# Patient Record
Sex: Male | Born: 1940
Health system: Southern US, Community
[De-identification: ages and names within clinical notes are randomized; demographics above are authoritative.]

## PROBLEM LIST (undated history)

## (undated) ENCOUNTER — Emergency Department (HOSPITAL_COMMUNITY): Payer: Medicare Other

## (undated) DIAGNOSIS — E119 Type 2 diabetes mellitus without complications: Secondary | ICD-10-CM

## (undated) DIAGNOSIS — I255 Ischemic cardiomyopathy: Secondary | ICD-10-CM

## (undated) DIAGNOSIS — Z951 Presence of aortocoronary bypass graft: Secondary | ICD-10-CM

## (undated) DIAGNOSIS — E785 Hyperlipidemia, unspecified: Secondary | ICD-10-CM

## (undated) DIAGNOSIS — I513 Intracardiac thrombosis, not elsewhere classified: Secondary | ICD-10-CM

## (undated) DIAGNOSIS — J449 Chronic obstructive pulmonary disease, unspecified: Secondary | ICD-10-CM

## (undated) DIAGNOSIS — I70229 Atherosclerosis of native arteries of extremities with rest pain, unspecified extremity: Secondary | ICD-10-CM

## (undated) DIAGNOSIS — I639 Cerebral infarction, unspecified: Secondary | ICD-10-CM

## (undated) DIAGNOSIS — I10 Essential (primary) hypertension: Secondary | ICD-10-CM

## (undated) DIAGNOSIS — D696 Thrombocytopenia, unspecified: Secondary | ICD-10-CM

## (undated) DIAGNOSIS — N200 Calculus of kidney: Secondary | ICD-10-CM

## (undated) DIAGNOSIS — I6529 Occlusion and stenosis of unspecified carotid artery: Secondary | ICD-10-CM

## (undated) DIAGNOSIS — I998 Other disorder of circulatory system: Secondary | ICD-10-CM

## (undated) DIAGNOSIS — R319 Hematuria, unspecified: Secondary | ICD-10-CM

## (undated) DIAGNOSIS — I236 Thrombosis of atrium, auricular appendage, and ventricle as current complications following acute myocardial infarction: Secondary | ICD-10-CM

## (undated) DIAGNOSIS — I2510 Atherosclerotic heart disease of native coronary artery without angina pectoris: Secondary | ICD-10-CM

## (undated) DIAGNOSIS — Z9889 Other specified postprocedural states: Secondary | ICD-10-CM

## (undated) DIAGNOSIS — I4891 Unspecified atrial fibrillation: Secondary | ICD-10-CM

## (undated) HISTORY — DX: Unspecified atrial fibrillation: I48.91

## (undated) HISTORY — DX: Presence of aortocoronary bypass graft: Z95.1

## (undated) HISTORY — DX: Occlusion and stenosis of unspecified carotid artery: I65.29

## (undated) HISTORY — DX: Atherosclerotic heart disease of native coronary artery without angina pectoris: I25.10

## (undated) HISTORY — DX: Atherosclerosis of native arteries of extremities with rest pain, unspecified extremity: I70.229

## (undated) HISTORY — PX: REPAIR THORACIC AORTA: SUR1211

## (undated) HISTORY — DX: Thrombocytopenia, unspecified: D69.6

## (undated) HISTORY — DX: Other disorder of circulatory system: I99.8

## (undated) HISTORY — DX: Chronic obstructive pulmonary disease, unspecified: J44.9

## (undated) HISTORY — DX: Hyperlipidemia, unspecified: E78.5

## (undated) HISTORY — DX: Ischemic cardiomyopathy: I25.5

## (undated) HISTORY — DX: Intracardiac thrombosis, not elsewhere classified: I51.3

---

## 1997-11-20 ENCOUNTER — Inpatient Hospital Stay (HOSPITAL_COMMUNITY)
Admission: AD | Admit: 1997-11-20 | Discharge: 1997-12-04 | Payer: Self-pay | Admitting: Physical Medicine & Rehabilitation

## 2000-01-01 ENCOUNTER — Encounter: Payer: Self-pay | Admitting: Emergency Medicine

## 2000-01-01 ENCOUNTER — Emergency Department (HOSPITAL_COMMUNITY): Admission: EM | Admit: 2000-01-01 | Discharge: 2000-01-01 | Payer: Self-pay | Admitting: Emergency Medicine

## 2004-05-01 ENCOUNTER — Emergency Department (HOSPITAL_COMMUNITY): Admission: EM | Admit: 2004-05-01 | Discharge: 2004-05-02 | Payer: Self-pay | Admitting: Emergency Medicine

## 2005-03-05 ENCOUNTER — Emergency Department (HOSPITAL_COMMUNITY): Admission: EM | Admit: 2005-03-05 | Discharge: 2005-03-05 | Payer: Self-pay | Admitting: Emergency Medicine

## 2007-04-10 ENCOUNTER — Emergency Department: Payer: Self-pay | Admitting: Emergency Medicine

## 2011-02-24 ENCOUNTER — Emergency Department (HOSPITAL_COMMUNITY): Payer: Medicare Other

## 2011-02-24 ENCOUNTER — Emergency Department (HOSPITAL_COMMUNITY)
Admission: EM | Admit: 2011-02-24 | Discharge: 2011-02-24 | Disposition: A | Discharge status: Home / Self Care | Payer: Medicare Other | Attending: Emergency Medicine | Admitting: Emergency Medicine

## 2011-02-24 DIAGNOSIS — R10819 Abdominal tenderness, unspecified site: Secondary | ICD-10-CM | POA: Insufficient documentation

## 2011-02-24 DIAGNOSIS — Z8673 Personal history of transient ischemic attack (TIA), and cerebral infarction without residual deficits: Secondary | ICD-10-CM | POA: Insufficient documentation

## 2011-02-24 DIAGNOSIS — R112 Nausea with vomiting, unspecified: Secondary | ICD-10-CM | POA: Insufficient documentation

## 2011-02-24 DIAGNOSIS — R109 Unspecified abdominal pain: Secondary | ICD-10-CM | POA: Insufficient documentation

## 2011-02-24 DIAGNOSIS — K209 Esophagitis, unspecified without bleeding: Secondary | ICD-10-CM | POA: Insufficient documentation

## 2011-02-24 DIAGNOSIS — N200 Calculus of kidney: Secondary | ICD-10-CM | POA: Insufficient documentation

## 2011-02-24 LAB — DIFFERENTIAL
Basophils Absolute: 0 K/uL (ref 0.0–0.1)
Basophils Relative: 1 % (ref 0–1)
Eosinophils Absolute: 0.3 10*3/uL (ref 0.0–0.7)
Eosinophils Relative: 3 % (ref 0–5)
Lymphocytes Relative: 22 % (ref 12–46)
Lymphs Abs: 2 10*3/uL (ref 0.7–4.0)
Monocytes Absolute: 0.5 K/uL (ref 0.1–1.0)
Monocytes Relative: 6 % (ref 3–12)
Neutro Abs: 5.9 10*3/uL (ref 1.7–7.7)
Neutrophils Relative %: 68 % (ref 43–77)

## 2011-02-24 LAB — LIPASE, BLOOD: Lipase: 34 U/L (ref 11–59)

## 2011-02-24 LAB — COMPREHENSIVE METABOLIC PANEL
ALT: 24 U/L (ref 0–53)
AST: 23 U/L (ref 0–37)
Alkaline Phosphatase: 81 U/L (ref 39–117)
CO2: 27 mEq/L (ref 19–32)
Calcium: 9.8 mg/dL (ref 8.4–10.5)
GFR calc Af Amer: >60 mL/min (ref >60)
GFR calc non Af Amer: >60 mL/min (ref >60)
Glucose, Bld: 105 mg/dL — ABNORMAL HIGH (ref 70–99)
Potassium: 4.5 mEq/L (ref 3.5–5.1)
Sodium: 138 mEq/L (ref 135–145)

## 2011-02-24 LAB — CBC
HCT: 46.1 % (ref 39.0–52.0)
Hemoglobin: 16.6 g/dL (ref 13.0–17.0)
MCH: 33.9 pg (ref 26.0–34.0)
MCHC: 36 g/dL (ref 30.0–36.0)
MCV: 94.3 fL (ref 78.0–100.0)
Platelets: 158 10*3/uL (ref 150–400)
RBC: 4.89 MIL/uL (ref 4.22–5.81)
RDW: 13.5 % (ref 11.5–15.5)
WBC: 8.7 10*3/uL (ref 4.0–10.5)

## 2011-02-24 LAB — URINALYSIS, ROUTINE W REFLEX MICROSCOPIC
Glucose, UA: NEGATIVE mg/dL
Ketones, ur: NEGATIVE mg/dL
Protein, ur: >300 mg/dL — AB
Urobilinogen, UA: 0.2 mg/dL (ref 0.0–1.0)

## 2011-02-24 LAB — COMPREHENSIVE METABOLIC PANEL WITH GFR
Albumin: 4.2 g/dL (ref 3.5–5.2)
BUN: 19 mg/dL (ref 6–23)
Chloride: 99 meq/L (ref 96–112)
Creatinine, Ser: 1.17 mg/dL (ref 0.50–1.35)
Total Bilirubin: 0.6 mg/dL (ref 0.3–1.2)
Total Protein: 7.3 g/dL (ref 6.0–8.3)

## 2011-02-24 LAB — URINE MICROSCOPIC-ADD ON

## 2011-02-24 LAB — PROTIME-INR
INR: 1.01 (ref 0.00–1.49)
Prothrombin Time: 13.5 s (ref 11.6–15.2)

## 2011-02-24 LAB — APTT: aPTT: 22 seconds — ABNORMAL LOW (ref 24–37)

## 2011-02-24 MED ORDER — IOHEXOL 300 MG/ML  SOLN
80.0000 mL | Freq: Once | INTRAMUSCULAR | Status: AC | PRN
  Administered 2011-02-24: 80 mL via INTRAVENOUS

## 2012-01-13 ENCOUNTER — Emergency Department (HOSPITAL_COMMUNITY): Payer: Medicare Other

## 2012-01-13 ENCOUNTER — Encounter (HOSPITAL_COMMUNITY): Payer: Self-pay | Admitting: Emergency Medicine

## 2012-01-13 ENCOUNTER — Inpatient Hospital Stay (HOSPITAL_COMMUNITY)
Admission: EM | Admit: 2012-01-13 | Discharge: 2012-01-29 | DRG: 234 | Disposition: A | Discharge status: Skilled Nursing Facility | Payer: Medicare Other | Attending: Cardiothoracic Surgery | Admitting: Cardiothoracic Surgery

## 2012-01-13 DIAGNOSIS — I214 Non-ST elevation (NSTEMI) myocardial infarction: Principal | ICD-10-CM

## 2012-01-13 DIAGNOSIS — N200 Calculus of kidney: Secondary | ICD-10-CM | POA: Diagnosis present

## 2012-01-13 DIAGNOSIS — R1013 Epigastric pain: Secondary | ICD-10-CM | POA: Diagnosis not present

## 2012-01-13 DIAGNOSIS — Z720 Tobacco use: Secondary | ICD-10-CM

## 2012-01-13 DIAGNOSIS — Z951 Presence of aortocoronary bypass graft: Secondary | ICD-10-CM

## 2012-01-13 DIAGNOSIS — E875 Hyperkalemia: Secondary | ICD-10-CM | POA: Diagnosis present

## 2012-01-13 DIAGNOSIS — R079 Chest pain, unspecified: Secondary | ICD-10-CM | POA: Diagnosis not present

## 2012-01-13 DIAGNOSIS — Z9889 Other specified postprocedural states: Secondary | ICD-10-CM

## 2012-01-13 DIAGNOSIS — Z7982 Long term (current) use of aspirin: Secondary | ICD-10-CM

## 2012-01-13 DIAGNOSIS — I236 Thrombosis of atrium, auricular appendage, and ventricle as current complications following acute myocardial infarction: Secondary | ICD-10-CM | POA: Diagnosis present

## 2012-01-13 DIAGNOSIS — I255 Ischemic cardiomyopathy: Secondary | ICD-10-CM | POA: Diagnosis present

## 2012-01-13 DIAGNOSIS — I658 Occlusion and stenosis of other precerebral arteries: Secondary | ICD-10-CM | POA: Diagnosis present

## 2012-01-13 DIAGNOSIS — Z8673 Personal history of transient ischemic attack (TIA), and cerebral infarction without residual deficits: Secondary | ICD-10-CM

## 2012-01-13 DIAGNOSIS — J4489 Other specified chronic obstructive pulmonary disease: Secondary | ICD-10-CM | POA: Diagnosis present

## 2012-01-13 DIAGNOSIS — D696 Thrombocytopenia, unspecified: Secondary | ICD-10-CM | POA: Diagnosis present

## 2012-01-13 DIAGNOSIS — Z23 Encounter for immunization: Secondary | ICD-10-CM

## 2012-01-13 DIAGNOSIS — I10 Essential (primary) hypertension: Secondary | ICD-10-CM | POA: Diagnosis not present

## 2012-01-13 DIAGNOSIS — F17200 Nicotine dependence, unspecified, uncomplicated: Secondary | ICD-10-CM | POA: Diagnosis present

## 2012-01-13 DIAGNOSIS — N289 Disorder of kidney and ureter, unspecified: Secondary | ICD-10-CM | POA: Diagnosis present

## 2012-01-13 DIAGNOSIS — R7989 Other specified abnormal findings of blood chemistry: Secondary | ICD-10-CM | POA: Diagnosis not present

## 2012-01-13 DIAGNOSIS — R739 Hyperglycemia, unspecified: Secondary | ICD-10-CM

## 2012-01-13 DIAGNOSIS — I6350 Cerebral infarction due to unspecified occlusion or stenosis of unspecified cerebral artery: Secondary | ICD-10-CM | POA: Diagnosis not present

## 2012-01-13 DIAGNOSIS — I2510 Atherosclerotic heart disease of native coronary artery without angina pectoris: Secondary | ICD-10-CM

## 2012-01-13 DIAGNOSIS — J449 Chronic obstructive pulmonary disease, unspecified: Secondary | ICD-10-CM | POA: Diagnosis present

## 2012-01-13 DIAGNOSIS — E119 Type 2 diabetes mellitus without complications: Secondary | ICD-10-CM | POA: Diagnosis present

## 2012-01-13 DIAGNOSIS — I2589 Other forms of chronic ischemic heart disease: Secondary | ICD-10-CM | POA: Diagnosis present

## 2012-01-13 DIAGNOSIS — R5381 Other malaise: Secondary | ICD-10-CM | POA: Diagnosis present

## 2012-01-13 DIAGNOSIS — I6529 Occlusion and stenosis of unspecified carotid artery: Secondary | ICD-10-CM | POA: Diagnosis present

## 2012-01-13 DIAGNOSIS — J811 Chronic pulmonary edema: Secondary | ICD-10-CM | POA: Diagnosis not present

## 2012-01-13 HISTORY — DX: Type 2 diabetes mellitus without complications: E11.9

## 2012-01-13 HISTORY — DX: Hematuria, unspecified: R31.9

## 2012-01-13 HISTORY — DX: Cerebral infarction, unspecified: I63.9

## 2012-01-13 HISTORY — DX: Essential (primary) hypertension: I10

## 2012-01-13 HISTORY — DX: Calculus of kidney: N20.0

## 2012-01-13 HISTORY — DX: Other specified postprocedural states: Z98.890

## 2012-01-13 HISTORY — PX: CAROTID ENDARTERECTOMY: SUR193

## 2012-01-13 HISTORY — DX: Thrombosis of atrium, auricular appendage, and ventricle as current complications following acute myocardial infarction: I23.6

## 2012-01-13 LAB — COMPREHENSIVE METABOLIC PANEL
Alkaline Phosphatase: 74 U/L (ref 39–117)
BUN: 18 mg/dL (ref 6–23)
Chloride: 103 mEq/L (ref 96–112)
Creatinine, Ser: 1.09 mg/dL (ref 0.50–1.35)
GFR calc Af Amer: 77 mL/min — ABNORMAL LOW (ref >90)
Glucose, Bld: 157 mg/dL — ABNORMAL HIGH (ref 70–99)
Potassium: 4.1 mEq/L (ref 3.5–5.1)
Total Bilirubin: 0.4 mg/dL (ref 0.3–1.2)
Total Protein: 7.4 g/dL (ref 6.0–8.3)

## 2012-01-13 LAB — CBC
Platelets: 143 10*3/uL — ABNORMAL LOW (ref 150–400)
RBC: 4.68 MIL/uL (ref 4.22–5.81)
RDW: 14 % (ref 11.5–15.5)
WBC: 7.4 10*3/uL (ref 4.0–10.5)

## 2012-01-13 LAB — POCT I-STAT TROPONIN I

## 2012-01-13 MED ORDER — HEPARIN (PORCINE) IN NACL 100-0.45 UNIT/ML-% IJ SOLN
1200.0000 [IU]/h | INTRAMUSCULAR | Status: DC
  Administered 2012-01-13: 700 [IU]/h via INTRAVENOUS
  Administered 2012-01-14: 1000 [IU]/h via INTRAVENOUS
  Filled 2012-01-13 (×3): qty 250

## 2012-01-13 MED ORDER — NITROGLYCERIN 0.4 MG SL SUBL
0.4000 mg | SUBLINGUAL_TABLET | SUBLINGUAL | Status: DC | PRN
  Administered 2012-01-13 – 2012-01-14 (×2): 0.4 mg via SUBLINGUAL
  Filled 2012-01-13: qty 25

## 2012-01-13 MED ORDER — HEPARIN BOLUS VIA INFUSION
3000.0000 [IU] | Freq: Once | INTRAVENOUS | Status: AC
  Administered 2012-01-13: 3000 [IU] via INTRAVENOUS

## 2012-01-13 NOTE — ED Notes (Addendum)
Patient complaining of intermittent chest pain that started three days ago; reports shortness of breath.  Denies nausea, vomiting, diaphoresis, and other associated symptoms.  Patient reports a "heartburn" type feeling in the left side of his chest and up his esophogeal area. Patient reports taking four aspirin tablets (patient unsure of dose of each).  Patient has history of recurrent angina; states that taking aspirin usually takes his pain away.

## 2012-01-13 NOTE — Progress Notes (Signed)
ANTICOAGULATION CONSULT NOTE - Initial Consult  Pharmacy Consult for heparin Indication: chest pain/ACS  No Known Allergies  Patient Measurements: Height: 5\' 6"  (167.6 cm) Weight: 130 lb (58.968 kg) (Pt estimate) IBW/kg (Calculated) : 63.8   Vital Signs: Temp: 98.5 F (36.9 C) (07/20 1954) BP: 152/90 mmHg (07/20 1954) Pulse Rate: 102  (07/20 1954)  Labs:  Basename 01/13/12 2030  HGB 15.7  HCT 44.0  PLT 143*  APTT --  LABPROT --  INR --  HEPARINUNFRC --  CREATININE 1.09  CKTOTAL --  CKMB --  TROPONINI --    Estimated Creatinine Clearance: 52.6 ml/min (by C-G formula based on Cr of 1.09).   Medical History: Past Medical History  Diagnosis Date  . Hypertension   . Diabetes mellitus   . H/O ascending aorta repair   . Stroke     Assessment: 71yo male c/o on-and-off CP x3d, worse today, with i-stat troponin mildly elevated, to begin heparin.  Goal of Therapy:  Heparin level 0.3-0.7 units/ml Monitor platelets by anticoagulation protocol: Yes   Plan:  Will give heparin bolus of 3000 units x1 followed by gtt at 700 units/hr and monitor heparin levels and CBC.  Colleen Can PharmD BCPS 01/13/2012,10:44 PM

## 2012-01-13 NOTE — ED Provider Notes (Signed)
History     CSN: [id number]  Arrival date & time 01/13/12  1951   First MD Initiated Contact with Patient 01/13/12 2051      Chief Complaint  Patient presents with  . Chest Pain    (Consider location/radiation/quality/duration/timing/severity/associated sxs/prior treatment) Patient is a 71 y.o. male presenting with chest pain. The history is provided by the patient and a relative.  Chest Pain The chest pain began 3 - 5 days ago. Chest pain occurs intermittently. The chest pain is worsening. At its most intense, the pain is at 9/10. The pain is currently at 5/10. The quality of the pain is described as burning. The pain does not radiate. Primary symptoms include abdominal pain. Pertinent negatives for primary symptoms include no fever, no fatigue, no shortness of breath, no cough, no palpitations, no nausea and no vomiting.  Pertinent negatives for associated symptoms include no claudication, no lower extremity edema, no near-syncope and no weakness. He tried aspirin and antacids for the symptoms. Risk factors include male gender, being elderly and smoking/tobacco exposure.  His past medical history is significant for aneurysm, diabetes and hypertension.  Pertinent negatives for past medical history include no CAD and no MI.     Past Medical History  Diagnosis Date  . Hypertension   . Diabetes mellitus   . H/O ascending aorta repair   . Stroke     Past Surgical History  Procedure Date  . Repair thoracic aorta     History reviewed. No pertinent family history.  History  Substance Use Topics  . Smoking status: Current Everyday Smoker -- 1.0 packs/day  . Smokeless tobacco: Not on file  . Alcohol Use: No      Review of Systems  Unable to perform ROS Constitutional: Negative for fever, chills and fatigue.  Respiratory: Negative for cough and shortness of breath.   Cardiovascular: Positive for chest pain. Negative for palpitations, claudication and near-syncope.    Gastrointestinal: Positive for abdominal pain. Negative for nausea and vomiting.  Musculoskeletal: Negative for myalgias and arthralgias.  Skin: Negative for color change and rash.  Neurological: Negative for weakness, light-headedness and headaches.  All other systems reviewed and are negative.    Allergies  Review of patient's allergies indicates no known allergies.  Home Medications   Current Outpatient Rx  Name Route Sig Dispense Refill  . ASPIRIN 325 MG PO TABS Oral Take 325 mg by mouth daily.      BP 124/74  Pulse 80  Temp 98.5 F (36.9 C)  Resp 22  Ht 5\' 6"  (1.676 m)  Wt 130 lb (58.968 kg)  BMI 20.98 kg/m2  SpO2 97%  Physical Exam  Nursing note and vitals reviewed. Constitutional: He is oriented to person, place, and time. He appears well-developed and well-nourished.  HENT:  Head: Normocephalic and atraumatic.  Eyes: EOM are normal. Pupils are equal, round, and reactive to light.  Cardiovascular: Normal rate and regular rhythm.   Pulmonary/Chest: Effort normal and breath sounds normal. No respiratory distress. He exhibits no tenderness.  Abdominal: Soft. There is tenderness (moderate, epigastric).  Musculoskeletal: He exhibits no edema.  Neurological: He is alert and oriented to person, place, and time.  Skin: Skin is warm and dry.  Psychiatric: He has a normal mood and affect.    ED Course  Procedures (including critical care time)  Labs Reviewed  COMPREHENSIVE METABOLIC PANEL - Abnormal; Notable for the following:    Glucose, Bld 157 (*)     GFR calc non Af  Amer 67 (*)     GFR calc Af Amer 77 (*)     All other components within normal limits  CBC - Abnormal; Notable for the following:    Platelets 143 (*)     All other components within normal limits  POCT I-STAT TROPONIN I - Abnormal; Notable for the following:    Troponin i, poc 0.12 (*)     All other components within normal limits  HEPARIN LEVEL (UNFRACTIONATED)  CBC  CARDIAC PANEL(CRET  KIN+CKTOT+MB+TROPI)   Dg Chest 2 View  01/13/2012  *RADIOLOGY REPORT*  Clinical Data: Chest pain today.  Hypertension, diabetes.  History of smoking, stroke.  CHEST - 2 VIEW  Comparison: 02/24/2011  Findings: Heart is mildly enlarged.  There is increased interstitial prominence, most consistent with interstitial edema. No focal consolidations or pleural effusions are identified. Degenerative changes are seen in the spine.  IMPRESSION: Cardiomegaly and interstitial pulmonary edema.  Original Report Authenticated By: Patterson Hammersmith, M.D.    Date: 01/13/2012  Rate: 101  Rhythm: sinus tachycardia  QRS Axis: normal  Intervals: normal  ST/T Wave abnormalities: nonspecific ST changes and nonspecific T wave changes mild ST depression in II, V6, TWI II, V5-V6  Conduction Disutrbances:none  Narrative Interpretation:   Old EKG Reviewed: changes noted new nonspecific ST and T wave changes (02/23/11)    1. NSTEMI (non-ST elevated myocardial infarction)   2. Elevated troponin   3. Epigastric pain       MDM  This is a 71 year old male who presents today with to 3 days of intermittent chest pain. When asked the location of the pain, the patient points to his epigastric and periumbilical area. He describes it as a burning pain, but occasionally radiates up into his throat, and has been relieved by both aspirin and over-the-counter antacids. He has not previously had similar pain before. He denies any shortness of breath, nausea or vomiting or any other associated complaints. These episodes last anywhere from several minutes to several hours, and do not always require medications to have them subside. He says that over the course of a couple of days the pain has worsened in intensity, and that day the pain was not relieved despite taking multiple aspirin; he took anywhere between 4 and 8 aspirin today of unknown strength. On exam the patient does have moderate epigastric tenderness, exam is otherwise  unremarkable. EKG shows nonspecific changes that are new from his previous EKG, and his troponin is mildly elevated. Discussed the patient with [facility] cardiology, and per their recommendations will admit the patient to the cardiology service, and will start heparin for anticoagulation. The patient and his son were updated with the findings and the plan of care. They expressed understanding.        Theotis Burrow, MD 01/14/12 [phone]

## 2012-01-13 NOTE — ED Notes (Signed)
Resident at bedside.  

## 2012-01-13 NOTE — ED Notes (Signed)
Pt states that he has been having CP since Wed. Or Thursday (unable to state exact day just a few days ago.) Pain is in lower chest/upper abdomen and feels like a burning pain. Pt states that he has had acid reflux in the past but no medications rxed. Pt states that he had a CVA in 1999, states that he had vision changes with CVA. Pt states a week ago also had similar vision changes while driving to PCP for check up. Pt denies vision changes at this time. Pt just has burning in upper central abdominal region.

## 2012-01-13 NOTE — ED Provider Notes (Signed)
I saw and evaluated the patient, reviewed the resident's note and I agree with the findings and plan.  Agree with EKG interpretation  Pt with chest pain off and on for several days, more severe today. Has positive Trop. Pain free now.   Charles B. Bernette Mayers, MD 01/13/12 2232

## 2012-01-14 ENCOUNTER — Encounter (HOSPITAL_COMMUNITY): Payer: Self-pay | Admitting: Internal Medicine

## 2012-01-14 DIAGNOSIS — J9819 Other pulmonary collapse: Secondary | ICD-10-CM | POA: Diagnosis not present

## 2012-01-14 DIAGNOSIS — R7309 Other abnormal glucose: Secondary | ICD-10-CM

## 2012-01-14 DIAGNOSIS — Z951 Presence of aortocoronary bypass graft: Secondary | ICD-10-CM | POA: Diagnosis not present

## 2012-01-14 DIAGNOSIS — I2589 Other forms of chronic ischemic heart disease: Secondary | ICD-10-CM | POA: Diagnosis present

## 2012-01-14 DIAGNOSIS — I6789 Other cerebrovascular disease: Secondary | ICD-10-CM | POA: Diagnosis not present

## 2012-01-14 DIAGNOSIS — I509 Heart failure, unspecified: Secondary | ICD-10-CM | POA: Diagnosis not present

## 2012-01-14 DIAGNOSIS — R0989 Other specified symptoms and signs involving the circulatory and respiratory systems: Secondary | ICD-10-CM | POA: Diagnosis not present

## 2012-01-14 DIAGNOSIS — Z01810 Encounter for preprocedural cardiovascular examination: Secondary | ICD-10-CM | POA: Diagnosis not present

## 2012-01-14 DIAGNOSIS — D696 Thrombocytopenia, unspecified: Secondary | ICD-10-CM | POA: Diagnosis present

## 2012-01-14 DIAGNOSIS — I442 Atrioventricular block, complete: Secondary | ICD-10-CM | POA: Diagnosis not present

## 2012-01-14 DIAGNOSIS — I214 Non-ST elevation (NSTEMI) myocardial infarction: Secondary | ICD-10-CM | POA: Diagnosis present

## 2012-01-14 DIAGNOSIS — J4489 Other specified chronic obstructive pulmonary disease: Secondary | ICD-10-CM | POA: Diagnosis not present

## 2012-01-14 DIAGNOSIS — I6529 Occlusion and stenosis of unspecified carotid artery: Secondary | ICD-10-CM | POA: Diagnosis not present

## 2012-01-14 DIAGNOSIS — R079 Chest pain, unspecified: Secondary | ICD-10-CM | POA: Diagnosis not present

## 2012-01-14 DIAGNOSIS — R739 Hyperglycemia, unspecified: Secondary | ICD-10-CM | POA: Insufficient documentation

## 2012-01-14 DIAGNOSIS — Z23 Encounter for immunization: Secondary | ICD-10-CM | POA: Diagnosis not present

## 2012-01-14 DIAGNOSIS — F17200 Nicotine dependence, unspecified, uncomplicated: Secondary | ICD-10-CM | POA: Diagnosis not present

## 2012-01-14 DIAGNOSIS — I428 Other cardiomyopathies: Secondary | ICD-10-CM | POA: Diagnosis not present

## 2012-01-14 DIAGNOSIS — R5381 Other malaise: Secondary | ICD-10-CM | POA: Diagnosis present

## 2012-01-14 DIAGNOSIS — I6509 Occlusion and stenosis of unspecified vertebral artery: Secondary | ICD-10-CM | POA: Diagnosis not present

## 2012-01-14 DIAGNOSIS — R1013 Epigastric pain: Secondary | ICD-10-CM | POA: Diagnosis not present

## 2012-01-14 DIAGNOSIS — R319 Hematuria, unspecified: Secondary | ICD-10-CM | POA: Diagnosis not present

## 2012-01-14 DIAGNOSIS — Z7982 Long term (current) use of aspirin: Secondary | ICD-10-CM | POA: Diagnosis not present

## 2012-01-14 DIAGNOSIS — I6350 Cerebral infarction due to unspecified occlusion or stenosis of unspecified cerebral artery: Secondary | ICD-10-CM | POA: Diagnosis not present

## 2012-01-14 DIAGNOSIS — J90 Pleural effusion, not elsewhere classified: Secondary | ICD-10-CM | POA: Diagnosis not present

## 2012-01-14 DIAGNOSIS — N289 Disorder of kidney and ureter, unspecified: Secondary | ICD-10-CM | POA: Diagnosis present

## 2012-01-14 DIAGNOSIS — N200 Calculus of kidney: Secondary | ICD-10-CM | POA: Diagnosis present

## 2012-01-14 DIAGNOSIS — I517 Cardiomegaly: Secondary | ICD-10-CM | POA: Diagnosis not present

## 2012-01-14 DIAGNOSIS — J8410 Pulmonary fibrosis, unspecified: Secondary | ICD-10-CM | POA: Diagnosis not present

## 2012-01-14 DIAGNOSIS — I2510 Atherosclerotic heart disease of native coronary artery without angina pectoris: Secondary | ICD-10-CM | POA: Diagnosis not present

## 2012-01-14 DIAGNOSIS — I10 Essential (primary) hypertension: Secondary | ICD-10-CM

## 2012-01-14 DIAGNOSIS — E119 Type 2 diabetes mellitus without complications: Secondary | ICD-10-CM | POA: Diagnosis not present

## 2012-01-14 DIAGNOSIS — E875 Hyperkalemia: Secondary | ICD-10-CM | POA: Diagnosis present

## 2012-01-14 DIAGNOSIS — I658 Occlusion and stenosis of other precerebral arteries: Secondary | ICD-10-CM | POA: Diagnosis not present

## 2012-01-14 DIAGNOSIS — I059 Rheumatic mitral valve disease, unspecified: Secondary | ICD-10-CM | POA: Diagnosis not present

## 2012-01-14 DIAGNOSIS — R109 Unspecified abdominal pain: Secondary | ICD-10-CM | POA: Diagnosis not present

## 2012-01-14 DIAGNOSIS — R918 Other nonspecific abnormal finding of lung field: Secondary | ICD-10-CM | POA: Diagnosis not present

## 2012-01-14 DIAGNOSIS — J984 Other disorders of lung: Secondary | ICD-10-CM | POA: Diagnosis not present

## 2012-01-14 DIAGNOSIS — R7989 Other specified abnormal findings of blood chemistry: Secondary | ICD-10-CM | POA: Diagnosis not present

## 2012-01-14 DIAGNOSIS — J811 Chronic pulmonary edema: Secondary | ICD-10-CM | POA: Diagnosis not present

## 2012-01-14 DIAGNOSIS — R310 Gross hematuria: Secondary | ICD-10-CM | POA: Diagnosis not present

## 2012-01-14 DIAGNOSIS — I359 Nonrheumatic aortic valve disorder, unspecified: Secondary | ICD-10-CM | POA: Diagnosis not present

## 2012-01-14 DIAGNOSIS — Z720 Tobacco use: Secondary | ICD-10-CM | POA: Diagnosis present

## 2012-01-14 DIAGNOSIS — J449 Chronic obstructive pulmonary disease, unspecified: Secondary | ICD-10-CM | POA: Diagnosis not present

## 2012-01-14 DIAGNOSIS — Z8673 Personal history of transient ischemic attack (TIA), and cerebral infarction without residual deficits: Secondary | ICD-10-CM | POA: Diagnosis not present

## 2012-01-14 LAB — BASIC METABOLIC PANEL
BUN: 15 mg/dL (ref 6–23)
CO2: 21 mEq/L (ref 19–32)
Chloride: 107 mEq/L (ref 96–112)
Creatinine, Ser: 1.12 mg/dL (ref 0.50–1.35)
Glucose, Bld: 128 mg/dL — ABNORMAL HIGH (ref 70–99)

## 2012-01-14 LAB — CBC
HCT: 40.3 % (ref 39.0–52.0)
MCH: 33.6 pg (ref 26.0–34.0)
MCHC: 35.5 g/dL (ref 30.0–36.0)
MCHC: 35.9 g/dL (ref 30.0–36.0)
MCV: 93.7 fL (ref 78.0–100.0)
Platelets: 128 10*3/uL — ABNORMAL LOW (ref 150–400)
RDW: 13.9 % (ref 11.5–15.5)
RDW: 13.9 % (ref 11.5–15.5)

## 2012-01-14 LAB — GLUCOSE, CAPILLARY: Glucose-Capillary: 112 mg/dL — ABNORMAL HIGH (ref 70–99)

## 2012-01-14 LAB — HEPARIN LEVEL (UNFRACTIONATED)
Heparin Unfractionated: 0.14 IU/mL — ABNORMAL LOW (ref 0.30–0.70)
Heparin Unfractionated: 0.21 IU/mL — ABNORMAL LOW (ref 0.30–0.70)

## 2012-01-14 LAB — CARDIAC PANEL(CRET KIN+CKTOT+MB+TROPI)
CK, MB: 9.3 ng/mL (ref 0.3–4.0)
Relative Index: 6.9 — ABNORMAL HIGH (ref 0.0–2.5)
Relative Index: 8.9 — ABNORMAL HIGH (ref 0.0–2.5)
Total CK: 112 U/L (ref 7–232)
Total CK: 105 U/L (ref 7–232)
Total CK: 80 U/L (ref 7–232)
Troponin I: 0.82 ng/mL (ref <0.30)

## 2012-01-14 LAB — LIPID PANEL
LDL Cholesterol: 62 mg/dL (ref 0–99)
Total CHOL/HDL Ratio: 4.7 RATIO
VLDL: 49 mg/dL — ABNORMAL HIGH (ref 0–40)

## 2012-01-14 MED ORDER — IPRATROPIUM BROMIDE 0.02 % IN SOLN
0.5000 mg | Freq: Four times a day (QID) | RESPIRATORY_TRACT | Status: DC
  Administered 2012-01-14 – 2012-01-15 (×5): 0.5 mg via RESPIRATORY_TRACT
  Filled 2012-01-14 (×5): qty 2.5

## 2012-01-14 MED ORDER — SODIUM CHLORIDE 0.9 % IV SOLN: 250.0000 mL | INTRAVENOUS | Status: DC | PRN

## 2012-01-14 MED ORDER — PNEUMOCOCCAL VAC POLYVALENT 25 MCG/0.5ML IJ INJ
0.5000 mL | INJECTION | INTRAMUSCULAR | Status: AC
  Administered 2012-01-15: 0.5 mL via INTRAMUSCULAR
  Filled 2012-01-14: qty 0.5

## 2012-01-14 MED ORDER — ONDANSETRON HCL 4 MG PO TABS: 4.0000 mg | ORAL_TABLET | Freq: Four times a day (QID) | ORAL | Status: DC | PRN

## 2012-01-14 MED ORDER — ASPIRIN 325 MG PO TABS
325.0000 mg | ORAL_TABLET | Freq: Every day | ORAL | Status: DC
  Administered 2012-01-14 – 2012-01-21 (×7): 325 mg via ORAL
  Filled 2012-01-14 (×9): qty 1

## 2012-01-14 MED ORDER — ONDANSETRON HCL 4 MG/2ML IJ SOLN: 4.0000 mg | Freq: Four times a day (QID) | INTRAMUSCULAR | Status: DC | PRN

## 2012-01-14 MED ORDER — ALBUTEROL SULFATE (5 MG/ML) 0.5% IN NEBU: 2.5000 mg | INHALATION_SOLUTION | RESPIRATORY_TRACT | Status: DC | PRN

## 2012-01-14 MED ORDER — CARVEDILOL 6.25 MG PO TABS
6.2500 mg | ORAL_TABLET | ORAL | Status: AC
  Administered 2012-01-14: 6.25 mg via ORAL
  Filled 2012-01-14: qty 1

## 2012-01-14 MED ORDER — SODIUM CHLORIDE 0.9 % IJ SOLN
3.0000 mL | Freq: Two times a day (BID) | INTRAMUSCULAR | Status: DC
  Administered 2012-01-14 – 2012-01-15 (×2): 3 mL via INTRAVENOUS

## 2012-01-14 MED ORDER — NITROGLYCERIN IN D5W 200-5 MCG/ML-% IV SOLN
2.0000 ug/min | INTRAVENOUS | Status: DC
  Administered 2012-01-14: 5 ug/min via INTRAVENOUS
  Filled 2012-01-14: qty 250

## 2012-01-14 MED ORDER — CARVEDILOL 6.25 MG PO TABS
6.2500 mg | ORAL_TABLET | Freq: Two times a day (BID) | ORAL | Status: DC
  Administered 2012-01-15 – 2012-01-21 (×12): 6.25 mg via ORAL
  Filled 2012-01-14 (×19): qty 1

## 2012-01-14 MED ORDER — NICOTINE 21 MG/24HR TD PT24
21.0000 mg | MEDICATED_PATCH | Freq: Every day | TRANSDERMAL | Status: DC
  Administered 2012-01-14 – 2012-01-21 (×8): 21 mg via TRANSDERMAL
  Filled 2012-01-14 (×9): qty 1

## 2012-01-14 MED ORDER — MORPHINE SULFATE 2 MG/ML IJ SOLN
2.0000 mg | INTRAMUSCULAR | Status: DC | PRN
  Administered 2012-01-17 – 2012-01-20 (×4): 2 mg via INTRAVENOUS
  Filled 2012-01-14 (×4): qty 1

## 2012-01-14 MED ORDER — ACETAMINOPHEN 650 MG RE SUPP: 650.0000 mg | Freq: Four times a day (QID) | RECTAL | Status: DC | PRN

## 2012-01-14 MED ORDER — ALBUTEROL SULFATE (5 MG/ML) 0.5% IN NEBU
2.5000 mg | INHALATION_SOLUTION | Freq: Four times a day (QID) | RESPIRATORY_TRACT | Status: DC
  Administered 2012-01-14 – 2012-01-15 (×5): 2.5 mg via RESPIRATORY_TRACT
  Filled 2012-01-14 (×5): qty 0.5

## 2012-01-14 MED ORDER — LISINOPRIL 2.5 MG PO TABS
2.5000 mg | ORAL_TABLET | Freq: Every day | ORAL | Status: DC
  Administered 2012-01-14 – 2012-01-15 (×2): 2.5 mg via ORAL
  Filled 2012-01-14 (×5): qty 1

## 2012-01-14 MED ORDER — HEPARIN BOLUS VIA INFUSION
2000.0000 [IU] | Freq: Once | INTRAVENOUS | Status: AC
  Administered 2012-01-14: 2000 [IU] via INTRAVENOUS
  Filled 2012-01-14: qty 2000

## 2012-01-14 MED ORDER — ACETAMINOPHEN 325 MG PO TABS
650.0000 mg | ORAL_TABLET | Freq: Four times a day (QID) | ORAL | Status: DC | PRN
  Administered 2012-01-15: 650 mg via ORAL
  Filled 2012-01-14: qty 2

## 2012-01-14 MED ORDER — SODIUM CHLORIDE 0.9 % IJ SOLN: 3.0000 mL | INTRAMUSCULAR | Status: DC | PRN

## 2012-01-14 NOTE — ED Notes (Signed)
Admitting MD at bedside. Stepdown orders being placed.

## 2012-01-14 NOTE — Consult Note (Signed)
[facility]       CONSULTATION NOTE   Reason for Consult: Unstable angina, probably postinfarction  Requesting Physician: Dr. Mikeal Hawthorne  Cardiologist: He has not seen a cardiologist in over 10 years  HPI: This is a 71 y.o. male with a past medical history significant for coronary artery disease, stroke and peripheral arterial disease. Reportedly he had a stent placed in the coronary arteries around the year 1999 at which time the ulcer problems with severe intermittent claudication and a recent stroke. At some point she underwent aortobiiliac bypass for peripheral field disease.  He has been sick now for at least 4 weeks, during which time he has experienced recurrent chest pain radiating to the upper abdomen, severe shortness of breath on exertion, near syncope on exertion. She also had some severe nausea and vomiting roughly 2 weeks ago, possibly with some hematemesis. He has refused to seek medical care despite his son is recommendation until his condition worsened over the last 24 hours. He has had severe chest discomfort today that is immediately relieved by sublingual nitroglycerin but  quickly recurs. After initiation of intravenous nitroglycerin drip he has been chest pain-free.  He has a very slight increase in his cardiac troponin I and CK-MB. His EKG is quite abnormal and suggestive of a completed interval myocardial infarction, new when compared with a previous EKG from August of 2012. There is also minor ST segment depression and T-wave inversion in the lateral leads.  As far as I can tell he was not taking any cardiac medications prior to this admission. He had tried taking some over-the-counter acid relief medicines without much benefit. She has not had any medical followup to speak of in many years. An emergency room visit last August was prompted by abdominal pain nausea and vomiting and then to the diagnosis of a left kidney staghorn calculus.  Incidental note was made at the time of occlusion of the left iliac limb of his aortofemoral bypass. PMHx:  See above  FAMHx: History reviewed. No pertinent family history.  SOCHx:  reports that he has been smoking.  He does not have any smokeless tobacco history on file. He reports that he does not drink alcohol or use illicit drugs.  ALLERGIES: No Known Allergies  ROS: Constitutional: positive for fatigue, negative for anorexia, chills, fevers and night sweats Ears, nose, mouth, throat, and face: negative Respiratory: positive for chronic bronchitis, cough, dyspnea on exertion, sputum and wheezing, negative for pleurisy/chest pain Cardiovascular: positive for chest pressure/discomfort and near-syncope, negative for orthopnea, palpitations, paroxysmal nocturnal dyspnea and syncope Gastrointestinal: positive for constipation, nausea and vomiting, negative for jaundice, melena and odynophagia Genitourinary:negative Integument/breast: negative Hematologic/lymphatic: negative for bleeding and easy bruising Musculoskeletal:positive for arthralgias and back pain Neurological: positive for weakness and residual right-sided weakness Behavioral/Psych: negative Endocrine: negative Allergic/Immunologic: negative  HOME MEDICATIONS: Prescriptions prior to admission  Medication Sig Dispense Refill  . aspirin 325 MG tablet Take 325 mg by mouth daily.        HOSPITAL MEDICATIONS: Prior to Admission:  Prescriptions prior to admission  Medication Sig Dispense Refill  . aspirin 325 MG tablet Take 325 mg by mouth daily.        VITALS: Blood pressure 124/70, pulse 83, temperature 98 F (36.7 C), temperature source Oral, resp. rate 20, height 5\' 6"  (1.676 m), weight 64.2 kg (141 lb 8.6 oz), SpO2 100.00%.  PHYSICAL EXAM: General appearance: drowsy but arousable Neck: no adenopathy, no JVD, supple,  symmetrical, trachea midline, thyroid not enlarged, symmetric, no tenderness/mass/nodules and  unable to hold his breath for exam but no obvious carotid bruits are heard Lungs: diminished breath sounds bilaterally and wheezes few scattered bilaterally Heart: normal apical impulse, regular rate and rhythm, S1, S2 normal, no S3 or S4 and very distant heart sounds Abdomen: soft, non-tender; bowel sounds normal; no masses,  no organomegaly Extremities: extremities normal, atraumatic, no cyanosis or edema Pulses: unable to palpate any pulses in the right or left foot but the feet are warm with good capillary refill; upper shoulder pulses are normal bilateral femoral bruits are present Skin: Skin color, texture, turgor normal. No rashes or lesions Neurologic: Motor: right-sided weakness 4/5  LABS: Results for orders placed during the hospital encounter of 01/13/12 (from the past 48 hour(s))  COMPREHENSIVE METABOLIC PANEL     Status: Abnormal   Collection Time   01/13/12  8:30 PM      Component Value Range Comment   Sodium 139  135 - 145 mEq/L    Potassium 4.1  3.5 - 5.1 mEq/L    Chloride 103  96 - 112 mEq/L    CO2 20  19 - 32 mEq/L    Glucose, Bld 157 (*) 70 - 99 mg/dL    BUN 18  6 - 23 mg/dL    Creatinine, Ser 0.98  0.50 - 1.35 mg/dL    Calcium 9.7  8.4 - 11.9 mg/dL    Total Protein 7.4  6.0 - 8.3 g/dL    Albumin 4.2  3.5 - 5.2 g/dL    AST 27  0 - 37 U/L HEMOLYSIS AT THIS LEVEL MAY AFFECT RESULT   ALT 14  0 - 53 U/L    Alkaline Phosphatase 74  39 - 117 U/L    Total Bilirubin 0.4  0.3 - 1.2 mg/dL    GFR calc non Af Amer 67 (*) >90 mL/min    GFR calc Af Amer 77 (*) >90 mL/min   CBC     Status: Abnormal   Collection Time   01/13/12  8:30 PM      Component Value Range Comment   WBC 7.4  4.0 - 10.5 K/uL    RBC 4.68  4.22 - 5.81 MIL/uL    Hemoglobin 15.7  13.0 - 17.0 g/dL    HCT 14.7  82.9 - 56.2 %    MCV 94.0  78.0 - 100.0 fL    MCH 33.5  26.0 - 34.0 pg    MCHC 35.7  30.0 - 36.0 g/dL    RDW 13.0  86.5 - 78.4 %    Platelets 143 (*) 150 - 400 K/uL   POCT I-STAT TROPONIN I      Status: Abnormal   Collection Time   01/13/12  8:43 PM      Component Value Range Comment   Troponin i, poc 0.12 (*) 0.00 - 0.08 ng/mL    Comment NOTIFIED PHYSICIAN      Comment 3            CARDIAC PANEL(CRET KIN+CKTOT+MB+TROPI)     Status: Abnormal   Collection Time   01/13/12 11:22 PM      Component Value Range Comment   Total CK 106  7 - 232 U/L    CK, MB 7.3 (*) 0.3 - 4.0 ng/mL    Troponin I 0.46 (*) <0.30 ng/mL    Relative Index 6.9 (*) 0.0 - 2.5   MRSA PCR SCREENING     Status:  Normal   Collection Time   01/14/12  2:29 AM      Component Value Range Comment   MRSA by PCR NEGATIVE  NEGATIVE   CARDIAC PANEL(CRET KIN+CKTOT+MB+TROPI)     Status: Abnormal   Collection Time   01/14/12  2:33 AM      Component Value Range Comment   Total CK 112  7 - 232 U/L    CK, MB 9.2 (*) 0.3 - 4.0 ng/mL CRITICAL VALUE NOTED.  VALUE IS CONSISTENT WITH PREVIOUSLY REPORTED AND CALLED VALUE.   Troponin I 0.79 (*) <0.30 ng/mL    Relative Index 8.2 (*) 0.0 - 2.5   PRO B NATRIURETIC PEPTIDE     Status: Abnormal   Collection Time   01/14/12  2:33 AM      Component Value Range Comment   Pro B Natriuretic peptide (BNP) 5147.0 (*) 0 - 125 pg/mL   BASIC METABOLIC PANEL     Status: Abnormal   Collection Time   01/14/12  2:34 AM      Component Value Range Comment   Sodium 141  135 - 145 mEq/L    Potassium 3.9  3.5 - 5.1 mEq/L    Chloride 107  96 - 112 mEq/L    CO2 21  19 - 32 mEq/L    Glucose, Bld 128 (*) 70 - 99 mg/dL    BUN 15  6 - 23 mg/dL    Creatinine, Ser 4.09  0.50 - 1.35 mg/dL    Calcium 8.8  8.4 - 81.1 mg/dL    GFR calc non Af Amer 65 (*) >90 mL/min    GFR calc Af Amer 75 (*) >90 mL/min   CBC     Status: Abnormal   Collection Time   01/14/12  2:34 AM      Component Value Range Comment   WBC 7.6  4.0 - 10.5 K/uL    RBC 4.30  4.22 - 5.81 MIL/uL    Hemoglobin 14.3  13.0 - 17.0 g/dL    HCT 91.4  78.2 - 95.6 %    MCV 93.7  78.0 - 100.0 fL    MCH 33.3  26.0 - 34.0 pg    MCHC 35.5  30.0 - 36.0  g/dL    RDW 21.3  08.6 - 57.8 %    Platelets 131 (*) 150 - 400 K/uL     IMAGING: Dg Chest 2 View  01/13/2012  *RADIOLOGY REPORT*  Clinical Data: Chest pain today.  Hypertension, diabetes.  History of smoking, stroke.  CHEST - 2 VIEW  Comparison: 02/24/2011  Findings: Heart is mildly enlarged.  There is increased interstitial prominence, most consistent with interstitial edema. No focal consolidations or pleural effusions are identified. Degenerative changes are seen in the spine.  IMPRESSION: Cardiomegaly and interstitial pulmonary edema.  Original Report Authenticated By: Patterson Hammersmith, M.D.    DIAGNOSES: Principal Problem:  *NSTEMI (non-ST elevated myocardial infarction) Active Problems:  HTN (hypertension)  Thrombocytopenia  Hyperglycemia  Tobacco abuse   IMPRESSION: It is most likely that Mr. Ohanesian had an acute inferior myocardial infarction roughly 2 weeks ago. He is having post infarction angina, including persistent angina and rest relieved only by intravenous nitroglycerin. He has EKG changes at this point are intermediate risk. It is not possible to say whether his current slight elevation cardiac enzymes is the harbinger of a new acute coronary event or the "tail end" of the previous infarction. It is very likely that he has multivessel coronary artery  disease based on his age and risk factors.   RECOMMENDATION: 1. He will require coronary angiography. Emergent coronary angiography will be performed if his symptoms become persistent and not relieved with medications. 2. Meanwhile he'll be treated with intravenous nitroglycerin and heparin. Carvedilol has been initiated and is an excellent choice in a patient suspected to be postinfarction angina symptoms of congestive heart failure. However it is very likely that he has severe COPD and if wheezing develops we may have to switch to a more selective beta blocker. I am also going to start a low-dose of an ACE inhibitor. Statins  are likely indicated but will wait for tomorrow's lipid profile.  3. echocardiogram in the morning.  4. Plan for cardiac catheterization on Monday morning barring an emergency. 5. Greatly appreciate Dr. Maxwell Caul care. Will take on our service in AM.   Time Spent Directly with Patient: 60 minutes  Attending Cardiologist [facility]  Charles Hatfield 01/14/2012, 5:21 AM

## 2012-01-14 NOTE — Progress Notes (Signed)
ANTICOAGULATION CONSULT NOTE - Follow Up Consult  Pharmacy Consult for heparin Indication: chest pain/ACS  Labs:  Basename 01/14/12 0510 01/14/12 0234 01/14/12 0233 01/13/12 2322 01/13/12 2030  HGB 14.1 14.3 -- -- --  HCT 39.3 40.3 -- -- 44.0  PLT 128* 131* -- -- 143*  APTT -- -- -- -- --  LABPROT -- -- -- -- --  INR -- -- -- -- --  HEPARINUNFRC 0.14* -- -- -- --  CREATININE -- 1.12 -- -- 1.09  CKTOTAL -- -- 112 106 --  CKMB -- -- 9.2* 7.3* --  TROPONINI -- -- 0.79* 0.46* --    Assessment: 70yo male subtherapeutic on heparin with initial dosing for CP.  Goal of Therapy:  Heparin level 0.3-0.7 units/ml   Plan:  Will give additional heparin bolus of 2000 units and increase gtt by 3-4 units/kg/hr to 900 units/hr and check level in 6-8hr.  Colleen Can PharmD BCPS 01/14/2012,6:19 AM

## 2012-01-14 NOTE — Progress Notes (Signed)
71yo male c/o on-and-off CP x3d, worse today, with i-stat troponin mildly elevated, to begin heparin.   Anticoag: CP/r/o ACS; H/H 14.1/39.3; Plt 128; troponin 0.79. Heparin level 0.26. Plan cath on Monday unless symptoms worsen or persist. Goal heparin level 0.3-0.7  Plan: 1) Increase heparin to 1000 units/hr  2) Heparin level in 8 hours 3) Daily heparin level and CBC

## 2012-01-14 NOTE — H&P (Signed)
Charles Hatfield is an 71 y.o. male.   Chief Complaint: Chest Pain HPI: A 71 yo man with history of Diabetes, HTN and Tobacco abuse who has been off medications for years here with progressive chest pain weakness and shortness of breath with exertion which has been going on for 4 weeks. Patient initially thought it would pass. However his shortness of breath has persisted but the chest pain is even worse. He is having ongoing chest pain rated as 4-8/10. It is relieved by nitroglycerin but comes right back. He has received multiple treatment in the ED but still having recurrent chest pain. He's also had increasing his cardiac enzymes and EKG is relatively unchanged. Patient has quit smoking many years ago but then went back to smoking and currently smokes a pack per day. His TIMI score is 5. He's had previous aortic valve repair. Also previous stroke. He only takes aspirin 325 mg daily. His chest pain radiates down his left arm. Note no diaphoresis. No cough, no fever.  Past Medical History  Diagnosis Date  . Hypertension   . Diabetes mellitus   . H/O ascending aorta repair   . Stroke     Past Surgical History  Procedure Date  . Repair thoracic aorta     History reviewed. No pertinent family history. Social History:  reports that he has been smoking.  He does not have any smokeless tobacco history on file. He reports that he does not drink alcohol or use illicit drugs.  Allergies: No Known Allergies  Medications Prior to Admission  Medication Sig Dispense Refill  . aspirin 325 MG tablet Take 325 mg by mouth daily.        Results for orders placed during the hospital encounter of 01/13/12 (from the past 48 hour(s))  COMPREHENSIVE METABOLIC PANEL     Status: Abnormal   Collection Time   01/13/12  8:30 PM      Component Value Range Comment   Sodium 139  135 - 145 mEq/L    Potassium 4.1  3.5 - 5.1 mEq/L    Chloride 103  96 - 112 mEq/L    CO2 20  19 - 32 mEq/L    Glucose, Bld 157 (*) 70 -  99 mg/dL    BUN 18  6 - 23 mg/dL    Creatinine, Ser 1.61  0.50 - 1.35 mg/dL    Calcium 9.7  8.4 - 09.6 mg/dL    Total Protein 7.4  6.0 - 8.3 g/dL    Albumin 4.2  3.5 - 5.2 g/dL    AST 27  0 - 37 U/L HEMOLYSIS AT THIS LEVEL MAY AFFECT RESULT   ALT 14  0 - 53 U/L    Alkaline Phosphatase 74  39 - 117 U/L    Total Bilirubin 0.4  0.3 - 1.2 mg/dL    GFR calc non Af Amer 67 (*) >90 mL/min    GFR calc Af Amer 77 (*) >90 mL/min   CBC     Status: Abnormal   Collection Time   01/13/12  8:30 PM      Component Value Range Comment   WBC 7.4  4.0 - 10.5 K/uL    RBC 4.68  4.22 - 5.81 MIL/uL    Hemoglobin 15.7  13.0 - 17.0 g/dL    HCT 04.5  40.9 - 81.1 %    MCV 94.0  78.0 - 100.0 fL    MCH 33.5  26.0 - 34.0 pg    MCHC  35.7  30.0 - 36.0 g/dL    RDW 08.6  57.8 - 46.9 %    Platelets 143 (*) 150 - 400 K/uL   POCT I-STAT TROPONIN I     Status: Abnormal   Collection Time   01/13/12  8:43 PM      Component Value Range Comment   Troponin i, poc 0.12 (*) 0.00 - 0.08 ng/mL    Comment NOTIFIED PHYSICIAN      Comment 3            CARDIAC PANEL(CRET KIN+CKTOT+MB+TROPI)     Status: Abnormal   Collection Time   01/13/12 11:22 PM      Component Value Range Comment   Total CK 106  7 - 232 U/L    CK, MB 7.3 (*) 0.3 - 4.0 ng/mL    Troponin I 0.46 (*) <0.30 ng/mL    Relative Index 6.9 (*) 0.0 - 2.5    Dg Chest 2 View  01/13/2012  *RADIOLOGY REPORT*  Clinical Data: Chest pain today.  Hypertension, diabetes.  History of smoking, stroke.  CHEST - 2 VIEW  Comparison: 02/24/2011  Findings: Heart is mildly enlarged.  There is increased interstitial prominence, most consistent with interstitial edema. No focal consolidations or pleural effusions are identified. Degenerative changes are seen in the spine.  IMPRESSION: Cardiomegaly and interstitial pulmonary edema.  Original Report Authenticated By: Patterson Hammersmith, M.D.    Review of Systems  Constitutional: Negative.   HENT: Negative.   Eyes: Negative.     Respiratory: Positive for shortness of breath. Negative for cough, hemoptysis, sputum production and wheezing.   Cardiovascular: Positive for chest pain. Negative for palpitations, orthopnea, claudication, leg swelling and PND.  Gastrointestinal: Positive for nausea.  Genitourinary: Negative.   Musculoskeletal: Negative.   Skin: Negative.   Neurological: Negative.   Endo/Heme/Allergies: Negative.   Psychiatric/Behavioral: Negative.     Blood pressure 112/62, pulse 74, temperature 97.9 F (36.6 C), temperature source Oral, resp. rate 16, height 5\' 6"  (1.676 m), weight 58.968 kg (130 lb), SpO2 96.00%. Physical Exam  Constitutional: He is oriented to person, place, and time. He appears well-developed and well-nourished.  HENT:  Head: Normocephalic and atraumatic.  Right Ear: External ear normal.  Left Ear: External ear normal.  Nose: Nose normal.  Mouth/Throat: Oropharynx is clear and moist.  Eyes: Conjunctivae and EOM are normal. Pupils are equal, round, and reactive to light.  Neck: Normal range of motion. Neck supple.  Cardiovascular: Normal rate, regular rhythm, normal heart sounds and intact distal pulses.   Respiratory: Effort normal and breath sounds normal.  GI: Soft. Bowel sounds are normal.  Musculoskeletal: Normal range of motion.  Neurological: He is alert and oriented to person, place, and time. He has normal reflexes.  Skin: Skin is warm and dry.  Psychiatric: He has a normal mood and affect. His behavior is normal. Judgment and thought content normal.     Assessment/Plan A 71 year old gentleman presenting with chest pain consistent with non-ST elevation MI. He has elevated cardiac enzymes was normal EKG but Classically, symptoms consistent with NSTEMI.  Plan #1 NSTEMI: Patient will be admitted to step down unit. Started on aspirin, nitroglycerin drip, heparin drip, beta blocker, oxygen and morphine. Cardiology has been consulted. Patient may end up getting a cardiac  cath.  #2 diabetes: Patient has been off medications. We will check hemoglobin A1c and continue with sliding scale insulin as needed.  #3 tobacco abuse: Patient has been counseled on we'll continue his tobacco  cessation counseling.  #4 hypertension: Blood pressure seems to be reasonably controlled.  #5 thrombocytopenia: Mild thrombocytopenia probably reactive. GARBA,LAWAL 01/14/2012, 2:34 AM

## 2012-01-15 ENCOUNTER — Encounter (HOSPITAL_COMMUNITY): Payer: Self-pay | Admitting: Cardiology

## 2012-01-15 ENCOUNTER — Encounter (HOSPITAL_COMMUNITY)
Admission: EM | Disposition: A | Discharge status: Skilled Nursing Facility | Payer: Self-pay | Source: Home / Self Care | Attending: Cardiovascular Disease

## 2012-01-15 ENCOUNTER — Inpatient Hospital Stay (HOSPITAL_COMMUNITY): Payer: Medicare Other

## 2012-01-15 DIAGNOSIS — I236 Thrombosis of atrium, auricular appendage, and ventricle as current complications following acute myocardial infarction: Secondary | ICD-10-CM

## 2012-01-15 DIAGNOSIS — R319 Hematuria, unspecified: Secondary | ICD-10-CM

## 2012-01-15 DIAGNOSIS — N200 Calculus of kidney: Secondary | ICD-10-CM

## 2012-01-15 DIAGNOSIS — E119 Type 2 diabetes mellitus without complications: Secondary | ICD-10-CM

## 2012-01-15 DIAGNOSIS — I959 Hypotension, unspecified: Secondary | ICD-10-CM | POA: Insufficient documentation

## 2012-01-15 HISTORY — DX: Thrombosis of atrium, auricular appendage, and ventricle as current complications following acute myocardial infarction: I23.6

## 2012-01-15 HISTORY — DX: Type 2 diabetes mellitus without complications: E11.9

## 2012-01-15 HISTORY — DX: Hematuria, unspecified: R31.9

## 2012-01-15 HISTORY — DX: Calculus of kidney: N20.0

## 2012-01-15 LAB — CBC
HCT: 36.9 % — ABNORMAL LOW (ref 39.0–52.0)
HCT: 35.8 % — ABNORMAL LOW (ref 39.0–52.0)
Hemoglobin: 13.1 g/dL (ref 13.0–17.0)
Hemoglobin: 12.9 g/dL — ABNORMAL LOW (ref 13.0–17.0)
MCH: 33.8 pg (ref 26.0–34.0)
MCH: 33.3 pg (ref 26.0–34.0)
MCHC: 35.6 g/dL (ref 30.0–36.0)
MCHC: 35 g/dL (ref 30.0–36.0)
MCHC: 34.6 g/dL (ref 30.0–36.0)
MCV: 94.8 fL (ref 78.0–100.0)
MCV: 95.7 fL (ref 78.0–100.0)
Platelets: 111 10*3/uL — ABNORMAL LOW (ref 150–400)
RDW: 13.9 % (ref 11.5–15.5)

## 2012-01-15 LAB — URINALYSIS, ROUTINE W REFLEX MICROSCOPIC
Bilirubin Urine: NEGATIVE
Glucose, UA: NEGATIVE mg/dL
Ketones, ur: NEGATIVE mg/dL
Protein, ur: 30 mg/dL — AB

## 2012-01-15 LAB — BASIC METABOLIC PANEL
BUN: 15 mg/dL (ref 6–23)
CO2: 26 mEq/L (ref 19–32)
Chloride: 106 mEq/L (ref 96–112)
Glucose, Bld: 119 mg/dL — ABNORMAL HIGH (ref 70–99)
Potassium: 3.8 mEq/L (ref 3.5–5.1)

## 2012-01-15 LAB — URINE MICROSCOPIC-ADD ON

## 2012-01-15 LAB — PROTIME-INR
INR: 1.11 (ref 0.00–1.49)
Prothrombin Time: 14.5 seconds (ref 11.6–15.2)

## 2012-01-15 LAB — GLUCOSE, CAPILLARY

## 2012-01-15 SURGERY — LEFT HEART CATHETERIZATION WITH CORONARY ANGIOGRAM
Anesthesia: LOCAL

## 2012-01-15 MED ORDER — ASPIRIN 81 MG PO CHEW
324.0000 mg | CHEWABLE_TABLET | ORAL | Status: AC
  Administered 2012-01-16: 324 mg via ORAL
  Filled 2012-01-15: qty 4

## 2012-01-15 MED ORDER — HEPARIN (PORCINE) IN NACL 100-0.45 UNIT/ML-% IJ SOLN
1200.0000 [IU]/h | INTRAMUSCULAR | Status: DC
  Administered 2012-01-15: 1200 [IU]/h via INTRAVENOUS
  Filled 2012-01-15 (×2): qty 250

## 2012-01-15 MED ORDER — SODIUM CHLORIDE 0.9 % IJ SOLN: 3.0000 mL | INTRAMUSCULAR | Status: DC | PRN

## 2012-01-15 MED ORDER — SODIUM CHLORIDE 0.9 % IV SOLN: 250.0000 mL | INTRAVENOUS | Status: DC | PRN

## 2012-01-15 MED ORDER — SODIUM CHLORIDE 0.9 % IJ SOLN
3.0000 mL | Freq: Two times a day (BID) | INTRAMUSCULAR | Status: DC
  Administered 2012-01-15: 3 mL via INTRAVENOUS

## 2012-01-15 MED ORDER — SODIUM CHLORIDE 0.9 % IV SOLN
1.0000 mL/kg/h | INTRAVENOUS | Status: DC
  Administered 2012-01-16: 1 mL/kg/h via INTRAVENOUS

## 2012-01-15 NOTE — Progress Notes (Signed)
Utilization Review Completed.  Charles Hatfield T  01/15/2012  

## 2012-01-15 NOTE — Progress Notes (Signed)
Brief Nutrition Note  Patient identified on the Nutrition Risk Report for Problems Chewing or Swallowing. Pt does not have upper teeth and does not have dentures. Pt has not had any associated weight loss. Prefers his food to be chopped, RD will change diet per pt's preference.    Body mass index is 22.84 kg/(m^2). Pt meets criteria for WNL based on current BMI.   Current diet order is Heart Healthy, no meals have been documented at this time. Labs and medications reviewed.   No further nutrition interventions warranted at this time. If additional nutrition issues arise, please re-consult RD.   Clarene Duke RD, LDN Pager [phone] After Hours pager [phone]

## 2012-01-15 NOTE — Consult Note (Signed)
Consult: left staghorn Requested by Dr. Rennis Golden  History of Present Illness: 71 yo man with history of Diabetes, HTN and Tobacco abuse who has been off medications for years here with progressive chest pain weakness and shortness of breath with exertion which has been going on for 4 weeks.   He is going for coronary angiography in the morning. His echocardiogram demonstrates a somewhat mobile, large, inferoapical thrombus with EF 40-45% and inferior wall infarct. He has been started on Heparin.   He developed hematuria on intravenous nitroglycerin and heparin. A CT A/P was performed which revealed a 2.8 cm left renal stone in the renal pelvis and mid-pole without significant hydronephrosis. The stone was similar on CT scan Aug 2012.     Past Medical History  Diagnosis Date  . Hypertension   . Diabetes mellitus   . H/O ascending aorta repair   . Stroke   . Hematuria 01/15/2012  . Staghorn renal calculus 01/15/2012  . Left ventricular apical thrombus following MI, NSTEMI, 01/13/12 01/15/2012  . DM (diabetes mellitus) 01/15/2012   Past Surgical History  Procedure Date  . Repair thoracic aorta     Home Medications:  Prescriptions prior to admission  Medication Sig Dispense Refill  . aspirin 325 MG tablet Take 325 mg by mouth daily.       Allergies: No Known Allergies  History reviewed. No pertinent family history. Social History:  reports that he has been smoking.  He does not have any smokeless tobacco history on file. He reports that he does not drink alcohol or use illicit drugs.  ROS: A complete review of systems was performed.  All systems are negative except for pertinent findings as noted.   Physical Exam:  Vital signs in last 24 hours: Temp:  [97.3 F (36.3 C)-98.4 F (36.9 C)] 98.1 F (36.7 C) (07/22 1928) Pulse Rate:  [52-68] 58  (07/22 2000) Resp:  [13-28] 13  (07/22 2000) BP: (85-116)/(48-63) 92/48 mmHg (07/22 2000) SpO2:  [96 %-98 %] 97 % (07/22 2006) General:   Alert and oriented, No acute distress HEENT: Normocephalic, atraumatic Neck: No JVD or lymphadenopathy Cardiovascular: Regular rate and rhythm Lungs: Regular rate and effort Abdomen: Soft, nontender, nondistended, no abdominal masses GU - urine clear Back: No CVA tenderness Extremities: No edema Neurologic: Grossly intact  Laboratory Data:  Results for orders placed during the hospital encounter of 01/13/12 (from the past 24 hour(s))  HEPARIN LEVEL (UNFRACTIONATED)     Status: Abnormal   Collection Time   01/14/12 10:15 PM      Component Value Range   Heparin Unfractionated 0.21 (*) 0.30 - 0.70 IU/mL  HEPARIN LEVEL (UNFRACTIONATED)     Status: Abnormal   Collection Time   01/15/12  5:11 AM      Component Value Range   Heparin Unfractionated 0.18 (*) 0.30 - 0.70 IU/mL  CBC     Status: Abnormal   Collection Time   01/15/12  5:11 AM      Component Value Range   WBC 5.6  4.0 - 10.5 K/uL   RBC 3.88 (*) 4.22 - 5.81 MIL/uL   Hemoglobin 13.1  13.0 - 17.0 g/dL   HCT 24.4 (*) 01.0 - 27.2 %   MCV 94.8  78.0 - 100.0 fL   MCH 33.8  26.0 - 34.0 pg   MCHC 35.6  30.0 - 36.0 g/dL   RDW 53.6  64.4 - 03.4 %   Platelets 111 (*) 150 - 400 K/uL  PROTIME-INR  Status: Normal   Collection Time   01/15/12  5:11 AM      Component Value Range   Prothrombin Time 14.5  11.6 - 15.2 seconds   INR 1.11  0.00 - 1.49  URINALYSIS, ROUTINE W REFLEX MICROSCOPIC     Status: Abnormal   Collection Time   01/15/12  6:00 AM      Component Value Range   Color, Urine YELLOW  YELLOW   APPearance CLOUDY (*) CLEAR   Specific Gravity, Urine 1.009  1.005 - 1.030   pH 5.5  5.0 - 8.0   Glucose, UA NEGATIVE  NEGATIVE mg/dL   Hgb urine dipstick LARGE (*) NEGATIVE   Bilirubin Urine NEGATIVE  NEGATIVE   Ketones, ur NEGATIVE  NEGATIVE mg/dL   Protein, ur 30 (*) NEGATIVE mg/dL   Urobilinogen, UA 0.2  0.0 - 1.0 mg/dL   Nitrite NEGATIVE  NEGATIVE   Leukocytes, UA SMALL (*) NEGATIVE  URINE MICROSCOPIC-ADD ON     Status:  Abnormal   Collection Time   01/15/12  6:00 AM      Component Value Range   WBC, UA 7-10  <3 WBC/hpf   RBC / HPF 21-50  <3 RBC/hpf   Bacteria, UA FEW (*) RARE  GLUCOSE, CAPILLARY     Status: Abnormal   Collection Time   01/15/12  7:55 AM      Component Value Range   Glucose-Capillary 105 (*) 70 - 99 mg/dL  BASIC METABOLIC PANEL     Status: Abnormal   Collection Time   01/15/12  3:12 PM      Component Value Range   Sodium 142  135 - 145 mEq/L   Potassium 3.8  3.5 - 5.1 mEq/L   Chloride 106  96 - 112 mEq/L   CO2 26  19 - 32 mEq/L   Glucose, Bld 119 (*) 70 - 99 mg/dL   BUN 15  6 - 23 mg/dL   Creatinine, Ser 3.24 (*) 0.50 - 1.35 mg/dL   Calcium 8.9  8.4 - 40.1 mg/dL   GFR calc non Af Amer 50 (*) >90 mL/min   GFR calc Af Amer 58 (*) >90 mL/min  CBC     Status: Abnormal   Collection Time   01/15/12  3:12 PM      Component Value Range   WBC 4.9  4.0 - 10.5 K/uL   RBC 3.87 (*) 4.22 - 5.81 MIL/uL   Hemoglobin 12.9 (*) 13.0 - 17.0 g/dL   HCT 02.7 (*) 25.3 - 66.4 %   MCV 95.3  78.0 - 100.0 fL   MCH 33.3  26.0 - 34.0 pg   MCHC 35.0  30.0 - 36.0 g/dL   RDW 40.3  47.4 - 25.9 %   Platelets 104 (*) 150 - 400 K/uL  GLUCOSE, CAPILLARY     Status: Abnormal   Collection Time   01/15/12  4:06 PM      Component Value Range   Glucose-Capillary 190 (*) 70 - 99 mg/dL   Comment 1 Notify RN     Comment 2 Documented in Chart     Recent Results (from the past 240 hour(s))  MRSA PCR SCREENING     Status: Normal   Collection Time   01/14/12  2:29 AM      Component Value Range Status Comment   MRSA by PCR NEGATIVE  NEGATIVE Final    Creatinine:  Basename 01/15/12 1512 01/14/12 0234 01/13/12 2030  CREATININE 1.39* 1.12 1.09  Impression/Assessment:  2.8 cm left renal stone. Visible on scout - contains calcium.  I discussed with the patient and his son the nature, risks and benefits of continued stone surveillance (may grow, cause infection or obstruction), ureteroscopy (URS)/laser litho,  ESWL or PCNL. The stone is too big for URS/laser litho. ESWL would require a stent and likely multiple procedures off all anticoagulation three days before and about five days after each procedure. PCNL is best chance of clearing stone in one setting, but again has a high bleeding risk and can take a few hours under a general anesthetic.    Plan:  We all agreed on continuing surveillance of stone. Ultimately, he may need a procedure to remove the stone or in the short term a procedure such as a ureteral stent if he ever developed significant obstruction.    I would proceed with cardiac evaluation and treatment as planned. I told the patient to expect some hematuria, but in most cases it is mild and should not effect kidney or bladder functions.   Given his smoking history I also told them we may want to look in his bladder via outpatient cystoscopy to rule out a neoplastic process. I gave them my card and contact information and asked that they follow-up after he is released from the hospital.    Antony Haste 01/15/2012, 8:59 PM

## 2012-01-15 NOTE — Progress Notes (Addendum)
ANTICOAGULATION CONSULT NOTE - Follow Up Consult  Pharmacy Consult for heparin Indication: chest pain/ACS  Labs:  Basename 01/15/12 0511 01/14/12 2215 01/14/12 1803 01/14/12 1300 01/14/12 1048 01/14/12 0510 01/14/12 0234 01/14/12 0233 01/13/12 2030  HGB 13.1 -- -- -- -- 14.1 -- -- --  HCT 36.8* -- -- -- -- 39.3 40.3 -- --  PLT 111* -- -- -- -- 128* 131* -- --  APTT -- -- -- -- -- -- -- -- --  LABPROT 14.5 -- -- -- -- -- -- -- --  INR 1.11 -- -- -- -- -- -- -- --  HEPARINUNFRC 0.18* 0.21* -- 0.26* -- -- -- -- --  CREATININE -- -- -- -- -- -- 1.12 -- 1.09  CKTOTAL -- -- 80 -- 105 -- -- 112 --  CKMB -- -- 7.2* -- 9.3* -- -- 9.2* --  TROPONINI -- -- 0.67* -- 0.82* -- -- 0.79* --    Assessment: 70yo male subtherapeutic on heparin with initial dosing for CP. CE's still elevated, now trending down. Per nursing no bleeding issues noted. Hgb down slightly will watch. No official plans for cath at this time.  Goal of Therapy:  Heparin level 0.3-0.7 units/ml   Plan:  Increase IV heparin to 1200 units/hr 8hr HL F/up plans for cath  Severiano Gilbert PharmD BCPS 01/15/2012,8:21 AM  Addendum: large amounts of hematuria noted, IV heparin has been discontinued.

## 2012-01-15 NOTE — Progress Notes (Addendum)
[facility]  DAILY PROGRESS NOTE   Subjective:  No chest pain. Currently finishing his breakfast when I walked in to see him.  Objective:  Temp:  [98 F (36.7 C)-98.4 F (36.9 C)] 98.3 F (36.8 C) (07/22 0700) Pulse Rate:  [55-62] 58  (07/22 0800) Resp:  [19-23] 22  (07/22 0800) BP: (85-101)/(48-60) 101/60 mmHg (07/22 0800) SpO2:  [97 %-99 %] 98 % (07/22 0800) Weight change:   Intake/Output from previous day: 07/21 0701 - 07/22 0700 In: 146.7 [I.V.:146.7] Out: 651 [Urine:650; Stool:1]  Intake/Output from this shift: Total I/O In: 20 [I.V.:20] Out: -   Medications: Current Facility-Administered Medications  Medication Dose Route Frequency Provider Last Rate Last Dose  . 0.9 %  sodium chloride infusion  250 mL Intravenous PRN Rometta Emery, MD 10 mL/hr at 01/14/12 0200 10 mL at 01/14/12 0200  . acetaminophen (TYLENOL) tablet 650 mg  650 mg Oral Q6H PRN Rometta Emery, MD       Or  . acetaminophen (TYLENOL) suppository 650 mg  650 mg Rectal Q6H PRN Rometta Emery, MD      . albuterol (PROVENTIL) (5 MG/ML) 0.5% nebulizer solution 2.5 mg  2.5 mg Nebulization Q6H Rometta Emery, MD   2.5 mg at 01/15/12 0748  . albuterol (PROVENTIL) (5 MG/ML) 0.5% nebulizer solution 2.5 mg  2.5 mg Nebulization Q2H PRN Rometta Emery, MD      . aspirin tablet 325 mg  325 mg Oral Daily Rometta Emery, MD   325 mg at 01/14/12 0921  . carvedilol (COREG) tablet 6.25 mg  6.25 mg Oral BID WC Rometta Emery, MD      . heparin ADULT infusion 100 units/mL (25000 units/250 mL)  1,200 Units/hr Intravenous Continuous Severiano Gilbert, PHARMD 10 mL/hr at 01/15/12 0700 1,000 Units/hr at 01/15/12 0700  . ipratropium (ATROVENT) nebulizer solution 0.5 mg  0.5 mg Nebulization Q6H Rometta Emery, MD   0.5 mg at 01/15/12 0748  . lisinopril (PRINIVIL,ZESTRIL) tablet 2.5 mg  2.5 mg Oral Daily Mihai Croitoru, MD   2.5 mg at 01/14/12 0921  . morphine 2 MG/ML injection 2 mg  2  mg Intravenous Q4H PRN Rometta Emery, MD      . nicotine (NICODERM CQ - dosed in mg/24 hours) patch 21 mg  21 mg Transdermal Daily Rolan Lipa, NP   21 mg at 01/14/12 1610  . nitroGLYCERIN (NITROSTAT) SL tablet 0.4 mg  0.4 mg Sublingual Q5 Min x 3 PRN Theotis Burrow, MD   0.4 mg at 01/14/12 0038  . nitroGLYCERIN 0.2 mg/mL in dextrose 5 % infusion  2-200 mcg/min Intravenous Titrated Rometta Emery, MD 3 mL/hr at 01/14/12 2000 10 mcg/min at 01/14/12 2000  . ondansetron (ZOFRAN) tablet 4 mg  4 mg Oral Q6H PRN Rometta Emery, MD       Or  . ondansetron (ZOFRAN) injection 4 mg  4 mg Intravenous Q6H PRN Rometta Emery, MD      . pneumococcal 23 valent vaccine (PNU-IMMUNE) injection 0.5 mL  0.5 mL Intramuscular Tomorrow-1000 Rometta Emery, MD      . sodium chloride 0.9 % injection 3 mL  3 mL Intravenous Q12H Rometta Emery, MD   3 mL at 01/14/12 2200  . sodium chloride 0.9 % injection 3 mL  3 mL Intravenous Q12H Rometta Emery, MD   3 mL at 01/14/12 2200  . sodium chloride 0.9 % injection 3  mL  3 mL Intravenous PRN Rometta Emery, MD        Physical Exam: General appearance: alert and no distress Neck: no adenopathy, no carotid bruit, no JVD, supple, symmetrical, trachea midline and thyroid not enlarged, symmetric, no tenderness/mass/nodules Lungs: clear to auscultation bilaterally Heart: regular rate and rhythm, S1, S2 normal, no murmur, click, rub or gallop Abdomen: soft, non-tender; bowel sounds normal; no masses,  no organomegaly Extremities: extremities normal, atraumatic, no cyanosis or edema Pulses: faint bilaterally  Lab Results: Results for orders placed during the hospital encounter of 01/13/12 (from the past 48 hour(s))  COMPREHENSIVE METABOLIC PANEL     Status: Abnormal   Collection Time   01/13/12  8:30 PM      Component Value Range Comment   Sodium 139  135 - 145 mEq/L    Potassium 4.1  3.5 - 5.1 mEq/L    Chloride 103  96 - 112 mEq/L    CO2  20  19 - 32 mEq/L    Glucose, Bld 157 (*) 70 - 99 mg/dL    BUN 18  6 - 23 mg/dL    Creatinine, Ser 1.61  0.50 - 1.35 mg/dL    Calcium 9.7  8.4 - 09.6 mg/dL    Total Protein 7.4  6.0 - 8.3 g/dL    Albumin 4.2  3.5 - 5.2 g/dL    AST 27  0 - 37 U/L HEMOLYSIS AT THIS LEVEL MAY AFFECT RESULT   ALT 14  0 - 53 U/L    Alkaline Phosphatase 74  39 - 117 U/L    Total Bilirubin 0.4  0.3 - 1.2 mg/dL    GFR calc non Af Amer 67 (*) >90 mL/min    GFR calc Af Amer 77 (*) >90 mL/min   CBC     Status: Abnormal   Collection Time   01/13/12  8:30 PM      Component Value Range Comment   WBC 7.4  4.0 - 10.5 K/uL    RBC 4.68  4.22 - 5.81 MIL/uL    Hemoglobin 15.7  13.0 - 17.0 g/dL    HCT 04.5  40.9 - 81.1 %    MCV 94.0  78.0 - 100.0 fL    MCH 33.5  26.0 - 34.0 pg    MCHC 35.7  30.0 - 36.0 g/dL    RDW 91.4  78.2 - 95.6 %    Platelets 143 (*) 150 - 400 K/uL   POCT I-STAT TROPONIN I     Status: Abnormal   Collection Time   01/13/12  8:43 PM      Component Value Range Comment   Troponin i, poc 0.12 (*) 0.00 - 0.08 ng/mL    Comment NOTIFIED PHYSICIAN      Comment 3            CARDIAC PANEL(CRET KIN+CKTOT+MB+TROPI)     Status: Abnormal   Collection Time   01/13/12 11:22 PM      Component Value Range Comment   Total CK 106  7 - 232 U/L    CK, MB 7.3 (*) 0.3 - 4.0 ng/mL    Troponin I 0.46 (*) <0.30 ng/mL    Relative Index 6.9 (*) 0.0 - 2.5   MRSA PCR SCREENING     Status: Normal   Collection Time   01/14/12  2:29 AM      Component Value Range Comment   MRSA by PCR NEGATIVE  NEGATIVE   CARDIAC PANEL(CRET KIN+CKTOT+MB+TROPI)  Status: Abnormal   Collection Time   01/14/12  2:33 AM      Component Value Range Comment   Total CK 112  7 - 232 U/L    CK, MB 9.2 (*) 0.3 - 4.0 ng/mL CRITICAL VALUE NOTED.  VALUE IS CONSISTENT WITH PREVIOUSLY REPORTED AND CALLED VALUE.   Troponin I 0.79 (*) <0.30 ng/mL    Relative Index 8.2 (*) 0.0 - 2.5   PRO B NATRIURETIC PEPTIDE     Status: Abnormal   Collection Time    01/14/12  2:33 AM      Component Value Range Comment   Pro B Natriuretic peptide (BNP) 5147.0 (*) 0 - 125 pg/mL   TSH     Status: Normal   Collection Time   01/14/12  2:34 AM      Component Value Range Comment   TSH 0.589  0.350 - 4.500 uIU/mL   BASIC METABOLIC PANEL     Status: Abnormal   Collection Time   01/14/12  2:34 AM      Component Value Range Comment   Sodium 141  135 - 145 mEq/L    Potassium 3.9  3.5 - 5.1 mEq/L    Chloride 107  96 - 112 mEq/L    CO2 21  19 - 32 mEq/L    Glucose, Bld 128 (*) 70 - 99 mg/dL    BUN 15  6 - 23 mg/dL    Creatinine, Ser 1.61  0.50 - 1.35 mg/dL    Calcium 8.8  8.4 - 09.6 mg/dL    GFR calc non Af Amer 65 (*) >90 mL/min    GFR calc Af Amer 75 (*) >90 mL/min   CBC     Status: Abnormal   Collection Time   01/14/12  2:34 AM      Component Value Range Comment   WBC 7.6  4.0 - 10.5 K/uL    RBC 4.30  4.22 - 5.81 MIL/uL    Hemoglobin 14.3  13.0 - 17.0 g/dL    HCT 04.5  40.9 - 81.1 %    MCV 93.7  78.0 - 100.0 fL    MCH 33.3  26.0 - 34.0 pg    MCHC 35.5  30.0 - 36.0 g/dL    RDW 91.4  78.2 - 95.6 %    Platelets 131 (*) 150 - 400 K/uL   HEPARIN LEVEL (UNFRACTIONATED)     Status: Abnormal   Collection Time   01/14/12  5:10 AM      Component Value Range Comment   Heparin Unfractionated 0.14 (*) 0.30 - 0.70 IU/mL   CBC     Status: Abnormal   Collection Time   01/14/12  5:10 AM      Component Value Range Comment   WBC 7.0  4.0 - 10.5 K/uL    RBC 4.20 (*) 4.22 - 5.81 MIL/uL    Hemoglobin 14.1  13.0 - 17.0 g/dL    HCT 21.3  08.6 - 57.8 %    MCV 93.6  78.0 - 100.0 fL    MCH 33.6  26.0 - 34.0 pg    MCHC 35.9  30.0 - 36.0 g/dL    RDW 46.9  62.9 - 52.8 %    Platelets 128 (*) 150 - 400 K/uL   LIPID PANEL     Status: Abnormal   Collection Time   01/14/12  6:00 AM      Component Value Range Comment   Cholesterol 141  0 - 200  mg/dL    Triglycerides 161 (*) <150 mg/dL    HDL 30 (*) >09 mg/dL    Total CHOL/HDL Ratio 4.7      VLDL 49 (*) 0 - 40 mg/dL     LDL Cholesterol 62  0 - 99 mg/dL   GLUCOSE, CAPILLARY     Status: Abnormal   Collection Time   01/14/12  9:21 AM      Component Value Range Comment   Glucose-Capillary 112 (*) 70 - 99 mg/dL   CARDIAC PANEL(CRET KIN+CKTOT+MB+TROPI)     Status: Abnormal   Collection Time   01/14/12 10:48 AM      Component Value Range Comment   Total CK 105  7 - 232 U/L    CK, MB 9.3 (*) 0.3 - 4.0 ng/mL CRITICAL VALUE NOTED.  VALUE IS CONSISTENT WITH PREVIOUSLY REPORTED AND CALLED VALUE.   Troponin I 0.82 (*) <0.30 ng/mL    Relative Index 8.9 (*) 0.0 - 2.5   HEPARIN LEVEL (UNFRACTIONATED)     Status: Abnormal   Collection Time   01/14/12  1:00 PM      Component Value Range Comment   Heparin Unfractionated 0.26 (*) 0.30 - 0.70 IU/mL   CARDIAC PANEL(CRET KIN+CKTOT+MB+TROPI)     Status: Abnormal   Collection Time   01/14/12  6:03 PM      Component Value Range Comment   Total CK 80  7 - 232 U/L    CK, MB 7.2 (*) 0.3 - 4.0 ng/mL CRITICAL VALUE NOTED.  VALUE IS CONSISTENT WITH PREVIOUSLY REPORTED AND CALLED VALUE.   Troponin I 0.67 (*) <0.30 ng/mL    Relative Index RELATIVE INDEX IS INVALID  0.0 - 2.5   HEPARIN LEVEL (UNFRACTIONATED)     Status: Abnormal   Collection Time   01/14/12 10:15 PM      Component Value Range Comment   Heparin Unfractionated 0.21 (*) 0.30 - 0.70 IU/mL   HEPARIN LEVEL (UNFRACTIONATED)     Status: Abnormal   Collection Time   01/15/12  5:11 AM      Component Value Range Comment   Heparin Unfractionated 0.18 (*) 0.30 - 0.70 IU/mL   CBC     Status: Abnormal   Collection Time   01/15/12  5:11 AM      Component Value Range Comment   WBC 5.6  4.0 - 10.5 K/uL    RBC 3.88 (*) 4.22 - 5.81 MIL/uL    Hemoglobin 13.1  13.0 - 17.0 g/dL    HCT 60.4 (*) 54.0 - 52.0 %    MCV 94.8  78.0 - 100.0 fL    MCH 33.8  26.0 - 34.0 pg    MCHC 35.6  30.0 - 36.0 g/dL    RDW 98.1  19.1 - 47.8 %    Platelets 111 (*) 150 - 400 K/uL PLATELET COUNT CONFIRMED BY SMEAR  PROTIME-INR     Status: Normal    Collection Time   01/15/12  5:11 AM      Component Value Range Comment   Prothrombin Time 14.5  11.6 - 15.2 seconds    INR 1.11  0.00 - 1.49   URINALYSIS, ROUTINE W REFLEX MICROSCOPIC     Status: Abnormal   Collection Time   01/15/12  6:00 AM      Component Value Range Comment   Color, Urine YELLOW  YELLOW    APPearance CLOUDY (*) CLEAR    Specific Gravity, Urine 1.009  1.005 - 1.030  pH 5.5  5.0 - 8.0    Glucose, UA NEGATIVE  NEGATIVE mg/dL    Hgb urine dipstick LARGE (*) NEGATIVE    Bilirubin Urine NEGATIVE  NEGATIVE    Ketones, ur NEGATIVE  NEGATIVE mg/dL    Protein, ur 30 (*) NEGATIVE mg/dL    Urobilinogen, UA 0.2  0.0 - 1.0 mg/dL    Nitrite NEGATIVE  NEGATIVE    Leukocytes, UA SMALL (*) NEGATIVE   URINE MICROSCOPIC-ADD ON     Status: Abnormal   Collection Time   01/15/12  6:00 AM      Component Value Range Comment   WBC, UA 7-10  <3 WBC/hpf    RBC / HPF 21-50  <3 RBC/hpf    Bacteria, UA FEW (*) RARE   GLUCOSE, CAPILLARY     Status: Abnormal   Collection Time   01/15/12  7:55 AM      Component Value Range Comment   Glucose-Capillary 105 (*) 70 - 99 mg/dL     Imaging: Dg Chest 2 View  01/13/2012  *RADIOLOGY REPORT*  Clinical Data: Chest pain today.  Hypertension, diabetes.  History of smoking, stroke.  CHEST - 2 VIEW  Comparison: 02/24/2011  Findings: Heart is mildly enlarged.  There is increased interstitial prominence, most consistent with interstitial edema. No focal consolidations or pleural effusions are identified. Degenerative changes are seen in the spine.  IMPRESSION: Cardiomegaly and interstitial pulmonary edema.  Original Report Authenticated By: Patterson Hammersmith, M.D.    Assessment:  1. Principal Problem: 2.  *NSTEMI (non-ST elevated myocardial infarction) 3. Active Problems: 4.  HTN (hypertension) 5.  Thrombocytopenia 6.  Hyperglycemia 7.  Tobacco abuse 8.   Plan: 1. Currently chest pain free, unfortunately, LHC was planned this am but he is eating  breakfast. Will make NPO and see about postponing it until later this afternoon or possibly tomorrow.  Time Spent Directly with Patient:  15 minutes  Length of Stay:  LOS: 2 days   Chrystie Nose, MD, [facility] Attending Cardiologist [facility]  Esteban Kobashigawa C 01/15/2012, 8:30 AM  Addendum:  Urinalysis shows large amount of blood, no clear evidence for infection. He has a history of kidney stones per his son's report. He is not reporting flank pain. Will obtain non-contrast CT scan today to evaluate further. May need a urologic consult depending on results.  Postpone LHC until hematuria is addressed due to possible anticoagulation issues. I have discontinued heparin due to gross hematuria.

## 2012-01-15 NOTE — Progress Notes (Signed)
  Echocardiogram 2D Echocardiogram has been performed.  Charles Hatfield FRANCES 01/15/2012, 5:51 PM

## 2012-01-15 NOTE — Progress Notes (Signed)
I have reviewed Mr. Wyse's CT scan and echocardiogram. The CT was ordered due to gross hematuria on heparin and history of nephrolithiasis. This demonstrates a large left "staghorn" calculus which is unchanged from a prior study. Due to bleeding, I had subsequently stopped his heparin. His echocardiogram, however, demonstrates a somewhat mobile, large, inferoapical thrombus with EF 40-45% and inferior wall infarct.  Based on this, I would suggest restarting heparin due to the thrombus. It would be helpful at this point to involve urology with regard to suggestions on managing ongoing hematuria. ?lithotripsy or ureteroscopic removal?  Chrystie Nose, MD, [facility] Attending Cardiologist [facility]

## 2012-01-16 ENCOUNTER — Encounter (HOSPITAL_COMMUNITY)
Admission: EM | Disposition: A | Discharge status: Skilled Nursing Facility | Payer: Self-pay | Source: Home / Self Care | Attending: Cardiovascular Disease

## 2012-01-16 ENCOUNTER — Encounter (HOSPITAL_COMMUNITY): Payer: Medicare Other

## 2012-01-16 HISTORY — PX: CORONARY ANGIOGRAM: SHX5466

## 2012-01-16 LAB — URINE CULTURE
Colony Count: NO GROWTH
Culture: NO GROWTH

## 2012-01-16 LAB — CBC
Hemoglobin: 13.4 g/dL (ref 13.0–17.0)
MCH: 33 pg (ref 26.0–34.0)
MCV: 96.3 fL (ref 78.0–100.0)
RBC: 4.06 MIL/uL — ABNORMAL LOW (ref 4.22–5.81)
WBC: 5.6 10*3/uL (ref 4.0–10.5)

## 2012-01-16 LAB — BLOOD GAS, ARTERIAL
Drawn by: 32526
FIO2: 0.21 %
O2 Saturation: 96.7 %
Patient temperature: 98.6
pH, Arterial: 7.376 (ref 7.350–7.450)

## 2012-01-16 LAB — BASIC METABOLIC PANEL
CO2: 25 mEq/L (ref 19–32)
Calcium: 9.2 mg/dL (ref 8.4–10.5)
Creatinine, Ser: 1.43 mg/dL — ABNORMAL HIGH (ref 0.50–1.35)
Glucose, Bld: 117 mg/dL — ABNORMAL HIGH (ref 70–99)

## 2012-01-16 LAB — PULMONARY FUNCTION TEST

## 2012-01-16 LAB — HEPARIN LEVEL (UNFRACTIONATED): Heparin Unfractionated: 0.36 IU/mL (ref 0.30–0.70)

## 2012-01-16 LAB — GLUCOSE, CAPILLARY: Glucose-Capillary: 100 mg/dL — ABNORMAL HIGH (ref 70–99)

## 2012-01-16 LAB — MAGNESIUM: Magnesium: 1.8 mg/dL (ref 1.5–2.5)

## 2012-01-16 LAB — PROTIME-INR: Prothrombin Time: 14.9 seconds (ref 11.6–15.2)

## 2012-01-16 SURGERY — CORONARY ANGIOGRAM

## 2012-01-16 MED ORDER — HEPARIN (PORCINE) IN NACL 2-0.9 UNIT/ML-% IJ SOLN
INTRAMUSCULAR | Status: AC
  Filled 2012-01-16: qty 2000

## 2012-01-16 MED ORDER — HEPARIN SODIUM (PORCINE) 1000 UNIT/ML IJ SOLN
INTRAMUSCULAR | Status: AC
  Filled 2012-01-16: qty 1

## 2012-01-16 MED ORDER — FLUTICASONE-SALMETEROL 250-50 MCG/DOSE IN AEPB
1.0000 | INHALATION_SPRAY | Freq: Two times a day (BID) | RESPIRATORY_TRACT | Status: DC
  Administered 2012-01-17 – 2012-01-29 (×20): 1 via RESPIRATORY_TRACT
  Filled 2012-01-16 (×3): qty 14

## 2012-01-16 MED ORDER — MIDAZOLAM HCL 2 MG/2ML IJ SOLN
INTRAMUSCULAR | Status: AC
  Filled 2012-01-16: qty 2

## 2012-01-16 MED ORDER — LIDOCAINE HCL (PF) 1 % IJ SOLN
INTRAMUSCULAR | Status: AC
  Filled 2012-01-16: qty 30

## 2012-01-16 MED ORDER — SODIUM CHLORIDE 0.9 % IJ SOLN: 3.0000 mL | INTRAMUSCULAR | Status: DC | PRN

## 2012-01-16 MED ORDER — SODIUM CHLORIDE 0.9 % IV SOLN
250.0000 mL | INTRAVENOUS | Status: DC
  Administered 2012-01-16: 250 mL via INTRAVENOUS

## 2012-01-16 MED ORDER — SODIUM CHLORIDE 0.9 % IV SOLN
INTRAVENOUS | Status: DC
  Administered 2012-01-16: 09:00:00 via INTRAVENOUS

## 2012-01-16 MED ORDER — OXYCODONE-ACETAMINOPHEN 5-325 MG PO TABS: 1.0000 | ORAL_TABLET | ORAL | Status: DC | PRN

## 2012-01-16 MED ORDER — VERAPAMIL HCL 2.5 MG/ML IV SOLN
INTRAVENOUS | Status: AC
  Filled 2012-01-16: qty 2

## 2012-01-16 MED ORDER — SODIUM CHLORIDE 0.9 % IJ SOLN
3.0000 mL | Freq: Two times a day (BID) | INTRAMUSCULAR | Status: DC
  Administered 2012-01-16 – 2012-01-18 (×4): 3 mL via INTRAVENOUS
  Administered 2012-01-19: 09:00:00 via INTRAVENOUS
  Administered 2012-01-20 – 2012-01-21 (×2): 3 mL via INTRAVENOUS

## 2012-01-16 MED ORDER — ACETAMINOPHEN 325 MG PO TABS
650.0000 mg | ORAL_TABLET | ORAL | Status: DC | PRN
  Administered 2012-01-17 – 2012-01-19 (×4): 650 mg via ORAL
  Filled 2012-01-16 (×4): qty 2

## 2012-01-16 MED ORDER — SODIUM CHLORIDE 0.9 % IV SOLN: 1.0000 mL/kg/h | INTRAVENOUS | Status: AC

## 2012-01-16 MED ORDER — HEPARIN (PORCINE) IN NACL 100-0.45 UNIT/ML-% IJ SOLN
1500.0000 [IU]/h | INTRAMUSCULAR | Status: DC
  Administered 2012-01-16 – 2012-01-18 (×2): 1200 [IU]/h via INTRAVENOUS
  Administered 2012-01-19 – 2012-01-20 (×3): 1350 [IU]/h via INTRAVENOUS
  Administered 2012-01-21 (×2): 1500 [IU]/h via INTRAVENOUS
  Filled 2012-01-16 (×12): qty 250

## 2012-01-16 MED ORDER — NITROGLYCERIN 0.2 MG/ML ON CALL CATH LAB
INTRAVENOUS | Status: AC
  Filled 2012-01-16: qty 1

## 2012-01-16 MED ORDER — ALBUTEROL SULFATE (5 MG/ML) 0.5% IN NEBU
2.5000 mg | INHALATION_SOLUTION | Freq: Once | RESPIRATORY_TRACT | Status: AC
  Administered 2012-01-16: 2.5 mg via RESPIRATORY_TRACT

## 2012-01-16 MED ORDER — ONDANSETRON HCL 4 MG/2ML IJ SOLN: 4.0000 mg | Freq: Four times a day (QID) | INTRAMUSCULAR | Status: DC | PRN

## 2012-01-16 MED FILL — Sodium Chloride Flush IV Soln 0.9%: INTRAVENOUS | Qty: 3 | Status: AC

## 2012-01-16 MED FILL — Carvedilol Tab 6.25 MG: ORAL | Qty: 1 | Status: AC

## 2012-01-16 NOTE — CV Procedure (Signed)
Hensen,Yahia F Male, 71 y.o., 01-21-1941  Location: [facility]  Bed: 2926-01  MRN: [id number]  CSN: [id number]   CARDIAC CATHETERIZATION REPORT   Procedures performed:  1. Left heart catheterization  2. Selective coronary angiography   Reason for procedure:  Recent non ST segment elevation myocardial infarction  Unstable angina pectoris/postinfarction angina Congestive heart failure  Procedure performed by: Thurmon Fair, MD, [facility]  Complications: none   Estimated blood loss: less than 5 mL   History:  This is a 70 year old gentleman who presented late following an acute myocardial infarction. It is unclear whether this was initially in the ST segment elevation inferolateral myocardial infarction. The EKG shows new inferior Q waves and diffuse ST segment changes compared with a year ago. The cardiac enzymes were minimally elevated and showed a downward trend. He has had continuous postinfarction angina with minimal activity.  Consent: The risks, benefits, and details of the procedure were explained to the patient. Risks including death, MI, stroke, bleeding, limb ischemia, renal failure and allergy were described and accepted by the patient. Informed written consent was obtained prior to proceeding.  Technique: The patient was brought to the cardiac catheterization laboratory in the fasting state. He was prepped and draped in the usual sterile fashion. Local anesthesia with 1% lidocaine was administered to the right wrist area. Using the modified Seldinger technique a 5 French right radial artery sheath was introduced without difficulty. Under fluoroscopic guidance, using 5 [place] TIG catheters, selective cannulation of the left coronary artery, right coronary artery and left ventricle were respectively performed. Several coronary angiograms in a variety of projections were recorded, as well as a left ventriculogram in the RAO projection. Left ventricular pressure and a pull back  to the aorta were recorded. No immediate complications occurred. At the end of the procedure, all catheters were removed. After the procedure, hemostasis will be achieved with manual pressure.  Contrast used: 50 mL Omnipaque  Angiographic Findings:  1. The left main coronary artery shows significant atherosclerosis and bifurcates in the usual fashion into the left anterior descending artery and left circumflex coronary artery. There is a moderate eccentric plaque with less than 30% stenosis. 2. The left anterior descending artery is a large vessel that reaches the apex and generates 2 major diagonal branches. There is evidence of extensive luminal irregularities and moderate calcification.   There is a severe (at least a 95%) eccentric stenosis in the proximal LAD, upstream of all the major branches. It appears moderately calcified. Several intermediate lesions are seen in the mid LAD artery. The distal LAD artery is totally occluded just above the apical "mustache". The apical segment of the LAD fills late via bridging collaterals.   Both major diagonal branches are large and have diffuse disease. There is about 50-60% ostial stenosis and both these vessels. 3. The left circumflex coronary artery is a small to medium-size vessel non- dominant vessel that generates 2 major oblique marginal arteries. The left circumflex this artery is totally occluded before the takeoff of these branches which are seen feeling poorly via left to left collaterals. 4. The right coronary artery is a very large-size dominant vessel that generates an extensive posterior lateral ventricular system as well as the posterior descending artery. The right coronary artery is flush occluded at the ostium and extensive calcification is seen from the ostium all the way to the acute margin. Excellent collaterals from the LAD artery filled the distal branches of the right coronary artery which appears to be a  fairly large caliber vessel  and may be an adequate target for bypass. 5. The left ventricle is known to have an apical inferior thrombus by echocardiography. The aortic valve was therefore not crossed. By echo the ejection fraction is approximately 35% with wall motion abnormalities primarily in the distribution of the right coronary artery.   IMPRESSIONS:  Mr. Offord has extensive multivessel disease with total occlusion of the right coronary artery and left circumflex coronary artery and a high-grade unstable appearing stenosis in the proximal LAD artery as well as distal occlusion of the LAD artery. He has depressive ventricular systolic function. He is a lifelong smoker and may have extensive COPD. RECOMMENDATION:  If his pulmonary function permits Mr. Studstill would be best served by multivessel coronary bypass surgery. Percutaneous intervention will be severely limited. The chronic total occlusions of the right coronary artery and left circumflex coronary artery do not have favorable features for percutaneous revascularization.   While the proximal LAD artery stenosis can be approached by percutaneous means there Jeopardy risk is extremely high since this vessel provides coronary flow to virtually the entire left ventricular myocardium he did directly or via collaterals. However, this may be the only option if he is deemed to be too high risk for surgery.  Medical therapy is likely to be associated with poor prognosis, especially since the patient has ongoing angina pectoris, consistent with extensive myocardial ischemia.    Thurmon Fair, MD, [facility] [phone] office [phone] pager 01/16/2012 5:03 PM

## 2012-01-16 NOTE — Progress Notes (Signed)
Patient ID: Charles Hatfield, male   DOB: [birth year], 71 y.o.   MRN: [id number]  Chart and labs reviewed.   Pt rise in serum Cr is likely related to initiation of ACE inhibitor therapy on 01/14/2012 and less likely kidney stone.

## 2012-01-16 NOTE — Progress Notes (Signed)
ANTICOAGULATION CONSULT NOTE - Follow Up Consult  Pharmacy Consult for heparin Indication: LV thrombus  Labs:  Basename 01/16/12 0510 01/15/12 2250 01/15/12 1512 01/15/12 0511 01/14/12 2215 01/14/12 1803 01/14/12 1048 01/14/12 0234 01/14/12 0233  HGB 13.4 12.4* -- -- -- -- -- -- --  HCT 39.1 35.8* 36.9* -- -- -- -- -- --  PLT 104* 102* 104* -- -- -- -- -- --  APTT -- -- -- -- -- -- -- -- --  LABPROT 14.9 -- -- 14.5 -- -- -- -- --  INR 1.15 -- -- 1.11 -- -- -- -- --  HEPARINUNFRC 0.36 -- -- 0.18* 0.21* -- -- -- --  CREATININE 1.43* -- 1.39* -- -- -- -- 1.12 --  CKTOTAL -- -- -- -- -- 80 105 -- 112  CKMB -- -- -- -- -- 7.2* 9.3* -- 9.2*  TROPONINI -- -- -- -- -- 0.67* 0.82* -- 0.79*    Assessment:  Charles Hatfield is back from his cardiac cath.  Spoke with Dr. Royann Shivers on the phone.  He got a heparin bolus during the cath procedure.  He had some oozing at the TR band site per RN but controlled.   He has an LV thrombus on echo and significant CAD per cath lab report.  To resume heparin drip now per Dr. Erin Hearing verbal orders. He has a kidney stone causing hematuria evaluated by GU.    Goal: heparin level 0.3-0.7  Plan: 1. Resume heparin at 1200 units/hr. 2. 8 hr HL 3. Daily HL and CBC while on heparin  Herby Abraham, Pharm.D. [phone] 01/16/2012 12:16 PM

## 2012-01-16 NOTE — Progress Notes (Signed)
ANTICOAGULATION CONSULT NOTE - Follow Up Consult  Pharmacy Consult for heparin Indication: LV thrombus  Labs:  Basename 01/16/12 0510 01/15/12 2250 01/15/12 1512 01/15/12 0511 01/14/12 2215 01/14/12 1803 01/14/12 1048 01/14/12 0234 01/14/12 0233  HGB 13.4 12.4* -- -- -- -- -- -- --  HCT 39.1 35.8* 36.9* -- -- -- -- -- --  PLT 104* 102* 104* -- -- -- -- -- --  APTT -- -- -- -- -- -- -- -- --  LABPROT 14.9 -- -- 14.5 -- -- -- -- --  INR 1.15 -- -- 1.11 -- -- -- -- --  HEPARINUNFRC 0.36 -- -- 0.18* 0.21* -- -- -- --  CREATININE 1.43* -- 1.39* -- -- -- -- 1.12 --  CKTOTAL -- -- -- -- -- 80 105 -- 112  CKMB -- -- -- -- -- 7.2* 9.3* -- 9.2*  TROPONINI -- -- -- -- -- 0.67* 0.82* -- 0.79*    Assessment/Plan:  71yo male now therapeutic on heparin after resumed post-echo for LV thrombus.  H/H now trending up.  Will continue heparin gtt at current rate and confirm stable with additional level.  Colleen Can PharmD BCPS 01/16/2012,6:34 AM

## 2012-01-16 NOTE — Progress Notes (Signed)
ANTICOAGULATION CONSULT NOTE - Follow Up Consult  Pharmacy Consult for heparin Indication: LV thrombus  Labs:  Basename 01/16/12 2120 01/16/12 0510 01/15/12 2250 01/15/12 1512 01/15/12 0511 01/14/12 1803 01/14/12 1048 01/14/12 0234 01/14/12 0233  HGB -- 13.4 12.4* -- -- -- -- -- --  HCT -- 39.1 35.8* 36.9* -- -- -- -- --  PLT -- 104* 102* 104* -- -- -- -- --  APTT -- -- -- -- -- -- -- -- --  LABPROT -- 14.9 -- -- 14.5 -- -- -- --  INR -- 1.15 -- -- 1.11 -- -- -- --  HEPARINUNFRC 0.36 0.36 -- -- 0.18* -- -- -- --  CREATININE -- 1.43* -- 1.39* -- -- -- 1.12 --  CKTOTAL -- -- -- -- -- 80 105 -- 112  CKMB -- -- -- -- -- 7.2* 9.3* -- 9.2*  TROPONINI -- -- -- -- -- 0.67* 0.82* -- 0.79*    Assessment:  [name] has an LV thrombus on echo and significant CAD per cath lab report.  Heparin was resumed post cath and is now at goal   Goal Heparin level 0.3-0.7 Platelet monitoring per protocol: yes  Plan: -Continue heparin at current rate -Daily heparin level and CBC  Harland German, Pharm D 01/16/2012 10:10 PM

## 2012-01-16 NOTE — Progress Notes (Signed)
[facility]  Subjective: He had CP this morning.    Objective: Vital signs in last 24 hours: Temp:  [97.3 F (36.3 C)-98.4 F (36.9 C)] 98.4 F (36.9 C) (07/23 0356) Pulse Rate:  [52-70] 70  (07/23 0356) Resp:  [13-28] 14  (07/23 0356) BP: (89-116)/(48-64) 110/64 mmHg (07/23 0356) SpO2:  [96 %-98 %] 97 % (07/23 0356) Weight:  [65.3 kg (143 lb 15.4 oz)] 65.3 kg (143 lb 15.4 oz) (07/23 0500) Last BM Date: 01/15/12  Intake/Output from previous day: 07/22 0701 - 07/23 0700 In: 500.8 [I.V.:500.8] Out: 1850 [Urine:1850] Intake/Output this shift:    Medications Current Facility-Administered Medications  Medication Dose Route Frequency Provider Last Rate Last Dose  . 0.9 %  sodium chloride infusion  250 mL Intravenous PRN Chrystie Nose, MD      . 0.9 %  sodium chloride infusion  1 mL/kg/hr Intravenous Continuous Chrystie Nose, MD 64.2 mL/hr at 01/16/12 0400 1 mL/kg/hr at 01/16/12 0400  . acetaminophen (TYLENOL) tablet 650 mg  650 mg Oral Q6H PRN Rometta Emery, MD   650 mg at 01/15/12 1538   Or  . acetaminophen (TYLENOL) suppository 650 mg  650 mg Rectal Q6H PRN Rometta Emery, MD      . albuterol (PROVENTIL) (5 MG/ML) 0.5% nebulizer solution 2.5 mg  2.5 mg Nebulization Q2H PRN Rometta Emery, MD      . aspirin chewable tablet 324 mg  324 mg Oral Pre-Cath Chrystie Nose, MD   324 mg at 01/16/12 0805  . aspirin tablet 325 mg  325 mg Oral Daily Rometta Emery, MD   325 mg at 01/15/12 0911  . carvedilol (COREG) tablet 6.25 mg  6.25 mg Oral BID WC Rometta Emery, MD   6.25 mg at 01/15/12 1828  . heparin ADULT infusion 100 units/mL (25000 units/250 mL)  1,200 Units/hr Intravenous Continuous Grisela Mesch, MD 12 mL/hr at 01/15/12 2000 1,200 Units/hr at 01/15/12 2000  . lisinopril (PRINIVIL,ZESTRIL) tablet 2.5 mg  2.5 mg Oral Daily Massey Ruhland, MD   2.5 mg at 01/15/12 1230  . morphine 2 MG/ML injection 2 mg  2 mg Intravenous Q4H PRN  Rometta Emery, MD      . nicotine (NICODERM CQ - dosed in mg/24 hours) patch 21 mg  21 mg Transdermal Daily Rolan Lipa, NP   21 mg at 01/15/12 0911  . nitroGLYCERIN (NITROSTAT) SL tablet 0.4 mg  0.4 mg Sublingual Q5 Min x 3 PRN Theotis Burrow, MD   0.4 mg at 01/14/12 0038  . nitroGLYCERIN 0.2 mg/mL in dextrose 5 % infusion  2-200 mcg/min Intravenous Titrated Rometta Emery, MD 3 mL/hr at 01/14/12 2000 10 mcg/min at 01/14/12 2000  . ondansetron (ZOFRAN) tablet 4 mg  4 mg Oral Q6H PRN Rometta Emery, MD       Or  . ondansetron (ZOFRAN) injection 4 mg  4 mg Intravenous Q6H PRN Rometta Emery, MD      . pneumococcal 23 valent vaccine (PNU-IMMUNE) injection 0.5 mL  0.5 mL Intramuscular Tomorrow-1000 Rometta Emery, MD   0.5 mL at 01/15/12 1829  . sodium chloride 0.9 % injection 3 mL  3 mL Intravenous Q12H Chrystie Nose, MD   3 mL at 01/15/12 2016  . sodium chloride 0.9 % injection 3 mL  3 mL Intravenous PRN Chrystie Nose, MD      . DISCONTD: 0.9 %  sodium chloride infusion  250 mL Intravenous PRN Rometta Emery, MD 10 mL/hr at 01/14/12 0200 10 mL at 01/14/12 0200  . DISCONTD: albuterol (PROVENTIL) (5 MG/ML) 0.5% nebulizer solution 2.5 mg  2.5 mg Nebulization Q6H Rometta Emery, MD   2.5 mg at 01/15/12 2006  . DISCONTD: heparin ADULT infusion 100 units/mL (25000 units/250 mL)  1,200 Units/hr Intravenous Continuous Severiano Gilbert, PHARMD   1,000 Units/hr at 01/15/12 0700  . DISCONTD: ipratropium (ATROVENT) nebulizer solution 0.5 mg  0.5 mg Nebulization Q6H Rometta Emery, MD   0.5 mg at 01/15/12 2006  . DISCONTD: sodium chloride 0.9 % injection 3 mL  3 mL Intravenous Q12H Rometta Emery, MD   3 mL at 01/15/12 0912  . DISCONTD: sodium chloride 0.9 % injection 3 mL  3 mL Intravenous Q12H Rometta Emery, MD   3 mL at 01/15/12 0912  . DISCONTD: sodium chloride 0.9 % injection 3 mL  3 mL Intravenous PRN Rometta Emery, MD        PE: General appearance:  alert, cooperative and no distress Lungs: clear to auscultation bilaterally Heart: regular rate and rhythm, S1, S2 normal, no murmur, click, rub or gallop Extremities: No LEE Pulses: radials 2+ and symmetric, 0+ left pt/dp, 1+ right dp/pt. Skin: warm and dry Neurologic: Grossly normal  Lab Results:   Basename 01/16/12 0510 01/15/12 2250 01/15/12 1512  WBC 5.6 4.6 4.9  HGB 13.4 12.4* 12.9*  HCT 39.1 35.8* 36.9*  PLT 104* 102* 104*   BMET  Basename 01/16/12 0510 01/15/12 1512 01/14/12 0234  NA 145 142 141  K 5.0 3.8 3.9  CL 110 106 107  CO2 25 26 21   GLUCOSE 117* 119* 128*  BUN 17 15 15   CREATININE 1.43* 1.39* 1.12  CALCIUM 9.2 8.9 8.8   PT/INR  Basename 01/16/12 0510 01/15/12 0511  LABPROT 14.9 14.5  INR 1.15 1.11   Lipid Panel     Component Value Date/Time   CHOL 141 01/14/2012 0600   TRIG 246* 01/14/2012 0600   HDL 30* 01/14/2012 0600   CHOLHDL 4.7 01/14/2012 0600   VLDL 49* 01/14/2012 0600   LDLCALC 62 01/14/2012 0600   Cardiac Panel (last 3 results)  Basename 01/14/12 1803 01/14/12 1048 01/14/12 0233  CKTOTAL 80 105 112  CKMB 7.2* 9.3* 9.2*  TROPONINI 0.67* 0.82* 0.79*  RELINDX RELATIVE INDEX IS INVALID 8.9* 8.2*     Study Conclusions  - Left ventricle: The cavity size was normal. There was mild concentric hypertrophy. Systolic function was mildly to moderately reduced. The estimated ejection fraction was in the range of 40% to 45%. There is akinesis and enhanced echogenicity of the inferior and inferoapical walls consistent with RCA territory infarct. There is a well-visuzlied1.5 cm round inferoapical mass which is not particularly echogenic and adherant to the inferoapical wall of the left ventricle. This is most consistent with thrombus. Doppler parameters are consistent with abnormal left ventricular relaxation (grade 1 diastolic dysfunction). The E/e' ratio is >15, suggesting markedly elevated LV filling pressure. - Aortic valve: Sclerosis  without stenosis. - Mitral valve: Calcified annulus. Mildly thickened leaflets . Mild regurgitation. - Left atrium: Moderately dilated. - Right atrium: The atrium was mildly dilated. - Inferior vena cava: The vessel was normal in size; the respirophasic diameter changes were in the normal range (= 50%); findings are consistent with normal central venous pressure. - Impressions: There is a well-visuzlied 1.5 cm round inferoapical mass which is not particularly echogenic and adherant to the inferoapical  wall of the left ventricle. This is most consistent with thrombus.  Assessment/Plan   Principal Problem:  *NSTEMI (non-ST elevated myocardial infarction) Active Problems:  H/O ascending aorta repair  HTN (hypertension)  Thrombocytopenia  Hyperglycemia  Tobacco abuse  Hematuria  Staghorn renal calculus  Left ventricular apical thrombus following MI, NSTEMI, 01/13/12  Hypotension  DM (diabetes mellitus)  Plan:  Per urology they will continue to monitor the kidney stone.  They recommend continuing with cardiac evaluation.  Heparin has been restarted.   2D echo shows EF of 40-45%.  1.5cm thrombus at the inferoapical wall of LV.   There is akinesis of the inferior and inferoapical walls consistent with RCA territory infarct.  SCr is elevated from 1.12 to 1.43 today.  May be related to stone.  Starting NS at 226ml/hr. On for left heart cath today.    LOS: 3 days    HAGER, BRYAN 01/16/2012 8:08 AM  I have seen and examined the patient along with Wilburt Finlay, PA NP.  I have reviewed the chart, notes and new data.  I agree with PA/NP's note.  Key new complaints: Continues to have intermittent angina even with the slightest activity; hematuria has resolved Key examination changes: absent left femoral pulse, 3+ right femoral pulse s/p Aobifem bypass. Key new findings / data: creat increased somewhat, note negative fluid balance  PLAN: Despite possibility of nephrotoxicity and recent  hematuria, the potential benefit of revascularization and the need for anticoagulation greatly outweigh the risks. Pan coronary angio and PCI as indicated. Prefer bare metal stent if possible to reduce duration of mandatory dual antiplatelet rx. Prefer radial approach if favorable anatomy (bedside Allen's test equivocal) due to prior aortobifemoral bypass.  Start IVF - NS @250  mL/h prior to cath. Hold lisinopril this AM.  Will need warfarin for at least 12 months.  Thurmon Fair, MD, [facility] [phone] 01/16/2012, 8:41 AM

## 2012-01-17 ENCOUNTER — Inpatient Hospital Stay (HOSPITAL_COMMUNITY): Payer: Medicare Other

## 2012-01-17 ENCOUNTER — Encounter (HOSPITAL_COMMUNITY): Payer: Medicare Other

## 2012-01-17 ENCOUNTER — Other Ambulatory Visit: Payer: Self-pay

## 2012-01-17 DIAGNOSIS — I2510 Atherosclerotic heart disease of native coronary artery without angina pectoris: Secondary | ICD-10-CM

## 2012-01-17 DIAGNOSIS — I255 Ischemic cardiomyopathy: Secondary | ICD-10-CM | POA: Diagnosis present

## 2012-01-17 DIAGNOSIS — N289 Disorder of kidney and ureter, unspecified: Secondary | ICD-10-CM | POA: Diagnosis not present

## 2012-01-17 DIAGNOSIS — J449 Chronic obstructive pulmonary disease, unspecified: Secondary | ICD-10-CM | POA: Diagnosis present

## 2012-01-17 LAB — GLUCOSE, CAPILLARY
Glucose-Capillary: 93 mg/dL (ref 70–99)
Glucose-Capillary: 125 mg/dL — ABNORMAL HIGH (ref 70–99)

## 2012-01-17 LAB — HEPARIN LEVEL (UNFRACTIONATED): Heparin Unfractionated: 0.5 IU/mL (ref 0.30–0.70)

## 2012-01-17 LAB — CBC
MCH: 33.5 pg (ref 26.0–34.0)
MCHC: 35 g/dL (ref 30.0–36.0)
Platelets: 99 10*3/uL — ABNORMAL LOW (ref 150–400)
RDW: 14 % (ref 11.5–15.5)

## 2012-01-17 MED ORDER — IOHEXOL 350 MG/ML SOLN
100.0000 mL | Freq: Once | INTRAVENOUS | Status: AC | PRN
  Administered 2012-01-17: 100 mL via INTRAVENOUS

## 2012-01-17 NOTE — Progress Notes (Signed)
Subjective:  No chest pain, c/o headache.  Objective:  Vital Signs in the last 24 hours: Temp:  [97.5 F (36.4 C)-98 F (36.7 C)] 97.8 F (36.6 C) (07/24 0416) Pulse Rate:  [58-79] 68  (07/24 0416) Resp:  [18-28] 23  (07/24 0416) BP: (105-125)/(52-79) 109/66 mmHg (07/24 0416) SpO2:  [94 %-100 %] 97 % (07/24 0416)  Intake/Output from previous day:  Intake/Output Summary (Last 24 hours) at 01/17/12 0803 Last data filed at 01/17/12 0700  Gross per 24 hour  Intake 1481.03 ml  Output   2100 ml  Net -618.97 ml      . albuterol  2.5 mg Nebulization Once  . aspirin  325 mg Oral Daily  . carvedilol  6.25 mg Oral BID WC  . Fluticasone-Salmeterol  1 puff Inhalation BID  . heparin      . heparin      . lidocaine      . midazolam      . nicotine  21 mg Transdermal Daily  . nitroGLYCERIN      . sodium chloride  3 mL Intravenous Q12H  . verapamil      . DISCONTD: lisinopril  2.5 mg Oral Daily  . DISCONTD: sodium chloride  3 mL Intravenous Q12H   Physical Exam: General appearance: alert, cooperative and no distress No JVD Lungs: clear to auscultation bilaterally; Decreased BS at  bases Heart: regular rate and rhythm Rt wrist without hematoma No edemal  Rate: 62  Rhythm: normal sinus rhythm and PVCs  Lab Results:  Basename 01/17/12 0535 01/16/12 0510  WBC 6.0 5.6  HGB 13.0 13.4  PLT 99* 104*    Basename 01/16/12 0510 01/15/12 1512  NA 145 142  K 5.0 3.8  CL 110 106  CO2 25 26  GLUCOSE 117* 119*  BUN 17 15  CREATININE 1.43* 1.39*    Basename 01/14/12 1803 01/14/12 1048  TROPONINI 0.67* 0.82*   Hepatic Function Panel No results found for this basename: PROT,ALBUMIN,AST,ALT,ALKPHOS,BILITOT,BILIDIR,IBILI in the last 72 hours No results found for this basename: CHOL in the last 72 hours  Basename 01/16/12 0510  INR 1.15    Imaging: Imaging results have been reviewed  Cardiac Studies:  Assessment/Plan:   Principal Problem:  *NSTEMI (non-ST elevated  myocardial infarction) Active Problems:  CAD (coronary artery disease), 3-V at cath 01/16/12  H/O ascending aorta repair  Thrombocytopenia  Left ventricular apical thrombus following MI, NSTEMI, 01/13/12  DM (diabetes mellitus)  COPD (chronic obstructive pulmonary disease)  Acute renal insufficiency, SCr up to 1.43  Ischemic cardiomyopathy, EF 40-45% 2D 01/15/12  HTN (hypertension)  Tobacco abuse  Staghorn renal calculus  Plan- Pt has been seen by Dr PVT who is going to review PFTs and make final recommendations. ACE has been stopped secondary to bump in SCr post cath to 1.43. Plts down to 99k, he is on heparin for LVT.    Corine Shelter PA-C 01/17/2012, 8:03 AM   Patient seen and examined. Agree with assessment and plan. Pt on heparin for multivessel CAD and thrombus awaiting CABG. For dopplers today. F/U platelet count and renal function.  Lennette Bihari, MD, [facility] 01/17/2012 8:16 AM

## 2012-01-17 NOTE — Progress Notes (Signed)
ANTICOAGULATION CONSULT NOTE - Follow Up Consult  Pharmacy Consult for heparin Indication: LV thrombus and 3V CAD by cath 9/23  Labs:  Basename 01/17/12 0535 01/16/12 2120 01/16/12 0510 01/15/12 2250 01/15/12 1512 01/15/12 0511 01/14/12 1803 01/14/12 1048  HGB 13.0 -- 13.4 -- -- -- -- --  HCT 37.1* -- 39.1 35.8* -- -- -- --  PLT 99* -- 104* 102* -- -- -- --  APTT -- -- -- -- -- -- -- --  LABPROT -- -- 14.9 -- -- 14.5 -- --  INR -- -- 1.15 -- -- 1.11 -- --  HEPARINUNFRC 0.50 0.36 0.36 -- -- -- -- --  CREATININE -- -- 1.43* -- 1.39* -- -- --  CKTOTAL -- -- -- -- -- -- 80 105  CKMB -- -- -- -- -- -- 7.2* 9.3*  TROPONINI -- -- -- -- -- -- 0.67* 0.82*    Assessment:  Mr. Meader has a large inferioapical LV thrombus on echo and significant 3V CAD per cath lab report on 7/23.  Heparin was resumed post cath and is now at goal this morning with HL 0.5.  His platelets remain low. His H/H is stable.  He has a kidney stone which has caused hematuria.  He is being evaluated for possible CABG.  Goal Heparin level 0.3-0.7 Platelet monitoring per protocol: yes  Plan: -Continue heparin at current rate of 1200 units/hr -Daily heparin level and CBC Herby Abraham, Pharm.D. [phone] 01/17/2012 8:58 AM

## 2012-01-17 NOTE — Consult Note (Signed)
[address]            [place] [address]          [phone]       Charles Hatfield [facility] Medical Record #[id number] Date of Birth: 09/21/1940  Referring: No ref. provider found Primary Care: DEFAULT,PROVIDER, MD  Chief Complaint:    Chief Complaint  Patient presents with  . Chest Pain    History of Present Illness:     71 year old Caucasian male smoker presented with unstable angina and positive cardiac enzymes. 2-D echo showed moderate LV dysfunction with a possible left ventricular mural thrombus. No significant valvular disease. Cardiac catheterization demonstrates severe three-vessel coronary disease with graftable vessels in the LAD, diagonal, and posterior descending circulation.  The patient has a history of peripheral vascular disease. He is status post aorto iliac bypass grafting at [facility] hospital by Dr. Mayford Knife over 10 years ago. 3 or 4 months after that operation he had a stroke affecting the right side of his body with some residual right-sided weakness. Carotid scans are pending.  The patient has a significant COPD history with active smoking. Point function testing shows FEV1 greater than 50% and DLCO greater than 50%. Chest x-ray shows some scarring and a CT scan of the chest is pending.  In August of last year the patient had some abdominal pain was found to have a staghorn renal calculus in the left renal pelvis. His current creatinine following cardiac catheterization is 1.4.   Current Activity/ Functional Status: The patient was fairly active until his MI 1-2 weeks ago and now he is doubly with dyspnea on exertion and decreased exercise tolerance. His old stroke did not require that he uses a walker or cane to ambulate.   Past Medical History  Diagnosis Date  . Hypertension   . Diabetes mellitus   . H/O ascending aorta repair   . Stroke   . Hematuria 01/15/2012  . Staghorn renal calculus 01/15/2012    . Left ventricular apical thrombus following MI, NSTEMI, 01/13/12 01/15/2012  . DM (diabetes mellitus) 01/15/2012      History  Smoking status  . Current Everyday Smoker -- 1.0 packs/day  Smokeless tobacco  . Not on file    History  Alcohol Use No    History   Social History  . Marital Status: Legally Separated    Spouse Name: N/A    Number of Children: N/A  . Years of Education: N/A   Occupational History  . Not on file.   Social History Main Topics  . Smoking status: Current Everyday Smoker -- 1.0 packs/day  . Smokeless tobacco: Not on file  . Alcohol Use: No  . Drug Use: No  . Sexually Active:    Other Topics Concern  . Not on file   Social History Narrative  . No narrative on file    No Known Allergies  Current Facility-Administered Medications  Medication Dose Route Frequency Provider Last Rate Last Dose  . 0.9 %  sodium chloride infusion   Intravenous Continuous Mihai Croitoru, MD 250 mL/hr at 01/16/12 0900    . 0.9 %  sodium chloride infusion  250 mL Intravenous Continuous Mihai Croitoru, MD 1 mL/hr at 01/16/12 2111 250 mL at 01/16/12 2111  . 0.9 %  sodium chloride infusion  1 mL/kg/hr Intravenous Continuous Mihai Croitoru, MD 65.3 mL/hr at 01/16/12  1245 1 mL/kg/hr at 01/16/12 1245  . acetaminophen (TYLENOL) suppository 650 mg  650 mg Rectal Q6H PRN Rometta Emery, MD      . acetaminophen (TYLENOL) tablet 650 mg  650 mg Oral Q4H PRN Mihai Croitoru, MD   650 mg at 01/17/12 1248  . albuterol (PROVENTIL) (5 MG/ML) 0.5% nebulizer solution 2.5 mg  2.5 mg Nebulization Q2H PRN Rometta Emery, MD      . albuterol (PROVENTIL) (5 MG/ML) 0.5% nebulizer solution 2.5 mg  2.5 mg Nebulization Once Alleen Borne, MD   2.5 mg at 01/16/12 1544  . aspirin tablet 325 mg  325 mg Oral Daily Rometta Emery, MD   325 mg at 01/17/12 0936  . carvedilol (COREG) tablet 6.25 mg  6.25 mg Oral BID WC Rometta Emery, MD   6.25 mg at 01/17/12 0749  . Fluticasone-Salmeterol  (ADVAIR) 250-50 MCG/DOSE inhaler 1 puff  1 puff Inhalation BID Kerin Perna, MD   1 puff at 01/17/12 0910  . heparin ADULT infusion 100 units/mL (25000 units/250 mL)  1,200 Units/hr Intravenous Continuous Herby Abraham, PHARMD 12 mL/hr at 01/16/12 1244 1,200 Units/hr at 01/16/12 1244  . morphine 2 MG/ML injection 2 mg  2 mg Intravenous Q4H PRN Rometta Emery, MD      . nicotine (NICODERM CQ - dosed in mg/24 hours) patch 21 mg  21 mg Transdermal Daily Rolan Lipa, NP   21 mg at 01/17/12 1000  . nitroGLYCERIN (NITROSTAT) SL tablet 0.4 mg  0.4 mg Sublingual Q5 Min x 3 PRN Theotis Burrow, MD   0.4 mg at 01/14/12 0038  . nitroGLYCERIN 0.2 mg/mL in dextrose 5 % infusion  2-200 mcg/min Intravenous Titrated Rometta Emery, MD   10 mcg/min at 01/14/12 2000  . ondansetron (ZOFRAN) injection 4 mg  4 mg Intravenous Q6H PRN Mihai Croitoru, MD      . ondansetron (ZOFRAN) tablet 4 mg  4 mg Oral Q6H PRN Rometta Emery, MD      . oxyCODONE-acetaminophen (PERCOCET/ROXICET) 5-325 MG per tablet 1-2 tablet  1-2 tablet Oral Q4H PRN Mihai Croitoru, MD      . sodium chloride 0.9 % injection 3 mL  3 mL Intravenous Q12H Mihai Croitoru, MD   3 mL at 01/17/12 1000  . sodium chloride 0.9 % injection 3 mL  3 mL Intravenous PRN Thurmon Fair, MD        Prescriptions prior to admission  Medication Sig Dispense Refill  . aspirin 325 MG tablet Take 325 mg by mouth daily.        History reviewed. No pertinent family history.   Review of Systems: The left limb of his aortoiliac bypass graft is occluded and he has some claudication in his left leg.    Cardiac Review of Systems: Y or N  Chest Pain [   y ]  Resting SOB [   ] Exertional SOB  Cove.Etienne  ]  Orthopnea [  ]   Pedal Edema [ n  ]    Palpitations [n  ] Syncope  [n ]   Presyncope [  n ]  General Review of Systems: [Y] = yes [  ]=no Constitional: recent weight change [  ]; anorexia [  ]; fatigue [  ]; nausea [  ]; night sweats [  ]; fever [  ];  or chills [  ];  Dental: poor dentition[  ]; Last Dentist visit:>70yr  Eye : blurred vision [  ]; diplopia [   ]; vision changes [  ];  Amaurosis fugax[nn  ]; Resp: cough [  ];  wheezing[  ];  hemoptysis[  ]; shortness of breath[  ]; paroxysmal nocturnal dyspnea[  ]; dyspnea on exertion[  ]; or orthopnea[  ];  GI:  gallstones[  ], vomiting[  ];  dysphagia[  ]; melena[ n ];  hematochezia [  ]; heartburn[  ];   Hx of  Colonoscopy[  ]; GU: kidney stones [  ]; hematuria[  ];   dysuria [  ];  nocturia[  ];  history of     obstruction [  ];             Skin: rash, swelling[  ];, hair loss[  ];  peripheral edema[n  ];  or itching[  ]; Musculosketetal: myalgias[  ];  joint swelling[  ];  joint erythema[  ];  joint pain[  ];  back pain[  ];  Heme/Lymph: bruising[  ];  bleeding[  ];  anemia[  ];  Neuro: TIA[  ];  headaches[  ];  stroke[  ];  vertigo[  ];  seizures[  ];   paresthesias[  ];  difficulty walking[  ];  Psych:depression[  ]; anxiety[  ];  Endocrine: diabetes[  ];  thyroid dysfunction[  ];  Immunizations: Flu [  ]; Pneumococcal[  ];  Other:  Physical Exam: BP 104/59  Pulse 57  Temp 97.7 F (36.5 C) (Oral)  Resp 27  Ht 5\' 6"  (1.676 m)  Wt 143 lb 15.4 oz (65.3 kg)  BMI 23.24 kg/m2  SpO2 97%  Gen.-Alert and responsive chronically ill-appearing HEENT-right facial droop upper dental plate poor mandibular dentition Neck-no JVD or bruit noted no adenopathy Thorax-distant breath sounds no deformity or tenderness scattered rhonchi Cardiac-regular rhythm no gallop no murmur Abdomen-soft without pulsatile mass Extremities-mild clubbing, no cyanosis, no edema, atrophic skin changes of lower extremities Vascular-nonpalpable pedal pulses no venous insufficiency of the legs Neurologic-right-sided weakness from old stroke right facial droop   Diagnostic Studies  & Laboratory data:   Chest x-ray abdominal CT scan and 2-D echo are reviewed. Coronary care grams are reviewed. Recent Radiology Findings:   No results found.    Recent Lab Findings: Lab Results  Component Value Date   WBC 6.0 01/17/2012   HGB 13.0 01/17/2012   HCT 37.1* 01/17/2012   PLT 99* 01/17/2012   GLUCOSE 117* 01/16/2012   CHOL 141 01/14/2012   TRIG 246* 01/14/2012   HDL 30* 01/14/2012   LDLCALC 62 01/14/2012   ALT 14 01/13/2012   AST 27 01/13/2012   NA 145 01/16/2012   K 5.0 01/16/2012   CL 110 01/16/2012   CREATININE 1.43* 01/16/2012   BUN 17 01/16/2012   CO2 25 01/16/2012   TSH 0.589 01/14/2012   INR 1.15 01/16/2012      Assessment / Plan:      Severe multivessel coronary disease with moderate LV dysfunction and a 71 year old with COPD from smoking, peripheral vascular disease and history of remote right body stroke. Pulmonary function testing initially show shows adequate pulmonary reserve for sternotomy. Carotid Doppler studies pending to assess carotid disease. Recommend pulmonary toilet and nebulized therapy to clean up his lungs before probable multivessel bypass grafting scheduled for Monday, July 29. Plan for surgery discussed with patient and son.      @me1 @ 01/17/2012 1:21 PM

## 2012-01-18 ENCOUNTER — Encounter: Payer: Self-pay | Admitting: Surgery

## 2012-01-18 DIAGNOSIS — I779 Disorder of arteries and arterioles, unspecified: Secondary | ICD-10-CM | POA: Diagnosis present

## 2012-01-18 DIAGNOSIS — I2510 Atherosclerotic heart disease of native coronary artery without angina pectoris: Secondary | ICD-10-CM

## 2012-01-18 DIAGNOSIS — R0989 Other specified symptoms and signs involving the circulatory and respiratory systems: Secondary | ICD-10-CM

## 2012-01-18 DIAGNOSIS — Z8673 Personal history of transient ischemic attack (TIA), and cerebral infarction without residual deficits: Secondary | ICD-10-CM

## 2012-01-18 DIAGNOSIS — I6529 Occlusion and stenosis of unspecified carotid artery: Secondary | ICD-10-CM

## 2012-01-18 LAB — BASIC METABOLIC PANEL
BUN: 16 mg/dL (ref 6–23)
Calcium: 8.8 mg/dL (ref 8.4–10.5)
GFR calc Af Amer: 64 mL/min — ABNORMAL LOW (ref >90)
GFR calc non Af Amer: 55 mL/min — ABNORMAL LOW (ref >90)
Glucose, Bld: 113 mg/dL — ABNORMAL HIGH (ref 70–99)
Potassium: 4 mEq/L (ref 3.5–5.1)
Sodium: 143 mEq/L (ref 135–145)

## 2012-01-18 LAB — GLUCOSE, CAPILLARY
Glucose-Capillary: 100 mg/dL — ABNORMAL HIGH (ref 70–99)
Glucose-Capillary: 141 mg/dL — ABNORMAL HIGH (ref 70–99)

## 2012-01-18 LAB — CBC
HCT: 38.6 % — ABNORMAL LOW (ref 39.0–52.0)
MCHC: 35 g/dL (ref 30.0–36.0)
Platelets: 113 10*3/uL — ABNORMAL LOW (ref 150–400)
RDW: 14 % (ref 11.5–15.5)
WBC: 7.5 10*3/uL (ref 4.0–10.5)

## 2012-01-18 MED ORDER — ALPRAZOLAM 0.25 MG PO TABS
0.2500 mg | ORAL_TABLET | ORAL | Status: DC | PRN
  Administered 2012-01-20 – 2012-01-21 (×4): 0.25 mg via ORAL
  Filled 2012-01-18 (×4): qty 1

## 2012-01-18 MED ORDER — POLYETHYLENE GLYCOL 3350 17 G PO PACK
17.0000 g | PACK | Freq: Every day | ORAL | Status: DC
  Administered 2012-01-18 – 2012-01-21 (×4): 17 g via ORAL
  Filled 2012-01-18 (×5): qty 1

## 2012-01-18 NOTE — Consult Note (Signed)
[facility] [place]  Patient name: Charles Hatfield MRN: [id number] DOB: 1940/08/06 Sex: male  REASON FOR CONSULT: bilateral carotid disease  HPI: Charles Hatfield is a 71 y.o. male who was admitted on 01/14/2012 with chest pain. He was ruled in for a non-ST elevation myocardial infarction. He has had some postinfarction angina and is found to have multivessel coronary artery disease. He has moderate LV dysfunction and an LV apical thrombus. He is being considered for CABG and preoperative workup included a carotid duplex scan which showed bilateral carotid disease. Vascular surgery was consult for further recommendations.  The patient states that he had a stroke in 1999 associated with significant right-sided weakness. This involved both his upper extremity and lower extremity and he has also had significant expressive aphasia since his stroke. He had been making progress although the son notes that he has been weaker on the right side recently although he's had no acute change in symptoms. He denies any history of amaurosis fugax. He's had persistent expressive aphasia since his stroke.   Past Medical History  Diagnosis Date  . Hypertension   . Diabetes mellitus   . H/O ascending aorta repair   . Stroke   . Hematuria 01/15/2012  . Staghorn renal calculus 01/15/2012  . Left ventricular apical thrombus following MI, NSTEMI, 01/13/12 01/15/2012  . DM (diabetes mellitus) 01/15/2012    History reviewed. No pertinent family history.  SOCIAL HISTORY: History  Substance Use Topics  . Smoking status: Current Everyday Smoker -- 1.0 packs/day  . Smokeless tobacco: Not on file  . Alcohol Use: No    No Known Allergies  Current Facility-Administered Medications  Medication Dose Route Frequency Provider Last Rate Last Dose  . 0.9 %  sodium chloride infusion   Intravenous Continuous Mihai Croitoru, MD 250 mL/hr at 01/16/12 0900    . 0.9 %  sodium chloride infusion  250 mL  Intravenous Continuous Mihai Croitoru, MD 1 mL/hr at 01/16/12 2111 250 mL at 01/16/12 2111  . acetaminophen (TYLENOL) suppository 650 mg  650 mg Rectal Q6H PRN Rometta Emery, MD      . acetaminophen (TYLENOL) tablet 650 mg  650 mg Oral Q4H PRN Thurmon Fair, MD   650 mg at 01/18/12 4098  . albuterol (PROVENTIL) (5 MG/ML) 0.5% nebulizer solution 2.5 mg  2.5 mg Nebulization Q2H PRN Rometta Emery, MD      . aspirin tablet 325 mg  325 mg Oral Daily Rometta Emery, MD   325 mg at 01/18/12 0941  . carvedilol (COREG) tablet 6.25 mg  6.25 mg Oral BID WC Rometta Emery, MD   6.25 mg at 01/18/12 1645  . Fluticasone-Salmeterol (ADVAIR) 250-50 MCG/DOSE inhaler 1 puff  1 puff Inhalation BID Kerin Perna, MD   1 puff at 01/18/12 0853  . heparin ADULT infusion 100 units/mL (25000 units/250 mL)  1,350 Units/hr Intravenous Continuous Mihai Croitoru, MD 13.5 mL/hr at 01/18/12 0900 1,350 Units/hr at 01/18/12 0900  . morphine 2 MG/ML injection 2 mg  2 mg Intravenous Q4H PRN Rometta Emery, MD   2 mg at 01/17/12 1609  . nicotine (NICODERM CQ - dosed in mg/24 hours) patch 21 mg  21 mg Transdermal Daily Rolan Lipa, NP   21 mg at 01/18/12 0941  . nitroGLYCERIN (NITROSTAT) SL tablet 0.4 mg  0.4 mg Sublingual Q5 Min x 3 PRN Theotis Burrow, MD   0.4 mg at 01/14/12 0038  . nitroGLYCERIN 0.2 mg/mL in  dextrose 5 % infusion  2-200 mcg/min Intravenous Titrated Rometta Emery, MD   10 mcg/min at 01/14/12 2000  . ondansetron (ZOFRAN) injection 4 mg  4 mg Intravenous Q6H PRN Mihai Croitoru, MD      . ondansetron (ZOFRAN) tablet 4 mg  4 mg Oral Q6H PRN Rometta Emery, MD      . oxyCODONE-acetaminophen (PERCOCET/ROXICET) 5-325 MG per tablet 1-2 tablet  1-2 tablet Oral Q4H PRN Mihai Croitoru, MD      . sodium chloride 0.9 % injection 3 mL  3 mL Intravenous Q12H Mihai Croitoru, MD   3 mL at 01/18/12 0941  . sodium chloride 0.9 % injection 3 mL  3 mL Intravenous PRN Thurmon Fair, MD         REVIEW OF SYSTEMS: Arly.Keller ] denotes positive finding; [  ] denotes negative finding CARDIOVASCULAR:  Arly.Keller ] chest pain   Arly.Keller ] chest pressure   [ ]  palpitations   [ ]  orthopnea   Arly.Keller ] dyspnea on exertion   [ ]  claudication   [ ]  rest pain   [ ]  DVT   [ ]  phlebitis PULMONARY:   [ ]  productive cough   [ ]  asthma   [ ]  wheezing NEUROLOGIC:   Arly.Keller ] weakness  Arly.Keller ] paresthesias  [ ]  aphasia  [ ]  amaurosis  [ ]  dizziness HEMATOLOGIC:   [ ]  bleeding problems   [ ]  clotting disorders MUSCULOSKELETAL:  Arly.Keller ] joint pain   [ ]  joint swelling [ ]  leg swelling GASTROINTESTINAL: [ ]   blood in stool  [ ]   hematemesis GENITOURINARY:  [ ]   dysuria  [ ]   hematuria PSYCHIATRIC:  [ ]  history of major depression INTEGUMENTARY:  [ ]  rashes  [ ]  ulcers CONSTITUTIONAL:  [ ]  fever   [ ]  chills  PHYSICAL EXAM: Filed Vitals:   01/18/12 0730 01/18/12 1225 01/18/12 1600 01/18/12 1645  BP: 118/69 116/83 92/52 102/70  Pulse: 66 67 63 70  Temp: 98 F (36.7 C) 97.8 F (36.6 C) 97.9 F (36.6 C)   TempSrc: Oral Oral Oral   Resp: 28 23 25    Height:      Weight:      SpO2: 96% 97% 96%    Body mass index is 23.24 kg/(m^2). GENERAL: The patient is a well-nourished male, in no acute distress. The vital signs are documented above. CARDIOVASCULAR: There is a regular rate and rhythm without significant murmur appreciated. I do not detect carotid bruits. PULMONARY: There is good air exchange bilaterally without wheezing or rales. ABDOMEN: Soft and non-tender with normal pitched bowel sounds. I do not appreciate an abdominal aortic aneurysm. MUSCULOSKELETAL: There are no major deformities or cyanosis. NEUROLOGIC: he has an expressive aphasia. He has fairly good strength in the upper extremity and lower extremity on the right. The right side is clumsy compared to the left. SKIN: There are no ulcers or rashes noted. PSYCHIATRIC: The patient has a normal affect.  DATA:  Lab Results  Component Value Date   WBC 7.5 01/18/2012    HGB 13.5 01/18/2012   HCT 38.6* 01/18/2012   MCV 95.3 01/18/2012   PLT 113* 01/18/2012   Lab Results  Component Value Date   NA 143 01/18/2012   K 4.0 01/18/2012   CL 107 01/18/2012   CO2 25 01/18/2012   Lab Results  Component Value Date   CREATININE 1.28 01/18/2012   Lab Results  Component Value Date   INR 1.15 01/16/2012  INR 1.11 01/15/2012   INR 1.01 02/24/2011   No results found for this basename: HGBA1C   CBG (last 3)   Basename 01/18/12 1224 01/18/12 0734 01/17/12 2120  GLUCAP 100* 97 108*   CAROTID DUPLEX: Shows a critical left carotid stenosis with a peak systolic velocity of 515 cm/s and an end-diastolic velocity of 151 cm/s. The plaque extends very high based on the duplex. He also has a 60-79% right carotid stenosis.  CT CHEST: He has extensive atherosclerotic disease of aorta and arch vessels including a stenosis of the proximal left vertebral artery.   MEDICAL ISSUES: BILATERAL CAROTID DISEASE WITH SEVERE LEFT CAROTID STENOSIS: This patient has a critical left carotid stenosis of greater than 90%. The plaque appears to extend fairly high and for this reason I would recommend a CT angiogram to further assess the plaque to be sure that it is surgically accessible. If this is surgically accessible then given his previous history of left brain stroke and severe left carotid stenosis, I would recommend combined left carotid endarterectomy at the same time of his CABG in order to lower his risk of perioperative stroke.  DICKSON,CHRISTOPHER S [facility]: [phone]

## 2012-01-18 NOTE — Progress Notes (Addendum)
Pre-op Cardiac Surgery  Carotid Findings:  Right: 60-79% ICA stenosis, highest end of scale.  Left: >80% ICA stenosis (500/150). Bilateral vertebral artery flow is  Antegrade.  Called critical results to Dr. Donata Clay @ 15:20.  Upper Extremity Right Left  Brachial Pressures 95 T 108 T  Radial Waveforms T T  Ulnar Waveforms T T  Palmar Arch (Allen's Test) * *   Findings:  *Doppler waveforms obliterate with ulnar and remain normal with radial compressions bilaterally.    Lower  Extremity Right Left  Dorsalis Pedis    Anterior Tibial 43 DM 59 M  Posterior Tibial 64 M 67 DM  Ankle/Brachial Indices 0.59 0.62    Findings:  ABI indicates a moderate reduction in arterial flow bilaterally.

## 2012-01-18 NOTE — Progress Notes (Signed)
[facility]  DAILY PROGRESS NOTE   Subjective:  Continues to have angina at rest requiring morphine (last night at 9 PM). Considerable atherosclerotic disease of the aorta and 70% stenosis of the dominant left vertebral artery.  Objective:  Temp:  [97.2 F (36.2 C)-98 F (36.7 C)] 98 F (36.7 C) (07/25 0730) Pulse Rate:  [57-87] 66  (07/25 0730) Resp:  [16-28] 28  (07/25 0730) BP: (95-136)/(59-81) 118/69 mmHg (07/25 0730) SpO2:  [95 %-100 %] 96 % (07/25 0730) Weight change:   Intake/Output from previous day: 07/24 0701 - 07/25 0700 In: 1420 [P.O.:1200; I.V.:220] Out: 1776 [Urine:1775; Stool:1]  Intake/Output from this shift: Total I/O In: -  Out: 250 [Urine:250]  Medications: Current Facility-Administered Medications  Medication Dose Route Frequency Provider Last Rate Last Dose  . 0.9 %  sodium chloride infusion   Intravenous Continuous Sinthia Karabin, MD 250 mL/hr at 01/16/12 0900    . 0.9 %  sodium chloride infusion  250 mL Intravenous Continuous Adlee Paar, MD 1 mL/hr at 01/16/12 2111 250 mL at 01/16/12 2111  . acetaminophen (TYLENOL) suppository 650 mg  650 mg Rectal Q6H PRN Rometta Emery, MD      . acetaminophen (TYLENOL) tablet 650 mg  650 mg Oral Q4H PRN Thurmon Fair, MD   650 mg at 01/18/12 0981  . albuterol (PROVENTIL) (5 MG/ML) 0.5% nebulizer solution 2.5 mg  2.5 mg Nebulization Q2H PRN Rometta Emery, MD      . aspirin tablet 325 mg  325 mg Oral Daily Rometta Emery, MD   325 mg at 01/17/12 0936  . carvedilol (COREG) tablet 6.25 mg  6.25 mg Oral BID WC Rometta Emery, MD   6.25 mg at 01/18/12 0801  . Fluticasone-Salmeterol (ADVAIR) 250-50 MCG/DOSE inhaler 1 puff  1 puff Inhalation BID Kerin Perna, MD   1 puff at 01/17/12 2006  . heparin ADULT infusion 100 units/mL (25000 units/250 mL)  1,350 Units/hr Intravenous Continuous Dallyn Bergland, MD 13.5 mL/hr at 01/18/12 0654 1,350 Units/hr at 01/18/12 0654  . iohexol  (OMNIPAQUE) 350 MG/ML injection 100 mL  100 mL Intravenous Once PRN Medication Radiologist, MD   100 mL at 01/17/12 1401  . morphine 2 MG/ML injection 2 mg  2 mg Intravenous Q4H PRN Rometta Emery, MD   2 mg at 01/17/12 1609  . nicotine (NICODERM CQ - dosed in mg/24 hours) patch 21 mg  21 mg Transdermal Daily Rolan Lipa, NP   21 mg at 01/17/12 1000  . nitroGLYCERIN (NITROSTAT) SL tablet 0.4 mg  0.4 mg Sublingual Q5 Min x 3 PRN Theotis Burrow, MD   0.4 mg at 01/14/12 0038  . nitroGLYCERIN 0.2 mg/mL in dextrose 5 % infusion  2-200 mcg/min Intravenous Titrated Rometta Emery, MD   10 mcg/min at 01/14/12 2000  . ondansetron (ZOFRAN) injection 4 mg  4 mg Intravenous Q6H PRN Mercy Leppla, MD      . ondansetron (ZOFRAN) tablet 4 mg  4 mg Oral Q6H PRN Rometta Emery, MD      . oxyCODONE-acetaminophen (PERCOCET/ROXICET) 5-325 MG per tablet 1-2 tablet  1-2 tablet Oral Q4H PRN Mandel Seiden, MD      . sodium chloride 0.9 % injection 3 mL  3 mL Intravenous Q12H Davonne Jarnigan, MD   3 mL at 01/17/12 2355  . sodium chloride 0.9 % injection 3 mL  3 mL Intravenous PRN Thurmon Fair, MD  Physical Exam: General appearance: alert, cooperative and no distress  Lungs: clear to auscultation bilaterally  Heart: regular rate and rhythm, S1, S2 normal, no murmur, click, rub or gallop  Extremities: No LEE  Pulses: radials 2+ and symmetric, 0+ left pt/dp, 1+ right dp/pt. Carotid bruits, faint Skin: warm and dry  Neurologic: Grossly normal   Lab Results: Results for orders placed during the hospital encounter of 01/13/12 (from the past 48 hour(s))  GLUCOSE, CAPILLARY     Status: Normal   Collection Time   01/16/12 11:22 AM      Component Value Range Comment   Glucose-Capillary 85  70 - 99 mg/dL   BLOOD GAS, ARTERIAL     Status: Abnormal   Collection Time   01/16/12  3:29 PM      Component Value Range Comment   FIO2 0.21      pH, Arterial 7.376  7.350 - 7.450    pCO2  arterial 37.8  35.0 - 45.0 mmHg    pO2, Arterial 85.9  80.0 - 100.0 mmHg    Bicarbonate 21.6  20.0 - 24.0 mEq/L    TCO2 22.8  0 - 100 mmol/L    Acid-base deficit 2.7 (*) 0.0 - 2.0 mmol/L    O2 Saturation 96.7      Patient temperature 98.6      Collection site LEFT RADIAL      Drawn by [id number]      Sample type ARTERIAL DRAW      Allens test (pass/fail) PASS  PASS   GLUCOSE, CAPILLARY     Status: Abnormal   Collection Time   01/16/12  5:16 PM      Component Value Range Comment   Glucose-Capillary 110 (*) 70 - 99 mg/dL   GLUCOSE, CAPILLARY     Status: Abnormal   Collection Time   01/16/12  9:08 PM      Component Value Range Comment   Glucose-Capillary 100 (*) 70 - 99 mg/dL   HEPARIN LEVEL (UNFRACTIONATED)     Status: Normal   Collection Time   01/16/12  9:20 PM      Component Value Range Comment   Heparin Unfractionated 0.36  0.30 - 0.70 IU/mL   CBC     Status: Abnormal   Collection Time   01/17/12  5:35 AM      Component Value Range Comment   WBC 6.0  4.0 - 10.5 K/uL    RBC 3.88 (*) 4.22 - 5.81 MIL/uL    Hemoglobin 13.0  13.0 - 17.0 g/dL    HCT 44.0 (*) 10.2 - 52.0 %    MCV 95.6  78.0 - 100.0 fL    MCH 33.5  26.0 - 34.0 pg    MCHC 35.0  30.0 - 36.0 g/dL    RDW 72.5  36.6 - 44.0 %    Platelets 99 (*) 150 - 400 K/uL CONSISTENT WITH PREVIOUS RESULT  HEPARIN LEVEL (UNFRACTIONATED)     Status: Normal   Collection Time   01/17/12  5:35 AM      Component Value Range Comment   Heparin Unfractionated 0.50  0.30 - 0.70 IU/mL   GLUCOSE, CAPILLARY     Status: Normal   Collection Time   01/17/12  7:55 AM      Component Value Range Comment   Glucose-Capillary 93  70 - 99 mg/dL   GLUCOSE, CAPILLARY     Status: Abnormal   Collection Time   01/17/12 11:50 AM  Component Value Range Comment   Glucose-Capillary 101 (*) 70 - 99 mg/dL   GLUCOSE, CAPILLARY     Status: Abnormal   Collection Time   01/17/12  3:50 PM      Component Value Range Comment   Glucose-Capillary 125 (*) 70 - 99 mg/dL    GLUCOSE, CAPILLARY     Status: Abnormal   Collection Time   01/17/12  9:20 PM      Component Value Range Comment   Glucose-Capillary 108 (*) 70 - 99 mg/dL    Comment 1 Notify RN      Comment 2 Documented in Chart     CBC     Status: Abnormal   Collection Time   01/18/12  4:57 AM      Component Value Range Comment   WBC 7.5  4.0 - 10.5 K/uL    RBC 4.05 (*) 4.22 - 5.81 MIL/uL    Hemoglobin 13.5  13.0 - 17.0 g/dL    HCT 08.6 (*) 57.8 - 52.0 %    MCV 95.3  78.0 - 100.0 fL    MCH 33.3  26.0 - 34.0 pg    MCHC 35.0  30.0 - 36.0 g/dL    RDW 46.9  62.9 - 52.8 %    Platelets 113 (*) 150 - 400 K/uL CONSISTENT WITH PREVIOUS RESULT  HEPARIN LEVEL (UNFRACTIONATED)     Status: Abnormal   Collection Time   01/18/12  4:57 AM      Component Value Range Comment   Heparin Unfractionated 0.22 (*) 0.30 - 0.70 IU/mL   BASIC METABOLIC PANEL     Status: Abnormal   Collection Time   01/18/12  4:57 AM      Component Value Range Comment   Sodium 143  135 - 145 mEq/L    Potassium 4.0  3.5 - 5.1 mEq/L    Chloride 107  96 - 112 mEq/L    CO2 25  19 - 32 mEq/L    Glucose, Bld 113 (*) 70 - 99 mg/dL    BUN 16  6 - 23 mg/dL    Creatinine, Ser 4.13  0.50 - 1.35 mg/dL    Calcium 8.8  8.4 - 24.4 mg/dL    GFR calc non Af Amer 55 (*) >90 mL/min    GFR calc Af Amer 64 (*) >90 mL/min   GLUCOSE, CAPILLARY     Status: Normal   Collection Time   01/18/12  7:34 AM      Component Value Range Comment   Glucose-Capillary 97  70 - 99 mg/dL     Imaging: Ct Angio Chest W/cm &/or Wo Cm  01/17/2012  *RADIOLOGY REPORT*  Clinical Data: Severe COPD.  Aortic disease.  Possible heart surgery.  CT ANGIOGRAPHY CHEST  Technique:  Multidetector CT imaging of the chest using the standard protocol during bolus administration of intravenous contrast. Multiplanar reconstructed images including MIPs were obtained and reviewed to evaluate the vascular anatomy.  Contrast: OMNIPAQUE IOHEXOL 350 MG/ML SOLN  Comparison: No priors.   Findings:  Mediastinum: Heart size is mildly enlarged with probable mild left ventricular dilatation.  Notably, the apex of the left ventricle appears mildly thinned and slightly rounded in appearance, and there is a 2 cm filling defect in the apex, favored to represent left ventricular apical thrombus. There is atherosclerosis of the thoracic aorta, the great vessels of the mediastinum and the coronary arteries, including calcified atherosclerotic plaque in the left main, left anterior descending, left circumflex and  right coronary arteries. Thickening and calcification of the aortic valve. There is no significant pericardial fluid, thickening or pericardial calcification. No pathologically enlarged mediastinal or hilar lymph nodes.   .  No acute abnormality of the thoracic aorta; specifically, no aneurysm or dissection.  Extensive atherosclerotic plaque is also noted within the great vessels of the mediastinum, and notably, there appears to be a high-grade stenosis at the ostium of the left vertebral artery (likely greater than 70% stenosis).  Importantly, the right vertebral artery is very diminutive and the left vertebral artery appears to be dominant.  Lungs/Pleura: There is a background of very mild patchy ground- glass attenuation and interlobular septal thickening in the lungs bilaterally, favored to represent mild interstitial pulmonary edema.  Associated with this there is some mild diffuse thickening of the peribronchovascular interstitium.  Trace bilateral pleural effusions (right greater than left).  There are a few scattered calcified pulmonary nodules compatible with calcified granulomas. No other larger suspicious appearing pulmonary nodules or masses are otherwise identified.  Upper Abdomen: Subcentimeter low-attenuation lesion in the upper pole of the right kidney. The large 1.3 x 2.8 cm stone in the left renal collecting system.  The left renal collecting system demonstrates some mild urothelial  enhancement, likely chronic related to the indwelling stone.  Numerous tiny calcifications throughout the liver likely represent small calcified granulomas. Extensive atherosclerosis of the visualized abdominal vasculature.  Musculoskeletal: There are no aggressive appearing lytic or blastic lesions noted in the visualized portions of the skeleton.  IMPRESSION: 1.  Left ventricular dilatation with a thinned out and slightly rounded appearance of the apical wall segments (suggesting ischemia and/or scarring from prior myocardial infarction).  Importantly, there is a 2 cm filling defect in the apex of the left ventricle, most likely to represent thrombus. 2.  In addition, there is extensive atherosclerosis of the aorta and arch vessels, including a high-grade (likely greater than 70% stenosis) lesion at the ostium of the left vertebral artery. Notably, the patient is left vertebral artery dominant with a very diminutive right vertebral artery. 3. Atherosclerosis, including left main and three-vessel coronary artery disease. 4.  The appearance of the lungs suggests mild interstitial pulmonary edema.  In addition, there are trace bilateral pleural effusions layering dependently. 5.  Large 1.3 x 2.8 cm calculus in the left renal collecting system. 6.  Additional incidental findings, as above.  These results were called by telephone on 01/17/2012 at 02:22 p.m. to Dr. Donata Clay, who verbally acknowledged these results.  Original Report Authenticated By: Florencia Reasons, M.D.    Assessment:  1. Principal Problem: 2.  *NSTEMI (non-ST elevated myocardial infarction) 3. Active Problems: 4.  H/O ascending aorta repair 5.  HTN (hypertension) 6.  Thrombocytopenia 7.  Tobacco abuse 8.  Staghorn renal calculus 9.  Left ventricular apical thrombus following MI, NSTEMI, 01/13/12 10.  DM (diabetes mellitus) 11.  CAD (coronary artery disease), 3-V at cath 01/16/12 12.  COPD (chronic obstructive pulmonary disease) 13.   Acute renal insufficiency, SCr up to 1.43 14.  Ischemic cardiomyopathy, EF 40-45% 2D 01/15/12 15. LV apical thrombus 16. Carotid and vertebral artery disease and history of stroke  Plan:  1. High risk clinical situation with ongoing postinfarction angina, multivessel CAD with occluded RCA and LCX and >90% proximal LAD stenosis, depressed LVEF.  CABG is fraught with increased risk of stroke, although COPD not as bad as thought and renal function iimproved.  Extremely high jeopardy risk with LAD PCI - if vessel occludes, he has  no chance of survival.  Due to PAD, there is no option for IABP or other unloading device.  Without revascularization, prognosis is grim, but all revascularization procedures bear increased risk. Will ask Dr. Allyson Sabal and Dr. Donata Clay to compare options.  Time Spent Directly with Patient:  30 minutes  Length of Stay:  LOS: 5 days    Harrie Cazarez 01/18/2012, 8:14 AM

## 2012-01-18 NOTE — Progress Notes (Signed)
ANTICOAGULATION CONSULT NOTE - Follow Up Consult  Pharmacy Consult for Heparin Indication: CAD, LV thrombus  No Known Allergies  Patient Measurements: Height: 5\' 6"  (167.6 cm) Weight: 143 lb 15.4 oz (65.3 kg) IBW/kg (Calculated) : 63.8  Heparin Dosing Weight: 65.3 kg  Vital Signs: Temp: 97.8 F (36.6 C) (07/25 1225) Temp src: Oral (07/25 1225) BP: 116/83 mmHg (07/25 1225) Pulse Rate: 67  (07/25 1225)  Labs:  Basename 01/18/12 1440 01/18/12 0457 01/17/12 0535 01/16/12 0510  HGB -- 13.5 13.0 --  HCT -- 38.6* 37.1* 39.1  PLT -- 113* 99* 104*  APTT -- -- -- --  LABPROT -- -- -- 14.9  INR -- -- -- 1.15  HEPARINUNFRC 0.46 0.22* 0.50 --  CREATININE -- 1.28 -- 1.43*  CKTOTAL -- -- -- --  CKMB -- -- -- --  TROPONINI -- -- -- --    Estimated Creatinine Clearance: 48.5 ml/min (by C-G formula based on Cr of 1.28).   Medications:  Infusions:    . sodium chloride 250 mL/hr at 01/16/12 0900  . sodium chloride 250 mL (01/16/12 2111)  . heparin 1,350 Units/hr (01/18/12 0900)  . nitroGLYCERIN Stopped (01/16/12 0935)    Assessment: Mr. Decesare has a large inferioapical LV thrombus on echo and significant 3V CAD per cath lab report on 7/23. Heparin was resumed post cath. He is being evaluated for possible CABG 7/29.  Heparin level currently therapeutic on 1350 units/hr.  Goal of Therapy:  Heparin level 0.3-0.7 units/ml Monitor platelets by anticoagulation protocol: Yes   Plan:  1. Continue IV heparin at current rate. 2. Continue daily heparin level and CBC. 3. F/U plans for CABG.  Tomoko Sandra, Gwenlyn Found 01/18/2012,3:32 PM

## 2012-01-18 NOTE — Progress Notes (Signed)
ANTICOAGULATION CONSULT NOTE - Follow Up Consult  Pharmacy Consult for heparin Indication: LV thrombus and 3V CAD by cath 9/23  Wt: 65.3 kg  Labs:  Basename 01/18/12 0457 01/17/12 0535 01/16/12 2120 01/16/12 0510 01/15/12 1512  HGB 13.5 13.0 -- -- --  HCT 38.6* 37.1* -- 39.1 --  PLT 113* 99* -- 104* --  APTT -- -- -- -- --  LABPROT -- -- -- 14.9 --  INR -- -- -- 1.15 --  HEPARINUNFRC 0.22* 0.50 0.36 -- --  CREATININE 1.28 -- -- 1.43* 1.39*  CKTOTAL -- -- -- -- --  CKMB -- -- -- -- --  TROPONINI -- -- -- -- --    Assessment:  Mr. Gin has a large inferioapical LV thrombus on echo and significant 3V CAD per cath lab report on 7/23.  Heparin was resumed post cath. He is being evaluated for possible CABG. Heparin level (0.22) is below-goal on 1200 units/hr. No problem with line/infusion per RN.   Goal Heparin level 0.3-0.7 Platelet monitoring per protocol: yes  Plan: 1. Increase IV heparin to 1350 units/hr. 2. Heparin level in 8 hours.   Lorre Munroe, PharmD 01/18/2012 6:45 AM

## 2012-01-19 ENCOUNTER — Inpatient Hospital Stay (HOSPITAL_COMMUNITY): Payer: Medicare Other

## 2012-01-19 LAB — BASIC METABOLIC PANEL
Calcium: 9.1 mg/dL (ref 8.4–10.5)
Creatinine, Ser: 1.14 mg/dL (ref 0.50–1.35)
GFR calc Af Amer: 73 mL/min — ABNORMAL LOW (ref >90)
GFR calc non Af Amer: 63 mL/min — ABNORMAL LOW (ref >90)
Sodium: 142 mEq/L (ref 135–145)

## 2012-01-19 LAB — CBC
MCH: 33.4 pg (ref 26.0–34.0)
MCHC: 35.3 g/dL (ref 30.0–36.0)
Platelets: 105 10*3/uL — ABNORMAL LOW (ref 150–400)

## 2012-01-19 LAB — HEPARIN LEVEL (UNFRACTIONATED): Heparin Unfractionated: 0.54 IU/mL (ref 0.30–0.70)

## 2012-01-19 LAB — GLUCOSE, CAPILLARY: Glucose-Capillary: 89 mg/dL (ref 70–99)

## 2012-01-19 LAB — ABO/RH: ABO/RH(D): O POS

## 2012-01-19 MED ORDER — ZOLPIDEM TARTRATE 5 MG PO TABS: 5.0000 mg | ORAL_TABLET | Freq: Every evening | ORAL | Status: DC | PRN

## 2012-01-19 MED ORDER — IOHEXOL 350 MG/ML SOLN
50.0000 mL | Freq: Once | INTRAVENOUS | Status: AC | PRN
  Administered 2012-01-19: 50 mL via INTRAVENOUS

## 2012-01-19 NOTE — Progress Notes (Signed)
This was an attempted follow-up visit.  Pt was sleeping.  Pt's son spoke to me in the hallway and confirmed that pt did want to speak to a chaplain but was resting so peacefully pt's son did not want to wake him.  I explained that when pt was ready all he had to do was ask the nurse to page the on-call chaplain.  Pt's son was thankful.  Stated that his father was a very private person and was concerned about the upcoming surgery.  Please page the on-call chaplain when pt requests one.  I will continue to follow. Dellie Catholic  [phone] personal pager   [phone] oncall pager

## 2012-01-19 NOTE — Progress Notes (Signed)
This visit was in response to a page from the nurse.  Pt was sitting up in chair while staff was changing bed linens.  Pt looked tearful as he told me about his open heart surgery next Monday.  His son entered the room at this time.  Pt does want to talk to a chaplain but when things are quieter.  Pt's son followed me into the hall to request I come back later. Pt's son explained that pt has a difficult diagnosis and a frightening prognosis.  I will follow-up when there is less activity in the room. Please page if chaplain assistance is needed. Dellie Catholic  [phone] oncall pager  01/19/12 [phone]  Clinical Encounter Type  Visited With Patient;Family  Visit Type Initial;Critical Care  Referral From Nurse  Consult/Referral To Chaplain  Spiritual Encounters  Spiritual Needs Emotional;Grief support  Stress Factors  Patient Stress Factors Major life changes;Loss of control;Health changes  Family Stress Factors Major life changes;Loss of control;Lack of knowledge

## 2012-01-19 NOTE — Progress Notes (Signed)
Patient's advance directive was notarized, and a copy is in chart.   Rozetta Nunnery MSW, [employer] [phone]

## 2012-01-19 NOTE — Progress Notes (Signed)
Brought down for ct scan of the neck and back after 15 min without any incidence.

## 2012-01-19 NOTE — Progress Notes (Signed)
[facility]  DAILY PROGRESS NOTE   Subjective:   Short episode of chest pain last night, relieved with morphine. He continues to have hematuria on heparin from his large left renal calculus.  Objective:  Temp:  [97.8 F (36.6 C)-98.2 F (36.8 C)] 98.2 F (36.8 C) (07/26 0730) Pulse Rate:  [63-70] 65  (07/26 0730) Resp:  [18-25] 23  (07/26 0730) BP: (92-127)/(52-93) 127/93 mmHg (07/26 0730) SpO2:  [95 %-97 %] 95 % (07/26 0730) Weight change:   Intake/Output from previous day: 07/25 0701 - 07/26 0700 In: 413.5 [I.V.:413.5] Out: 2315 [Urine:2315]  Intake/Output from this shift:    Medications: Current Facility-Administered Medications  Medication Dose Route Frequency Provider Last Rate Last Dose  . 0.9 %  sodium chloride infusion   Intravenous Continuous Mihai Croitoru, MD 250 mL/hr at 01/16/12 0900    . 0.9 %  sodium chloride infusion  250 mL Intravenous Continuous Mihai Croitoru, MD 1 mL/hr at 01/16/12 2111 250 mL at 01/16/12 2111  . acetaminophen (TYLENOL) suppository 650 mg  650 mg Rectal Q6H PRN Rometta Emery, MD      . acetaminophen (TYLENOL) tablet 650 mg  650 mg Oral Q4H PRN Thurmon Fair, MD   650 mg at 01/18/12 5409  . albuterol (PROVENTIL) (5 MG/ML) 0.5% nebulizer solution 2.5 mg  2.5 mg Nebulization Q2H PRN Rometta Emery, MD      . ALPRAZolam Prudy Feeler) tablet 0.25-0.5 mg  0.25-0.5 mg Oral Q4H PRN Kerin Perna, MD      . aspirin tablet 325 mg  325 mg Oral Daily Rometta Emery, MD   325 mg at 01/18/12 0941  . carvedilol (COREG) tablet 6.25 mg  6.25 mg Oral BID WC Rometta Emery, MD   6.25 mg at 01/18/12 1645  . Fluticasone-Salmeterol (ADVAIR) 250-50 MCG/DOSE inhaler 1 puff  1 puff Inhalation BID Kerin Perna, MD   1 puff at 01/18/12 2007  . heparin ADULT infusion 100 units/mL (25000 units/250 mL)  1,350 Units/hr Intravenous Continuous Mihai Croitoru, MD 13.5 mL/hr at 01/19/12 0203 1,350 Units/hr at 01/19/12 0203  . morphine 2  MG/ML injection 2 mg  2 mg Intravenous Q4H PRN Rometta Emery, MD   2 mg at 01/19/12 0203  . nicotine (NICODERM CQ - dosed in mg/24 hours) patch 21 mg  21 mg Transdermal Daily Rolan Lipa, NP   21 mg at 01/18/12 0941  . nitroGLYCERIN (NITROSTAT) SL tablet 0.4 mg  0.4 mg Sublingual Q5 Min x 3 PRN Theotis Burrow, MD   0.4 mg at 01/14/12 0038  . nitroGLYCERIN 0.2 mg/mL in dextrose 5 % infusion  2-200 mcg/min Intravenous Titrated Rometta Emery, MD   10 mcg/min at 01/14/12 2000  . ondansetron (ZOFRAN) injection 4 mg  4 mg Intravenous Q6H PRN Mihai Croitoru, MD      . ondansetron (ZOFRAN) tablet 4 mg  4 mg Oral Q6H PRN Rometta Emery, MD      . oxyCODONE-acetaminophen (PERCOCET/ROXICET) 5-325 MG per tablet 1-2 tablet  1-2 tablet Oral Q4H PRN Mihai Croitoru, MD      . polyethylene glycol (MIRALAX / GLYCOLAX) packet 17 g  17 g Oral Daily Kerin Perna, MD   17 g at 01/18/12 2229  . sodium chloride 0.9 % injection 3 mL  3 mL Intravenous Q12H Mihai Croitoru, MD   3 mL at 01/18/12 0941  . sodium chloride 0.9 % injection 3 mL  3 mL Intravenous PRN  Thurmon Fair, MD        Physical Exam: General appearance: alert and no distress Neck: no adenopathy, no carotid bruit, no JVD, supple, symmetrical, trachea midline and thyroid not enlarged, symmetric, no tenderness/mass/nodules Lungs: diminished breath sounds bilaterally Heart: regular rate and rhythm, S1, S2 normal, no murmur, click, rub or gallop Abdomen: soft, non-tender; bowel sounds normal; no masses,  no organomegaly Extremities: extremities normal, atraumatic, no cyanosis or edema Pulses: 1+ PT, DP bilaterally  Lab Results: Results for orders placed during the hospital encounter of 01/13/12 (from the past 48 hour(s))  GLUCOSE, CAPILLARY     Status: Normal   Collection Time   01/17/12  7:55 AM      Component Value Range Comment   Glucose-Capillary 93  70 - 99 mg/dL   GLUCOSE, CAPILLARY     Status: Abnormal   Collection  Time   01/17/12 11:50 AM      Component Value Range Comment   Glucose-Capillary 101 (*) 70 - 99 mg/dL   GLUCOSE, CAPILLARY     Status: Abnormal   Collection Time   01/17/12  3:50 PM      Component Value Range Comment   Glucose-Capillary 125 (*) 70 - 99 mg/dL   GLUCOSE, CAPILLARY     Status: Abnormal   Collection Time   01/17/12  9:20 PM      Component Value Range Comment   Glucose-Capillary 108 (*) 70 - 99 mg/dL    Comment 1 Notify RN      Comment 2 Documented in Chart     CBC     Status: Abnormal   Collection Time   01/18/12  4:57 AM      Component Value Range Comment   WBC 7.5  4.0 - 10.5 K/uL    RBC 4.05 (*) 4.22 - 5.81 MIL/uL    Hemoglobin 13.5  13.0 - 17.0 g/dL    HCT 16.1 (*) 09.6 - 52.0 %    MCV 95.3  78.0 - 100.0 fL    MCH 33.3  26.0 - 34.0 pg    MCHC 35.0  30.0 - 36.0 g/dL    RDW 04.5  40.9 - 81.1 %    Platelets 113 (*) 150 - 400 K/uL CONSISTENT WITH PREVIOUS RESULT  HEPARIN LEVEL (UNFRACTIONATED)     Status: Abnormal   Collection Time   01/18/12  4:57 AM      Component Value Range Comment   Heparin Unfractionated 0.22 (*) 0.30 - 0.70 IU/mL   BASIC METABOLIC PANEL     Status: Abnormal   Collection Time   01/18/12  4:57 AM      Component Value Range Comment   Sodium 143  135 - 145 mEq/L    Potassium 4.0  3.5 - 5.1 mEq/L    Chloride 107  96 - 112 mEq/L    CO2 25  19 - 32 mEq/L    Glucose, Bld 113 (*) 70 - 99 mg/dL    BUN 16  6 - 23 mg/dL    Creatinine, Ser 9.14  0.50 - 1.35 mg/dL    Calcium 8.8  8.4 - 78.2 mg/dL    GFR calc non Af Amer 55 (*) >90 mL/min    GFR calc Af Amer 64 (*) >90 mL/min   GLUCOSE, CAPILLARY     Status: Normal   Collection Time   01/18/12  7:34 AM      Component Value Range Comment   Glucose-Capillary 97  70 - 99 mg/dL  GLUCOSE, CAPILLARY     Status: Abnormal   Collection Time   01/18/12 12:24 PM      Component Value Range Comment   Glucose-Capillary 100 (*) 70 - 99 mg/dL   HEPARIN LEVEL (UNFRACTIONATED)     Status: Normal   Collection  Time   01/18/12  2:40 PM      Component Value Range Comment   Heparin Unfractionated 0.46  0.30 - 0.70 IU/mL   GLUCOSE, CAPILLARY     Status: Abnormal   Collection Time   01/18/12  5:11 PM      Component Value Range Comment   Glucose-Capillary 141 (*) 70 - 99 mg/dL   TYPE AND SCREEN     Status: Normal   Collection Time   01/18/12 10:43 PM      Component Value Range Comment   ABO/RH(D) O POS      Antibody Screen NEG      Sample Expiration 01/21/2012     ABO/RH     Status: Normal   Collection Time   01/18/12 10:43 PM      Component Value Range Comment   ABO/RH(D) O POS     CBC     Status: Abnormal   Collection Time   01/19/12  5:08 AM      Component Value Range Comment   WBC 5.7  4.0 - 10.5 K/uL    RBC 3.98 (*) 4.22 - 5.81 MIL/uL    Hemoglobin 13.3  13.0 - 17.0 g/dL    HCT 16.1 (*) 09.6 - 52.0 %    MCV 94.7  78.0 - 100.0 fL    MCH 33.4  26.0 - 34.0 pg    MCHC 35.3  30.0 - 36.0 g/dL    RDW 04.5  40.9 - 81.1 %    Platelets 105 (*) 150 - 400 K/uL CONSISTENT WITH PREVIOUS RESULT  HEPARIN LEVEL (UNFRACTIONATED)     Status: Normal   Collection Time   01/19/12  5:08 AM      Component Value Range Comment   Heparin Unfractionated 0.54  0.30 - 0.70 IU/mL   BASIC METABOLIC PANEL     Status: Abnormal   Collection Time   01/19/12  5:08 AM      Component Value Range Comment   Sodium 142  135 - 145 mEq/L    Potassium 4.2  3.5 - 5.1 mEq/L    Chloride 107  96 - 112 mEq/L    CO2 25  19 - 32 mEq/L    Glucose, Bld 113 (*) 70 - 99 mg/dL    BUN 12  6 - 23 mg/dL    Creatinine, Ser 9.14  0.50 - 1.35 mg/dL    Calcium 9.1  8.4 - 78.2 mg/dL    GFR calc non Af Amer 63 (*) >90 mL/min    GFR calc Af Amer 73 (*) >90 mL/min     Imaging: Ct Angio Chest W/cm &/or Wo Cm  01/17/2012  *RADIOLOGY REPORT*  Clinical Data: Severe COPD.  Aortic disease.  Possible heart surgery.  CT ANGIOGRAPHY CHEST  Technique:  Multidetector CT imaging of the chest using the standard protocol during bolus administration of  intravenous contrast. Multiplanar reconstructed images including MIPs were obtained and reviewed to evaluate the vascular anatomy.  Contrast: OMNIPAQUE IOHEXOL 350 MG/ML SOLN  Comparison: No priors.  Findings:  Mediastinum: Heart size is mildly enlarged with probable mild left ventricular dilatation.  Notably, the apex of the left ventricle appears mildly thinned and  slightly rounded in appearance, and there is a 2 cm filling defect in the apex, favored to represent left ventricular apical thrombus. There is atherosclerosis of the thoracic aorta, the great vessels of the mediastinum and the coronary arteries, including calcified atherosclerotic plaque in the left main, left anterior descending, left circumflex and right coronary arteries. Thickening and calcification of the aortic valve. There is no significant pericardial fluid, thickening or pericardial calcification. No pathologically enlarged mediastinal or hilar lymph nodes.   .  No acute abnormality of the thoracic aorta; specifically, no aneurysm or dissection.  Extensive atherosclerotic plaque is also noted within the great vessels of the mediastinum, and notably, there appears to be a high-grade stenosis at the ostium of the left vertebral artery (likely greater than 70% stenosis).  Importantly, the right vertebral artery is very diminutive and the left vertebral artery appears to be dominant.  Lungs/Pleura: There is a background of very mild patchy ground- glass attenuation and interlobular septal thickening in the lungs bilaterally, favored to represent mild interstitial pulmonary edema.  Associated with this there is some mild diffuse thickening of the peribronchovascular interstitium.  Trace bilateral pleural effusions (right greater than left).  There are a few scattered calcified pulmonary nodules compatible with calcified granulomas. No other larger suspicious appearing pulmonary nodules or masses are otherwise identified.  Upper Abdomen:  Subcentimeter low-attenuation lesion in the upper pole of the right kidney. The large 1.3 x 2.8 cm stone in the left renal collecting system.  The left renal collecting system demonstrates some mild urothelial enhancement, likely chronic related to the indwelling stone.  Numerous tiny calcifications throughout the liver likely represent small calcified granulomas. Extensive atherosclerosis of the visualized abdominal vasculature.  Musculoskeletal: There are no aggressive appearing lytic or blastic lesions noted in the visualized portions of the skeleton.  IMPRESSION: 1.  Left ventricular dilatation with a thinned out and slightly rounded appearance of the apical wall segments (suggesting ischemia and/or scarring from prior myocardial infarction).  Importantly, there is a 2 cm filling defect in the apex of the left ventricle, most likely to represent thrombus. 2.  In addition, there is extensive atherosclerosis of the aorta and arch vessels, including a high-grade (likely greater than 70% stenosis) lesion at the ostium of the left vertebral artery. Notably, the patient is left vertebral artery dominant with a very diminutive right vertebral artery. 3. Atherosclerosis, including left main and three-vessel coronary artery disease. 4.  The appearance of the lungs suggests mild interstitial pulmonary edema.  In addition, there are trace bilateral pleural effusions layering dependently. 5.  Large 1.3 x 2.8 cm calculus in the left renal collecting system. 6.  Additional incidental findings, as above.  These results were called by telephone on 01/17/2012 at 02:22 p.m. to Dr. Donata Clay, who verbally acknowledged these results.  Original Report Authenticated By: Florencia Reasons, M.D.    Assessment:  1. Principal Problem: 2.  *NSTEMI (non-ST elevated myocardial infarction) 3. Active Problems: 4.  H/O ascending aorta repair 5.  HTN (hypertension) 6.  Thrombocytopenia 7.  Tobacco abuse 8.  Staghorn renal  calculus 9.  Left ventricular apical thrombus following MI, NSTEMI, 01/13/12 10.  DM (diabetes mellitus) 11.  CAD (coronary artery disease), 3-V at cath 01/16/12 12.  COPD (chronic obstructive pulmonary disease) 13.  Acute renal insufficiency, SCr up to 1.43 14.  Ischemic cardiomyopathy, EF 40-45% 2D 01/15/12 15.  LV (left ventricular) mural thrombus following MI 16.  Carotid arterial disease 17.  History of stroke 8.  Plan:  1. Continues to have angina which is amenable to morphine. Neither CABG or PCI are ideal options, especially given low EF, bilateral carotid disease, ongoing hematuria on heparin with a staghorn calculus, and COPD. Dr. Allyson Sabal is to discuss with Dr. Donata Clay risk/benefit of PCI vs. CABG. Plan for CTA of the neck today. Continue heparin for LV apical thrombus.  Time Spent Directly with Patient:  15 minutes  Length of Stay:  LOS: 6 days   Chrystie Nose, MD, [facility] Attending Cardiologist [facility]  HILTY,Kenneth C 01/19/2012, 7:46 AM

## 2012-01-19 NOTE — Progress Notes (Signed)
1200  Have read progress notes. Please address activity if pt to walk prior to intervention. Noted pt having chest pain relieved with morphine. We will continue to follow. Stashia Sia DunlapRN

## 2012-01-19 NOTE — Progress Notes (Signed)
VASCULAR PROGRESS NOTE  SUBJECTIVE: no new complaints. Currently no chest pain.  PHYSICAL EXAM: Filed Vitals:   01/19/12 0345 01/19/12 0400 01/19/12 0730 01/19/12 1200  BP: 96/63  127/93 95/66  Pulse:   65 60  Temp:  98 F (36.7 C) 98.2 F (36.8 C) 97.8 F (36.6 C)  TempSrc:  Oral Oral Oral  Resp: 22  23 21   Height:      Weight:      SpO2:  97% 95% 96%   Good strength in upper and lower extremities bilaterally. Patient with expressive aphasia which is chronic  LABS: Lab Results  Component Value Date   WBC 5.7 01/19/2012   HGB 13.3 01/19/2012   HCT 37.7* 01/19/2012   MCV 94.7 01/19/2012   PLT 105* 01/19/2012   Lab Results  Component Value Date   CREATININE 1.14 01/19/2012   Lab Results  Component Value Date   INR 1.15 01/16/2012   CBG (last 3)   Basename 01/19/12 1223 01/19/12 0736 01/18/12 1711  GLUCAP 89 86 141*   I have reviewed his CT angiogram. The stenosis on the left is fairly focal and does appear to be surgically accessible.   ASSESSMENT/PLAN: Patient is scheduled for left carotid endarterectomy and CABG on Monday. Dr. Arbie Cookey my partner will perform the carotid endarterectomy. I have discussed this with the patient. I have reviewed the indications for carotid endarterectomy, that is to lower the risk of future stroke. I have also reviewed the potential complications of surgery, including but not limited to: bleeding, stroke (perioperative risk 1-2%), MI, nerve injury of other unpredictable medical problems. All of the patients questions were answered and they are agreeable to proceed with surgery.   Waverly Ferrari, MD, FACS Beeper: [phone] 01/19/2012

## 2012-01-19 NOTE — Progress Notes (Addendum)
ANTICOAGULATION CONSULT NOTE - Follow Up Consult  Pharmacy Consult for Heparin Indication: CAD, LV thrombus, awaiting CABG 7/29  No Known Allergies  Patient Measurements: Height: 5\' 6"  (167.6 cm) Weight: 143 lb 15.4 oz (65.3 kg) IBW/kg (Calculated) : 63.8  Heparin Dosing Weight: 65.3 kg  Vital Signs: Temp: 97.8 F (36.6 C) (07/26 1200) Temp src: Oral (07/26 1200) BP: 95/66 mmHg (07/26 1200) Pulse Rate: 60  (07/26 1200)  Labs:  Basename 01/19/12 0508 01/18/12 1440 01/18/12 0457 01/17/12 0535  HGB 13.3 -- 13.5 --  HCT 37.7* -- 38.6* 37.1*  PLT 105* -- 113* 99*  APTT -- -- -- --  LABPROT -- -- -- --  INR -- -- -- --  HEPARINUNFRC 0.54 0.46 0.22* --  CREATININE 1.14 -- 1.28 --  CKTOTAL -- -- -- --  CKMB -- -- -- --  TROPONINI -- -- -- --    Estimated Creatinine Clearance: 54.4 ml/min (by C-G formula based on Cr of 1.14).   Medications:  Infusions:     . sodium chloride 250 mL/hr at 01/16/12 0900  . sodium chloride 250 mL (01/16/12 2111)  . heparin 1,350 Units/hr (01/19/12 0203)  . nitroGLYCERIN Stopped (01/16/12 0935)    Assessment: Mr. Penza has a large inferioapical LV thrombus on echo and significant 3V CAD per cath lab report on 7/23. Heparin was resumed post cath. He is being evaluated for possible CABG 7/29.  Heparin level currently therapeutic on 1350 units/hr. Per MD notes, hematuria reported while on heparin due to large renal calculus.  CBC fairly stable.  Goal of Therapy:  Heparin level 0.3-0.7 units/ml Monitor platelets by anticoagulation protocol: Yes   Plan:  1. Continue IV heparin at current rate. 2. Continue daily heparin level and CBC. 3. Continue to monitor hematuria.  Dewayne Severe C 01/19/2012,2:30 PM

## 2012-01-20 DIAGNOSIS — Z01810 Encounter for preprocedural cardiovascular examination: Secondary | ICD-10-CM

## 2012-01-20 LAB — CBC
HCT: 40.7 % (ref 39.0–52.0)
MCV: 95.8 fL (ref 78.0–100.0)
Platelets: 111 10*3/uL — ABNORMAL LOW (ref 150–400)
RBC: 4.25 MIL/uL (ref 4.22–5.81)
RDW: 14 % (ref 11.5–15.5)
WBC: 5.6 10*3/uL (ref 4.0–10.5)

## 2012-01-20 LAB — BASIC METABOLIC PANEL
CO2: 26 mEq/L (ref 19–32)
Chloride: 105 mEq/L (ref 96–112)
Creatinine, Ser: 1.17 mg/dL (ref 0.50–1.35)
Sodium: 141 mEq/L (ref 135–145)

## 2012-01-20 LAB — HEMOGLOBIN A1C
Hgb A1c MFr Bld: 5.3 % (ref <5.7)
Mean Plasma Glucose: 105 mg/dL (ref <117)

## 2012-01-20 LAB — GLUCOSE, CAPILLARY: Glucose-Capillary: 104 mg/dL — ABNORMAL HIGH (ref 70–99)

## 2012-01-20 MED ORDER — ATORVASTATIN CALCIUM 20 MG PO TABS
20.0000 mg | ORAL_TABLET | Freq: Every day | ORAL | Status: DC
  Administered 2012-01-20 – 2012-01-29 (×9): 20 mg via ORAL
  Filled 2012-01-20 (×10): qty 1

## 2012-01-20 NOTE — Progress Notes (Addendum)
Subjective:  No chest pain, c/o mild headache   Objective:  Vital Signs in the last 24 hours: Temp:  [97.8 F (36.6 C)-98.3 F (36.8 C)] 98 F (36.7 C) (07/27 0737) Pulse Rate:  [60-66] 66  (07/26 1600) Resp:  [18-28] 25  (07/27 0737) BP: (94-117)/(55-74) 117/74 mmHg (07/27 0737) SpO2:  [90 %-98 %] 98 % (07/27 0737)  Intake/Output from previous day:  Intake/Output Summary (Last 24 hours) at 01/20/12 0824 Last data filed at 01/20/12 0700  Gross per 24 hour  Intake   1059 ml  Output   2525 ml  Net  -1466 ml    Physical Exam: General appearance: alert, cooperative and no distress Neck: Lt CA bruit Lungs: few crackles Lt base Heart: regular rate and rhythm Abd; soft BS+ No edema   Rate: 68  Rhythm: normal sinus rhythm  Lab Results:  Basename 01/20/12 0511 01/19/12 0508  WBC 5.6 5.7  HGB 14.1 13.3  PLT 111* 105*    Basename 01/20/12 0511 01/19/12 0508  NA 141 142  K 4.6 4.2  CL 105 107  CO2 26 25  GLUCOSE 111* 113*  BUN 18 12  CREATININE 1.17 1.14   No results found for this basename: TROPONINI:2,CK,MB:2 in the last 72 hours Hepatic Function Panel No results found for this basename: PROT,ALBUMIN,AST,ALT,ALKPHOS,BILITOT,BILIDIR,IBILI in the last 72 hours No results found for this basename: CHOL in the last 72 hours No results found for this basename: INR in the last 72 hours  Imaging: Ct Angio Neck W/cm &/or Wo/cm  01/19/2012  *RADIOLOGY REPORT*  Clinical Data:  Left carotid stenosis  CT ANGIOGRAPHY NECK  Technique:  Multidetector CT imaging of the neck was performed using the standard protocol during bolus administration of intravenous contrast.  Multiplanar CT image reconstructions including MIPs were obtained to evaluate the vascular anatomy. Carotid stenosis measurements (when applicable) are obtained utilizing NASCET criteria, using the distal internal carotid diameter as the denominator.  Contrast: 50mL OMNIPAQUE IOHEXOL 350 MG/ML SOLN  Comparison:  CT  chest 01/17/2012  Findings:  Mild patchy densities in the upper lobes bilaterally have progressed and may be due to infection or edema in the lungs. Small pleural effusions bilaterally.  Moderate to advanced cervical spondylosis.  Large posterior osteophyte at C3-4 causing spinal stenosis.  Large anterior osteophytes are present throughout the cervical spine.  No fracture or acute bony abnormality.  No mass or adenopathy is present in the neck.  Severe diffuse atherosclerotic disease with atherosclerotic plaque and calcification of the aortic arch and cervical vessels.  Left carotid artery   origin from the innominate artery.  There is extensive atherosclerotic disease in the left subclavian artery with approximately 60% diameter stenosis.  Right carotid:  Calcified atherosclerotic plaque in the proximal common carotid artery and noncalcified plaque in the distal common carotid artery.  Heavily calcified plaque at the carotid bifurcation.  The lumen is narrowed to 1 mm diameter stenosis, best seen on the sagittal images.  This corresponds to 83% diameter stenosis.  60% diameter stenosis of the right external carotid artery at the origin.  Negative for carotid dissection.  The remainder the internal carotid artery is patent to the skull base.  Left carotid:  Left common carotid artery has mild plaque in the midportion without significant stenosis.  Heavily calcified plaque of the right carotid bifurcation extending into the internal carotid artery.  This narrows the lumen to 1.0 mm in diameter. However, this appears  to be a higher grade stenosis than  the right carotid.  There is approximately 83% diameter stenosis of the left internal carotid artery.  There is severe stenosis at the origin of the left external carotid artery.  Vertebral arteries:  Right vertebral artery is a small vessel and is diffusely diseased.  There is a moderate stenosis proximally, and diffuse disease throughout the vertebral artery which  occludes at approximately the C1 level.  There is a 60% stenosis at the origin of the left vertebral artery.  Left vertebral artery has diffuse atherosclerotic disease.   There is a calcified plaque at the foramen magnum causing moderate stenosis.   Review of the MIP images confirms the above findings.  IMPRESSION: Severe diffuse atherosclerotic disease, including the aortic arch and the left subclavian artery.  Approximately 83% diameter stenosis of the proximal right internal carotid artery.  Approximately 83% diameter stenosis of the proximal left internal carotid artery.  Severe stenosis of the left external carotid artery.  Small right vertebral artery which is diffusely diseased and occludes at the C1 level.  There is also atherosclerotic disease in the left vertebral artery with multiple areas of stenosis.  Mild patchy airspace disease bilaterally may represent pneumonia or edema, this shows mild progression from the CT of 01/17/2012  Original Report Authenticated By: Camelia Phenes, M.D.    Cardiac Studies:  Assessment/Plan:   Principal Problem:  *NSTEMI (non-ST elevated myocardial infarction) Active Problems:  CAD (coronary artery disease), 3-V at cath 01/16/12  H/O ascending aorta repair  Thrombocytopenia  Left ventricular apical thrombus following MI, NSTEMI, 01/13/12  DM (diabetes mellitus)  COPD (chronic obstructive pulmonary disease)  Acute renal insufficiency, SCr up to 1.43  Ischemic cardiomyopathy, EF 40-45% 2D 01/15/12  LV (left ventricular) mural thrombus following MI  Carotid arterial disease, severe LICA stenosis  HTN (hypertension)  Tobacco abuse  Staghorn renal calculus  History of stroke   Plan- combined CABG and CEA Monday. Add statin   Corine Shelter PA-C 01/20/2012, 8:24 AM   Patient seen and examined. Agree with assessment and plan. No further anginal symptoms on heparin. For CABG/L CEA on Monday.  Will add statin.   Lennette Bihari, MD, [facility] 01/20/2012 8:31  AM

## 2012-01-20 NOTE — Progress Notes (Signed)
ANTICOAGULATION CONSULT NOTE - Follow Up Consult  Pharmacy Consult for Heparin Indication: CAD, LV thrombus, awaiting CABG 7/29  No Known Allergies  Patient Measurements: Height: 5\' 6"  (167.6 cm) Weight: 143 lb 15.4 oz (65.3 kg) IBW/kg (Calculated) : 63.8  Heparin Dosing Weight: 65.3 kg  Vital Signs: Temp: 98 F (36.7 C) (07/27 0737) Temp src: Oral (07/27 0737) BP: 117/74 mmHg (07/27 0737)  Labs:  Basename 01/20/12 0511 01/19/12 0508 01/18/12 1440 01/18/12 0457  HGB 14.1 13.3 -- --  HCT 40.7 37.7* -- 38.6*  PLT 111* 105* -- 113*  APTT -- -- -- --  LABPROT -- -- -- --  INR -- -- -- --  HEPARINUNFRC 0.35 0.54 0.46 --  CREATININE 1.17 1.14 -- 1.28  CKTOTAL -- -- -- --  CKMB -- -- -- --  TROPONINI -- -- -- --    Estimated Creatinine Clearance: 53 ml/min (by C-G formula based on Cr of 1.17).   Medications:  Infusions:     . sodium chloride 250 mL/hr at 01/16/12 0900  . sodium chloride 250 mL (01/19/12 1900)  . heparin 1,350 Units/hr (01/19/12 2156)  . nitroGLYCERIN Stopped (01/16/12 0935)    Assessment: Charles Hatfield has a large inferioapical LV thrombus on echo and significant 3V CAD per cath lab report on 7/23. Heparin was resumed post cath. He is being evaluated for possible CABG 7/29.  Heparin level currently therapeutic on 1350 units/hr. Per MD notes, hematuria reported while on heparin due to large renal calculus.  Per RN this is not any better today.  CBC fairly stable.  Goal of Therapy:  Heparin level 0.3-0.7 units/ml Monitor platelets by anticoagulation protocol: Yes   Plan:  1. Continue IV heparin at current rate. 2. Continue daily heparin level and CBC. 3. Continue to monitor hematuria.  Gardner Candle 01/20/2012,11:08 AM

## 2012-01-21 LAB — CBC
Platelets: 103 10*3/uL — ABNORMAL LOW (ref 150–400)
RBC: 3.92 MIL/uL — ABNORMAL LOW (ref 4.22–5.81)
WBC: 6.1 10*3/uL (ref 4.0–10.5)

## 2012-01-21 LAB — GLUCOSE, CAPILLARY: Glucose-Capillary: 93 mg/dL (ref 70–99)

## 2012-01-21 LAB — HEPARIN LEVEL (UNFRACTIONATED): Heparin Unfractionated: 0.18 IU/mL — ABNORMAL LOW (ref 0.30–0.70)

## 2012-01-21 LAB — PREPARE RBC (CROSSMATCH)

## 2012-01-21 LAB — BASIC METABOLIC PANEL
Chloride: 107 mEq/L (ref 96–112)
GFR calc Af Amer: 59 mL/min — ABNORMAL LOW (ref >90)
Potassium: 4.7 mEq/L (ref 3.5–5.1)
Sodium: 143 mEq/L (ref 135–145)

## 2012-01-21 MED ORDER — CHLORHEXIDINE GLUCONATE 4 % EX LIQD
CUTANEOUS | Status: AC
  Filled 2012-01-21: qty 60

## 2012-01-21 MED ORDER — DEXMEDETOMIDINE HCL IN NACL 400 MCG/100ML IV SOLN
0.1000 ug/kg/h | INTRAVENOUS | Status: AC
  Administered 2012-01-22: .2 ug/kg/h via INTRAVENOUS
  Filled 2012-01-21 (×2): qty 100

## 2012-01-21 MED ORDER — BISACODYL 5 MG PO TBEC
5.0000 mg | DELAYED_RELEASE_TABLET | Freq: Once | ORAL | Status: AC
  Administered 2012-01-21: 5 mg via ORAL
  Filled 2012-01-21: qty 1

## 2012-01-21 MED ORDER — SODIUM CHLORIDE 0.9 % IV SOLN
1250.0000 mg | INTRAVENOUS | Status: AC
  Administered 2012-01-22: 1250 mg via INTRAVENOUS
  Filled 2012-01-21: qty 1250

## 2012-01-21 MED ORDER — DOPAMINE-DEXTROSE 3.2-5 MG/ML-% IV SOLN
2.0000 ug/kg/min | INTRAVENOUS | Status: AC
  Administered 2012-01-22: 2.5 ug/kg/min via INTRAVENOUS
  Filled 2012-01-21: qty 250

## 2012-01-21 MED ORDER — NITROGLYCERIN IN D5W 200-5 MCG/ML-% IV SOLN
2.0000 ug/min | INTRAVENOUS | Status: DC
  Filled 2012-01-21: qty 250

## 2012-01-21 MED ORDER — DEXTROSE 5 % IV SOLN
750.0000 mg | INTRAVENOUS | Status: DC
  Filled 2012-01-21 (×2): qty 750

## 2012-01-21 MED ORDER — CHLORHEXIDINE GLUCONATE 4 % EX LIQD
60.0000 mL | Freq: Once | CUTANEOUS | Status: AC
  Administered 2012-01-21: 22:00:00 via TOPICAL

## 2012-01-21 MED ORDER — MAGNESIUM HYDROXIDE 400 MG/5ML PO SUSP: 15.0000 mL | Freq: Every day | ORAL | Status: DC | PRN

## 2012-01-21 MED ORDER — TRANEXAMIC ACID 100 MG/ML IV SOLN
1.5000 mg/kg/h | INTRAVENOUS | Status: DC
  Filled 2012-01-21: qty 25

## 2012-01-21 MED ORDER — EPINEPHRINE HCL 1 MG/ML IJ SOLN
0.5000 ug/min | INTRAVENOUS | Status: DC
  Filled 2012-01-21: qty 4

## 2012-01-21 MED ORDER — SODIUM CHLORIDE 0.9 % IV SOLN
INTRAVENOUS | Status: AC
  Administered 2012-01-22: 1 [IU]/h via INTRAVENOUS
  Filled 2012-01-21: qty 1

## 2012-01-21 MED ORDER — POTASSIUM CHLORIDE 2 MEQ/ML IV SOLN
80.0000 meq | INTRAVENOUS | Status: DC
  Filled 2012-01-21: qty 40

## 2012-01-21 MED ORDER — TEMAZEPAM 15 MG PO CAPS: 15.0000 mg | ORAL_CAPSULE | Freq: Once | ORAL | Status: AC | PRN

## 2012-01-21 MED ORDER — DEXTROSE 5 % IV SOLN
1.5000 g | INTRAVENOUS | Status: AC
  Administered 2012-01-22: .75 g via INTRAVENOUS
  Administered 2012-01-22: 1.5 g via INTRAVENOUS
  Filled 2012-01-21: qty 1.5

## 2012-01-21 MED ORDER — MAGNESIUM SULFATE 50 % IJ SOLN
40.0000 meq | INTRAMUSCULAR | Status: DC
  Filled 2012-01-21: qty 10

## 2012-01-21 MED ORDER — TRANEXAMIC ACID (OHS) BOLUS VIA INFUSION
15.0000 mg/kg | INTRAVENOUS | Status: AC
  Administered 2012-01-22: 979.5 mg via INTRAVENOUS
  Filled 2012-01-21: qty 980

## 2012-01-21 MED ORDER — DEXTROSE 5 % IV SOLN
30.0000 ug/min | INTRAVENOUS | Status: AC
  Administered 2012-01-22: 25 ug/min via INTRAVENOUS
  Filled 2012-01-21: qty 2

## 2012-01-21 MED ORDER — TRANEXAMIC ACID (OHS) PUMP PRIME SOLUTION
2.0000 mg/kg | INTRAVENOUS | Status: DC
  Filled 2012-01-21: qty 1.31

## 2012-01-21 MED ORDER — METOPROLOL TARTRATE 12.5 MG HALF TABLET
12.5000 mg | ORAL_TABLET | Freq: Once | ORAL | Status: AC
  Administered 2012-01-22: 12.5 mg via ORAL
  Filled 2012-01-21: qty 1

## 2012-01-21 MED ORDER — SODIUM BICARBONATE 8.4 % IV SOLN
INTRAVENOUS | Status: AC
  Administered 2012-01-22: 10:00:00
  Filled 2012-01-21 (×2): qty 2.5

## 2012-01-21 NOTE — Progress Notes (Signed)
ANTICOAGULATION CONSULT NOTE - Follow Up Consult  Pharmacy Consult for Heparin Indication: CAD, LV thrombus, awaiting CABG 7/29  No Known Allergies  Patient Measurements: Height: 5\' 6"  (167.6 cm) Weight: 143 lb 15.4 oz (65.3 kg) IBW/kg (Calculated) : 63.8  Heparin Dosing Weight: 65.3 kg  Vital Signs: Temp: 97.6 F (36.4 C) (07/28 0315) Temp src: Oral (07/28 0315) BP: 92/48 mmHg (07/28 0316) Pulse Rate: 58  (07/27 1936)  Labs:  Basename 01/21/12 0540 01/20/12 0511 01/19/12 0508  HGB 13.0 14.1 --  HCT 37.8* 40.7 37.7*  PLT 103* 111* 105*  APTT -- -- --  LABPROT -- -- --  INR -- -- --  HEPARINUNFRC 0.18* 0.35 0.54  CREATININE -- 1.17 1.14  CKTOTAL -- -- --  CKMB -- -- --  TROPONINI -- -- --    Estimated Creatinine Clearance: 53 ml/min (by C-G formula based on Cr of 1.17).  Assessment: 71 yo male with LV thrombus, CAD, awaiting CABG, for Heparin  Goal of Therapy:  Heparin level 0.3-0.7 units/ml Monitor platelets by anticoagulation protocol: Yes   Plan:  Increase Heparin 1500 units/hr Check heparin level in 8 hours.  [name] 01/21/2012,6:15 AM

## 2012-01-21 NOTE — Progress Notes (Signed)
Patient ID: Charles Hatfield, male   DOB: 01-08-1941, 71 y.o.   MRN: [id number] Patient remained stable from a cardiac and neurologic standpoint. I had a long discussion with the patient and his son who is present today as well. I did explain that CT scan showed greater than 80% internal carotid artery stenosis bilaterally. He is right-handed. He is scheduled for left carotid endarterectomy and coronary bypass grafting is combined procedure tomorrow. I discussed potential risk of stroke and cranial nerve injury with the patient who understands. Regarding his asymptomatic severe right carotid stenosis I explained that we would address this after complete recovery from his surgery for tomorrow.

## 2012-01-21 NOTE — Progress Notes (Addendum)
ANTICOAGULATION CONSULT NOTE - Follow Up Consult  Pharmacy Consult for Heparin Indication: CAD, LV thrombus, awaiting CABG 7/29  No Known Allergies  Patient Measurements: Height: 5\' 6"  (167.6 cm) Weight: 143 lb 15.4 oz (65.3 kg) IBW/kg (Calculated) : 63.8  Heparin Dosing Weight: 65.3 kg  Vital Signs: Temp: 97.7 F (36.5 C) (07/28 1134) Temp src: Oral (07/28 1134) BP: 106/71 mmHg (07/28 0747) Pulse Rate: 60  (07/28 0736)  Labs:  Basename 01/21/12 1342 01/21/12 0540 01/20/12 0511 01/19/12 0508  HGB -- 13.0 14.1 --  HCT -- 37.8* 40.7 37.7*  PLT -- 103* 111* 105*  APTT -- -- -- --  LABPROT -- -- -- --  INR -- -- -- --  HEPARINUNFRC 0.30 0.18* 0.35 --  CREATININE -- 1.36* 1.17 1.14  CKTOTAL -- -- -- --  CKMB -- -- -- --  TROPONINI -- -- -- --    Estimated Creatinine Clearance: 45.6 ml/min (by C-G formula based on Cr of 1.36).   Medications:  Infusions:     . sodium chloride 0 mL/hr at 01/20/12 1900  . sodium chloride 250 mL (01/21/12 1300)  . heparin 1,500 Units/hr (01/21/12 1300)  . nitroGLYCERIN Stopped (01/16/12 0935)    Assessment: Charles Hatfield has a large inferioapical LV thrombus on echo and significant 3V CAD per cath lab report on 7/23. Heparin was resumed post cath. He is being evaluated for possible CABG 7/29.  Heparin level currently therapeutic on 1500 units/hr.   Per MD notes, hematuria reported while on heparin due to large renal calculus.  Per RN this is not any better today, maybe a little worse.  CBC fairly stable.  Heparin level at lower end of therapeutic range.  I'm not inclined to adjust given hematuria.  Goal of Therapy:  Heparin level 0.3-0.7 units/ml Monitor platelets by anticoagulation protocol: Yes   Plan:  1. Continue IV heparin at current rate. 2. Continue daily heparin level and CBC. 3. Continue to monitor hematuria. 4. F/U post-OR tomorrow.  Astou Lada C 01/21/2012,2:39 PM

## 2012-01-21 NOTE — Progress Notes (Signed)
Subjective:  No chest pain  Objective:  Vital Signs in the last 24 hours: Temp:  [97.4 F (36.3 C)-97.9 F (36.6 C)] 97.6 F (36.4 C) (07/28 0315) Pulse Rate:  [58-60] 60  (07/28 0736) Resp:  [18-26] 18  (07/28 0736) BP: (85-106)/(48-63) 92/48 mmHg (07/28 0316) SpO2:  [95 %-99 %] 96 % (07/28 0736)  Intake/Output from previous day:  Intake/Output Summary (Last 24 hours) at 01/21/12 0747 Last data filed at 01/21/12 0600  Gross per 24 hour  Intake 1260.5 ml  Output   2500 ml  Net -1239.5 ml    Physical Exam: General appearance: alert, cooperative and no distress Lungs: clear to auscultation bilaterally Heart: regular rate and rhythm, S1, S2 normal, no murmur, click, rub or gallop   Rate: 60  Rhythm: normal sinus rhythm  Lab Results:  Basename 01/21/12 0540 01/20/12 0511  WBC 6.1 5.6  HGB 13.0 14.1  PLT 103* 111*    Basename 01/21/12 0540 01/20/12 0511  NA 143 141  K 4.7 4.6  CL 107 105  CO2 26 26  GLUCOSE 116* 111*  BUN 17 18  CREATININE 1.36* 1.17   No results found for this basename: TROPONINI:2,CK,MB:2 in the last 72 hours Hepatic Function Panel No results found for this basename: PROT,ALBUMIN,AST,ALT,ALKPHOS,BILITOT,BILIDIR,IBILI in the last 72 hours No results found for this basename: CHOL in the last 72 hours No results found for this basename: INR in the last 72 hours  Imaging: Ct Angio Neck W/cm &/or Wo/cm  01/19/2012  *RADIOLOGY REPORT*  Clinical Data:  Left carotid stenosis  CT ANGIOGRAPHY NECK  Technique:  Multidetector CT imaging of the neck was performed using the standard protocol during bolus administration of intravenous contrast.  Multiplanar CT image reconstructions including MIPs were obtained to evaluate the vascular anatomy. Carotid stenosis measurements (when applicable) are obtained utilizing NASCET criteria, using the distal internal carotid diameter as the denominator.  Contrast: 50mL OMNIPAQUE IOHEXOL 350 MG/ML SOLN  Comparison:  CT  chest 01/17/2012  Findings:  Mild patchy densities in the upper lobes bilaterally have progressed and may be due to infection or edema in the lungs. Small pleural effusions bilaterally.  Moderate to advanced cervical spondylosis.  Large posterior osteophyte at C3-4 causing spinal stenosis.  Large anterior osteophytes are present throughout the cervical spine.  No fracture or acute bony abnormality.  No mass or adenopathy is present in the neck.  Severe diffuse atherosclerotic disease with atherosclerotic plaque and calcification of the aortic arch and cervical vessels.  Left carotid artery   origin from the innominate artery.  There is extensive atherosclerotic disease in the left subclavian artery with approximately 60% diameter stenosis.  Right carotid:  Calcified atherosclerotic plaque in the proximal common carotid artery and noncalcified plaque in the distal common carotid artery.  Heavily calcified plaque at the carotid bifurcation.  The lumen is narrowed to 1 mm diameter stenosis, best seen on the sagittal images.  This corresponds to 83% diameter stenosis.  60% diameter stenosis of the right external carotid artery at the origin.  Negative for carotid dissection.  The remainder the internal carotid artery is patent to the skull base.  Left carotid:  Left common carotid artery has mild plaque in the midportion without significant stenosis.  Heavily calcified plaque of the right carotid bifurcation extending into the internal carotid artery.  This narrows the lumen to 1.0 mm in diameter. However, this appears  to be a higher grade stenosis than the right carotid.  There is approximately  83% diameter stenosis of the left internal carotid artery.  There is severe stenosis at the origin of the left external carotid artery.  Vertebral arteries:  Right vertebral artery is a small vessel and is diffusely diseased.  There is a moderate stenosis proximally, and diffuse disease throughout the vertebral artery which  occludes at approximately the C1 level.  There is a 60% stenosis at the origin of the left vertebral artery.  Left vertebral artery has diffuse atherosclerotic disease.   There is a calcified plaque at the foramen magnum causing moderate stenosis.   Review of the MIP images confirms the above findings.  IMPRESSION: Severe diffuse atherosclerotic disease, including the aortic arch and the left subclavian artery.  Approximately 83% diameter stenosis of the proximal right internal carotid artery.  Approximately 83% diameter stenosis of the proximal left internal carotid artery.  Severe stenosis of the left external carotid artery.  Small right vertebral artery which is diffusely diseased and occludes at the C1 level.  There is also atherosclerotic disease in the left vertebral artery with multiple areas of stenosis.  Mild patchy airspace disease bilaterally may represent pneumonia or edema, this shows mild progression from the CT of 01/17/2012  Original Report Authenticated By: Camelia Phenes, M.D.    Cardiac Studies:  Assessment/Plan:   Principal Problem:  *NSTEMI (non-ST elevated myocardial infarction) Active Problems:  CAD (coronary artery disease), 3-V at cath 01/16/12  H/O ascending aorta repair  Thrombocytopenia  Left ventricular apical thrombus following MI, NSTEMI, 01/13/12  DM (diabetes mellitus)  COPD (chronic obstructive pulmonary disease)  Acute renal insufficiency, SCr up to 1.43  Ischemic cardiomyopathy, EF 40-45% 2D 01/15/12  LV (left ventricular) mural thrombus following MI  Carotid arterial disease, severe LICA stenosis  HTN (hypertension)  Tobacco abuse  Staghorn renal calculus  History of stroke   Plan- PM Coreg held secondary to low B/P. Pt is for combined CEA and CABG in am.   Corine Shelter PA-C 01/21/2012, 7:47 AM    .Agree with note written by Corine Shelter PAC  No CP. On IV hep. For CABG/CEA. Pt also has an LV apical mural thrombus. Exam benign. Labs  OK.  Runell Gess 01/21/2012 8:07 AM

## 2012-01-21 NOTE — Progress Notes (Signed)
Chaplain Note:  Chaplain visited with pt.  Pt was awake and sitting up in bed.  In conversation and prayer, chaplain assisted pt in processing spiritual anxiety over upcoming surgery and other spiritual care issues.  Pt expressed appreciation for chaplain support.  Chaplain will refer this pt to the chaplain normally assigned to CICU.  01/21/12 1800  Clinical Encounter Type  Visited With Patient  Visit Type Spiritual support;Pre-op  Referral From Patient  Consult/Referral To Chaplain  Spiritual Encounters  Spiritual Needs Emotional;Prayer  Stress Factors  Patient Stress Factors Health changes;Major life changes  Family Stress Factors Major life changes   Verdie Shire, chaplain resident [phone]

## 2012-01-22 ENCOUNTER — Encounter (HOSPITAL_COMMUNITY)
Admission: EM | Disposition: A | Discharge status: Skilled Nursing Facility | Payer: Self-pay | Source: Home / Self Care | Attending: Cardiovascular Disease

## 2012-01-22 ENCOUNTER — Encounter (HOSPITAL_COMMUNITY): Payer: Self-pay | Admitting: Certified Registered Nurse Anesthetist

## 2012-01-22 ENCOUNTER — Inpatient Hospital Stay (HOSPITAL_COMMUNITY): Payer: Medicare Other | Admitting: Certified Registered Nurse Anesthetist

## 2012-01-22 ENCOUNTER — Inpatient Hospital Stay (HOSPITAL_COMMUNITY): Payer: Medicare Other

## 2012-01-22 DIAGNOSIS — Z951 Presence of aortocoronary bypass graft: Secondary | ICD-10-CM

## 2012-01-22 DIAGNOSIS — I2510 Atherosclerotic heart disease of native coronary artery without angina pectoris: Secondary | ICD-10-CM

## 2012-01-22 HISTORY — PX: CORONARY ARTERY BYPASS GRAFT: SHX141

## 2012-01-22 HISTORY — DX: Presence of aortocoronary bypass graft: Z95.1

## 2012-01-22 HISTORY — PX: ENDARTERECTOMY: SHX5162

## 2012-01-22 LAB — POCT I-STAT 3, ART BLOOD GAS (G3+)
Acid-base deficit: 3 mmol/L — ABNORMAL HIGH (ref 0.0–2.0)
Acid-base deficit: 1 mmol/L (ref 0.0–2.0)
Acid-base deficit: 5 mmol/L — ABNORMAL HIGH (ref 0.0–2.0)
Acid-base deficit: 2 mmol/L (ref 0.0–2.0)
Bicarbonate: 22.8 mEq/L (ref 20.0–24.0)
Bicarbonate: 22.8 mEq/L (ref 20.0–24.0)
Bicarbonate: 21.6 mEq/L (ref 20.0–24.0)
Bicarbonate: 26.2 mEq/L — ABNORMAL HIGH (ref 20.0–24.0)
Bicarbonate: 23.6 mEq/L (ref 20.0–24.0)
O2 Saturation: 100 %
O2 Saturation: 70 %
O2 Saturation: 100 %
Patient temperature: 34.7
Patient temperature: 36.6
TCO2: 23 mmol/L (ref 0–100)
TCO2: 28 mmol/L (ref 0–100)
pCO2 arterial: 31.9 mmHg — ABNORMAL LOW (ref 35.0–45.0)
pCO2 arterial: 39.4 mmHg (ref 35.0–45.0)
pCO2 arterial: 52.5 mmHg — ABNORMAL HIGH (ref 35.0–45.0)
pCO2 arterial: 34.7 mmHg — ABNORMAL LOW (ref 35.0–45.0)
pH, Arterial: 7.348 — ABNORMAL LOW (ref 7.350–7.450)
pH, Arterial: 7.268 — ABNORMAL LOW (ref 7.350–7.450)
pH, Arterial: 7.338 — ABNORMAL LOW (ref 7.350–7.450)
pO2, Arterial: 246 mmHg — ABNORMAL HIGH (ref 80.0–100.0)
pO2, Arterial: 41 mmHg — ABNORMAL LOW (ref 80.0–100.0)
pO2, Arterial: 57 mmHg — ABNORMAL LOW (ref 80.0–100.0)
pO2, Arterial: 237 mmHg — ABNORMAL HIGH (ref 80.0–100.0)
pO2, Arterial: 86 mmHg (ref 80.0–100.0)

## 2012-01-22 LAB — CBC
HCT: 31.3 % — ABNORMAL LOW (ref 39.0–52.0)
Hemoglobin: 11.5 g/dL — ABNORMAL LOW (ref 13.0–17.0)
MCH: 32.9 pg (ref 26.0–34.0)
MCH: 33.6 pg (ref 26.0–34.0)
MCHC: 34.6 g/dL (ref 30.0–36.0)
MCHC: 36.7 g/dL — ABNORMAL HIGH (ref 30.0–36.0)
MCV: 93.7 fL (ref 78.0–100.0)
MCV: 91.5 fL (ref 78.0–100.0)
Platelets: 116 10*3/uL — ABNORMAL LOW (ref 150–400)
Platelets: 133 10*3/uL — ABNORMAL LOW (ref 150–400)
Platelets: 126 10*3/uL — ABNORMAL LOW (ref 150–400)
RBC: 3.5 MIL/uL — ABNORMAL LOW (ref 4.22–5.81)
RBC: 3.42 MIL/uL — ABNORMAL LOW (ref 4.22–5.81)
RDW: 14.2 % (ref 11.5–15.5)
RDW: 14.5 % (ref 11.5–15.5)
RDW: 14.5 % (ref 11.5–15.5)
WBC: 6 10*3/uL (ref 4.0–10.5)
WBC: 12.9 10*3/uL — ABNORMAL HIGH (ref 4.0–10.5)

## 2012-01-22 LAB — CARBOXYHEMOGLOBIN
Carboxyhemoglobin: 1.1 % (ref 0.5–1.5)
Methemoglobin: 1.1 % (ref 0.0–1.5)
O2 Saturation: 84.9 %
Total hemoglobin: 11.2 g/dL — ABNORMAL LOW (ref 13.5–18.0)

## 2012-01-22 LAB — POCT I-STAT 4, (NA,K, GLUC, HGB,HCT)
Glucose, Bld: 123 mg/dL — ABNORMAL HIGH (ref 70–99)
Glucose, Bld: 154 mg/dL — ABNORMAL HIGH (ref 70–99)
Glucose, Bld: 168 mg/dL — ABNORMAL HIGH (ref 70–99)
Glucose, Bld: 213 mg/dL — ABNORMAL HIGH (ref 70–99)
HCT: 34 % — ABNORMAL LOW (ref 39.0–52.0)
HCT: 23 % — ABNORMAL LOW (ref 39.0–52.0)
HCT: 21 % — ABNORMAL LOW (ref 39.0–52.0)
HCT: 29 % — ABNORMAL LOW (ref 39.0–52.0)
HCT: 32 % — ABNORMAL LOW (ref 39.0–52.0)
Hemoglobin: 11.6 g/dL — ABNORMAL LOW (ref 13.0–17.0)
Hemoglobin: 7.8 g/dL — ABNORMAL LOW (ref 13.0–17.0)
Hemoglobin: 6.8 g/dL — CL (ref 13.0–17.0)
Hemoglobin: 7.1 g/dL — ABNORMAL LOW (ref 13.0–17.0)
Hemoglobin: 9.9 g/dL — ABNORMAL LOW (ref 13.0–17.0)
Potassium: 4 mEq/L (ref 3.5–5.1)
Potassium: 5.4 mEq/L — ABNORMAL HIGH (ref 3.5–5.1)
Potassium: 4.9 mEq/L (ref 3.5–5.1)
Potassium: 4.3 mEq/L (ref 3.5–5.1)
Sodium: 138 mEq/L (ref 135–145)
Sodium: 139 mEq/L (ref 135–145)
Sodium: 140 mEq/L (ref 135–145)
Sodium: 141 mEq/L (ref 135–145)

## 2012-01-22 LAB — BASIC METABOLIC PANEL
BUN: 17 mg/dL (ref 6–23)
Calcium: 9.8 mg/dL (ref 8.4–10.5)
GFR calc Af Amer: 72 mL/min — ABNORMAL LOW (ref >90)
GFR calc non Af Amer: 62 mL/min — ABNORMAL LOW (ref >90)
Potassium: 4.8 mEq/L (ref 3.5–5.1)
Sodium: 141 mEq/L (ref 135–145)

## 2012-01-22 LAB — HEMOGLOBIN AND HEMATOCRIT, BLOOD
HCT: 24.2 % — ABNORMAL LOW (ref 39.0–52.0)
Hemoglobin: 8.6 g/dL — ABNORMAL LOW (ref 13.0–17.0)

## 2012-01-22 LAB — TYPE AND SCREEN
ABO/RH(D): O POS
Antibody Screen: NEGATIVE
Unit division: 0
Unit division: 0

## 2012-01-22 LAB — POCT I-STAT, CHEM 8
BUN: 12 mg/dL (ref 6–23)
Chloride: 104 mEq/L (ref 96–112)
Creatinine, Ser: 1.1 mg/dL (ref 0.50–1.35)
Potassium: 3.2 mEq/L — ABNORMAL LOW (ref 3.5–5.1)
Sodium: 141 mEq/L (ref 135–145)
TCO2: 21 mmol/L (ref 0–100)

## 2012-01-22 LAB — PLATELET COUNT: Platelets: 71 10*3/uL — ABNORMAL LOW (ref 150–400)

## 2012-01-22 LAB — MAGNESIUM: Magnesium: 2.7 mg/dL — ABNORMAL HIGH (ref 1.5–2.5)

## 2012-01-22 LAB — POCT I-STAT GLUCOSE
Glucose, Bld: 118 mg/dL — ABNORMAL HIGH (ref 70–99)
Operator id: 190201
Operator id: 190201

## 2012-01-22 LAB — CREATININE, SERUM
Creatinine, Ser: 0.96 mg/dL (ref 0.50–1.35)
GFR calc Af Amer: >90 mL/min (ref >90)
GFR calc non Af Amer: 82 mL/min — ABNORMAL LOW (ref >90)

## 2012-01-22 SURGERY — CORONARY ARTERY BYPASS GRAFTING (CABG)
Anesthesia: General | Site: Chest | Wound class: Clean

## 2012-01-22 MED ORDER — FAMOTIDINE IN NACL 20-0.9 MG/50ML-% IV SOLN
20.0000 mg | Freq: Two times a day (BID) | INTRAVENOUS | Status: AC
  Administered 2012-01-23 (×2): 20 mg via INTRAVENOUS
  Filled 2012-01-22 (×2): qty 50

## 2012-01-22 MED ORDER — PANTOPRAZOLE SODIUM 40 MG PO TBEC
40.0000 mg | DELAYED_RELEASE_TABLET | Freq: Every day | ORAL | Status: DC
  Administered 2012-01-24 – 2012-01-25 (×2): 40 mg via ORAL
  Filled 2012-01-22 (×2): qty 1

## 2012-01-22 MED ORDER — HEPARIN SODIUM (PORCINE) 1000 UNIT/ML IJ SOLN
INTRAMUSCULAR | Status: AC
  Filled 2012-01-22: qty 1

## 2012-01-22 MED ORDER — ALBUTEROL SULFATE HFA 108 (90 BASE) MCG/ACT IN AERS
INHALATION_SPRAY | RESPIRATORY_TRACT | Status: DC | PRN
  Administered 2012-01-22: 2 via RESPIRATORY_TRACT
  Administered 2012-01-22: 4 via RESPIRATORY_TRACT

## 2012-01-22 MED ORDER — MIDAZOLAM HCL 2 MG/2ML IJ SOLN: 2.0000 mg | INTRAMUSCULAR | Status: DC | PRN

## 2012-01-22 MED ORDER — 0.9 % SODIUM CHLORIDE (POUR BTL) OPTIME
TOPICAL | Status: DC | PRN
  Administered 2012-01-22: 6000 mL

## 2012-01-22 MED ORDER — MAGNESIUM SULFATE 40 MG/ML IJ SOLN
4.0000 g | Freq: Once | INTRAMUSCULAR | Status: AC
  Administered 2012-01-22: 4 g via INTRAVENOUS
  Filled 2012-01-22: qty 100

## 2012-01-22 MED ORDER — ROCURONIUM BROMIDE 100 MG/10ML IV SOLN
INTRAVENOUS | Status: DC | PRN
  Administered 2012-01-22: 50 mg via INTRAVENOUS

## 2012-01-22 MED ORDER — LACTATED RINGERS IV SOLN
INTRAVENOUS | Status: DC | PRN
  Administered 2012-01-22: 07:00:00 via INTRAVENOUS

## 2012-01-22 MED ORDER — CEFUROXIME SODIUM 1.5 G IJ SOLR
1.5000 g | Freq: Two times a day (BID) | INTRAMUSCULAR | Status: AC
  Administered 2012-01-22 – 2012-01-24 (×4): 1.5 g via INTRAVENOUS
  Filled 2012-01-22 (×4): qty 1.5

## 2012-01-22 MED ORDER — ALBUMIN HUMAN 5 % IV SOLN
INTRAVENOUS | Status: AC
  Filled 2012-01-22: qty 250

## 2012-01-22 MED ORDER — TRANEXAMIC ACID 100 MG/ML IV SOLN
2500.0000 mg | INTRAVENOUS | Status: DC | PRN
  Administered 2012-01-22: 1.5 mg/kg/h via INTRAVENOUS

## 2012-01-22 MED ORDER — FENTANYL CITRATE 0.05 MG/ML IJ SOLN
INTRAMUSCULAR | Status: DC | PRN
  Administered 2012-01-22 (×4): 50 ug via INTRAVENOUS
  Administered 2012-01-22: 100 ug via INTRAVENOUS
  Administered 2012-01-22: 50 ug via INTRAVENOUS
  Administered 2012-01-22: 250 ug via INTRAVENOUS
  Administered 2012-01-22 (×6): 50 ug via INTRAVENOUS
  Administered 2012-01-22: 100 ug via INTRAVENOUS

## 2012-01-22 MED ORDER — FUROSEMIDE 10 MG/ML IJ SOLN
10.0000 mg | Freq: Once | INTRAMUSCULAR | Status: AC
  Administered 2012-01-22: 10 mg via INTRAVENOUS

## 2012-01-22 MED ORDER — SODIUM CHLORIDE 0.9 % IV SOLN: INTRAVENOUS | Status: DC

## 2012-01-22 MED ORDER — METOPROLOL TARTRATE 1 MG/ML IV SOLN: 2.5000 mg | INTRAVENOUS | Status: DC | PRN

## 2012-01-22 MED ORDER — ASPIRIN EC 325 MG PO TBEC
325.0000 mg | DELAYED_RELEASE_TABLET | Freq: Every day | ORAL | Status: DC
  Administered 2012-01-23 – 2012-01-29 (×7): 325 mg via ORAL
  Filled 2012-01-22 (×7): qty 1

## 2012-01-22 MED ORDER — INSULIN REGULAR BOLUS VIA INFUSION
0.0000 [IU] | Freq: Three times a day (TID) | INTRAVENOUS | Status: DC
  Filled 2012-01-22: qty 10

## 2012-01-22 MED ORDER — ACETAMINOPHEN 650 MG RE SUPP
650.0000 mg | RECTAL | Status: AC
  Administered 2012-01-22: 650 mg via RECTAL

## 2012-01-22 MED ORDER — VECURONIUM BROMIDE 10 MG IV SOLR
INTRAVENOUS | Status: DC | PRN
  Administered 2012-01-22: 4 mg via INTRAVENOUS
  Administered 2012-01-22 (×3): 2 mg via INTRAVENOUS
  Administered 2012-01-22: 4 mg via INTRAVENOUS
  Administered 2012-01-22: 2 mg via INTRAVENOUS
  Administered 2012-01-22: 6 mg via INTRAVENOUS
  Administered 2012-01-22: 4 mg via INTRAVENOUS

## 2012-01-22 MED ORDER — SODIUM BICARBONATE 8.4 % IV SOLN
INTRAVENOUS | Status: AC
  Filled 2012-01-22: qty 50

## 2012-01-22 MED ORDER — SODIUM CHLORIDE 0.9 % IJ SOLN
OROMUCOSAL | Status: DC | PRN
  Administered 2012-01-22: 11:00:00 via TOPICAL

## 2012-01-22 MED ORDER — LEVALBUTEROL HCL 1.25 MG/0.5ML IN NEBU
1.2500 mg | INHALATION_SOLUTION | Freq: Three times a day (TID) | RESPIRATORY_TRACT | Status: DC
  Administered 2012-01-22 – 2012-01-27 (×13): 1.25 mg via RESPIRATORY_TRACT
  Filled 2012-01-22 (×19): qty 0.5

## 2012-01-22 MED ORDER — HEMOSTATIC AGENTS (NO CHARGE) OPTIME
TOPICAL | Status: DC | PRN
  Administered 2012-01-22 (×2): 1 via TOPICAL

## 2012-01-22 MED ORDER — ACETAMINOPHEN 160 MG/5ML PO SOLN: 650.0000 mg | ORAL | Status: AC

## 2012-01-22 MED ORDER — LACTATED RINGERS IV SOLN
INTRAVENOUS | Status: DC
  Administered 2012-01-22: 16:00:00 via INTRAVENOUS

## 2012-01-22 MED ORDER — BISACODYL 10 MG RE SUPP: 10.0000 mg | Freq: Every day | RECTAL | Status: DC

## 2012-01-22 MED ORDER — SODIUM BICARBONATE 8.4 % IV SOLN
INTRAVENOUS | Status: DC | PRN
  Administered 2012-01-22: 42 mg via INTRAVENOUS
  Administered 2012-01-22: 84 mg via INTRAVENOUS

## 2012-01-22 MED ORDER — SODIUM CHLORIDE 0.9 % IJ SOLN
OROMUCOSAL | Status: DC | PRN
  Administered 2012-01-22: 10:00:00 via TOPICAL

## 2012-01-22 MED ORDER — ACETAMINOPHEN 500 MG PO TABS
1000.0000 mg | ORAL_TABLET | Freq: Four times a day (QID) | ORAL | Status: AC
  Administered 2012-01-23 – 2012-01-27 (×14): 1000 mg via ORAL
  Filled 2012-01-22 (×19): qty 2

## 2012-01-22 MED ORDER — METOPROLOL TARTRATE 12.5 MG HALF TABLET
12.5000 mg | ORAL_TABLET | Freq: Two times a day (BID) | ORAL | Status: DC
  Administered 2012-01-24 – 2012-01-26 (×3): 12.5 mg via ORAL
  Filled 2012-01-22 (×9): qty 1

## 2012-01-22 MED ORDER — NOREPINEPHRINE BITARTRATE 1 MG/ML IJ SOLN
2.0000 ug/min | INTRAVENOUS | Status: DC
  Administered 2012-01-22: 10 ug/min via INTRAVENOUS
  Filled 2012-01-22 (×2): qty 4

## 2012-01-22 MED ORDER — EPINEPHRINE HCL 0.1 MG/ML IJ SOLN
INTRAMUSCULAR | Status: DC | PRN
  Administered 2012-01-22 (×2): .2 ug via INTRAVENOUS
  Administered 2012-01-22: .1 ug via INTRAVENOUS
  Administered 2012-01-22: .2 ug via INTRAVENOUS
  Administered 2012-01-22 (×3): .1 ug via INTRAVENOUS

## 2012-01-22 MED ORDER — NITROGLYCERIN IN D5W 200-5 MCG/ML-% IV SOLN: 0.0000 ug/min | INTRAVENOUS | Status: DC

## 2012-01-22 MED ORDER — BISACODYL 5 MG PO TBEC
10.0000 mg | DELAYED_RELEASE_TABLET | Freq: Every day | ORAL | Status: DC
  Administered 2012-01-23 – 2012-01-25 (×3): 10 mg via ORAL
  Filled 2012-01-22 (×5): qty 2

## 2012-01-22 MED ORDER — MORPHINE SULFATE 2 MG/ML IJ SOLN
2.0000 mg | INTRAMUSCULAR | Status: DC | PRN
  Administered 2012-01-23 – 2012-01-25 (×11): 2 mg via INTRAVENOUS
  Filled 2012-01-22 (×12): qty 1

## 2012-01-22 MED ORDER — MILRINONE IN DEXTROSE 200-5 MCG/ML-% IV SOLN
0.3000 ug/kg/min | INTRAVENOUS | Status: DC
  Administered 2012-01-22: 0.2 ug/kg/min via INTRAVENOUS
  Filled 2012-01-22: qty 100

## 2012-01-22 MED ORDER — ONDANSETRON HCL 4 MG/2ML IJ SOLN: 4.0000 mg | Freq: Four times a day (QID) | INTRAMUSCULAR | Status: DC | PRN

## 2012-01-22 MED ORDER — SODIUM CHLORIDE 0.9 % IJ SOLN: 3.0000 mL | INTRAMUSCULAR | Status: DC | PRN

## 2012-01-22 MED ORDER — SODIUM CHLORIDE 0.9 % IV SOLN
INTRAVENOUS | Status: DC
  Administered 2012-01-23 (×2): via INTRAVENOUS
  Filled 2012-01-22 (×2): qty 1

## 2012-01-22 MED ORDER — POTASSIUM CHLORIDE 10 MEQ/50ML IV SOLN
10.0000 meq | INTRAVENOUS | Status: AC
  Administered 2012-01-22 (×3): 10 meq via INTRAVENOUS

## 2012-01-22 MED ORDER — ALBUMIN HUMAN 5 % IV SOLN
250.0000 mL | INTRAVENOUS | Status: AC | PRN
  Administered 2012-01-22 – 2012-01-23 (×3): 250 mL via INTRAVENOUS
  Filled 2012-01-22: qty 250

## 2012-01-22 MED ORDER — ALBUMIN HUMAN 5 % IV SOLN
INTRAVENOUS | Status: DC | PRN
  Administered 2012-01-22 (×2): via INTRAVENOUS

## 2012-01-22 MED ORDER — LIDOCAINE HCL (CARDIAC) 20 MG/ML IV SOLN
INTRAVENOUS | Status: DC | PRN
  Administered 2012-01-22: 80 mg via INTRAVENOUS

## 2012-01-22 MED ORDER — NOREPINEPHRINE BITARTRATE 1 MG/ML IJ SOLN
4000.0000 ug | INTRAVENOUS | Status: DC | PRN
  Administered 2012-01-22: 10 ug/min via INTRAVENOUS

## 2012-01-22 MED ORDER — SODIUM CHLORIDE 0.45 % IV SOLN
INTRAVENOUS | Status: DC
  Administered 2012-01-22: 16:00:00 via INTRAVENOUS

## 2012-01-22 MED ORDER — CHLORHEXIDINE GLUCONATE 4 % EX LIQD
CUTANEOUS | Status: AC
  Filled 2012-01-22: qty 60

## 2012-01-22 MED ORDER — VANCOMYCIN HCL IN DEXTROSE 1-5 GM/200ML-% IV SOLN
1000.0000 mg | Freq: Once | INTRAVENOUS | Status: AC
  Administered 2012-01-22: 1000 mg via INTRAVENOUS
  Filled 2012-01-22: qty 200

## 2012-01-22 MED ORDER — NOREPINEPHRINE BITARTRATE 1 MG/ML IJ SOLN
8.0000 ug/min | INTRAVENOUS | Status: DC
  Administered 2012-01-22: 6 ug/min via INTRAVENOUS
  Filled 2012-01-22: qty 4

## 2012-01-22 MED ORDER — METOPROLOL TARTRATE 25 MG/10 ML ORAL SUSPENSION
12.5000 mg | Freq: Two times a day (BID) | ORAL | Status: DC
  Filled 2012-01-22 (×9): qty 5

## 2012-01-22 MED ORDER — ACETAMINOPHEN 160 MG/5ML PO SOLN
975.0000 mg | Freq: Four times a day (QID) | ORAL | Status: AC
  Administered 2012-01-23: 975 mg
  Filled 2012-01-22: qty 40.6

## 2012-01-22 MED ORDER — ASPIRIN 81 MG PO CHEW: 324.0000 mg | CHEWABLE_TABLET | Freq: Every day | ORAL | Status: DC

## 2012-01-22 MED ORDER — MILRINONE LOAD VIA INFUSION
INTRAVENOUS | Status: DC | PRN
  Administered 2012-01-22: 3250 ug via INTRAVENOUS

## 2012-01-22 MED ORDER — MILRINONE IN DEXTROSE 200-5 MCG/ML-% IV SOLN: INTRAVENOUS | Status: DC | PRN

## 2012-01-22 MED ORDER — SODIUM CHLORIDE 0.9 % IV SOLN: 250.0000 mL | INTRAVENOUS | Status: DC

## 2012-01-22 MED ORDER — PROTAMINE SULFATE 10 MG/ML IV SOLN
INTRAVENOUS | Status: DC | PRN
  Administered 2012-01-22: 240 mg via INTRAVENOUS
  Administered 2012-01-22: 10 mg via INTRAVENOUS

## 2012-01-22 MED ORDER — METHYLPREDNISOLONE SODIUM SUCC 125 MG IJ SOLR
INTRAMUSCULAR | Status: DC | PRN
  Administered 2012-01-22: 125 mg via INTRAVENOUS

## 2012-01-22 MED ORDER — PROPOFOL 10 MG/ML IV EMUL
INTRAVENOUS | Status: DC | PRN
  Administered 2012-01-22: 50 mg via INTRAVENOUS

## 2012-01-22 MED ORDER — LACTATED RINGERS IV SOLN: 500.0000 mL | Freq: Once | INTRAVENOUS | Status: AC | PRN

## 2012-01-22 MED ORDER — HEMOSTATIC AGENTS (NO CHARGE) OPTIME
TOPICAL | Status: DC | PRN
  Administered 2012-01-22: 1 via TOPICAL

## 2012-01-22 MED ORDER — MILRINONE IN DEXTROSE 200-5 MCG/ML-% IV SOLN
INTRAVENOUS | Status: DC | PRN
  Administered 2012-01-22: .3 ug/kg/min via INTRAVENOUS

## 2012-01-22 MED ORDER — HEPARIN SODIUM (PORCINE) 1000 UNIT/ML IJ SOLN
INTRAMUSCULAR | Status: DC | PRN
  Administered 2012-01-22: 23000 [IU] via INTRAVENOUS
  Administered 2012-01-22: 7000 [IU] via INTRAVENOUS
  Administered 2012-01-22: 5000 [IU] via INTRAVENOUS

## 2012-01-22 MED ORDER — SODIUM CHLORIDE 0.9 % IV SOLN
INTRAVENOUS | Status: DC | PRN
  Administered 2012-01-22: 07:00:00 via INTRAVENOUS

## 2012-01-22 MED ORDER — MORPHINE SULFATE 2 MG/ML IJ SOLN: 1.0000 mg | INTRAMUSCULAR | Status: AC | PRN

## 2012-01-22 MED ORDER — DEXMEDETOMIDINE HCL IN NACL 200 MCG/50ML IV SOLN
0.1000 ug/kg/h | INTRAVENOUS | Status: DC
  Administered 2012-01-22 – 2012-01-23 (×3): 0.7 ug/kg/h via INTRAVENOUS
  Administered 2012-01-23: 0.4 ug/kg/h via INTRAVENOUS
  Filled 2012-01-22 (×4): qty 50

## 2012-01-22 MED ORDER — MILRINONE LOAD VIA INFUSION: INTRAVENOUS | Status: DC | PRN

## 2012-01-22 MED ORDER — SODIUM CHLORIDE 0.9 % IV SOLN
INTRAVENOUS | Status: DC | PRN
  Administered 2012-01-22: 13:00:00 via INTRAVENOUS

## 2012-01-22 MED ORDER — GLYCOPYRROLATE 0.2 MG/ML IJ SOLN
INTRAMUSCULAR | Status: DC | PRN
  Administered 2012-01-22: .4 mg via INTRAVENOUS

## 2012-01-22 MED ORDER — POTASSIUM CHLORIDE 10 MEQ/50ML IV SOLN: 10.0000 meq | INTRAVENOUS | Status: AC

## 2012-01-22 MED ORDER — DOCUSATE SODIUM 100 MG PO CAPS
200.0000 mg | ORAL_CAPSULE | Freq: Every day | ORAL | Status: DC
  Administered 2012-01-23 – 2012-01-29 (×7): 200 mg via ORAL
  Filled 2012-01-22 (×7): qty 2

## 2012-01-22 MED ORDER — DOPAMINE-DEXTROSE 3.2-5 MG/ML-% IV SOLN
0.0000 ug/kg/min | INTRAVENOUS | Status: DC
  Filled 2012-01-22: qty 250

## 2012-01-22 MED ORDER — PHENYLEPHRINE HCL 10 MG/ML IJ SOLN
0.0000 ug/min | INTRAVENOUS | Status: DC
  Filled 2012-01-22: qty 2

## 2012-01-22 MED ORDER — EPHEDRINE SULFATE 50 MG/ML IJ SOLN
INTRAMUSCULAR | Status: DC | PRN
  Administered 2012-01-22 (×4): 5 mg via INTRAVENOUS

## 2012-01-22 MED ORDER — OXYCODONE HCL 5 MG PO TABS
5.0000 mg | ORAL_TABLET | ORAL | Status: DC | PRN
  Administered 2012-01-24: 10 mg via ORAL
  Filled 2012-01-22: qty 2

## 2012-01-22 MED ORDER — MIDAZOLAM HCL 5 MG/5ML IJ SOLN
INTRAMUSCULAR | Status: DC | PRN
  Administered 2012-01-22 (×3): 2 mg via INTRAVENOUS
  Administered 2012-01-22: 4 mg via INTRAVENOUS

## 2012-01-22 MED ORDER — SODIUM CHLORIDE 0.9 % IJ SOLN
3.0000 mL | Freq: Two times a day (BID) | INTRAMUSCULAR | Status: DC
  Administered 2012-01-23 – 2012-01-28 (×10): 3 mL via INTRAVENOUS

## 2012-01-22 MED ORDER — LIDOCAINE HCL (CARDIAC) 20 MG/ML IV SOLN
INTRAVENOUS | Status: AC
  Filled 2012-01-22: qty 5

## 2012-01-22 MED ORDER — SODIUM CHLORIDE 0.9 % IR SOLN
Status: DC | PRN
  Administered 2012-01-22: 10:00:00

## 2012-01-22 SURGICAL SUPPLY — 147 items
ADAPTER CARDIO PERF ANTE/RETRO (ADAPTER) ×4 IMPLANT
ADPR PRFSN 84XANTGRD RTRGD (ADAPTER) ×2
APL SKNCLS STERI-STRIP NONHPOA (GAUZE/BANDAGES/DRESSINGS) ×2
ATTRACTOMAT 16X20 MAGNETIC DRP (DRAPES) ×4 IMPLANT
BAG DECANTER FOR FLEXI CONT (MISCELLANEOUS) ×4 IMPLANT
BANDAGE ELASTIC 4 VELCRO ST LF (GAUZE/BANDAGES/DRESSINGS) ×4 IMPLANT
BANDAGE ELASTIC 6 VELCRO ST LF (GAUZE/BANDAGES/DRESSINGS) ×4 IMPLANT
BANDAGE GAUZE ELAST BULKY 4 IN (GAUZE/BANDAGES/DRESSINGS) ×4 IMPLANT
BASKET HEART  (ORDER IN 25'S) (MISCELLANEOUS) ×1
BASKET HEART (ORDER IN 25'S) (MISCELLANEOUS) ×1
BASKET HEART (ORDER IN 25S) (MISCELLANEOUS) ×2 IMPLANT
BENZOIN TINCTURE PRP APPL 2/3 (GAUZE/BANDAGES/DRESSINGS) ×4 IMPLANT
BLADE STERNUM SYSTEM 6 (BLADE) ×4 IMPLANT
BLADE SURG 11 STRL SS (BLADE) ×2 IMPLANT
BLADE SURG 12 STRL SS (BLADE) ×4 IMPLANT
BLADE SURG ROTATE 9660 (MISCELLANEOUS) IMPLANT
BOOT SUTURE AID YELLOW STND (SUTURE) ×2 IMPLANT
CANISTER SUCTION 2500CC (MISCELLANEOUS) ×8 IMPLANT
CANNULA AORTIC HI-FLOW 6.5M20F (CANNULA) ×4 IMPLANT
CANNULA GUNDRY RCSP 15FR (MISCELLANEOUS) ×4 IMPLANT
CANNULA VENOUS MAL SGL STG 40 (MISCELLANEOUS) IMPLANT
CANNULAE VENOUS MAL SGL STG 40 (MISCELLANEOUS)
CATH CPB KIT VANTRIGT (MISCELLANEOUS) ×4 IMPLANT
CATH ROBINSON RED A/P 18FR (CATHETERS) ×16 IMPLANT
CATH THORACIC 28FR (CATHETERS) IMPLANT
CATH THORACIC 28FR RT ANG (CATHETERS) IMPLANT
CATH THORACIC 36FR (CATHETERS) IMPLANT
CATH THORACIC 36FR RT ANG (CATHETERS) ×8 IMPLANT
CLIP LIGATING EXTRA MED SLVR (CLIP) ×6 IMPLANT
CLIP LIGATING EXTRA SM BLUE (MISCELLANEOUS) ×6 IMPLANT
CLIP TI WIDE RED SMALL 24 (CLIP) ×4 IMPLANT
CLOSURE WOUND 1/2 X4 (GAUZE/BANDAGES/DRESSINGS) ×1
CLOTH BEACON ORANGE TIMEOUT ST (SAFETY) ×8 IMPLANT
CONN 3/8X3/8 GISH STERILE (MISCELLANEOUS) ×2 IMPLANT
COVER SURGICAL LIGHT HANDLE (MISCELLANEOUS) ×8 IMPLANT
CRADLE DONUT ADULT HEAD (MISCELLANEOUS) ×8 IMPLANT
DECANTER SPIKE VIAL GLASS SM (MISCELLANEOUS) IMPLANT
DRAIN CHANNEL 32F RND 10.7 FF (WOUND CARE) ×4 IMPLANT
DRAIN HEMOVAC 1/8 X 5 (WOUND CARE) IMPLANT
DRAPE CARDIOVASCULAR INCISE (DRAPES) ×4
DRAPE SLUSH MACHINE 52X66 (DRAPES) IMPLANT
DRAPE SLUSH/WARMER DISC (DRAPES) IMPLANT
DRAPE SRG 135X102X78XABS (DRAPES) ×2 IMPLANT
DRAPE WARM FLUID 44X44 (DRAPE) ×4 IMPLANT
DRSG COVADERM 4X14 (GAUZE/BANDAGES/DRESSINGS) ×4 IMPLANT
ELECT BLADE 4.0 EZ CLEAN MEGAD (MISCELLANEOUS) ×4
ELECT BLADE 6.5 EXT (BLADE) ×4 IMPLANT
ELECT CAUTERY BLADE 6.4 (BLADE) ×4 IMPLANT
ELECT REM PT RETURN 9FT ADLT (ELECTROSURGICAL) ×12
ELECTRODE BLDE 4.0 EZ CLN MEGD (MISCELLANEOUS) ×2 IMPLANT
ELECTRODE REM PT RTRN 9FT ADLT (ELECTROSURGICAL) ×6 IMPLANT
EVACUATOR SILICONE 100CC (DRAIN) IMPLANT
GAUZE XEROFORM 1X8 LF (GAUZE/BANDAGES/DRESSINGS) ×2 IMPLANT
GAUZE XEROFORM 5X9 LF (GAUZE/BANDAGES/DRESSINGS) ×2 IMPLANT
GEL ULTRASOUND 20GR AQUASONIC (MISCELLANEOUS) IMPLANT
GLOVE BIO SURGEON STRL SZ 6 (GLOVE) ×6 IMPLANT
GLOVE BIO SURGEON STRL SZ 6.5 (GLOVE) ×3 IMPLANT
GLOVE BIO SURGEON STRL SZ7 (GLOVE) ×6 IMPLANT
GLOVE BIO SURGEON STRL SZ7.5 (GLOVE) ×14 IMPLANT
GLOVE BIO SURGEONS STRL SZ 6.5 (GLOVE) ×3
GLOVE BIOGEL PI IND STRL 6 (GLOVE) IMPLANT
GLOVE BIOGEL PI IND STRL 7.0 (GLOVE) IMPLANT
GLOVE BIOGEL PI INDICATOR 6 (GLOVE) ×2
GLOVE BIOGEL PI INDICATOR 7.0 (GLOVE) ×4
GLOVE SS BIOGEL STRL SZ 7.5 (GLOVE) ×2 IMPLANT
GLOVE SUPERSENSE BIOGEL SZ 7.5 (GLOVE) ×6
GOWN STRL NON-REIN LRG LVL3 (GOWN DISPOSABLE) ×34 IMPLANT
HEMOSTAT POWDER SURGIFOAM 1G (HEMOSTASIS) ×12 IMPLANT
HEMOSTAT SURGICEL 2X14 (HEMOSTASIS) ×6 IMPLANT
INSERT FOGARTY XLG (MISCELLANEOUS) IMPLANT
KIT BASIN OR (CUSTOM PROCEDURE TRAY) ×8 IMPLANT
KIT ROOM TURNOVER OR (KITS) ×8 IMPLANT
KIT SUCTION CATH 14FR (SUCTIONS) ×4 IMPLANT
KIT VASOVIEW W/TROCAR VH 2000 (KITS) ×4 IMPLANT
LEAD PACING MYOCARDI (MISCELLANEOUS) ×4 IMPLANT
LOOP VESSEL MAXI BLUE (MISCELLANEOUS) ×2 IMPLANT
LOOP VESSEL MINI RED (MISCELLANEOUS) ×2 IMPLANT
MARKER GRAFT CORONARY BYPASS (MISCELLANEOUS) ×12 IMPLANT
NEEDLE 22X1 1/2 (OR ONLY) (NEEDLE) IMPLANT
NS IRRIG 1000ML POUR BTL (IV SOLUTION) ×28 IMPLANT
PACK CAROTID (CUSTOM PROCEDURE TRAY) ×4 IMPLANT
PACK OPEN HEART (CUSTOM PROCEDURE TRAY) ×4 IMPLANT
PAD ARMBOARD 7.5X6 YLW CONV (MISCELLANEOUS) ×16 IMPLANT
PATCH HEMASHIELD 8X75 (Vascular Products) ×2 IMPLANT
PENCIL BUTTON HOLSTER BLD 10FT (ELECTRODE) ×4 IMPLANT
PUNCH AORTIC ROTATE 4.0MM (MISCELLANEOUS) ×2 IMPLANT
PUNCH AORTIC ROTATE 4.5MM 8IN (MISCELLANEOUS) IMPLANT
PUNCH AORTIC ROTATE 5MM 8IN (MISCELLANEOUS) IMPLANT
SET CARDIOPLEGIA MPS 5001102 (MISCELLANEOUS) ×2 IMPLANT
SHUNT CAROTID BYPASS 10 (VASCULAR PRODUCTS) ×2 IMPLANT
SHUNT CAROTID BYPASS 12FRX15.5 (VASCULAR PRODUCTS) IMPLANT
SPECIMEN JAR SMALL (MISCELLANEOUS) ×4 IMPLANT
SPONGE GAUZE 4X4 12PLY (GAUZE/BANDAGES/DRESSINGS) ×10 IMPLANT
SPONGE LAP 18X18 X RAY DECT (DISPOSABLE) ×4 IMPLANT
SPONGE LAP 4X18 X RAY DECT (DISPOSABLE) ×2 IMPLANT
STAPLER VISISTAT 35W (STAPLE) ×2 IMPLANT
STOPCOCK MORSE 400PSI 3WAY (MISCELLANEOUS) ×4 IMPLANT
STRIP CLOSURE SKIN 1/2X4 (GAUZE/BANDAGES/DRESSINGS) ×3 IMPLANT
SURGIFLO TRUKIT (HEMOSTASIS) ×2 IMPLANT
SUT BONE WAX W31G (SUTURE) ×4 IMPLANT
SUT ETHILON 2 0 FS 18 (SUTURE) ×4 IMPLANT
SUT ETHILON 3 0 PS 1 (SUTURE) IMPLANT
SUT MNCRL AB 4-0 PS2 18 (SUTURE) ×2 IMPLANT
SUT PROLENE 3 0 SH DA (SUTURE) IMPLANT
SUT PROLENE 3 0 SH1 36 (SUTURE) IMPLANT
SUT PROLENE 4 0 RB 1 (SUTURE) ×4
SUT PROLENE 4 0 SH DA (SUTURE) ×6 IMPLANT
SUT PROLENE 4-0 RB1 .5 CRCL 36 (SUTURE) ×2 IMPLANT
SUT PROLENE 5 0 C 1 36 (SUTURE) IMPLANT
SUT PROLENE 6 0 C 1 30 (SUTURE) ×2 IMPLANT
SUT PROLENE 6 0 CC (SUTURE) ×10 IMPLANT
SUT PROLENE 7 0 DA (SUTURE) IMPLANT
SUT PROLENE 7.0 RB 3 (SUTURE) ×12 IMPLANT
SUT PROLENE 8 0 BV175 6 (SUTURE) ×4 IMPLANT
SUT PROLENE BLUE 7 0 (SUTURE) ×6 IMPLANT
SUT SILK  1 MH (SUTURE) ×2
SUT SILK 1 MH (SUTURE) IMPLANT
SUT SILK 2 0 SH CR/8 (SUTURE) ×4 IMPLANT
SUT SILK 3 0 SH CR/8 (SUTURE) ×2 IMPLANT
SUT STEEL 6MS V (SUTURE) ×10 IMPLANT
SUT STEEL STERNAL CCS#1 18IN (SUTURE) IMPLANT
SUT STEEL SZ 6 DBL 3X14 BALL (SUTURE) ×6 IMPLANT
SUT VIC AB 1 CTX 36 (SUTURE) ×8
SUT VIC AB 1 CTX36XBRD ANBCTR (SUTURE) ×4 IMPLANT
SUT VIC AB 2-0 CT1 27 (SUTURE) ×4
SUT VIC AB 2-0 CT1 36 (SUTURE) ×2 IMPLANT
SUT VIC AB 2-0 CT1 TAPERPNT 27 (SUTURE) IMPLANT
SUT VIC AB 2-0 CTX 27 (SUTURE) IMPLANT
SUT VIC AB 3-0 SH 27 (SUTURE) ×8
SUT VIC AB 3-0 SH 27X BRD (SUTURE) ×4 IMPLANT
SUT VIC AB 3-0 SH 8-18 (SUTURE) ×2 IMPLANT
SUT VIC AB 3-0 X1 27 (SUTURE) ×2 IMPLANT
SUT VICRYL 4-0 PS2 18IN ABS (SUTURE) ×4 IMPLANT
SUTURE E-PAK OPEN HEART (SUTURE) ×4 IMPLANT
SYR 20CC LL (SYRINGE) ×2 IMPLANT
SYR CONTROL 10ML LL (SYRINGE) IMPLANT
SYSTEM SAHARA CHEST DRAIN ATS (WOUND CARE) ×4 IMPLANT
TAPE CLOTH SURG 4X10 WHT LF (GAUZE/BANDAGES/DRESSINGS) ×4 IMPLANT
TAPE PAPER 2X10 WHT MICROPORE (GAUZE/BANDAGES/DRESSINGS) ×2 IMPLANT
TAPE UMBILICAL COTTON 1/8X30 (MISCELLANEOUS) ×2 IMPLANT
TOWEL OR 17X24 6PK STRL BLUE (TOWEL DISPOSABLE) ×12 IMPLANT
TOWEL OR 17X26 10 PK STRL BLUE (TOWEL DISPOSABLE) ×12 IMPLANT
TRAY CATH LUMEN 1 20CM STRL (SET/KITS/TRAYS/PACK) ×4 IMPLANT
TRAY FOLEY IC TEMP SENS 14FR (CATHETERS) ×4 IMPLANT
TUBING INSUFFLATION 10FT LAP (TUBING) ×4 IMPLANT
UNDERPAD 30X30 INCONTINENT (UNDERPADS AND DIAPERS) ×4 IMPLANT
WATER STERILE IRR 1000ML POUR (IV SOLUTION) ×12 IMPLANT

## 2012-01-22 NOTE — OR Nursing (Signed)
2nd call to SICU @ 14:40pm

## 2012-01-22 NOTE — Op Note (Signed)
[facility]  Patient name: Charles Hatfield MRN: [id number] DOB: 06/21/1941 Sex: male  01/13/2012 - 01/22/2012 Pre-operative Diagnosis: Asymptomatic left carotid stenosis Post-operative diagnosis:  Same Surgeon:  Larina Earthly, M.D. Assistants:  Roczniak Procedure:    left carotid Endarterectomy with Dacron patch angioplasty Anesthesia:  General Blood Loss:  See anesthesia record Specimens:  Carotid Plaque to pathology  Indications for surgery:  Severe asymptomatic bilateral carotid stenosis and critical coronary disease  Procedure in detail:  The patient was taken to the operating and placed in the supine position. The neck was prepped and draped in the usual sterile fashion. An incision was made anterior to the sternocleidomastoid muscle and continued with electrocautery through the platysma muscle. The muscle was retracted posteriorly and the carotid sheath was opened. The facial vein was ligated with 2-0 silk ties and divided. The common carotid artery was encircled with an umbilical tape and Rummel tourniquet. Dissection was continued onto the carotid bifurcation. The superior thyroid artery was controlled with a 2-0 silk Potts tie. The external carotid organ was encircled with a vessel loop and the internal carotid was encircled with umbilical tape and Rummel tourniquet. The hypoglossal and vagus nerves were identified and preserved.  The patient was given systemic heparinization. After adequate circulation time, the internal,external and common carotid arteries were occluded. The common carotid was opened with an 11 blade and the arteriotomy was continued with Potts scissors onto the internal carotid artery. A 10 shunt was passed up the internal carotid artery, allowed to back bleed, and then passed down the common carotid artery. The shunt was secured with Rummel tourniquet. The endarterectomy was begun on the common carotid artery  plaque was divided proximally  with Potts scissors. The endarterectomy was continued onto the carotid bifurcation. The external carotid was endarterectomized by eversion technique and the internal carotid artery was endarterectomized in an open fashion. Remaining debris was removed from the endarterectomy plane. A Dacron patch was brought to the field and sewn as a patch angioplasty. Prior to completing the anastomosis, the shunt was removed and the usual flushing maneuvers were undertaken. The anastomosis was then completed and flow was restored first to the external and then the internal carotid artery. Excellent flow characteristics were noted with hand-held Doppler in the internal and external carotid arteries.  Surgicel and a Ray-Tec gauze were placed in the base of the wound. The wound was closed temporarily with 2-0 nylon mattress sutures. The patient was then for underwent coronary artery bypass grafting as planned which will be dictated as a separate note      Larina Earthly, M.D. [facility] Office: [phone] Pager:  [phone]

## 2012-01-22 NOTE — Transfer of Care (Signed)
Immediate Anesthesia Transfer of Care Note  Patient: Charles Hatfield  Procedure(s) Performed: Procedure(s) (LRB): CORONARY ARTERY BYPASS GRAFTING (CABG) (N/A) ENDARTERECTOMY CAROTID (Left)  Patient Location: PACU and SICU  Anesthesia Type: General  Level of Consciousness: Patient remains intubated per anesthesia plan  Airway & Oxygen Therapy: Patient remains intubated per anesthesia plan  Post-op Assessment: Post -op Vital signs reviewed and stable  Post vital signs: Reviewed and stable patient transported to SICU on ventilator secondary to oxygen  Desaturation  During case  Complications: No apparent anesthesia complications

## 2012-01-22 NOTE — Interval H&P Note (Signed)
History and Physical Interval Note:  01/22/2012 7:14 AM  Charles Hatfield  has presented today for surgery, with the diagnosis of CAD  The various methods of treatment have been discussed with the patient and family. After consideration of risks, benefits and other options for treatment, the patient has consented to  Procedure(s) (LRB): CORONARY ARTERY BYPASS GRAFTING (CABG) (N/A) ENDARTERECTOMY CAROTID (Left) as a surgical intervention .  The patient's history has been reviewed, patient examined, no change in status, stable for surgery.  I have reviewed the patient's chart and labs.  Questions were answered to the patient's satisfaction.     Antwyne Pingree

## 2012-01-22 NOTE — Anesthesia Preprocedure Evaluation (Addendum)
Anesthesia Evaluation  Patient identified by MRN, date of birth, ID band Patient awake    Reviewed: Allergy & Precautions, H&P , NPO status , Patient's Chart, lab work & pertinent test results, reviewed documented beta blocker date and time   Airway Mallampati: II TM Distance: <3 FB     Dental  (+) Edentulous Upper and Edentulous Lower   Pulmonary COPD breath sounds clear to auscultation        Cardiovascular hypertension, Pt. on home beta blockers + CAD and + Past MI Rhythm:Regular Rate:Normal     Neuro/Psych CVA    GI/Hepatic   Endo/Other    Renal/GU      Musculoskeletal   Abdominal (+) + obese,   Peds  Hematology   Anesthesia Other Findings   Reproductive/Obstetrics                         Anesthesia Physical Anesthesia Plan  ASA: IV  Anesthesia Plan: General   Post-op Pain Management:    Induction: Intravenous  Airway Management Planned: Oral ETT  Additional Equipment: Arterial line, PA Cath, Ultrasound Guidance Line Placement and CVP  Intra-op Plan:   Post-operative Plan: Post-operative intubation/ventilation and Extubation in OR  Informed Consent: I have reviewed the patients History and Physical, chart, labs and discussed the procedure including the risks, benefits and alternatives for the proposed anesthesia with the patient or authorized representative who has indicated his/her understanding and acceptance.     Plan Discussed with: Anesthesiologist and Surgeon  Anesthesia Plan Comments:         Anesthesia Quick Evaluation

## 2012-01-22 NOTE — Progress Notes (Signed)
  Echocardiogram Echocardiogram Transesophageal (OR) has been performed.  Charles Hatfield 01/22/2012, 8:16 AM

## 2012-01-22 NOTE — Progress Notes (Signed)
S/p CEA/ CABG  Intubated, sedated  BP 95/42  Pulse 90  Temp 97.5 F (36.4 C) (Oral)  Resp 14  Ht 5\' 6"  (1.676 m)  Wt 143 lb 15.4 oz (65.3 kg)  BMI 23.24 kg/m2  SpO2 96%   Intake/Output Summary (Last 24 hours) at 01/22/12 1819 Last data filed at 01/22/12 1800  Gross per 24 hour  Intake 8745.09 ml  Output   5305 ml  Net 3440.09 ml    On NO, levophed, dopamine, milrinone  Good cardiac index.  Continue current care

## 2012-01-22 NOTE — Brief Op Note (Signed)
01/13/2012 - 01/22/2012  12:03 PM  PATIENT:  Carlota Raspberry  71 y.o. male  PRE-OPERATIVE DIAGNOSIS:  CAD, Carotid stenosis  POST-OPERATIVE DIAGNOSIS:  CAD, Carotid stenosis  PROCEDURE:  Procedure(s) (LRB):  CORONARY ARTERY BYPASS GRAFTING x3  LIMA to LAD  SVG to Diagonal  SVG to PLVB  ENDOSCOPIC SAPHENOUS VEIN HARVEST RIGHT THIGH TO MID CALF   ENDARTERECTOMY CAROTID (Left)  SURGEON:  Surgeon(s) and Role: Panel 1:    * Kerin Perna, MD - Primary  Panel 2:    * Larina Earthly, MD - Primary  PHYSICIAN ASSISTANT: 1. Makih Stefanko PA-C 2. Regina Rozniack  ANESTHESIA:   general  EBL:  Total I/O In: 1250 [I.V.:1250] Out: 325 [Urine:325]  BLOOD ADMINISTERED: CC CELLSAVER  DRAINS: JP drain left neck, left pleural chest tube, mediastinal drains   LOCAL MEDICATIONS USED:  NONE  SPECIMEN:  Source of Specimen:  Left Carotid Artery  DISPOSITION OF SPECIMEN:  PATHOLOGY  COUNTS:  YES  TOURNIQUET:  * No tourniquets in log *  DICTATION: .Dragon Dictation  PLAN OF CARE: Admit to inpatient   PATIENT DISPOSITION:  ICU - intubated and hemodynamically stable.   Delay start of Pharmacological VTE agent (>24hrs) due to surgical blood loss or risk of bleeding: yes

## 2012-01-22 NOTE — Preoperative (Signed)
Beta Blockers   Reason not to administer Beta Blockers:Metoprolol given 12.5 mg this am 01/22/12

## 2012-01-22 NOTE — H&P (View-Only) (Signed)
VASCULAR PROGRESS NOTE  SUBJECTIVE: no new complaints. Currently no chest pain.  PHYSICAL EXAM: Filed Vitals:   01/19/12 0345 01/19/12 0400 01/19/12 0730 01/19/12 1200  BP: 96/63  127/93 95/66  Pulse:   65 60  Temp:  98 F (36.7 C) 98.2 F (36.8 C) 97.8 F (36.6 C)  TempSrc:  Oral Oral Oral  Resp: 22  23 21  Height:      Weight:      SpO2:  97% 95% 96%   Good strength in upper and lower extremities bilaterally. Patient with expressive aphasia which is chronic  LABS: Lab Results  Component Value Date   WBC 5.7 01/19/2012   HGB 13.3 01/19/2012   HCT 37.7* 01/19/2012   MCV 94.7 01/19/2012   PLT 105* 01/19/2012   Lab Results  Component Value Date   CREATININE 1.14 01/19/2012   Lab Results  Component Value Date   INR 1.15 01/16/2012   CBG (last 3)   Basename 01/19/12 1223 01/19/12 0736 01/18/12 1711  GLUCAP 89 86 141*   I have reviewed his CT angiogram. The stenosis on the left is fairly focal and does appear to be surgically accessible.   ASSESSMENT/PLAN: Patient is scheduled for left carotid endarterectomy and CABG on Monday. Dr. Early my partner will perform the carotid endarterectomy. I have discussed this with the patient. I have reviewed the indications for carotid endarterectomy, that is to lower the risk of future stroke. I have also reviewed the potential complications of surgery, including but not limited to: bleeding, stroke (perioperative risk 1-2%), MI, nerve injury of other unpredictable medical problems. All of the patients questions were answered and they are agreeable to proceed with surgery.   Evanthia Maund, MD, FACS Beeper: [phone] 01/19/2012    

## 2012-01-22 NOTE — Progress Notes (Signed)
The patient was examined and preop studies reviewed. There has been no change from the prior exam and the patient is ready for surgery.  Plan CABG for severe CAD, hig risk for MI,CVA.

## 2012-01-22 NOTE — OR Nursing (Signed)
1st procedure ended @ 09:32am.  2nd procedure started @ 09:35am. 13:15pm - Call to vol. Desk to inform family off pump, 1st call to SICU.

## 2012-01-22 NOTE — Progress Notes (Signed)
Pt. Transferred to OR for CEA & CABG surgery. V/S stable. Metoprolol 12.5 mg po given as pre-op meds. E-link notify. Transferred.

## 2012-01-22 NOTE — Anesthesia Procedure Notes (Signed)
Procedure Name: Intubation Date/Time: 01/22/2012 7:47 AM Performed by: Sherie Don Pre-anesthesia Checklist: Patient identified, Emergency Drugs available, Suction available, Patient being monitored and Timeout performed Patient Re-evaluated:Patient Re-evaluated prior to inductionOxygen Delivery Method: Circle system utilized Preoxygenation: Pre-oxygenation with 100% oxygen Intubation Type: IV induction Ventilation: Mask ventilation without difficulty and Oral airway inserted - appropriate to patient size Laryngoscope Size: Mac and 3 Grade View: Grade I Tube size: 8.0 mm Number of attempts: 1 Airway Equipment and Method: Stylet Placement Confirmation: ETT inserted through vocal cords under direct vision,  positive ETCO2 and breath sounds checked- equal and bilateral Secured at: 22 cm Tube secured with: Tape Dental Injury: Teeth and Oropharynx as per pre-operative assessment

## 2012-01-23 ENCOUNTER — Inpatient Hospital Stay (HOSPITAL_COMMUNITY): Payer: Medicare Other

## 2012-01-23 ENCOUNTER — Encounter (HOSPITAL_COMMUNITY): Payer: Self-pay | Admitting: Cardiothoracic Surgery

## 2012-01-23 LAB — PREPARE PLATELET PHERESIS: Unit division: 0

## 2012-01-23 LAB — GLUCOSE, CAPILLARY
Glucose-Capillary: 100 mg/dL — ABNORMAL HIGH (ref 70–99)
Glucose-Capillary: 130 mg/dL — ABNORMAL HIGH (ref 70–99)
Glucose-Capillary: 122 mg/dL — ABNORMAL HIGH (ref 70–99)
Glucose-Capillary: 104 mg/dL — ABNORMAL HIGH (ref 70–99)
Glucose-Capillary: 100 mg/dL — ABNORMAL HIGH (ref 70–99)
Glucose-Capillary: 121 mg/dL — ABNORMAL HIGH (ref 70–99)
Glucose-Capillary: 117 mg/dL — ABNORMAL HIGH (ref 70–99)
Glucose-Capillary: 122 mg/dL — ABNORMAL HIGH (ref 70–99)

## 2012-01-23 LAB — POCT I-STAT 3, ART BLOOD GAS (G3+)
Acid-Base Excess: 1 mmol/L (ref 0.0–2.0)
Acid-base deficit: 1 mmol/L (ref 0.0–2.0)
Bicarbonate: 23 mEq/L (ref 20.0–24.0)
Bicarbonate: 24.6 mEq/L — ABNORMAL HIGH (ref 20.0–24.0)
O2 Saturation: 97 %
O2 Saturation: 97 %
Patient temperature: 37.4
TCO2: 25 mmol/L (ref 0–100)
pCO2 arterial: 35.9 mmHg (ref 35.0–45.0)
pCO2 arterial: 35.3 mmHg (ref 35.0–45.0)
pCO2 arterial: 35.6 mmHg (ref 35.0–45.0)
pH, Arterial: 7.435 (ref 7.350–7.450)
pH, Arterial: 7.415 (ref 7.350–7.450)
pO2, Arterial: 92 mmHg (ref 80.0–100.0)
pO2, Arterial: 112 mmHg — ABNORMAL HIGH (ref 80.0–100.0)
pO2, Arterial: 96 mmHg (ref 80.0–100.0)

## 2012-01-23 LAB — BASIC METABOLIC PANEL
BUN: 14 mg/dL (ref 6–23)
Calcium: 8.1 mg/dL — ABNORMAL LOW (ref 8.4–10.5)
Creatinine, Ser: 0.97 mg/dL (ref 0.50–1.35)
GFR calc Af Amer: >90 mL/min (ref >90)
GFR calc non Af Amer: 82 mL/min — ABNORMAL LOW (ref >90)

## 2012-01-23 LAB — CBC
HCT: 29.8 % — ABNORMAL LOW (ref 39.0–52.0)
HCT: 27.7 % — ABNORMAL LOW (ref 39.0–52.0)
Hemoglobin: 9.7 g/dL — ABNORMAL LOW (ref 13.0–17.0)
MCH: 32.9 pg (ref 26.0–34.0)
MCHC: 35.9 g/dL (ref 30.0–36.0)
MCV: 91.7 fL (ref 78.0–100.0)
MCV: 93.9 fL (ref 78.0–100.0)
RBC: 2.95 MIL/uL — ABNORMAL LOW (ref 4.22–5.81)
RDW: 14.6 % (ref 11.5–15.5)
WBC: 12.7 10*3/uL — ABNORMAL HIGH (ref 4.0–10.5)

## 2012-01-23 LAB — POCT I-STAT, CHEM 8
HCT: 26 % — ABNORMAL LOW (ref 39.0–52.0)
Hemoglobin: 8.8 g/dL — ABNORMAL LOW (ref 13.0–17.0)
Potassium: 5.1 mEq/L (ref 3.5–5.1)
Sodium: 136 mEq/L (ref 135–145)
TCO2: 20 mmol/L (ref 0–100)

## 2012-01-23 LAB — MAGNESIUM: Magnesium: 2.2 mg/dL (ref 1.5–2.5)

## 2012-01-23 LAB — PREPARE FRESH FROZEN PLASMA

## 2012-01-23 LAB — CREATININE, SERUM
GFR calc Af Amer: >90 mL/min (ref >90)
GFR calc non Af Amer: 82 mL/min — ABNORMAL LOW (ref >90)

## 2012-01-23 MED ORDER — ALPRAZOLAM 0.5 MG PO TABS: 1.0000 mg | ORAL_TABLET | Freq: Every evening | ORAL | Status: DC | PRN

## 2012-01-23 MED ORDER — VANCOMYCIN HCL IN DEXTROSE 1-5 GM/200ML-% IV SOLN
1000.0000 mg | Freq: Once | INTRAVENOUS | Status: AC
  Administered 2012-01-23: 1000 mg via INTRAVENOUS
  Filled 2012-01-23: qty 200

## 2012-01-23 MED ORDER — INSULIN GLARGINE 100 UNIT/ML ~~LOC~~ SOLN
10.0000 [IU] | Freq: Every day | SUBCUTANEOUS | Status: DC
  Administered 2012-01-23 – 2012-01-24 (×2): 10 [IU] via SUBCUTANEOUS

## 2012-01-23 MED ORDER — TRAMADOL HCL 50 MG PO TABS
50.0000 mg | ORAL_TABLET | Freq: Four times a day (QID) | ORAL | Status: DC | PRN
  Administered 2012-01-28: 50 mg via ORAL
  Filled 2012-01-23 (×2): qty 1

## 2012-01-23 MED ORDER — INSULIN ASPART 100 UNIT/ML ~~LOC~~ SOLN
0.0000 [IU] | SUBCUTANEOUS | Status: DC
  Administered 2012-01-23: 4 [IU] via SUBCUTANEOUS
  Administered 2012-01-23: 2 [IU] via SUBCUTANEOUS
  Administered 2012-01-23: 4 [IU] via SUBCUTANEOUS
  Administered 2012-01-24: 2 [IU] via SUBCUTANEOUS
  Administered 2012-01-24: 4 [IU] via SUBCUTANEOUS
  Administered 2012-01-24: 2 [IU] via SUBCUTANEOUS

## 2012-01-23 MED FILL — Potassium Chloride Inj 2 mEq/ML: INTRAVENOUS | Qty: 40 | Status: AC

## 2012-01-23 MED FILL — Midazolam HCl Inj 2 MG/2ML (Base Equivalent): INTRAMUSCULAR | Qty: 2 | Status: AC

## 2012-01-23 MED FILL — Fentanyl Citrate Inj 0.05 MG/ML: INTRAMUSCULAR | Qty: 2 | Status: AC

## 2012-01-23 MED FILL — Magnesium Sulfate Inj 50%: INTRAMUSCULAR | Qty: 10 | Status: AC

## 2012-01-23 NOTE — Procedures (Signed)
Extubation Procedure Note  Patient Details:   Name: Charles Hatfield DOB: 06/21/1941 MRN: [id number]   Airway Documentation:  Airway 8 mm (Active)    Evaluation  O2 sats: stable throughout Complications: No apparent complications Patient did tolerate procedure well. Bilateral Breath Sounds: Diminished Suctioning: Airway Yes  Patient weaned from Nitric Oxide and ventilatory support. NIF 42cm and VC 934cc's. Patient extubated and is now wearing nasal cannula, running at 4lpm of O2. Tolerated procedure well.  Clearance Coots 01/23/2012, 10:35 AM

## 2012-01-23 NOTE — Progress Notes (Signed)
Subjective: Interval History: none. Awake on the vent.   Objective: Vital signs in last 24 hours: Temp:  [94.3 F (34.6 C)-99.7 F (37.6 C)] 99.7 F (37.6 C) (07/30 0700) Pulse Rate:  [62-91] 70  (07/30 0700) Resp:  [13-16] 14  (07/30 0700) BP: (88-116)/(42-59) 93/49 mmHg (07/30 0700) SpO2:  [95 %-100 %] 100 % (07/30 0700) Arterial Line BP: (102-138)/(46-57) 122/50 mmHg (07/30 0700) FiO2 (%):  [50 %-100 %] 50 % (07/30 0407) Weight:  [166 lb 10.7 oz (75.6 kg)-168 lb 10.4 oz (76.5 kg)] 168 lb 10.4 oz (76.5 kg) (07/30 0620)  Intake/Output from previous day: 07/29 0701 - 07/30 0700 In: 10200.3 [I.V.:6790.3; Blood:1800; NG/GT:60; IV Piggyback:1550] Out: 6210 [Urine:3625; Blood:1925; Chest Tube:660] Intake/Output this shift:    Neck dressing intact without hematoma. Follows commands with equal grip strength bilaterally. Grossly intact neurologically  Lab Results:  Basename 01/23/12 0428 01/22/12 2115  WBC 14.6* 12.9*  HGB 10.7* 11.5*10.2*  HCT 29.8* 31.3*30.0*  PLT 130* 126*   BMET  Basename 01/23/12 0428 01/22/12 2115 01/22/12 0530  NA 139 141 --  K 4.9 3.2* --  CL 106 104 --  CO2 22 -- 27  GLUCOSE 119* 138* --  BUN 14 12 --  CREATININE 0.97 0.961.10 --  CALCIUM 8.1* -- 9.8    Studies/Results: Ct Abdomen Pelvis Wo Contrast  01/15/2012  *RADIOLOGY REPORT*  Clinical Data: Left flank pain and gross hematuria.  CT ABDOMEN AND PELVIS WITHOUT CONTRAST  Technique:  Multidetector CT imaging of the abdomen and pelvis was performed following the standard protocol without intravenous contrast.  Comparison: Abdominal CT 02/24/2011.  Findings: There is mild atelectasis at both lung bases.  Mild distal esophageal wall thickening appears improved.  There is no significant pleural effusion.  Once again demonstrated is a large staghorn calculus within the left renal pelvis.  This measures up to 2.8 cm in diameter and is similar to the prior study.  This is likely intermittently  obstructing the ureteral pelvic junction.  There is mild caliectasis and perinephric soft tissue stranding.  The left ureter is mildly dilated.  No ureteral calculi are seen.  There is a 4 mm calculus in a left lower pole calix. Renovascular calcifications are present bilaterally.  There is stable scarring in the lower pole of the right kidney.  There are scattered granulomas in the liver and pancreas.  The spleen, gallbladder and adrenal glands appear unremarkable as imaged in the noncontrast state.  A moderate amount of ingested material is present within the stomach.  There is moderate stool throughout the colon.  No bowel wall thickening or surrounding inflammatory change is seen.  The appendix appears normal.  There is diffuse atherosclerosis status post aortoiliac bypass grafting.  The bladder has a stable appearance.  There are stable calcifications within the prostate gland.  There are stable pelvic phleboliths.  There is trace free pelvic fluid without focal fluid collection or inflammatory process.  Small midline suprapubic and left inguinal hernias containing only fat are unchanged.  There are no acute or suspicious osseous findings.  IMPRESSION:  1.  Large staghorn left renal pelvic calculus with probable chronic intermittent obstruction of the UPJ.  This is similar to prior CT from 2012. 2.  No evidence of ureteral or bladder calculus. 3.  Similar appearance of the abdominal aorta status post aortoiliac bypass grafting. Current study was performed without contrast and does not address graft patency.  The left iliac limb was shown to be occluded previously.  Original  Report Authenticated By: Gerrianne Scale, M.D.   Dg Chest 2 View  01/13/2012  *RADIOLOGY REPORT*  Clinical Data: Chest pain today.  Hypertension, diabetes.  History of smoking, stroke.  CHEST - 2 VIEW  Comparison: 02/24/2011  Findings: Heart is mildly enlarged.  There is increased interstitial prominence, most consistent with  interstitial edema. No focal consolidations or pleural effusions are identified. Degenerative changes are seen in the spine.  IMPRESSION: Cardiomegaly and interstitial pulmonary edema.  Original Report Authenticated By: Patterson Hammersmith, M.D.   Ct Angio Neck W/cm &/or Wo/cm  01/19/2012  *RADIOLOGY REPORT*  Clinical Data:  Left carotid stenosis  CT ANGIOGRAPHY NECK  Technique:  Multidetector CT imaging of the neck was performed using the standard protocol during bolus administration of intravenous contrast.  Multiplanar CT image reconstructions including MIPs were obtained to evaluate the vascular anatomy. Carotid stenosis measurements (when applicable) are obtained utilizing NASCET criteria, using the distal internal carotid diameter as the denominator.  Contrast: 50mL OMNIPAQUE IOHEXOL 350 MG/ML SOLN  Comparison:  CT chest 01/17/2012  Findings:  Mild patchy densities in the upper lobes bilaterally have progressed and may be due to infection or edema in the lungs. Small pleural effusions bilaterally.  Moderate to advanced cervical spondylosis.  Large posterior osteophyte at C3-4 causing spinal stenosis.  Large anterior osteophytes are present throughout the cervical spine.  No fracture or acute bony abnormality.  No mass or adenopathy is present in the neck.  Severe diffuse atherosclerotic disease with atherosclerotic plaque and calcification of the aortic arch and cervical vessels.  Left carotid artery   origin from the innominate artery.  There is extensive atherosclerotic disease in the left subclavian artery with approximately 60% diameter stenosis.  Right carotid:  Calcified atherosclerotic plaque in the proximal common carotid artery and noncalcified plaque in the distal common carotid artery.  Heavily calcified plaque at the carotid bifurcation.  The lumen is narrowed to 1 mm diameter stenosis, best seen on the sagittal images.  This corresponds to 83% diameter stenosis.  60% diameter stenosis of the  right external carotid artery at the origin.  Negative for carotid dissection.  The remainder the internal carotid artery is patent to the skull base.  Left carotid:  Left common carotid artery has mild plaque in the midportion without significant stenosis.  Heavily calcified plaque of the right carotid bifurcation extending into the internal carotid artery.  This narrows the lumen to 1.0 mm in diameter. However, this appears  to be a higher grade stenosis than the right carotid.  There is approximately 83% diameter stenosis of the left internal carotid artery.  There is severe stenosis at the origin of the left external carotid artery.  Vertebral arteries:  Right vertebral artery is a small vessel and is diffusely diseased.  There is a moderate stenosis proximally, and diffuse disease throughout the vertebral artery which occludes at approximately the C1 level.  There is a 60% stenosis at the origin of the left vertebral artery.  Left vertebral artery has diffuse atherosclerotic disease.   There is a calcified plaque at the foramen magnum causing moderate stenosis.   Review of the MIP images confirms the above findings.  IMPRESSION: Severe diffuse atherosclerotic disease, including the aortic arch and the left subclavian artery.  Approximately 83% diameter stenosis of the proximal right internal carotid artery.  Approximately 83% diameter stenosis of the proximal left internal carotid artery.  Severe stenosis of the left external carotid artery.  Small right vertebral artery which  is diffusely diseased and occludes at the C1 level.  There is also atherosclerotic disease in the left vertebral artery with multiple areas of stenosis.  Mild patchy airspace disease bilaterally may represent pneumonia or edema, this shows mild progression from the CT of 01/17/2012  Original Report Authenticated By: Camelia Phenes, M.D.   Ct Angio Chest W/cm &/or Wo Cm  01/17/2012  *RADIOLOGY REPORT*  Clinical Data: Severe COPD.   Aortic disease.  Possible heart surgery.  CT ANGIOGRAPHY CHEST  Technique:  Multidetector CT imaging of the chest using the standard protocol during bolus administration of intravenous contrast. Multiplanar reconstructed images including MIPs were obtained and reviewed to evaluate the vascular anatomy.  Contrast: OMNIPAQUE IOHEXOL 350 MG/ML SOLN  Comparison: No priors.  Findings:  Mediastinum: Heart size is mildly enlarged with probable mild left ventricular dilatation.  Notably, the apex of the left ventricle appears mildly thinned and slightly rounded in appearance, and there is a 2 cm filling defect in the apex, favored to represent left ventricular apical thrombus. There is atherosclerosis of the thoracic aorta, the great vessels of the mediastinum and the coronary arteries, including calcified atherosclerotic plaque in the left main, left anterior descending, left circumflex and right coronary arteries. Thickening and calcification of the aortic valve. There is no significant pericardial fluid, thickening or pericardial calcification. No pathologically enlarged mediastinal or hilar lymph nodes.   .  No acute abnormality of the thoracic aorta; specifically, no aneurysm or dissection.  Extensive atherosclerotic plaque is also noted within the great vessels of the mediastinum, and notably, there appears to be a high-grade stenosis at the ostium of the left vertebral artery (likely greater than 70% stenosis).  Importantly, the right vertebral artery is very diminutive and the left vertebral artery appears to be dominant.  Lungs/Pleura: There is a background of very mild patchy ground- glass attenuation and interlobular septal thickening in the lungs bilaterally, favored to represent mild interstitial pulmonary edema.  Associated with this there is some mild diffuse thickening of the peribronchovascular interstitium.  Trace bilateral pleural effusions (right greater than left).  There are a few scattered  calcified pulmonary nodules compatible with calcified granulomas. No other larger suspicious appearing pulmonary nodules or masses are otherwise identified.  Upper Abdomen: Subcentimeter low-attenuation lesion in the upper pole of the right kidney. The large 1.3 x 2.8 cm stone in the left renal collecting system.  The left renal collecting system demonstrates some mild urothelial enhancement, likely chronic related to the indwelling stone.  Numerous tiny calcifications throughout the liver likely represent small calcified granulomas. Extensive atherosclerosis of the visualized abdominal vasculature.  Musculoskeletal: There are no aggressive appearing lytic or blastic lesions noted in the visualized portions of the skeleton.  IMPRESSION: 1.  Left ventricular dilatation with a thinned out and slightly rounded appearance of the apical wall segments (suggesting ischemia and/or scarring from prior myocardial infarction).  Importantly, there is a 2 cm filling defect in the apex of the left ventricle, most likely to represent thrombus. 2.  In addition, there is extensive atherosclerosis of the aorta and arch vessels, including a high-grade (likely greater than 70% stenosis) lesion at the ostium of the left vertebral artery. Notably, the patient is left vertebral artery dominant with a very diminutive right vertebral artery. 3. Atherosclerosis, including left main and three-vessel coronary artery disease. 4.  The appearance of the lungs suggests mild interstitial pulmonary edema.  In addition, there are trace bilateral pleural effusions layering dependently. 5.  Large 1.3  x 2.8 cm calculus in the left renal collecting system. 6.  Additional incidental findings, as above.  These results were called by telephone on 01/17/2012 at 02:22 p.m. to Dr. Donata Clay, who verbally acknowledged these results.  Original Report Authenticated By: Florencia Reasons, M.D.   Dg Chest Portable 1 View  01/22/2012  *RADIOLOGY REPORT*   Clinical Data: Postop.  PORTABLE CHEST - 1 VIEW  Comparison: 01/13/2012.  Findings: Interval post CABG changes.  Enlarged cardiac silhouette with mild improvement.  Prominent interstitial markings without significant change.  No pleural fluid.  Endotracheal tube in satisfactory position.  Mediastinal and bilateral chest tubes without pneumothorax.  Right jugular Swan- Ganz catheter tip in the proximal right main pulmonary artery. Nasogastric tube extending into the stomach.  IMPRESSION: Status post CABG without acute abnormality.  Stable chronic interstitial lung disease.  Original Report Authenticated By: [name], M.D.   Anti-infectives: Anti-infectives     Start     Dose/Rate Route Frequency Ordered Stop   01/22/12 2200   cefUROXime (ZINACEF) 1.5 g in dextrose 5 % 50 mL IVPB        1.5 g 100 mL/hr over 30 Minutes Intravenous Every 12 hours 01/22/12 1545 01/24/12 2159   01/22/12 2015   vancomycin (VANCOCIN) IVPB 1000 mg/200 mL premix        1,000 mg 200 mL/hr over 60 Minutes Intravenous  Once 01/22/12 1546 01/22/12 2030   01/22/12 0400   vancomycin (VANCOCIN) 1,250 mg in sodium chloride 0.9 % 250 mL IVPB        1,250 mg 166.7 mL/hr over 90 Minutes Intravenous To Surgery 01/21/12 1518 01/22/12 0720   01/22/12 0400   cefUROXime (ZINACEF) 1.5 g in dextrose 5 % 50 mL IVPB        1.5 g 100 mL/hr over 30 Minutes Intravenous To Surgery 01/21/12 1518 01/22/12 1300   01/22/12 0400   cefUROXime (ZINACEF) 750 mg in dextrose 5 % 50 mL IVPB  Status:  Discontinued        750 mg 100 mL/hr over 30 Minutes Intravenous To Surgery 01/21/12 1505 01/22/12 1546          Assessment/Plan: s/p Procedure(s) (LRB): CORONARY ARTERY BYPASS GRAFTING (CABG) (N/A) ENDARTERECTOMY CAROTID (Left) Stable postop day #1 from left carotid endarterectomy and coronary bypass grafting. Extubation and plan per cardiac surgical service   LOS: 10 days   Charles Hatfield 01/23/2012, 7:23 AM

## 2012-01-23 NOTE — Anesthesia Postprocedure Evaluation (Signed)
  Anesthesia Post-op Note  Patient: Charles Hatfield  Procedure(s) Performed: Procedure(s) (LRB): CORONARY ARTERY BYPASS GRAFTING (CABG) (N/A) ENDARTERECTOMY CAROTID (Left)  Patient Location: SICU  Anesthesia Type: General  Level of Consciousness: awake, alert  and oriented  Airway and Oxygen Therapy: Patient Spontanous Breathing and Patient connected to nasal cannula oxygen  Post-op Pain: none  Post-op Assessment: Post-op Vital signs reviewed, Patient's Cardiovascular Status Stable, Respiratory Function Stable, Patent Airway, No signs of Nausea or vomiting, Adequate PO intake and Pain level controlled  Post-op Vital Signs: Reviewed and stable  Complications: No apparent anesthesia complications

## 2012-01-23 NOTE — Significant Event (Signed)
Removed femoral A-line and venous line per order. Held pressure for 15 minutes; noted no developments of hematoma or bleeding and dressed site. Returned in three minutes to check on site and felt a hematoma of approximately 2 inch X 2 inch; no bruising noted or active bleeding. Applied further pressure. Medical provider was notified. Sandbag placed at site and patient bedrest with HOB flat. VS stable. Pt denied SOB or pain at the time. Pedal pulses checked Q41minutes and has been at baseline. Pt being monitored closely. Armany Mano, Charity fundraiser.

## 2012-01-23 NOTE — Op Note (Signed)
Charles Hatfield, PINO NO.:  [id number]  MEDICAL RECORD NO.:  [id number]  LOCATION:  2314                         FACILITY:  [facility]  PHYSICIAN:  Kerin Perna, M.D.  DATE OF BIRTH:  1940/08/07  DATE OF PROCEDURE: DATE OF DISCHARGE:                              OPERATIVE REPORT   OPERATION: 1. Coronary artery bypass grafting x3 (left internal mammary artery to     left anterior descending artery, saphenous vein graft to second     diagonal, saphenous vein graft to distal right coronary artery     posterolateral branch). 2. Endoscopic harvest of right leg greater saphenous vein. 3. Placement of right femoral A-line for blood pressure monitoring.  PREOPERATIVE DIAGNOSES:  Unstable angina, left ventricular dysfunction, severe three-vessel coronary artery disease.  POSTOPERATIVE DIAGNOSES:  Unstable angina, left ventricular dysfunction, severe three-vessel coronary artery disease.  SURGEON:  Kerin Perna, MD  ASSISTANT:  Lowella Dandy, PA-C  ANESTHESIA:  General by Dr. Sharee Holster.  INDICATIONS:  The patient is a 71 year old Caucasian male smoker with history of stroke and right-sided weakness, COPD, severe two-vessel coronary artery disease with LV dysfunction and unstable angina.  He was not felt to be a candidate for percutaneous intervention and high risk coronary artery bypass grafting was recommended by his cardiologist.  I reviewed the situation with the patient and his family on several occasions and discussed the results of the cardiac cath, 2D echo, pulmonary function testing, and his overall condition.  They understood that the preoperative studies demonstrated a critical left carotid stenosis and a combined carotid endarterectomy and CABG was planned.  I discussed the major aspects of the CABG operation including the location of the surgical incisions, use of general anesthesia and cardiopulmonary bypass, and the expected postoperative  hospital recovery.  The patient and family understood that he was at increased risk for this operation due to his previous stroke, and his LV dysfunction, diffuse coronary artery disease with suboptimal targets, and the urgent presentation.  He understood that he was at risk for another stroke, MI, bleeding, blood transfusion requirement, ventilator dependence, tracheostomy, serious infection, and/or death.  He demonstrated his understanding, agreed to proceed with the surgery under what I felt was an informed consent.  OPERATIVE FINDINGS: 1. Severe calcified diffuse coronary artery disease. 2. Good-quality conduit. 3. Intraoperative hypoxemia after closing the chest requiring opening     the chest incision and all grafts were checked and found to be     intact. 4. Intraoperative thrombocytopenia with platelet count of 70,000,     requiring platelet transfusion.  PROCEDURE:  The patient was brought to the operating room and placed supine on the operating table where general anesthesia was induced.  Dr. Arbie Cookey performed a left carotid endarterectomy, which will be dictated in a separate note.  The neck incision was closed with a series of interrupted nylon sutures.  Next, a second time-out was performed.  The chest, abdomen and legs had been prepped and draped as a sterile field.  A sternal incision was made as the saphenous vein was harvested endoscopically from the right leg. The left internal mammary artery was harvested as a pedicle graft.  The vein was harvested endoscopically.  The sternal retractor was placed and the pericardium was suspended.  Pursestrings were placed in the ascending aorta and right atrium, and heparin was administered.  The ACT was documented as being therapeutic.  The patient was cannulated and placed on the cardiopulmonary bypass.  There were old adhesions in the pericardium from prior MI.  These were both anterior and inferior. Coronary arteries were  identified for grafting.  There was no circumflex vessel adequate for grafting.  Cardioplegia catheters were placed for both antegrade and retrograde cold blood cardioplegia, and the patient was cooled to 32 degrees.  The mammary artery and vein grafts were prepared for the distal anastomosis and the crossclamp was then applied. One liter of cold blood cardioplegia was delivered in split doses between the antegrade aortic and retrograde coronary sinus catheters. There was good cardioplegic arrest.  Septal temperature dropped less than 14 degrees.  Cardioplegia was delivered every 20 minutes or less.  The distal coronary anastomoses were performed.  The first distal anastomosis was to the posterolateral branch of the right coronary artery.  This was a 1.5-mm vessel, totally occluded proximally.  A reverse saphenous vein was sewn end-to-side with running 7-0 Prolene with good flow through the graft.  The second distal anastomosis was to the diagonal branch of to LAD.  This was a 1.5-mm vessel, proximal 80% stenosis.  A reverse saphenous vein was sewn end-to-side with running 7- 0 Prolene with good flow through the graft.  The third distal anastomosis was to the distal LAD.  It was diffusely diseased.  This was a 1.5-mm vessel.  The probe passed proximally and distally.  The left IMA pedicle was brought through an opening and the left lateral pericardium was brought down onto the LAD and sewn end-to-side with running 8-0 Prolene.  There was excellent flow through the anastomosis after frequently releasing the pedicle bulldog on the mammary artery. The bulldog was reapplied and the pedicle was secured to the epicardium with 6-0 Prolene.  The crossclamp was stayed in place while the two proximal vein anastomoses were performed using a 4.0-mm punch with running 7-0 Prolene.  Prior to tying down the final proximal anastomosis, air was vented from the coronary arteries with a dose of retrograde  warm blood cardioplegia.  The crossclamp was then removed.  The heart was cardioverted back to a regular rhythm.  The grafts were de- aired with a 27-gauge needle and were opened and each had good flow and hemostasis was documented at the proximal and distal sites.  Temporary pacing wires were applied, and the patient was rewarmed.  The ventilator was resumed and the lungs were expanded.  The patient was adequately reperfused and rewarmed.  He was weaned off bypass with low-dose dopamine and milrinone.  The echo showed improved global LV function. Protamine was administered without adverse reaction.  The cannula was removed.  The mediastinum was irrigated with warm saline.  The platelet count returned at 70,000.  The patient was given 1 unit of platelets with improved coagulation function.  The superior pericardial fat was closed over the aorta.  The anterior mediastinal and bilateral pleural tubes were placed and brought out through separate incisions.  The sternum was closed with wire.  The patient remained hemodynamically stable.  We then started to close the left neck incision.  At this point, the patient had two problems present to me.  First was blood pressure by A-line in the 40s, but by cuff, it read  90/50.  Second, his O2 saturation dropped to 80%.  We checked a stat blood gas and his pO2 was in the 40s.  At this point, I opened the chest back up and determined that the aortic pressure was greater than 100.  I then placed a right femoral A-line for blood pressure monitoring as the radial A-line was dampened.  We also worked for the anesthesiologist to improve his oxygenation with airway irrigation, recruitment with extra volume, nebulized therapy in a dose of IV steroids and epinephrine in case there is allergic component. Slowly, the oxygen saturation improved and before leaving the room, his pO2 was over 60.  After rechecking the vein grafts and using a sterile Doppler  probe, did document they had good flow.  The superior pericardial fat was again closed.  The sternum was closed with a new set of wires.  The pectoralis fascia was closed with running #1 Vicryl.  The subcutaneous and skin layers were closed with running Vicryl.  The left neck was closed with interrupted 3-0 Vicryl for the fascia and a running 2-0 Vicryl for the subcutaneous.  The skin was closed with subcuticular.  The patient then returned to the ICU in critical, but stable condition.  Total cardiopulmonary bypass time was 150 minutes.     Kerin Perna, M.D.     PV/MEDQ  D:  01/22/2012  T:  01/23/2012  Job:  [id number]  cc:   Thurmon Fair, MD

## 2012-01-23 NOTE — Significant Event (Signed)
1800pm-spoken with MD Donata Clay to clarify and update chest tube output as patient has order to d/c right pleural. Verbal order to hold discontinuation of right pleural and re-evaluate tomorrow. Pt and son updated. Will monitor. Charles Hatfield, Charity fundraiser.

## 2012-01-23 NOTE — Progress Notes (Signed)
1 Day Post-Op Procedure(s) (LRB): CORONARY ARTERY BYPASS GRAFTING (CABG) (N/A) ENDARTERECTOMY CAROTID (Left) Subjective:                      [address]            [place] [address]          [phone]     CABGx3, L carotid end POD # 1 Severe COPD, LV dysfunction, DM , hx R body CVA Objective: Vital signs in last 24 hours: Temp:  [94.3 F (34.6 C)-99.9 F (37.7 C)] 99.1 F (37.3 C) (07/30 1000) Pulse Rate:  [62-91] 83  (07/30 1000) Cardiac Rhythm:  [-] Atrial paced (07/30 0745) Resp:  [13-23] 21  (07/30 1000) BP: (85-119)/(42-63) 103/63 mmHg (07/30 1000) SpO2:  [95 %-100 %] 99 % (07/30 1000) Arterial Line BP: (102-138)/(44-57) 124/52 mmHg (07/30 1000) FiO2 (%):  [40 %-100 %] 40 % (07/30 0816) Weight:  [166 lb 10.7 oz (75.6 kg)-168 lb 10.4 oz (76.5 kg)] 168 lb 10.4 oz (76.5 kg) (07/30 0620)  Hemodynamic parameters for last 24 hours: PAP: (19-29)/(7-14) 28/14 mmHg CO:  [3.6 L/min-5 L/min] 4.4 L/min CI:  [2.1 L/min/m2-2.9 L/min/m2] 2.5 L/min/m2  Intake/Output from previous day: 07/29 0701 - 07/30 0700 In: 91478 [I.V.:6794; Blood:1800; NG/GT:60; IV Piggyback:1550] Out: 6210 [Urine:3625; Blood:1925; Chest Tube:660] Intake/Output this shift: Total I/O In: 223.7 [I.V.:193.7; NG/GT:30] Out: 540 [Urine:270; Emesis/NG output:200; Chest Tube:70]  EXAM Lungs clear  Neuro baseline extrem warm  Lab Results:  [facility] 01/23/12 0428 01/22/12 2115  WBC 14.6* 12.9*  HGB 10.7* 11.5*10.2*  HCT 29.8* 31.3*30.0*  PLT 130* 126*   BMET:  Basename 01/23/12 0428 01/22/12 2115 01/22/12 0530  NA 139 141 --  K 4.9 3.2* --  CL 106 104 --  CO2 22 -- 27  GLUCOSE 119* 138* --  BUN 14 12 --  CREATININE 0.97 0.961.10 --  CALCIUM 8.1* -- 9.8    PT/INR:  Basename 01/22/12 1600  LABPROT 19.7*  INR 1.64*   ABG    Component Value Date/Time   PHART 7.435 01/23/2012 0929   HCO3 24.6* 01/23/2012 0929   TCO2 26 01/23/2012 0929   ACIDBASEDEF 1.0 01/23/2012 0424   O2SAT 98.0 01/23/2012 0929   CBG (last 3)   Basename 01/23/12 0804 01/23/12 0705 01/23/12 0602  GLUCAP 122* 117* 134*    Assessment/Plan: S/P Procedure(s) (LRB): CORONARY ARTERY BYPASS GRAFTING (CABG) (N/A) ENDARTERECTOMY CAROTID (Left) See progression orders extubate   LOS: 10 days    VAN TRIGT III,Izzabell Klasen 01/23/2012

## 2012-01-23 NOTE — Plan of Care (Signed)
Problem: Discharge Progression Outcomes Goal: Barriers To Progression Addressed/Resolved Outcome: Completed/Met Date Met:  01/23/12 OOB to standing on side of bed and into chair completed after resolution of bedrest from D/C of right femoral a-line .

## 2012-01-23 NOTE — Plan of Care (Signed)
Problem: Consults Goal: Cardiac Surgery Patient Education ( See Patient Education module for education specifics.)  Outcome: Completed/Met Date Met:  01/23/12 Per MD notes  Problem: Phase II Progression Outcomes Goal: Tolerates D/C of vasopressors Outcome: Progressing Levophed off, low dose Dopamine @ 3 mcg/kg/min Goal: Tolerates liquids without nausea/vomiting Outcome: Not Progressing Patient NPO x ice for SLP swallow eval 7/31 Goal: Patient extubated within - Outcome: Completed/Met Date Met:  01/23/12 > 12 h

## 2012-01-23 NOTE — Progress Notes (Signed)
SICU p.m. Rounds  Patient reviewed and examined at bedside Just got back in bed from chair good overall strength with assistance Urine output excellent P.m. labs reviewed Atrially paced Continue renal dose dopamine for LV dysfunction, severe

## 2012-01-23 NOTE — Plan of Care (Signed)
Problem: Phase II Progression Outcomes Goal: Dangles within 2 hrs after extubation Outcome: Completed/Met Date Met:  01/23/12 Not met as pt still had femoral line

## 2012-01-23 NOTE — Progress Notes (Signed)
Subjective:  Just back from OR, extubated 1 hour ago. Awake, alert and conversant  Objective:  Temp:  [94.3 F (34.6 C)-99.9 F (37.7 C)] 99.1 F (37.3 C) (07/30 1000) Pulse Rate:  [62-91] 83  (07/30 1000) Resp:  [13-23] 21  (07/30 1000) BP: (85-119)/(42-63) 103/63 mmHg (07/30 1000) SpO2:  [95 %-100 %] 99 % (07/30 1000) Arterial Line BP: (102-138)/(44-57) 124/52 mmHg (07/30 1000) FiO2 (%):  [40 %-100 %] 40 % (07/30 0816) Weight:  [75.6 kg (166 lb 10.7 oz)-76.5 kg (168 lb 10.4 oz)] 76.5 kg (168 lb 10.4 oz) (07/30 1610) Weight change:   Intake/Output from previous day: 07/29 0701 - 07/30 0700 In: 96045 [I.V.:6794; Blood:1800; NG/GT:60; IV Piggyback:1550] Out: 6210 [Urine:3625; Blood:1925; Chest Tube:660]  Intake/Output from this shift: Total I/O In: 223.7 [I.V.:193.7; NG/GT:30] Out: 540 [Urine:270; Emesis/NG output:200; Chest Tube:70]  Physical Exam: General appearance: alert, cooperative, appears stated age and no distress Neck: no adenopathy, no carotid bruit, no JVD, supple, symmetrical, trachea midline and thyroid not enlarged, symmetric, no tenderness/mass/nodules Lungs: clear to auscultation bilaterally Heart: regular rate and rhythm, S1, S2 normal, no murmur, click, rub or gallop Extremities: extremities normal, atraumatic, no cyanosis or edema  Lab Results: Results for orders placed during the hospital encounter of 01/13/12 (from the past 48 hour(s))  HEPARIN LEVEL (UNFRACTIONATED)     Status: Normal   Collection Time   01/21/12  1:42 PM      Component Value Range Comment   Heparin Unfractionated 0.30  0.30 - 0.70 IU/mL   PREPARE RBC (CROSSMATCH)     Status: Normal   Collection Time   01/21/12  7:20 PM      Component Value Range Comment   Order Confirmation ORDER PROCESSED BY BLOOD BANK     CBC     Status: Abnormal   Collection Time   01/22/12  5:30 AM      Component Value Range Comment   WBC 6.0  4.0 - 10.5 K/uL    RBC 4.37  4.22 - 5.81 MIL/uL    Hemoglobin  14.6  13.0 - 17.0 g/dL    HCT 40.9  81.1 - 91.4 %    MCV 96.6  78.0 - 100.0 fL    MCH 33.4  26.0 - 34.0 pg    MCHC 34.6  30.0 - 36.0 g/dL    RDW 78.2  95.6 - 21.3 %    Platelets 116 (*) 150 - 400 K/uL CONSISTENT WITH PREVIOUS RESULT  HEPARIN LEVEL (UNFRACTIONATED)     Status: Normal   Collection Time   01/22/12  5:30 AM      Component Value Range Comment   Heparin Unfractionated 0.55  0.30 - 0.70 IU/mL   BASIC METABOLIC PANEL     Status: Abnormal   Collection Time   01/22/12  5:30 AM      Component Value Range Comment   Sodium 141  135 - 145 mEq/L    Potassium 4.8  3.5 - 5.1 mEq/L    Chloride 103  96 - 112 mEq/L    CO2 27  19 - 32 mEq/L    Glucose, Bld 119 (*) 70 - 99 mg/dL    BUN 17  6 - 23 mg/dL    Creatinine, Ser 0.86  0.50 - 1.35 mg/dL    Calcium 9.8  8.4 - 57.8 mg/dL    GFR calc non Af Amer 62 (*) >90 mL/min    GFR calc Af Amer 72 (*) >90 mL/min   TYPE  AND SCREEN     Status: Normal (Preliminary result)   Collection Time   01/22/12  5:55 AM      Component Value Range Comment   ABO/RH(D) O POS      Antibody Screen NEG      Sample Expiration 01/25/2012      Unit Number [id number]      Blood Component Type RED CELLS,LR      Unit division 00      Status of Unit ISSUED,FINAL      Transfusion Status OK TO TRANSFUSE      Crossmatch Result Compatible      Unit Number [id number]      Blood Component Type RED CELLS,LR      Unit division 00      Status of Unit ALLOCATED      Transfusion Status OK TO TRANSFUSE      Crossmatch Result Compatible     POCT I-STAT 4, (NA,K, GLUC, HGB,HCT)     Status: Abnormal   Collection Time   01/22/12  8:04 AM      Component Value Range Comment   Sodium 141  135 - 145 mEq/L    Potassium 4.5  3.5 - 5.1 mEq/L    Glucose, Bld 120 (*) 70 - 99 mg/dL    HCT 78.2 (*) 95.6 - 52.0 %    Hemoglobin 11.6 (*) 13.0 - 17.0 g/dL   POCT I-STAT GLUCOSE     Status: Abnormal   Collection Time   01/22/12  9:23 AM      Component Value Range Comment   Operator id  [id number]      Glucose, Bld 118 (*) 70 - 99 mg/dL   POCT I-STAT GLUCOSE     Status: Abnormal   Collection Time   01/22/12 10:22 AM      Component Value Range Comment   Operator id [id number]      Glucose, Bld 137 (*) 70 - 99 mg/dL   POCT I-STAT 4, (NA,K, GLUC, HGB,HCT)     Status: Abnormal   Collection Time   01/22/12 10:48 AM      Component Value Range Comment   Sodium 141  135 - 145 mEq/L    Potassium 4.3  3.5 - 5.1 mEq/L    Glucose, Bld 139 (*) 70 - 99 mg/dL    HCT 21.3 (*) 08.6 - 52.0 %    Hemoglobin 10.9 (*) 13.0 - 17.0 g/dL   POCT I-STAT 4, (NA,K, GLUC, HGB,HCT)     Status: Abnormal   Collection Time   01/22/12 11:08 AM      Component Value Range Comment   Sodium 138  135 - 145 mEq/L    Potassium 4.0  3.5 - 5.1 mEq/L    Glucose, Bld 116 (*) 70 - 99 mg/dL    HCT 57.8 (*) 46.9 - 52.0 %    Hemoglobin 7.8 (*) 13.0 - 17.0 g/dL   POCT I-STAT 3, BLOOD GAS (G3+)     Status: Abnormal   Collection Time   01/22/12 11:15 AM      Component Value Range Comment   pH, Arterial 7.352  7.350 - 7.450    pCO2 arterial 41.1  35.0 - 45.0 mmHg    pO2, Arterial 264.0 (*) 80.0 - 100.0 mmHg    Bicarbonate 22.8  20.0 - 24.0 mEq/L    TCO2 24  0 - 100 mmol/L    O2 Saturation 100.0      Acid-base deficit 3.0 (*) 0.0 -  2.0 mmol/L    Sample type ARTERIAL     HEMOGLOBIN AND HEMATOCRIT, BLOOD     Status: Abnormal   Collection Time   01/22/12 12:08 PM      Component Value Range Comment   Hemoglobin 8.6 (*) 13.0 - 17.0 g/dL DELTA CHECK NOTED   HCT 24.2 (*) 39.0 - 52.0 %   PLATELET COUNT     Status: Abnormal   Collection Time   01/22/12 12:08 PM      Component Value Range Comment   Platelets 71 (*) 150 - 400 K/uL   POCT I-STAT 4, (NA,K, GLUC, HGB,HCT)     Status: Abnormal   Collection Time   01/22/12 12:09 PM      Component Value Range Comment   Sodium 138  135 - 145 mEq/L    Potassium 5.4 (*) 3.5 - 5.1 mEq/L    Glucose, Bld 123 (*) 70 - 99 mg/dL    HCT 16.1 (*) 09.6 - 52.0 %    Hemoglobin 8.5 (*) 13.0 -  17.0 g/dL   POCT I-STAT 3, BLOOD GAS (G3+)     Status: Abnormal   Collection Time   01/22/12 12:13 PM      Component Value Range Comment   pH, Arterial 7.462 (*) 7.350 - 7.450    pCO2 arterial 31.9 (*) 35.0 - 45.0 mmHg    pO2, Arterial 246.0 (*) 80.0 - 100.0 mmHg    Bicarbonate 22.8  20.0 - 24.0 mEq/L    TCO2 24  0 - 100 mmol/L    O2 Saturation 100.0      Acid-base deficit 1.0  0.0 - 2.0 mmol/L    Sample type ARTERIAL     PREPARE PLATELET PHERESIS     Status: Normal   Collection Time   01/22/12 12:25 PM      Component Value Range Comment   Unit Number [id number]      Blood Component Type PLTPHER LR1      Unit division 00      Status of Unit ISSUED,FINAL      Transfusion Status OK TO TRANSFUSE     PREPARE FRESH FROZEN PLASMA     Status: Normal   Collection Time   01/22/12 12:59 PM      Component Value Range Comment   Unit Number [id number]      Blood Component Type THAWED PLASMA      Unit division 00      Status of Unit ISSUED,FINAL      Transfusion Status OK TO TRANSFUSE     POCT I-STAT 4, (NA,K, GLUC, HGB,HCT)     Status: Abnormal   Collection Time   01/22/12  1:06 PM      Component Value Range Comment   Sodium 135  135 - 145 mEq/L    Potassium 4.9  3.5 - 5.1 mEq/L    Glucose, Bld 154 (*) 70 - 99 mg/dL    HCT 21.3 (*) 08.6 - 52.0 %    Hemoglobin 6.8 (*) 13.0 - 17.0 g/dL   POCT I-STAT 3, BLOOD GAS (G3+)     Status: Abnormal   Collection Time   01/22/12  1:10 PM      Component Value Range Comment   pH, Arterial 7.348 (*) 7.350 - 7.450    pCO2 arterial 39.4  35.0 - 45.0 mmHg    pO2, Arterial 88.0  80.0 - 100.0 mmHg    Bicarbonate 21.6  20.0 - 24.0 mEq/L    TCO2 23  0 - 100 mmol/L    O2 Saturation 96.0      Acid-base deficit 4.0 (*) 0.0 - 2.0 mmol/L    Sample type ARTERIAL     POCT I-STAT 4, (NA,K, GLUC, HGB,HCT)     Status: Abnormal   Collection Time   01/22/12  1:49 PM      Component Value Range Comment   Sodium 139  135 - 145 mEq/L    Potassium 4.6  3.5 - 5.1 mEq/L      Glucose, Bld 168 (*) 70 - 99 mg/dL    HCT 16.1 (*) 09.6 - 52.0 %    Hemoglobin 7.1 (*) 13.0 - 17.0 g/dL   POCT I-STAT 3, BLOOD GAS (G3+)     Status: Abnormal   Collection Time   01/22/12  1:52 PM      Component Value Range Comment   pH, Arterial 7.290 (*) 7.350 - 7.450    pCO2 arterial 44.1  35.0 - 45.0 mmHg    pO2, Arterial 41.0 (*) 80.0 - 100.0 mmHg    Bicarbonate 21.2  20.0 - 24.0 mEq/L    TCO2 23  0 - 100 mmol/L    O2 Saturation 70.0      Acid-base deficit 5.0 (*) 0.0 - 2.0 mmol/L    Sample type ARTERIAL     POCT I-STAT 3, BLOOD GAS (G3+)     Status: Abnormal   Collection Time   01/22/12  2:07 PM      Component Value Range Comment   pH, Arterial 7.268 (*) 7.350 - 7.450    pCO2 arterial 52.5 (*) 35.0 - 45.0 mmHg    pO2, Arterial 57.0 (*) 80.0 - 100.0 mmHg    Bicarbonate 24.0  20.0 - 24.0 mEq/L    TCO2 26  0 - 100 mmol/L    O2 Saturation 84.0      Acid-base deficit 3.0 (*) 0.0 - 2.0 mmol/L    Sample type ARTERIAL     POCT I-STAT 4, (NA,K, GLUC, HGB,HCT)     Status: Abnormal   Collection Time   01/22/12  2:10 PM      Component Value Range Comment   Sodium 140  135 - 145 mEq/L    Potassium 4.3  3.5 - 5.1 mEq/L    Glucose, Bld 213 (*) 70 - 99 mg/dL    HCT 04.5 (*) 40.9 - 52.0 %    Hemoglobin 9.9 (*) 13.0 - 17.0 g/dL   POCT I-STAT 3, BLOOD GAS (G3+)     Status: Abnormal   Collection Time   01/22/12  2:44 PM      Component Value Range Comment   pH, Arterial 7.338 (*) 7.350 - 7.450    pCO2 arterial 44.4  35.0 - 45.0 mmHg    pO2, Arterial 98.0  80.0 - 100.0 mmHg    Bicarbonate 23.8  20.0 - 24.0 mEq/L    TCO2 25  0 - 100 mmol/L    O2 Saturation 97.0      Acid-base deficit 2.0  0.0 - 2.0 mmol/L    Sample type ARTERIAL     CARBOXYHEMOGLOBIN     Status: Abnormal   Collection Time   01/22/12  3:55 PM      Component Value Range Comment   Total hemoglobin 11.2 (*) 13.5 - 18.0 g/dL    O2 Saturation 81.1      Carboxyhemoglobin 1.1  0.5 - 1.5 %    Methemoglobin 1.1  0.0 - 1.5 %  CBC     Status: Abnormal   Collection Time   01/22/12  4:00 PM      Component Value Range Comment   WBC 5.5  4.0 - 10.5 K/uL    RBC 3.50 (*) 4.22 - 5.81 MIL/uL    Hemoglobin 11.5 (*) 13.0 - 17.0 g/dL    HCT 16.1 (*) 09.6 - 52.0 %    MCV 93.7  78.0 - 100.0 fL    MCH 32.9  26.0 - 34.0 pg    MCHC 35.1  30.0 - 36.0 g/dL    RDW 04.5  40.9 - 81.1 %    Platelets 133 (*) 150 - 400 K/uL   PROTIME-INR     Status: Abnormal   Collection Time   01/22/12  4:00 PM      Component Value Range Comment   Prothrombin Time 19.7 (*) 11.6 - 15.2 seconds    INR 1.64 (*) 0.00 - 1.49   APTT     Status: Abnormal   Collection Time   01/22/12  4:00 PM      Component Value Range Comment   aPTT 42 (*) 24 - 37 seconds   POCT I-STAT 4, (NA,K, GLUC, HGB,HCT)     Status: Abnormal   Collection Time   01/22/12  4:02 PM      Component Value Range Comment   Sodium 141  135 - 145 mEq/L    Potassium 4.0  3.5 - 5.1 mEq/L    Glucose, Bld 163 (*) 70 - 99 mg/dL    HCT 91.4 (*) 78.2 - 52.0 %    Hemoglobin 10.9 (*) 13.0 - 17.0 g/dL   POCT I-STAT 3, BLOOD GAS (G3+)     Status: Abnormal   Collection Time   01/22/12  4:07 PM      Component Value Range Comment   pH, Arterial 7.363  7.350 - 7.450    pCO2 arterial 45.0  35.0 - 45.0 mmHg    pO2, Arterial 237.0 (*) 80.0 - 100.0 mmHg    Bicarbonate 26.2 (*) 20.0 - 24.0 mEq/L    TCO2 28  0 - 100 mmol/L    O2 Saturation 100.0      Patient temperature 34.7 C      Collection site ARTERIAL LINE      Drawn by Operator      Sample type ARTERIAL     GLUCOSE, CAPILLARY     Status: Abnormal   Collection Time   01/22/12  5:02 PM      Component Value Range Comment   Glucose-Capillary 145 (*) 70 - 99 mg/dL   GLUCOSE, CAPILLARY     Status: Abnormal   Collection Time   01/22/12  5:58 PM      Component Value Range Comment   Glucose-Capillary 129 (*) 70 - 99 mg/dL   GLUCOSE, CAPILLARY     Status: Abnormal   Collection Time   01/22/12  6:56 PM      Component Value Range Comment    Glucose-Capillary 108 (*) 70 - 99 mg/dL   GLUCOSE, CAPILLARY     Status: Abnormal   Collection Time   01/22/12  7:44 PM      Component Value Range Comment   Glucose-Capillary 100 (*) 70 - 99 mg/dL   GLUCOSE, CAPILLARY     Status: Abnormal   Collection Time   01/22/12  8:44 PM      Component Value Range Comment   Glucose-Capillary 130 (*) 70 - 99 mg/dL  CBC     Status: Abnormal   Collection Time   01/22/12  9:15 PM      Component Value Range Comment   WBC 12.9 (*) 4.0 - 10.5 K/uL    RBC 3.42 (*) 4.22 - 5.81 MIL/uL    Hemoglobin 11.5 (*) 13.0 - 17.0 g/dL    HCT 11.9 (*) 14.7 - 52.0 %    MCV 91.5  78.0 - 100.0 fL    MCH 33.6  26.0 - 34.0 pg    MCHC 36.7 (*) 30.0 - 36.0 g/dL    RDW 82.9  56.2 - 13.0 %    Platelets 126 (*) 150 - 400 K/uL   CREATININE, SERUM     Status: Abnormal   Collection Time   01/22/12  9:15 PM      Component Value Range Comment   Creatinine, Ser 0.96  0.50 - 1.35 mg/dL    GFR calc non Af Amer 82 (*) >90 mL/min    GFR calc Af Amer >90  >90 mL/min   MAGNESIUM     Status: Abnormal   Collection Time   01/22/12  9:15 PM      Component Value Range Comment   Magnesium 2.7 (*) 1.5 - 2.5 mg/dL   POCT I-STAT, CHEM 8     Status: Abnormal   Collection Time   01/22/12  9:15 PM      Component Value Range Comment   Sodium 141  135 - 145 mEq/L    Potassium 3.2 (*) 3.5 - 5.1 mEq/L    Chloride 104  96 - 112 mEq/L    BUN 12  6 - 23 mg/dL    Creatinine, Ser 8.65  0.50 - 1.35 mg/dL    Glucose, Bld 784 (*) 70 - 99 mg/dL    Calcium, Ion 6.96  2.95 - 1.30 mmol/L    TCO2 21  0 - 100 mmol/L    Hemoglobin 10.2 (*) 13.0 - 17.0 g/dL    HCT 28.4 (*) 13.2 - 52.0 %   POCT I-STAT 3, BLOOD GAS (G3+)     Status: Abnormal   Collection Time   01/22/12  9:18 PM      Component Value Range Comment   pH, Arterial 7.439  7.350 - 7.450    pCO2 arterial 34.7 (*) 35.0 - 45.0 mmHg    pO2, Arterial 86.0  80.0 - 100.0 mmHg    Bicarbonate 23.6  20.0 - 24.0 mEq/L    TCO2 25  0 - 100 mmol/L    O2  Saturation 97.0      Patient temperature 36.6 C      Collection site RADIAL, ALLEN'S TEST ACCEPTABLE      Drawn by Nurse      Sample type ARTERIAL     GLUCOSE, CAPILLARY     Status: Abnormal   Collection Time   01/22/12 10:00 PM      Component Value Range Comment   Glucose-Capillary 122 (*) 70 - 99 mg/dL   GLUCOSE, CAPILLARY     Status: Abnormal   Collection Time   01/22/12 10:50 PM      Component Value Range Comment   Glucose-Capillary 120 (*) 70 - 99 mg/dL   GLUCOSE, CAPILLARY     Status: Abnormal   Collection Time   01/23/12 12:04 AM      Component Value Range Comment   Glucose-Capillary 108 (*) 70 - 99 mg/dL   GLUCOSE, CAPILLARY     Status: Abnormal   Collection  Time   01/23/12  1:06 AM      Component Value Range Comment   Glucose-Capillary 104 (*) 70 - 99 mg/dL   GLUCOSE, CAPILLARY     Status: Abnormal   Collection Time   01/23/12  2:07 AM      Component Value Range Comment   Glucose-Capillary 115 (*) 70 - 99 mg/dL   GLUCOSE, CAPILLARY     Status: Abnormal   Collection Time   01/23/12  3:12 AM      Component Value Range Comment   Glucose-Capillary 100 (*) 70 - 99 mg/dL   POCT I-STAT 3, BLOOD GAS (G3+)     Status: Normal   Collection Time   01/23/12  4:24 AM      Component Value Range Comment   pH, Arterial 7.416  7.350 - 7.450    pCO2 arterial 35.9  35.0 - 45.0 mmHg    pO2, Arterial 92.0  80.0 - 100.0 mmHg    Bicarbonate 23.0  20.0 - 24.0 mEq/L    TCO2 24  0 - 100 mmol/L    O2 Saturation 97.0      Acid-base deficit 1.0  0.0 - 2.0 mmol/L    Patient temperature 37.3 C      Collection site RADIAL, ALLEN'S TEST ACCEPTABLE      Sample type ARTERIAL     CBC     Status: Abnormal   Collection Time   01/23/12  4:28 AM      Component Value Range Comment   WBC 14.6 (*) 4.0 - 10.5 K/uL    RBC 3.25 (*) 4.22 - 5.81 MIL/uL    Hemoglobin 10.7 (*) 13.0 - 17.0 g/dL    HCT 32.4 (*) 40.1 - 52.0 %    MCV 91.7  78.0 - 100.0 fL    MCH 32.9  26.0 - 34.0 pg    MCHC 35.9  30.0 - 36.0  g/dL    RDW 02.7  25.3 - 66.4 %    Platelets 130 (*) 150 - 400 K/uL   BASIC METABOLIC PANEL     Status: Abnormal   Collection Time   01/23/12  4:28 AM      Component Value Range Comment   Sodium 139  135 - 145 mEq/L    Potassium 4.9  3.5 - 5.1 mEq/L    Chloride 106  96 - 112 mEq/L    CO2 22  19 - 32 mEq/L    Glucose, Bld 119 (*) 70 - 99 mg/dL    BUN 14  6 - 23 mg/dL    Creatinine, Ser 4.03  0.50 - 1.35 mg/dL    Calcium 8.1 (*) 8.4 - 10.5 mg/dL    GFR calc non Af Amer 82 (*) >90 mL/min    GFR calc Af Amer >90  >90 mL/min   MAGNESIUM     Status: Normal   Collection Time   01/23/12  4:28 AM      Component Value Range Comment   Magnesium 2.3  1.5 - 2.5 mg/dL   GLUCOSE, CAPILLARY     Status: Abnormal   Collection Time   01/23/12  4:55 AM      Component Value Range Comment   Glucose-Capillary 121 (*) 70 - 99 mg/dL   GLUCOSE, CAPILLARY     Status: Abnormal   Collection Time   01/23/12  6:02 AM      Component Value Range Comment   Glucose-Capillary 134 (*) 70 - 99 mg/dL   GLUCOSE,  CAPILLARY     Status: Abnormal   Collection Time   01/23/12  7:05 AM      Component Value Range Comment   Glucose-Capillary 117 (*) 70 - 99 mg/dL   GLUCOSE, CAPILLARY     Status: Abnormal   Collection Time   01/23/12  8:04 AM      Component Value Range Comment   Glucose-Capillary 122 (*) 70 - 99 mg/dL   POCT I-STAT 3, BLOOD GAS (G3+)     Status: Abnormal   Collection Time   01/23/12  9:29 AM      Component Value Range Comment   pH, Arterial 7.435  7.350 - 7.450    pCO2 arterial 36.7  35.0 - 45.0 mmHg    pO2, Arterial 112.0 (*) 80.0 - 100.0 mmHg    Bicarbonate 24.6 (*) 20.0 - 24.0 mEq/L    TCO2 26  0 - 100 mmol/L    O2 Saturation 98.0      Acid-Base Excess 1.0  0.0 - 2.0 mmol/L    Patient temperature 37.4 C      Sample type ARTERIAL       Imaging: Imaging results have been reviewed  Assessment/Plan:   1. Principal Problem: 2.  *NSTEMI (non-ST elevated myocardial infarction) 3. Active  Problems: 4.  H/O ascending aorta repair 5.  HTN (hypertension) 6.  Thrombocytopenia 7.  Tobacco abuse 8.  Staghorn renal calculus 9.  Left ventricular apical thrombus following MI, NSTEMI, 01/13/12 10.  DM (diabetes mellitus) 11.  CAD (coronary artery disease), 3-V at cath 01/16/12 12.  COPD (chronic obstructive pulmonary disease) 13.  Acute renal insufficiency, SCr up to 1.43 14.  Ischemic cardiomyopathy, EF 40-45% 2D 01/15/12 15.  LV (left ventricular) mural thrombus following MI 16.  Carotid arterial disease, severe LICA stenosis 17.  History of stroke 76.   Time Spent Directly with Patient:  20  minutes  Length of Stay:  LOS: 10 days   POD # 0 CABG X 3, LCEA. On low dose neo. AV pacing. Exam benign. Neurologically intact. Treatment per [facility] .  Nawal Burling J 01/23/2012, 11:08 AM

## 2012-01-24 ENCOUNTER — Inpatient Hospital Stay (HOSPITAL_COMMUNITY): Payer: Medicare Other

## 2012-01-24 DIAGNOSIS — Z9889 Other specified postprocedural states: Secondary | ICD-10-CM

## 2012-01-24 DIAGNOSIS — Z951 Presence of aortocoronary bypass graft: Secondary | ICD-10-CM

## 2012-01-24 LAB — CBC
HCT: 27.4 % — ABNORMAL LOW (ref 39.0–52.0)
Hemoglobin: 9.4 g/dL — ABNORMAL LOW (ref 13.0–17.0)
MCH: 32.8 pg (ref 26.0–34.0)
MCHC: 34.3 g/dL (ref 30.0–36.0)
MCV: 95.5 fL (ref 78.0–100.0)
Platelets: 86 10*3/uL — ABNORMAL LOW (ref 150–400)
RBC: 2.87 MIL/uL — ABNORMAL LOW (ref 4.22–5.81)
RDW: 15.3 % (ref 11.5–15.5)
WBC: 9.1 10*3/uL (ref 4.0–10.5)

## 2012-01-24 LAB — GLUCOSE, CAPILLARY
Glucose-Capillary: 114 mg/dL — ABNORMAL HIGH (ref 70–99)
Glucose-Capillary: 134 mg/dL — ABNORMAL HIGH (ref 70–99)
Glucose-Capillary: 141 mg/dL — ABNORMAL HIGH (ref 70–99)
Glucose-Capillary: 124 mg/dL — ABNORMAL HIGH (ref 70–99)

## 2012-01-24 LAB — BASIC METABOLIC PANEL
BUN: 15 mg/dL (ref 6–23)
CO2: 25 mEq/L (ref 19–32)
Calcium: 8.5 mg/dL (ref 8.4–10.5)
Chloride: 105 mEq/L (ref 96–112)
Creatinine, Ser: 0.48 mg/dL — ABNORMAL LOW (ref 0.50–1.35)
GFR calc Af Amer: >90 mL/min (ref >90)
GFR calc non Af Amer: >90 mL/min (ref >90)
Glucose, Bld: 158 mg/dL — ABNORMAL HIGH (ref 70–99)
Potassium: 5.4 mEq/L — ABNORMAL HIGH (ref 3.5–5.1)
Sodium: 139 mEq/L (ref 135–145)

## 2012-01-24 MED ORDER — ENSURE COMPLETE PO LIQD
237.0000 mL | Freq: Two times a day (BID) | ORAL | Status: DC
  Administered 2012-01-25 – 2012-01-29 (×8): 237 mL via ORAL

## 2012-01-24 MED ORDER — FUROSEMIDE 10 MG/ML IJ SOLN
20.0000 mg | Freq: Once | INTRAMUSCULAR | Status: AC
  Administered 2012-01-24: 20 mg via INTRAVENOUS

## 2012-01-24 MED ORDER — DOPAMINE-DEXTROSE 3.2-5 MG/ML-% IV SOLN
3.0000 ug/kg/min | INTRAVENOUS | Status: DC
  Administered 2012-01-24: 2 ug/kg/min via INTRAVENOUS

## 2012-01-24 MED ORDER — SODIUM CHLORIDE 0.9 % IV SOLN
INTRAVENOUS | Status: DC
  Administered 2012-01-24: 22:00:00 via INTRAVENOUS
  Filled 2012-01-24: qty 1

## 2012-01-24 MED ORDER — ALBUMIN HUMAN 5 % IV SOLN
12.5000 g | Freq: Once | INTRAVENOUS | Status: AC
  Administered 2012-01-24: 12.5 g via INTRAVENOUS
  Filled 2012-01-24: qty 250

## 2012-01-24 MED ORDER — FUROSEMIDE 10 MG/ML IJ SOLN: 40.0000 mg | Freq: Once | INTRAMUSCULAR | Status: DC

## 2012-01-24 MED FILL — Lidocaine HCl IV Inj 20 MG/ML: INTRAVENOUS | Qty: 5 | Status: AC

## 2012-01-24 MED FILL — Electrolyte-R (PH 7.4) Solution: INTRAVENOUS | Qty: 4000 | Status: AC

## 2012-01-24 MED FILL — Heparin Sodium (Porcine) Inj 1000 Unit/ML: INTRAMUSCULAR | Qty: 30 | Status: AC

## 2012-01-24 MED FILL — Mannitol IV Soln 20%: INTRAVENOUS | Qty: 500 | Status: AC

## 2012-01-24 MED FILL — Sodium Chloride Irrigation Soln 0.9%: Qty: 3000 | Status: AC

## 2012-01-24 MED FILL — Sodium Bicarbonate IV Soln 8.4%: INTRAVENOUS | Qty: 50 | Status: AC

## 2012-01-24 MED FILL — Heparin Sodium (Porcine) Inj 1000 Unit/ML: INTRAMUSCULAR | Qty: 10 | Status: AC

## 2012-01-24 MED FILL — Sodium Chloride IV Soln 0.9%: INTRAVENOUS | Qty: 1000 | Status: AC

## 2012-01-24 NOTE — Progress Notes (Signed)
Subjective:  Up in chair  Objective:  Vital Signs in the last 24 hours: Temp:  [97.9 F (36.6 C)-99.3 F (37.4 C)] 97.9 F (36.6 C) (07/31 0831) Pulse Rate:  [83-111] 88  (07/31 0834) Resp:  [0-25] 21  (07/31 0834) BP: (87-112)/(45-86) 103/53 mmHg (07/31 0834) SpO2:  [89 %-100 %] 96 % (07/31 0834) Arterial Line BP: (106-124)/(43-52) 122/49 mmHg (07/30 1145) Weight:  [74.2 kg (163 lb 9.3 oz)] 74.2 kg (163 lb 9.3 oz) (07/31 0600)  Intake/Output from previous day:  Intake/Output Summary (Last 24 hours) at 01/24/12 0908 Last data filed at 01/24/12 0845  Gross per 24 hour  Intake 953.74 ml  Output   4820 ml  Net -3866.26 ml    Physical Exam: General appearance: alert, cooperative and no distress Lungs: decreased at bases Heart: regular rate and rhythm   Rate: 85-paced  Rhythm: normal sinus rhythm and currently paced at 85  Lab Results:  Basename 01/24/12 0415 01/23/12 1715 01/23/12 1700  WBC 9.1 -- 12.7*  HGB 9.4* 8.8* --  PLT 86* -- 99*    Basename 01/24/12 0415 01/23/12 1715 01/23/12 0428  NA 139 136 --  K 5.4* 5.1 --  CL 105 103 --  CO2 25 -- 22  GLUCOSE 158* 185* --  BUN 15 14 --  CREATININE 0.48* 1.00 --   No results found for this basename: TROPONINI:2,CK,MB:2 in the last 72 hours Hepatic Function Panel No results found for this basename: PROT,ALBUMIN,AST,ALT,ALKPHOS,BILITOT,BILIDIR,IBILI in the last 72 hours No results found for this basename: CHOL in the last 72 hours  Basename 01/22/12 1600  INR 1.64*    Imaging: Dg Chest Portable 1 View In Am  01/24/2012  *RADIOLOGY REPORT*  Clinical Data: coronary bypass, extubated  PORTABLE CHEST - 1 VIEW  Comparison: 01/23/2012  Findings: Interval extubation.  Swan-Ganz catheter and NG tube also removed.  Right IJ vascular sheath, mediastinal drain and bilateral chest tubes remain in place.  Coronary bypass changes evident. Heart remains enlarged with vascular congestion and basilar atelectasis.  Small effusions  suspected.  No pneumothorax.  IMPRESSION: Extubated.  Vascular congestion and atelectasis.  No pneumothorax.  Original Report Authenticated By: Judie Petit. Ruel Favors, M.D.   Dg Chest Portable 1 View In Am  01/23/2012  *RADIOLOGY REPORT*  Clinical Data: Postop CABG  PORTABLE CHEST - 1 VIEW  Comparison: Portable chest x-ray of 01/22/2012  Findings: There has been slight increase in haziness at the lung bases most consistent with atelectasis and possibly small effusions, left greater than right.  Mild cardiomegaly is stable. Bilateral chest tubes remain and the endotracheal tube and Swan- Ganz catheter are unchanged in position.  IMPRESSION: Slight increase in bibasilar opacities left greater than right most consistent with atelectasis and possibly small left effusion.  Original Report Authenticated By: Juline Patch, M.D.   Dg Chest Portable 1 View  01/22/2012  *RADIOLOGY REPORT*  Clinical Data: Postop.  PORTABLE CHEST - 1 VIEW  Comparison: 01/13/2012.  Findings: Interval post CABG changes.  Enlarged cardiac silhouette with mild improvement.  Prominent interstitial markings without significant change.  No pleural fluid.  Endotracheal tube in satisfactory position.  Mediastinal and bilateral chest tubes without pneumothorax.  Right jugular Swan- Ganz catheter tip in the proximal right main pulmonary artery. Nasogastric tube extending into the stomach.  IMPRESSION: Status post CABG without acute abnormality.  Stable chronic interstitial lung disease.  Original Report Authenticated By: [name], M.D.    Cardiac Studies:  Assessment/Plan:   Principal Problem:  *  NSTEMI (non-ST elevated myocardial infarction) Active Problems:  CAD (coronary artery disease), 3-V at cath 01/16/12  S/P CABG x 3     01/23/12  S/P carotid endarterectomy   Lt, 01/23/12  H/O ascending aorta repair  Thrombocytopenia  Left ventricular apical thrombus following MI, NSTEMI, 01/13/12  DM (diabetes mellitus)  COPD (chronic obstructive  pulmonary disease)  Acute renal insufficiency, SCr up to 1.43  Ischemic cardiomyopathy, EF 40-45% 2D 01/15/12  LV (left ventricular) mural thrombus following MI  Carotid arterial disease, severe LICA stenosis  HTN (hypertension)  Tobacco abuse  Staghorn renal calculus  History of stroke  Plan- per CVTS    Corine Shelter PA-C 01/24/2012, 9:08 AM    Agree with note written by Corine Shelter PAC  POD #2 CABG with combined CEA. Looks great. Labs OK. Pacing. Exam benign. BP soft. Plan per TCTS.  Runell Gess 01/24/2012 9:12 AM

## 2012-01-24 NOTE — Progress Notes (Addendum)
[address]            [place] [address]          [phone]     2 Days Post-Op Procedure(s) (LRB): CORONARY ARTERY BYPASS GRAFTING (CABG) (N/A) ENDARTERECTOMY CAROTID (Left)  Subjective: OOB in chair.  Sore, otherwise stable.   Objective: Vital signs in last 24 hours: Patient Vitals for the past 24 hrs:  BP Temp Temp src Pulse Resp SpO2 Weight  01/24/12 0800 91/45 mmHg - - 87  21  97 % -  01/24/12 0732 - - - - - 96 % -  01/24/12 0700 94/53 mmHg - - 88  0  96 % -  01/24/12 0600 103/60 mmHg - - 88  13  97 % 163 lb 9.3 oz (74.2 kg)  01/24/12 0500 95/54 mmHg - - 88  18  94 % -  01/24/12 0400 103/56 mmHg 98.3 F (36.8 C) Oral 88  4  95 % -  01/24/12 0300 95/56 mmHg - - 88  0  96 % -  01/24/12 0200 112/62 mmHg - - 88  20  96 % -  01/24/12 0100 87/53 mmHg - - 88  17  95 % -  01/24/12 0000 96/51 mmHg 98.2 F (36.8 C) Oral 88  16  97 % -  01/23/12 2300 99/58 mmHg - - 90  17  95 % -  01/23/12 2200 100/58 mmHg - - 88  16  96 % -  01/23/12 2100 88/50 mmHg - - 88  19  94 % -  01/23/12 2030 - 98.2 F (36.8 C) Oral - - - -  01/23/12 2017 - - - - - 96 % -  01/23/12 2000 100/65 mmHg - - 88  22  96 % -  01/23/12 1900 90/53 mmHg - - 88  20  98 % -  01/23/12 1845 95/49 mmHg - - 90  21  98 % -  01/23/12 1830 101/61 mmHg - - 91  21  96 % -  01/23/12 1815 106/60 mmHg - - 84  21  95 % -  01/23/12 1700 105/59 mmHg - - 88  24  99 % -  01/23/12 1621 - 97.9 F (36.6 C) - - - - -  01/23/12 1600 112/55 mmHg - - 87  25  89 % -  01/23/12 1515 111/62 mmHg - - 87  20  96 % -  01/23/12 1500 109/62 mmHg - - 88  17  100 % -  01/23/12 1445 109/60 mmHg - - 87  18  96 % -  01/23/12 1430 104/55 mmHg - - 87  18  98 % -  01/23/12 1415 110/56 mmHg - - 87  18  97 % -  01/23/12 1412 - 98.8 F (37.1 C) - 88  19  98 % -  01/23/12 1409 - 98.8 F (37.1 C) - 87  22  100 % -  01/23/12 1405 - - - - - 100 % -  01/23/12 1400 107/86 mmHg 98.6 F (37 C) - 88  25  100 % -    01/23/12 1300 99/57 mmHg 98.8 F (37.1 C) - 111  20  94 % -  01/23/12 1255 - 98.8 F (37.1 C) - 87  20  98 % -  01/23/12 1245 99/50 mmHg 99 F (37.2 C) - 88  20  99 % -  01/23/12 1230 111/54 mmHg 99 F (37.2 C) - 88  21  99 % -  01/23/12 1215 105/52 mmHg 98.8 F (37.1 C) - 88  17  99 % -  01/23/12 1200 100/52 mmHg 99 F (37.2 C) - 87  21  97 % -  01/23/12 1145 106/56 mmHg 99 F (37.2 C) - 88  18  99 % -  01/23/12 1136 - 99 F (37.2 C) Core - - - -  01/23/12 1130 - 98.8 F (37.1 C) - 88  17  96 % -  01/23/12 1115 - 99 F (37.2 C) - 88  19  100 % -  01/23/12 1100 91/49 mmHg 99 F (37.2 C) - 89  17  97 % -  01/23/12 1045 - 99 F (37.2 C) - 89  20  97 % -  01/23/12 1035 - - - - 20  96 % -  01/23/12 1030 - 99.1 F (37.3 C) - 89  20  97 % -  01/23/12 1000 103/63 mmHg 99.1 F (37.3 C) - 83  21  99 % -  01/23/12 0945 - - - - - 96 % -  01/23/12 0915 - 99.3 F (37.4 C) - 83  21  100 % -  01/23/12 0900 88/45 mmHg 99.5 F (37.5 C) - 75  19  99 % -  01/23/12 0845 - 99.5 F (37.5 C) - 74  19  99 % -  01/23/12 0844 93/46 mmHg 99.5 F (37.5 C) - 73  23  99 % -  01/23/12 0830 - 99.7 F (37.6 C) - 75  19  99 % -  01/23/12 0815 119/49 mmHg 99.9 F (37.7 C) - 75  14  99 % -   Current Weight  01/24/12 163 lb 9.3 oz (74.2 kg)     Intake/Output from previous day: 07/30 0701 - 07/31 0700 In: 1072.6 [P.O.:60; I.V.:632.6; NG/GT:30; IV Piggyback:350] Out: 4940 [Urine:4260; Emesis/NG output:200; Chest Tube:480]  Drips: Dopamine 3 mcg/kg/min   CBGs 168-185-134-140-141-158  PHYSICAL EXAM:  Heart: RRR, 65, SR under pacer Lungs: clear Wound: clean and dry Extremities: mild LE edema   Lab Results: CBC: Basename 01/24/12 0415 01/23/12 1715 01/23/12 1700  WBC 9.1 -- 12.7*  HGB 9.4* 8.8* --  HCT 27.4* 26.0* --  PLT 86* -- 99*   BMET:  Basename 01/24/12 0415 01/23/12 1715 01/23/12 0428  NA 139 136 --  K 5.4* 5.1 --  CL 105 103 --  CO2 25 -- 22  GLUCOSE 158* 185* --  BUN  15 14 --  CREATININE 0.48* 1.00 --  CALCIUM 8.5 -- 8.1*    PT/INR:  Basename 01/22/12 1600  LABPROT 19.7*  INR 1.64*    CXR: IMPRESSION:  Extubated.  Vascular congestion and atelectasis.  No pneumothorax.   Assessment/Plan: S/P Procedure(s) (LRB): CORONARY ARTERY BYPASS GRAFTING (CABG) (N/A) ENDARTERECTOMY CAROTID (Left)  CV- BPs borderline.  Pt on renal dose dopamine for severe LV dysfunction. Wean gtts as tolerated.  Currently AAI paced at 88, SR in 60s under pacer.   Hyperkalemia- not receiving exogenous K+.  Monitor.  Vol overload- UOP excellent.  Continue diuresis.  Elevated CBGs- on low dose Lantus, SSI.  A1C= 5.3  H/O prior R sided CVA- will order PT consult.  Speech therapy consult to assess swallowing.  ? D/c remaining CT.  Leave Foley for now to measure UOP.   LOS: 11 days    Charles Hatfield H 01/24/2012

## 2012-01-24 NOTE — Progress Notes (Signed)
VASCULAR AND VEIN SURGERY POST - OP CEA PROGRESS NOTE  Date of Surgery: 01/13/2012 - 01/22/2012 Surgeon: Moishe Spice): Kerin Perna, MD Larina Earthly, MD 2 Days Post-Op left Carotid Endarterectomy .  HPI: Charles Hatfield is a 71 y.o. male who is 2 Days Post-Op left Carotid Endarterectomy . Patient is doing well. Pre-operative symptoms are Improved Patient denies headache; Patient denies difficulty swallowing; denies weakness in upper or lower extremities; Pt. denies other symptoms of stroke or TIA.  IMAGING: Dg Chest Portable 1 View In Am  01/23/2012  *RADIOLOGY REPORT*  Clinical Data: Postop CABG  PORTABLE CHEST - 1 VIEW  Comparison: Portable chest x-ray of 01/22/2012  Findings: There has been slight increase in haziness at the lung bases most consistent with atelectasis and possibly small effusions, left greater than right.  Mild cardiomegaly is stable. Bilateral chest tubes remain and the endotracheal tube and Swan- Ganz catheter are unchanged in position.  IMPRESSION: Slight increase in bibasilar opacities left greater than right most consistent with atelectasis and possibly small left effusion.  Original Report Authenticated By: Juline Patch, M.D.   Dg Chest Portable 1 View  01/22/2012  *RADIOLOGY REPORT*  Clinical Data: Postop.  PORTABLE CHEST - 1 VIEW  Comparison: 01/13/2012.  Findings: Interval post CABG changes.  Enlarged cardiac silhouette with mild improvement.  Prominent interstitial markings without significant change.  No pleural fluid.  Endotracheal tube in satisfactory position.  Mediastinal and bilateral chest tubes without pneumothorax.  Right jugular Swan- Ganz catheter tip in the proximal right main pulmonary artery. Nasogastric tube extending into the stomach.  IMPRESSION: Status post CABG without acute abnormality.  Stable chronic interstitial lung disease.  Original Report Authenticated By: [name], M.D.    Significant Diagnostic Studies: CBC Lab Results    Component Value Date   WBC 9.1 01/24/2012   HGB 9.4* 01/24/2012   HCT 27.4* 01/24/2012   MCV 95.5 01/24/2012   PLT 86* 01/24/2012    BMET    Component Value Date/Time   NA 139 01/24/2012 0415   K 5.4* 01/24/2012 0415   CL 105 01/24/2012 0415   CO2 25 01/24/2012 0415   GLUCOSE 158* 01/24/2012 0415   BUN 15 01/24/2012 0415   CREATININE 0.48* 01/24/2012 0415   CALCIUM 8.5 01/24/2012 0415   GFRNONAA >90 01/24/2012 0415   GFRAA >90 01/24/2012 0415    COAG Lab Results  Component Value Date   INR 1.64* 01/22/2012   INR 1.15 01/16/2012   INR 1.11 01/15/2012   No results found for this basename: PTT      Intake/Output Summary (Last 24 hours) at 01/24/12 0710 Last data filed at 01/24/12 0600  Gross per 24 hour  Intake 1048.86 ml  Output   4760 ml  Net -3711.14 ml    Physical Exam:  BP Readings from Last 3 Encounters:  01/24/12 103/60  01/24/12 103/60  01/24/12 103/60   Temp Readings from Last 3 Encounters:  01/24/12 98.3 F (36.8 C) Oral  01/24/12 98.3 F (36.8 C) Oral  01/24/12 98.3 F (36.8 C) Oral   SpO2 Readings from Last 3 Encounters:  01/24/12 97%  01/24/12 97%  01/24/12 97%   Pulse Readings from Last 3 Encounters:  01/24/12 88  01/24/12 88  01/24/12 88    Pt is A&O x 3 Speech is fluent left Neck Wound is clean, dry, intact or healing well Patient with Negative tongue deviation and Negative facial droop Pt has good and equal strength in all  extremities  Assessment: Charles Hatfield is a 71 y.o. male is S/P Left Carotid endarterectomy Pt is voiding, ambulating and taking po well   Plan: Discharge to: Home once stable Follow-up in 4 weeks   [facility] [phone] 01/24/2012 7:10 AM

## 2012-01-24 NOTE — Significant Event (Signed)
1658pm-pt responded to albumin X 1, dopamine was weaned and turned off at 1458pm. BP now SBP 100-110. Will await for further orders. Rashaad Hallstrom, Charity fundraiser.

## 2012-01-24 NOTE — Significant Event (Signed)
Pt just completed walking in unit, ambulated 300 feet, VS stable, pt tolerated ambulation well. Pt has also ambulated with PT today. Pt is settled in chair to eat dinner. Will monitor. Enid Maultsby, Charity fundraiser.

## 2012-01-24 NOTE — Progress Notes (Addendum)
INITIAL ADULT NUTRITION ASSESSMENT Date: 01/24/2012   Time: 3:40 PM  INTERVENTION:  Ensure Complete twice daily (350 kcals, 13 gm protein per 8 fl oz bottle) RD to follow for nutrition care plan  Reason for Assessment: Health Hx  ASSESSMENT: Male 71 y.o.  Dx: NSTEMI (non-ST elevated myocardial infarction)  Hx:  Past Medical History  Diagnosis Date  . Hypertension   . Diabetes mellitus   . H/O ascending aorta repair   . Stroke   . Hematuria 01/15/2012  . Staghorn renal calculus 01/15/2012  . Left ventricular apical thrombus following MI, NSTEMI, 01/13/12 01/15/2012  . DM (diabetes mellitus) 01/15/2012    Related Meds:     . acetaminophen  1,000 mg Oral Q6H   Or  . acetaminophen (TYLENOL) oral liquid 160 mg/5 mL  975 mg Per Tube Q6H  . albumin human  12.5 g Intravenous Once  . aspirin EC  325 mg Oral Daily   Or  . aspirin  324 mg Per Tube Daily  . atorvastatin  20 mg Oral q1800  . bisacodyl  10 mg Oral Daily   Or  . bisacodyl  10 mg Rectal Daily  . cefUROXime (ZINACEF)  IV  1.5 g Intravenous Q12H  . docusate sodium  200 mg Oral Daily  . Fluticasone-Salmeterol  1 puff Inhalation BID  . furosemide  40 mg Intravenous Once  . insulin aspart  0-24 Units Subcutaneous Q4H  . insulin glargine  10 Units Subcutaneous Daily  . levalbuterol  1.25 mg Nebulization TID  . metoprolol tartrate  12.5 mg Oral BID   Or  . metoprolol tartrate  12.5 mg Per Tube BID  . pantoprazole  40 mg Oral Q1200  . sodium chloride  3 mL Intravenous Q12H    Ht: 5\' 6"  (167.6 cm)  Wt: 163 lb 9.3 oz (74.2 kg)  Ideal Wt: 64.5 kg % Ideal Wt: 115%  Usual Wt: --- % Usual Wt: ---  Body mass index is 26.40 kg/(m^2).  Food/Nutrition Related Hx: problems chewing or swallowing foods and/or liquids per admission nutrition screen  Labs:  CMP     Component Value Date/Time   NA 139 01/24/2012 0415   K 5.4* 01/24/2012 0415   CL 105 01/24/2012 0415   CO2 25 01/24/2012 0415   GLUCOSE 158* 01/24/2012 0415     BUN 15 01/24/2012 0415   CREATININE 0.48* 01/24/2012 0415   CALCIUM 8.5 01/24/2012 0415   PROT 7.4 01/13/2012 2030   ALBUMIN 4.2 01/13/2012 2030   AST 27 01/13/2012 2030   ALT 14 01/13/2012 2030   ALKPHOS 74 01/13/2012 2030   BILITOT 0.4 01/13/2012 2030   GFRNONAA >90 01/24/2012 0415   GFRAA >90 01/24/2012 0415     Intake/Output Summary (Last 24 hours) at 01/24/12 1541 Last data filed at 01/24/12 1500  Gross per 24 hour  Intake 845.06 ml  Output   4280 ml  Net -3434.94 ml    CBG (last 3)   Basename 01/24/12 1205 01/24/12 0828 01/24/12 0418  GLUCAP 124* 135* 141*    Diet Order: Dysphagia 1, thin liquids  Supplements/Tube Feeding: N/A  IVF:    sodium chloride Last Rate: 20 mL/hr at 01/23/12 0700  sodium chloride   sodium chloride Last Rate: 250 mL (01/24/12 0700)  DOPamine Last Rate: 2 mcg/kg/min (01/24/12 1345)  norepinephrine (LEVOPHED) Adult infusion Last Rate: Stopped (01/23/12 1700)  DISCONTD: DOPamine Last Rate: 3 mcg/kg/min (01/24/12 0400)    Estimated Nutritional Needs:   Kcal: 1800-2000 Protein:  80-90 gm Fluid: 1.8-2.0 L  Patient s/p CABG 7/29; prior to surgery was consuming 75-100% of meals per flowsheet records; s/p bedside swallow evaluation today -- SLP recommending diet upgrade to Dysphagia 2, thin liquid diet; patient lethargic upon RD visitation; would benefit from addition of supplements due to predicted suboptimal intake and post-op state -- RD to order.  NUTRITION DIAGNOSIS: -Increased nutrient needs (NI-5.1).  Status: Ongoing  RELATED TO: post-op healing  AS EVIDENCE BY: estimated nutrition needs  MONITORING/EVALUATION(Goals): Goal: Oral intake with meals & supplements to meet >/= 90% of estimated nutrition needs Goal: PO & supplemental intake, weight, labs, I/O's  EDUCATION NEEDS: -No education needs identified at this time  Dietitian #: [phone]  DOCUMENTATION CODES Per approved criteria  -Not Applicable    Alger Memos 01/24/2012, 3:40 PM

## 2012-01-24 NOTE — Progress Notes (Signed)
Patient ID: Charles Hatfield, male   DOB: 29-Dec-1940, 71 y.o.   MRN: [id number]                   [address]            [place] [address]          [phone]     2 Days Post-Op Procedure(s) (LRB): CORONARY ARTERY BYPASS GRAFTING (CABG) (N/A) ENDARTERECTOMY CAROTID (Left)  Total Length of Stay:  LOS: 11 days  BP 109/57  Pulse 87  Temp 98.3 F (36.8 C) (Oral)  Resp 22  Ht 5\' 6"  (1.676 m)  Wt 163 lb 9.3 oz (74.2 kg)  BMI 26.40 kg/m2  SpO2 97%     . sodium chloride 20 mL/hr at 01/23/12 0700  . sodium chloride    . sodium chloride 250 mL (01/24/12 0700)  . DOPamine Stopped (01/24/12 1458)  . norepinephrine (LEVOPHED) Adult infusion Stopped (01/23/12 1700)  . DISCONTD: DOPamine 3 mcg/kg/min (01/24/12 0400)     Lab Results  Component Value Date   WBC 9.1 01/24/2012   HGB 9.4* 01/24/2012   HCT 27.4* 01/24/2012   PLT 86* 01/24/2012   GLUCOSE 158* 01/24/2012   CHOL 141 01/14/2012   TRIG 246* 01/14/2012   HDL 30* 01/14/2012   LDLCALC 62 01/14/2012   ALT 14 01/13/2012   AST 27 01/13/2012   NA 139 01/24/2012   K 5.4* 01/24/2012   CL 105 01/24/2012   CREATININE 0.48* 01/24/2012   BUN 15 01/24/2012   CO2 25 01/24/2012   TSH 0.589 01/14/2012   INR 1.64* 01/22/2012   HGBA1C 5.3 01/20/2012   Off drips Passed swallow test walked  [name] MD  Beeper [phone] Office [phone] 01/24/2012 5:57 PM

## 2012-01-24 NOTE — Evaluation (Signed)
Clinical/Bedside Swallow Evaluation Patient Details  Name: Charles Hatfield MRN: [id number] Date of Birth: 23-Aug-1940  Today's Date: 01/24/2012 Time: 1315-1330 SLP Time Calculation (min): 15 min  Past Medical History:  Past Medical History  Diagnosis Date  . Hypertension   . Diabetes mellitus   . H/O ascending aorta repair   . Stroke   . Hematuria 01/15/2012  . Staghorn renal calculus 01/15/2012  . Left ventricular apical thrombus following MI, NSTEMI, 01/13/12 01/15/2012  . DM (diabetes mellitus) 01/15/2012   Past Surgical History:  Past Surgical History  Procedure Date  . Repair thoracic aorta   . Coronary artery bypass graft 01/22/2012    Procedure: CORONARY ARTERY BYPASS GRAFTING (CABG);  Surgeon: Kerin Perna, MD;  Location: [facility] OR;  Service: Open Heart Surgery;  Laterality: N/A;  CABG x three using left internal mammary artery and right leg greater saphenous vein harvested endoscopically  . Endarterectomy 01/22/2012    Procedure: ENDARTERECTOMY CAROTID;  Surgeon: Larina Earthly, MD;  Location: [facility] OR;  Service: Vascular;  Laterality: Left;   HPI:  71 year old gentleman presenting with chest pain consistent with non-ST elevation MI, CAD.  Underwent CABGx3 and left CEA 7/30; extubated after procedure.  Orders today for clinical swallowing eval.   Assessment / Plan / Recommendation Clinical Impression  Pt presents with functional swallow with no indication of compromised airway protection.  Presents with right central CN VII deficit, most likely baseline from prior CVA.  Oral deficits are present with solid food residue in right cheek, but laryngo-pharyngeal function appears functional.  Recommend upgrading diet to Dysphagia 2, thin liquids; meds whole with water.     Aspiration Risk  Mild    Diet Recommendation Dysphagia 2 (Fine chop);Thin liquid   Liquid Administration via: Cup Medication Administration: Whole meds with liquid Supervision: Patient able to self  feed Compensations: Slow rate;Small sips/bites Postural Changes and/or Swallow Maneuvers: Seated upright 90 degrees    Other  Recommendations Oral Care Recommendations: Oral care BID   Follow Up Recommendations  None    Frequency and Duration min 1 x/week  1 week       SLP Swallow Goals Patient will utilize recommended strategies during swallow to increase swallowing safety with: Independent assistance  Levette Paulick L. Samson Frederic, [place] CCC/SLP Pager [phone]  Blenda Mounts Laurice 01/24/2012,1:42 PM

## 2012-01-24 NOTE — Evaluation (Signed)
Physical Therapy Evaluation Patient Details Name: Charles Hatfield MRN: [id number] DOB: 10-Mar-1941 Today's Date: 01/24/2012 Time: 4098-1191 PT Time Calculation (min): 15 min  PT Assessment / Plan / Recommendation Clinical Impression  Pt adm with MI.  Underwent CABG and CEA.  Needs skilled PT to maximize I and safety so pt can eventually return home with son.  Feel pt's progress will be slow due to previous stroke.  Recommend ST-SNF prior to return home.    PT Assessment  Patient needs continued PT services    Follow Up Recommendations  Skilled nursing facility    Barriers to Discharge Decreased caregiver support      Equipment Recommendations  Defer to next venue    Recommendations for Other Services     Frequency Min 3X/week    Precautions / Restrictions Precautions Precautions: Sternal   Pertinent Vitals/Pain VSS. Incisional pain.  Pt called for pain meds.      Mobility  Bed Mobility Bed Mobility: Supine to Sit;Sitting - Scoot to Edge of Bed Supine to Sit: 4: Min assist;HOB elevated Sitting - Scoot to Edge of Bed: 3: Mod assist Details for Bed Mobility Assistance: Assist to bring trunk up.  Used bed pad to bring hips to EOB to follow hip precautions. Transfers Transfers: Sit to Stand;Stand to Sit Sit to Stand: Without upper extremity assist;3: Mod assist;From bed Stand to Sit: To chair/3-in-1;Without upper extremity assist;4: Min assist Details for Transfer Assistance: verbal/tactile cues to not push with hands to follow sternal precautions. Ambulation/Gait Ambulation/Gait Assistance: 4: Min assist Ambulation Distance (Feet): 15 Feet Assistive device:  (pushing w/c) Gait Pattern: Decreased dorsiflexion - right;Decreased hip/knee flexion - right;Decreased step length - right Gait velocity: slow, deliberate cadence    Exercises     PT Diagnosis: Difficulty walking;Generalized weakness  PT Problem List: Decreased strength;Decreased activity tolerance;Decreased  balance;Decreased mobility;Decreased knowledge of precautions;Pain PT Treatment Interventions: DME instruction;Gait training;Functional mobility training;Patient/family education;Therapeutic activities;Therapeutic exercise;Balance training   PT Goals Acute Rehab PT Goals PT Goal Formulation: With patient Time For Goal Achievement: 02/07/12 Potential to Achieve Goals: Good Pt will go Supine/Side to Sit: with supervision PT Goal: Supine/Side to Sit - Progress: Goal set today Pt will go Sit to Supine/Side: with supervision PT Goal: Sit to Supine/Side - Progress: Goal set today Pt will go Sit to Stand: with supervision PT Goal: Sit to Stand - Progress: Goal set today Pt will go Stand to Sit: with supervision PT Goal: Stand to Sit - Progress: Goal set today Pt will Ambulate: 51 - 150 feet;with least restrictive assistive device;with supervision PT Goal: Ambulate - Progress: Goal set today  Visit Information  Last PT Received On: 01/24/12 Assistance Needed: +2 (for lines and tubes)    Subjective Data  Subjective: "No," when asked if he wore a brace on his lt foot. Patient Stated Goal: Pt agreeable to working on incr mobility and returning to baseline mobility   Prior Functioning  Home Living Lives With: Son Available Help at Discharge: Available PRN/intermittently;Family Type of Home: Other (Comment) (hotel) Home Access: Level entry Home Layout: One level Home Adaptive Equipment: Walker - rolling Prior Function Level of Independence: Independent with assistive device(s) Driving: No Vocation: On disability Communication Communication: Other (comment) (slurred speech from prior stroke)    Cognition  Overall Cognitive Status: Appears within functional limits for tasks assessed/performed Arousal/Alertness: Awake/alert Behavior During Session: Flat affect    Extremity/Trunk Assessment Right Lower Extremity Assessment RLE ROM/Strength/Tone: Deficits RLE ROM/Strength/Tone  Deficits: ankle 2-/5, knee 3-/5, hip 3-/5  Left Lower Extremity Assessment LLE ROM/Strength/Tone: [facility] for tasks assessed   Balance Static Standing Balance Static Standing - Balance Support: Left upper extremity supported Static Standing - Level of Assistance: 4: Min assist  End of Session PT - End of Session Activity Tolerance: Patient limited by fatigue Patient left: in chair;with call bell/phone within reach  GP     [facility] 01/24/2012, 2:30 PM  [facility] PT [phone]

## 2012-01-24 NOTE — Significant Event (Signed)
0815-received verbal order this morning from MD Donata Clay to discontinue right PT and MT. Confirmed orders with Almira Coaster as there was written order to D/C only right PT. Received order to discontinue 2 chest tubes, leaving left PT intact as verbally told by MD. Wallis Bamberg, RN.

## 2012-01-24 NOTE — Significant Event (Signed)
Patient with SBP 80-90. Dopamine at highest thus far. Spoken with Coral Ceo, PA to give updates. MD Donata Clay called back and gave orders to give albumin x 1, not to increase dopamine >75mcg, hold lasix, start neo if needed. Pt is being monitored closely. Divine Hansley, Charity fundraiser.

## 2012-01-24 NOTE — Significant Event (Signed)
1940-lasix 20mg  given now (instead of when scanned in computer at 1901). BP 113/53. Will monitor. Keigan Tafoya, Charity fundraiser.

## 2012-01-25 ENCOUNTER — Encounter (HOSPITAL_COMMUNITY): Payer: Self-pay | Admitting: *Deleted

## 2012-01-25 ENCOUNTER — Inpatient Hospital Stay (HOSPITAL_COMMUNITY): Payer: Medicare Other

## 2012-01-25 LAB — BASIC METABOLIC PANEL
BUN: 20 mg/dL (ref 6–23)
CO2: 27 mEq/L (ref 19–32)
Chloride: 102 mEq/L (ref 96–112)
Creatinine, Ser: 1.16 mg/dL (ref 0.50–1.35)
Glucose, Bld: 93 mg/dL (ref 70–99)

## 2012-01-25 LAB — GLUCOSE, CAPILLARY
Glucose-Capillary: 109 mg/dL — ABNORMAL HIGH (ref 70–99)
Glucose-Capillary: 117 mg/dL — ABNORMAL HIGH (ref 70–99)
Glucose-Capillary: 132 mg/dL — ABNORMAL HIGH (ref 70–99)
Glucose-Capillary: 143 mg/dL — ABNORMAL HIGH (ref 70–99)
Glucose-Capillary: 151 mg/dL — ABNORMAL HIGH (ref 70–99)
Glucose-Capillary: 90 mg/dL (ref 70–99)
Glucose-Capillary: 90 mg/dL (ref 70–99)
Glucose-Capillary: 90 mg/dL (ref 70–99)

## 2012-01-25 LAB — CBC
HCT: 26.6 % — ABNORMAL LOW (ref 39.0–52.0)
MCH: 34.2 pg — ABNORMAL HIGH (ref 26.0–34.0)
MCHC: 35.7 g/dL (ref 30.0–36.0)
MCV: 95.7 fL (ref 78.0–100.0)
RDW: 14.9 % (ref 11.5–15.5)

## 2012-01-25 MED ORDER — SODIUM CHLORIDE 0.9 % IJ SOLN: 3.0000 mL | INTRAMUSCULAR | Status: DC | PRN

## 2012-01-25 MED ORDER — INSULIN ASPART 100 UNIT/ML ~~LOC~~ SOLN
0.0000 [IU] | Freq: Three times a day (TID) | SUBCUTANEOUS | Status: DC
  Administered 2012-01-25 – 2012-01-26 (×2): 2 [IU] via SUBCUTANEOUS
  Administered 2012-01-26: 4 [IU] via SUBCUTANEOUS
  Administered 2012-01-26: 2 [IU] via SUBCUTANEOUS
  Administered 2012-01-26: 4 [IU] via SUBCUTANEOUS
  Administered 2012-01-27 (×2): 2 [IU] via SUBCUTANEOUS
  Administered 2012-01-27 (×2): 4 [IU] via SUBCUTANEOUS
  Administered 2012-01-28 – 2012-01-29 (×3): 2 [IU] via SUBCUTANEOUS
  Administered 2012-01-29: 3 [IU] via SUBCUTANEOUS

## 2012-01-25 MED ORDER — PANTOPRAZOLE SODIUM 40 MG PO TBEC
40.0000 mg | DELAYED_RELEASE_TABLET | Freq: Every day | ORAL | Status: DC
  Administered 2012-01-26 – 2012-01-29 (×4): 40 mg via ORAL
  Filled 2012-01-25 (×4): qty 1

## 2012-01-25 MED ORDER — STARCH (THICKENING) PO POWD
ORAL | Status: DC | PRN
  Filled 2012-01-25: qty 227

## 2012-01-25 MED ORDER — SODIUM CHLORIDE 0.9 % IV SOLN: 250.0000 mL | INTRAVENOUS | Status: DC | PRN

## 2012-01-25 MED ORDER — INSULIN ASPART 100 UNIT/ML ~~LOC~~ SOLN: 0.0000 [IU] | SUBCUTANEOUS | Status: DC

## 2012-01-25 MED ORDER — MOVING RIGHT ALONG BOOK
Freq: Once | Status: AC
  Administered 2012-01-26: 15:00:00
  Filled 2012-01-25: qty 1

## 2012-01-25 MED ORDER — MAGNESIUM HYDROXIDE 400 MG/5ML PO SUSP: 15.0000 mL | Freq: Every day | ORAL | Status: DC | PRN

## 2012-01-25 MED ORDER — FUROSEMIDE 40 MG PO TABS
40.0000 mg | ORAL_TABLET | Freq: Every day | ORAL | Status: DC
  Administered 2012-01-25 – 2012-01-29 (×5): 40 mg via ORAL
  Filled 2012-01-25 (×5): qty 1

## 2012-01-25 MED ORDER — SODIUM CHLORIDE 0.9 % IJ SOLN
3.0000 mL | Freq: Two times a day (BID) | INTRAMUSCULAR | Status: DC
  Administered 2012-01-28: 3 mL via INTRAVENOUS

## 2012-01-25 NOTE — Progress Notes (Addendum)
[address]            [name] [address]          [phone]     3 Days Post-Op Procedure(s) (LRB): CORONARY ARTERY BYPASS GRAFTING (CABG) (N/A) ENDARTERECTOMY CAROTID (Left)  Subjective: Getting ready to walk with nurse.  Feels well, no complaints.  Objective: Vital signs in last 24 hours: Patient Vitals for the past 24 hrs:  BP Temp Temp src Pulse Resp SpO2 Weight  01/25/12 0738 - 98.1 F (36.7 C) Oral - - - -  01/25/12 0700 86/63 mmHg - - 80  20  98 % -  01/25/12 0600 110/56 mmHg - - 80  19  98 % -  01/25/12 0500 126/55 mmHg - - 79  19  99 % -  01/25/12 0454 - - - - - - 154 lb 5.2 oz (70 kg)  01/25/12 0400 119/62 mmHg 98.1 F (36.7 C) Oral 83  21  99 % -  01/25/12 0300 134/56 mmHg - - 84  20  90 % -  01/25/12 0200 111/62 mmHg - - - 19  94 % -  01/25/12 0100 105/51 mmHg - - 80  15  98 % -  01/25/12 0000 98/51 mmHg 98.1 F (36.7 C) Oral 103  28  96 % -  01/24/12 2300 139/56 mmHg - - 77  19  96 % -  01/24/12 2200 128/63 mmHg - - 79  20  98 % -  01/24/12 2100 97/50 mmHg - - 79  16  97 % -  01/24/12 2050 103/52 mmHg - - 79  18  97 % -  01/24/12 2032 - - - - - 98 % -  01/24/12 2015 - 98.4 F (36.9 C) Oral - - - -  01/24/12 2000 89/42 mmHg - - - 19  98 % -  01/24/12 1930 113/53 mmHg - - - 20  - -  01/24/12 1915 - - - - 18  - -  01/24/12 1900 94/45 mmHg - - - 22  - -  01/24/12 1845 - - - - 20  100 % -  01/24/12 1830 107/50 mmHg - - - 23  100 % -  01/24/12 1800 133/70 mmHg - - 76  15  99 % -  01/24/12 1745 - - - 88  19  98 % -  01/24/12 1730 112/58 mmHg - - 88  14  99 % -  01/24/12 1715 - - - 88  0  96 % -  01/24/12 1700 119/59 mmHg - - 88  19  96 % -  01/24/12 1647 - 98.3 F (36.8 C) Oral - - - -  01/24/12 1645 109/57 mmHg - - 87  22  97 % -  01/24/12 1630 115/59 mmHg - - 87  18  97 % -  01/24/12 1615 105/53 mmHg - - 87  22  98 % -  01/24/12 1600 97/46 mmHg - - 88  20  96 % -  01/24/12 1545 94/46 mmHg - - 88  21  96 % -    01/24/12 1530 - - - - 23  - -  01/24/12 1515 112/50 mmHg - - - 20  - -  01/24/12 1500 110/62 mmHg - - 85  27  96 % -  01/24/12 1445 105/52 mmHg - - 88  29  98 % -  01/24/12 1433 - - - - - 99 % -  01/24/12 1430 91/49 mmHg - - 88  26  96 % -  01/24/12 1415 92/50 mmHg - - 88  26  96 % -  01/24/12 1400 96/56 mmHg - - 88  27  95 % -  01/24/12 1345 110/55 mmHg - - 88  27  96 % -  01/24/12 1315 103/51 mmHg - - 88  25  95 % -  01/24/12 1300 94/48 mmHg - - 87  24  96 % -  01/24/12 1245 94/66 mmHg - - 88  21  95 % -  01/24/12 1230 86/49 mmHg - - 88  24  94 % -  01/24/12 1215 89/44 mmHg - - 87  26  96 % -  01/24/12 1206 - 97.3 F (36.3 C) Oral - - - -  01/24/12 1200 91/50 mmHg - - 88  27  96 % -  01/24/12 1145 84/48 mmHg - - 88  20  96 % -  01/24/12 1130 85/48 mmHg - - 88  23  96 % -  01/24/12 1115 83/51 mmHg - - 88  25  96 % -  01/24/12 1100 101/61 mmHg - - 88  27  99 % -  01/24/12 1045 92/54 mmHg - - 88  25  96 % -  01/24/12 1030 76/47 mmHg - - 88  25  98 % -  01/24/12 1015 86/45 mmHg - - 87  25  96 % -  01/24/12 1000 82/50 mmHg - - 88  25  94 % -  01/24/12 0945 93/59 mmHg - - 87  19  97 % -  01/24/12 0930 91/56 mmHg - - 87  22  98 % -  01/24/12 0915 89/46 mmHg - - 88  24  96 % -  01/24/12 0900 90/52 mmHg - - 87  21  95 % -  01/24/12 0845 90/48 mmHg - - 88  23  96 % -  01/24/12 0834 103/53 mmHg - - 88  21  96 % -  01/24/12 0831 - 97.9 F (36.6 C) Oral - - - -  01/24/12 0800 91/45 mmHg - - 87  21  97 % -   Current Weight  01/25/12 154 lb 5.2 oz (70 kg)     Intake/Output from previous day: 07/31 0701 - 08/01 0700 In: 1700.3 [P.O.:950; I.V.:448.3; IV Piggyback:302] Out: 3875 [Urine:3530; Chest Tube:345]  CBGs 192-196-172-143-117-109-90-90-93-90  PHYSICAL EXAM:  Heart: AAI paced at 88,SR under pacer Lungs: clear Wound: clean and dry Extremities:mild LE edema    Lab Results: CBC: Basename 01/25/12 0450 01/24/12 0415  WBC 8.3 9.1  HGB 9.5* 9.4*  HCT 26.6* 27.4*  PLT  86* 86*   BMET:  Basename 01/25/12 0450 01/24/12 0415  NA 140 139  K 4.2 5.4*  CL 102 105  CO2 27 25  GLUCOSE 93 158*  BUN 20 15  CREATININE 1.16 0.48*  CALCIUM 9.0 8.5    PT/INR:  Basename 01/22/12 1600  LABPROT 19.7*  INR 1.64*   CXR: stable atelectasis, L>R   Assessment/Plan: S/P Procedure(s) (LRB): CORONARY ARTERY BYPASS GRAFTING (CABG) (N/A) ENDARTERECTOMY CAROTID (Left)  CV- Off Dopamine, BPs improved, though still low normal.  AAI paced at 88, SR under pacer.  Hopefully can d/c pacer.  Vol overload- diurese.  Hyperkalemia- improved.  Monitor.  CBGs improving.  Not diabetic.  Will d/c Lantus, continue SSI.  Deconditioning- Continue  ambulation/PT.    Thrombocytopenia- plts stable.  Watch.  Nutrition- will advance to D2 diet per Speech tx recommendations.  Hopefully can d/c last CT today and tx to stepdown soon.   LOS: 12 days    COLLINS,GINA H 01/25/2012   ready for transfer to stepdown will need eval for SNF due to old CVA No evidence of LV mural thrombus on TEE - no coumadin Diet per SLT

## 2012-01-25 NOTE — Progress Notes (Signed)
Clinical Social Work-CSW attempted to assess pt for SNF- pt working with respiratory therapy-CSW will re-visit and assess- Jodean Lima, [phone]

## 2012-01-25 NOTE — Progress Notes (Signed)
Pt transferred to 2015 via wheelchair. VSS. Pt to bed and placed on monitor.

## 2012-01-25 NOTE — Progress Notes (Signed)
Clinical Social Work Department CLINICAL SOCIAL WORK PLACEMENT NOTE 01/25/2012  Patient:  Charles Hatfield, Charles Hatfield  Account Number:  [id number] Admit date:  01/13/2012  Clinical Social Worker:  Bonnye Fava, LCSW  Date/time:  01/25/2012 04:10 PM  Clinical Social Work is seeking post-discharge placement for this patient at the following level of care:   SKILLED NURSING   (*CSW will update this form in Epic as items are completed)   01/25/2012  Patient/family provided with [facility] Department of Clinical Social Work's list of facilities offering this level of care within the geographic area requested by the patient (or if unable, by the patient's family).  01/25/2012  Patient/family informed of their freedom to choose among providers that offer the needed level of care, that participate in Medicare, Medicaid or managed care program needed by the patient, have an available bed and are willing to accept the patient.  01/25/2012  Patient/family informed of [facility]' ownership interest in [facility], as well as of the fact that they are under no obligation to receive care at this facility.  PASARR submitted to EDS on 01/25/2012 PASARR number received from EDS on   FL2 transmitted to all facilities in geographic area requested by pt/family on  01/25/2012 FL2 transmitted to all facilities within larger geographic area on 01/25/2012  Patient informed that his/her managed care company has contracts with or will negotiate with  certain facilities, including the following:     Patient/family informed of bed offers received:   Patient chooses bed at  Physician recommends and patient chooses bed at    Patient to be transferred to  on   Patient to be transferred to facility by   The following physician request were entered in Epic:   Additional Comments:  Jodean Lima, [phone]

## 2012-01-25 NOTE — Progress Notes (Signed)
Speech Language Pathology Dysphagia Treatment Patient Details Name: Charles Hatfield MRN: [id number] DOB: May 29, 1941 Today's Date: 01/25/2012 Time: 4098-1191 SLP Time Calculation (min): 15 min  Assessment / Plan / Recommendation Clinical Impression  Pt again presents with normal orophayrngeal swallow function; no sings of aspriation observed. Pt has mild pocketing in right buccal cavity which he independently removes after 2-3 bites. Pt toelrating diet, no SLP f/u needed at this time. Continue Dys 2 diet/thin liquids.     Diet Recommendation  Continue with Current Diet: Dysphagia 2 (fine chop);Thin liquid    SLP Plan All goals met;Discharge SLP treatment due to (comment)   Pertinent Vitals/Pain NA   Swallowing Goals  SLP Swallowing Goals Patient will consume recommended diet without observed clinical signs of aspiration with: Independent assistance Swallow Study Goal #1 - Progress: Met Patient will utilize recommended strategies during swallow to increase swallowing safety with: Independent assistance Swallow Study Goal #2 - Progress: Met  General Temperature Spikes Noted: No Respiratory Status: Room air Behavior/Cognition: Alert;Cooperative Oral Cavity - Dentition: Missing dentition Patient Positioning: Upright in bed  Oral Cavity - Oral Hygiene     Dysphagia Treatment Treatment focused on: Skilled observation of diet tolerance;Upgraded PO texture trials Treatment Methods/Modalities: Skilled observation Patient observed directly with PO's: Yes Type of PO's observed: Dysphagia 1 (puree);Dysphagia 3 (soft);Thin liquids Feeding: Able to feed self Liquids provided via: Cup;Straw Oral Phase Signs & Symptoms: Right pocketing Type of cueing: Verbal Amount of cueing: Minimal   GO     Earnestine Shipp, Riley Nearing 01/25/2012, 11:03 AM

## 2012-01-25 NOTE — Progress Notes (Signed)
Physical Therapy Treatment Patient Details Name: Charles Hatfield MRN: [id number] DOB: 12-22-1940 Today's Date: 01/25/2012 Time: 1230-1243 PT Time Calculation (min): 13 min  PT Assessment / Plan / Recommendation Comments on Treatment Session       Follow Up Recommendations       Barriers to Discharge        Equipment Recommendations       Recommendations for Other Services    Frequency     Plan      Precautions / Restrictions Precautions Precautions: Sternal;Fall   Pertinent Vitals/Pain VSS    Mobility  Bed Mobility Supine to Sit: 4: Min assist;HOB elevated Sitting - Scoot to Edge of Bed: 3: Mod assist Details for Bed Mobility Assistance: Assist to bring trunk up and hips to EOB to follow sternal precautions. Transfers Sit to Stand: Without upper extremity assist;4: Min assist;From bed Stand to Sit: Without upper extremity assist;4: Min assist;To chair/3-in-1 Details for Transfer Assistance: verbal cues for hand placement. Ambulation/Gait Ambulation/Gait Assistance: 4: Min assist Ambulation Distance (Feet): 200 Feet Assistive device:  (pushing w/c) Gait Pattern: Decreased dorsiflexion - right;Decreased hip/knee flexion - right;Decreased step length - right    Exercises     PT Diagnosis:    PT Problem List:   PT Treatment Interventions:     PT Goals    Visit Information  Last PT Received On: 01/25/12 Assistance Needed: +1    Subjective Data  Subjective: Pt states he has lost a lot of weight in past several years.   Cognition  Overall Cognitive Status: Appears within functional limits for tasks assessed/performed Arousal/Alertness: Awake/alert Behavior During Session: [facility] for tasks performed    Balance  Static Standing Balance Static Standing - Balance Support: Bilateral upper extremity supported Static Standing - Level of Assistance: 4: Min assist  End of Session     GP     Charles Hatfield 01/25/2012, 1:50 PM  [facility] PT [phone]

## 2012-01-25 NOTE — Progress Notes (Signed)
Clinical Social Work Department BRIEF PSYCHOSOCIAL ASSESSMENT 01/25/2012  Patient:  Charles Hatfield, Charles Hatfield     Account Number:  [id number]     Admit date:  01/13/2012  Clinical Social Worker:  Conley Simmonds  Date/Time:  01/25/2012 02:00 PM  Referred by:  Physician  Date Referred:  01/25/2012 Referred for  SNF Placement   Other Referral:   Interview type:  Patient Other interview type:    PSYCHOSOCIAL DATA Living Status:  FAMILY Admitted from facility:   Level of care:   Primary support name:  Michael-[phone] Primary support relationship to patient:  CHILD, ADULT Degree of support available:   undetermined at this time    CURRENT CONCERNS Current Concerns  Post-Acute Placement   Other Concerns:    SOCIAL WORK ASSESSMENT / PLAN CSW met with pt at bedside to review/discuss possible options at d/c-  Pt currently lives with son at an Extended Stay hotel in [place] where they have lived for the past month-  CSW discussed various pt/ot needs and options with regards to SNF placement reviewed process of placement an will initiate bed search in [facility]-  CSW provided SNF list and contact information-  Pt displays an understanding of social work role and will contact CSW as any questions or needs arise   Assessment/plan status:  Psychosocial Support/Ongoing Assessment of Needs Other assessment/ plan:   Information/referral to community resources:   ST Rehab within 50 miles of [employer]    PATIENT'S/FAMILY'S RESPONSE TO PLAN OF CARE: Pt agreeable to Rehab/SNF and was able to display an understanding of information given to him for SNF-  CSW will contact son per pt request to assist with decision making-  Pt appears more alert and is oriented x3 at time of assessment-  CSW will f/u with bed offers. Jodean Lima, [phone]
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
## 2012-01-25 NOTE — Progress Notes (Signed)
Subjective:  Awake and alert  Objective:  Vital Signs in the last 24 hours: Temp:  [97.3 F (36.3 C)-98.4 F (36.9 C)] 98.1 F (36.7 C) (08/01 0738) Pulse Rate:  [76-103] 79  (08/01 0800) Resp:  [0-29] 17  (08/01 0800) BP: (76-139)/(42-73) 117/73 mmHg (08/01 0800) SpO2:  [90 %-100 %] 99 % (08/01 0800) Weight:  [70 kg (154 lb 5.2 oz)] 70 kg (154 lb 5.2 oz) (08/01 0454)  Intake/Output from previous day:  Intake/Output Summary (Last 24 hours) at 01/25/12 0927 Last data filed at 01/25/12 0754  Gross per 24 hour  Intake 1532.25 ml  Output   3285 ml  Net -1752.75 ml    Physical Exam: General appearance: alert, cooperative and no distress Lungs: decreased breath sounds Heart: regular rate and rhythm   Rate: 70  Rhythm: normal sinus rhythm and pacing on demand  Lab Results:  Basename 01/25/12 0450 01/24/12 0415  WBC 8.3 9.1  HGB 9.5* 9.4*  PLT 86* 86*    Basename 01/25/12 0450 01/24/12 0415  NA 140 139  K 4.2 5.4*  CL 102 105  CO2 27 25  GLUCOSE 93 158*  BUN 20 15  CREATININE 1.16 0.48*   No results found for this basename: TROPONINI:2,CK,MB:2 in the last 72 hours Hepatic Function Panel No results found for this basename: PROT,ALBUMIN,AST,ALT,ALKPHOS,BILITOT,BILIDIR,IBILI in the last 72 hours No results found for this basename: CHOL in the last 72 hours  Basename 01/22/12 1600  INR 1.64*    Imaging: Dg Chest Port 1 View  01/25/2012  *RADIOLOGY REPORT*  Clinical Data: Status post CABG with atelectasis  PORTABLE CHEST - 1 VIEW  Comparison: 01/24/2012  Findings: A right-sided chest tube has been removed. The left-sided chest tube is unchanged. The introducer sheath in the right internal jugular has been removed.  The patient is status post median sternotomy and CABG. Cardiomegaly is again identified and is stable in degree.  Aortic ectasia is stable.  There has been interval decrease in the degree of pulmonary vascular congestion noted with clearing of atelectasis at  the right lung base.  Persistent density at the left lung base is most compatible with residual atelectasis.  No pneumothorax is noted. A small residual left pleural effusion is suspected No new infiltrates or signs of significant central peribronchial cuffing are seen.  Bony structures are intact.  IMPRESSION: Improved pulmonary vascular congestion with probable residual left lower lobe volume loss.  Lines and tubes as above.  Original Report Authenticated By: Bertha Stakes, M.D.   Dg Chest Portable 1 View In Am  01/24/2012  *RADIOLOGY REPORT*  Clinical Data: coronary bypass, extubated  PORTABLE CHEST - 1 VIEW  Comparison: 01/23/2012  Findings: Interval extubation.  Swan-Ganz catheter and NG tube also removed.  Right IJ vascular sheath, mediastinal drain and bilateral chest tubes remain in place.  Coronary bypass changes evident. Heart remains enlarged with vascular congestion and basilar atelectasis.  Small effusions suspected.  No pneumothorax.  IMPRESSION: Extubated.  Vascular congestion and atelectasis.  No pneumothorax.  Original Report Authenticated By: Judie Petit. Ruel Favors, M.D.    Cardiac Studies:  Assessment/Plan:   Principal Problem:  *NSTEMI (non-ST elevated myocardial infarction) Active Problems:  CAD (coronary artery disease), 3-V at cath 01/16/12  S/P CABG x 3     01/23/12  S/P carotid endarterectomy   Lt, 01/23/12  H/O ascending aorta repair  Thrombocytopenia  Left ventricular apical thrombus following MI, NSTEMI, 01/13/12  DM (diabetes mellitus)  COPD (chronic obstructive pulmonary disease)  Acute renal insufficiency, SCr up to 1.43  Ischemic cardiomyopathy, EF 40-45% 2D 01/15/12  Carotid arterial disease, severe LICA stenosis  HTN (hypertension)  Tobacco abuse  Staghorn renal calculus  History of stroke   Paln- Will discuss with MD. He had an LV thrombus after MI, ? Does he need Coumadin   Corine Shelter PA-C 01/25/2012, 9:27 AM

## 2012-01-25 NOTE — Progress Notes (Signed)
Pt. Seen and examined. Agree with the NP/PA-C note as written.  Appears to be doing well. According to Dr. Lorrin Mais note, there was no evidence for LV mural thrombus on interoperative TEE.  This mass was well visualized on TTE, which begs the question of whether it has embolized or dissolved, versus whether it is present, however, not well visualized as the LV apex is difficult to see on TEE. I would agree not to start coumadin at this point, but consider repeat TTE (transthoracic) limited echo prior to discharge to evaluate for resolution of the thrombus.  Chrystie Nose, MD, [facility] Attending Cardiologist [facility]

## 2012-01-26 ENCOUNTER — Inpatient Hospital Stay (HOSPITAL_COMMUNITY): Payer: Medicare Other

## 2012-01-26 LAB — BASIC METABOLIC PANEL
BUN: 19 mg/dL (ref 6–23)
CO2: 27 mEq/L (ref 19–32)
Calcium: 9 mg/dL (ref 8.4–10.5)
Chloride: 101 mEq/L (ref 96–112)
Creatinine, Ser: 1.17 mg/dL (ref 0.50–1.35)
GFR calc Af Amer: 71 mL/min — ABNORMAL LOW (ref >90)
GFR calc non Af Amer: 61 mL/min — ABNORMAL LOW (ref >90)
Glucose, Bld: 145 mg/dL — ABNORMAL HIGH (ref 70–99)
Potassium: 4 mEq/L (ref 3.5–5.1)
Sodium: 140 mEq/L (ref 135–145)

## 2012-01-26 LAB — GLUCOSE, CAPILLARY
Glucose-Capillary: 127 mg/dL — ABNORMAL HIGH (ref 70–99)
Glucose-Capillary: 160 mg/dL — ABNORMAL HIGH (ref 70–99)

## 2012-01-26 LAB — CBC
HCT: 28.2 % — ABNORMAL LOW (ref 39.0–52.0)
Hemoglobin: 9.5 g/dL — ABNORMAL LOW (ref 13.0–17.0)
MCH: 32.4 pg (ref 26.0–34.0)
MCHC: 33.7 g/dL (ref 30.0–36.0)
MCV: 96.2 fL (ref 78.0–100.0)
Platelets: 96 10*3/uL — ABNORMAL LOW (ref 150–400)
RBC: 2.93 MIL/uL — ABNORMAL LOW (ref 4.22–5.81)
RDW: 14.8 % (ref 11.5–15.5)
WBC: 8.7 10*3/uL (ref 4.0–10.5)

## 2012-01-26 LAB — TYPE AND SCREEN: Antibody Screen: NEGATIVE

## 2012-01-26 MED ORDER — METOPROLOL SUCCINATE 12.5 MG HALF TABLET
12.5000 mg | ORAL_TABLET | Freq: Every day | ORAL | Status: DC
  Administered 2012-01-27 – 2012-01-29 (×3): 12.5 mg via ORAL
  Filled 2012-01-26 (×3): qty 1

## 2012-01-26 MED ORDER — METOPROLOL SUCCINATE 12.5 MG HALF TABLET
12.5000 mg | ORAL_TABLET | Freq: Every day | ORAL | Status: DC
  Filled 2012-01-26: qty 1

## 2012-01-26 MED ORDER — AMIODARONE HCL IN DEXTROSE 360-4.14 MG/200ML-% IV SOLN
60.0000 mg/h | INTRAVENOUS | Status: DC
  Administered 2012-01-26: 60 mg/h via INTRAVENOUS
  Filled 2012-01-26 (×2): qty 200

## 2012-01-26 MED ORDER — AMIODARONE LOAD VIA INFUSION
150.0000 mg | Freq: Once | INTRAVENOUS | Status: AC
  Administered 2012-01-26: 150 mg via INTRAVENOUS
  Filled 2012-01-26: qty 83.34

## 2012-01-26 MED ORDER — AMIODARONE HCL 200 MG PO TABS
200.0000 mg | ORAL_TABLET | Freq: Two times a day (BID) | ORAL | Status: DC
  Administered 2012-01-26 – 2012-01-29 (×7): 200 mg via ORAL
  Filled 2012-01-26 (×8): qty 1

## 2012-01-26 NOTE — Progress Notes (Signed)
CARDIAC REHAB PHASE I   PRE:  Rate/Rhythm: paced 60  BP:  Supine:   Sitting: 93/99  Standing:    SaO2: 96%RA  MODE:  Ambulation: 310 ft   POST:  Rate/Rhythem: 74paced  BP:  Supine:   Sitting: 106/75  Standing:    SaO2: 100%RA 1340-1400 Pt walked 310 ft with rolling walker and asst x 2 with steady gait. Stopped once to rest. Encouraged pt to not put pressure on arms when walking. To recliner after walk. Tolerated well. Can be asst x 1 with walker.  Duanne Limerick

## 2012-01-26 NOTE — Progress Notes (Signed)
CSW received pt from CSW  [place], [place].  Pt is for SNF.  Provided pt with bed offers received so far.  CSW will contact son to advise of bed offers per pt request.  Will follow for d/c date to be determined. Vickii Penna, LCSWA [phone]  Clinical Social Work

## 2012-01-26 NOTE — Progress Notes (Addendum)
VASCULAR AND VEIN SURGERY POST - OP CEA PROGRESS NOTE  Date of Surgery: 01/13/2012 - 01/22/2012 Surgeon: Moishe Spice): Kerin Perna, MD Larina Earthly, MD 4 Days Post-Op left Carotid Endarterectomy .  HPI: Charles Hatfield is a 71 y.o. male who is 4 Days Post-Op left Carotid Endarterectomy . Patient is doing well. Pre-operative symptoms are Improved Patient denies headache; Patient denies difficulty swallowing; denies weakness in upper or lower extremities; Pt. denies other symptoms of stroke or TIA.  Hx of old CVA.  IMAGING: Dg Chest 2 View  01/26/2012  *RADIOLOGY REPORT*  Clinical Data: Postoperative evaluation CABG  CHEST - 2 VIEW  Comparison: 01/25/2012  Findings:  artifact overlies chest.  Changes of median sternotomy and CABG.  Small pleural effusions left more than right.  Minimal basilar atelectasis.  Upper lungs are clear.  No pneumothorax. Epicardial leads also present.  IMPRESSION: Small effusions with mild basilar atelectasis.  Original Report Authenticated By: Thomasenia Sales, M.D.   Dg Chest Port 1 View  01/25/2012  *RADIOLOGY REPORT*  Clinical Data: Status post CABG with atelectasis  PORTABLE CHEST - 1 VIEW  Comparison: 01/24/2012  Findings: A right-sided chest tube has been removed. The left-sided chest tube is unchanged. The introducer sheath in the right internal jugular has been removed.  The patient is status post median sternotomy and CABG. Cardiomegaly is again identified and is stable in degree.  Aortic ectasia is stable.  There has been interval decrease in the degree of pulmonary vascular congestion noted with clearing of atelectasis at the right lung base.  Persistent density at the left lung base is most compatible with residual atelectasis.  No pneumothorax is noted. A small residual left pleural effusion is suspected No new infiltrates or signs of significant central peribronchial cuffing are seen.  Bony structures are intact.  IMPRESSION: Improved pulmonary vascular  congestion with probable residual left lower lobe volume loss.  Lines and tubes as above.  Original Report Authenticated By: Bertha Stakes, M.D.    Significant Diagnostic Studies: CBC Lab Results  Component Value Date   WBC 8.7 01/26/2012   HGB 9.5* 01/26/2012   HCT 28.2* 01/26/2012   MCV 96.2 01/26/2012   PLT 96* 01/26/2012    BMET    Component Value Date/Time   NA 140 01/26/2012 0535   K 4.0 01/26/2012 0535   CL 101 01/26/2012 0535   CO2 27 01/26/2012 0535   GLUCOSE 145* 01/26/2012 0535   BUN 19 01/26/2012 0535   CREATININE 1.17 01/26/2012 0535   CALCIUM 9.0 01/26/2012 0535   GFRNONAA 61* 01/26/2012 0535   GFRAA 71* 01/26/2012 0535    COAG Lab Results  Component Value Date   INR 1.64* 01/22/2012   INR 1.15 01/16/2012   INR 1.11 01/15/2012   No results found for this basename: PTT      Intake/Output Summary (Last 24 hours) at 01/26/12 1003 Last data filed at 01/26/12 0900  Gross per 24 hour  Intake    730 ml  Output   3022 ml  Net  -2292 ml    Physical Exam:  BP Readings from Last 3 Encounters:  01/26/12 103/56  01/26/12 103/56  01/26/12 103/56   Temp Readings from Last 3 Encounters:  01/26/12 99.3 F (37.4 C) Oral  01/26/12 99.3 F (37.4 C) Oral  01/26/12 99.3 F (37.4 C) Oral   SpO2 Readings from Last 3 Encounters:  01/26/12 98%  01/26/12 98%  01/26/12 98%   Pulse Readings from Last  3 Encounters:  01/26/12 88  01/26/12 88  01/26/12 88    Pt is A&O x 3 Speech is slurred left Neck Wound is clean, dry, intact, ecchymotic or healing well Patient with Negative tongue deviation and Positive, Negative facial droop from old CVA Pt has good and equal strength in all extremities  Assessment: Charles Hatfield is a 71 y.o. male is S/P Left Carotid endarterectomy Pt is voiding, ambulating and taking po well   Plan: Discharge to: Home Follow-up in 4 weeks   [facility] [phone] 01/26/2012 10:03 AM   I have examined the patient, reviewed and agree with  above.  EARLY, TODD, MD 01/26/2012 1:36 PM

## 2012-01-26 NOTE — Progress Notes (Addendum)
Pt HR dropped to 47, B/P 98/57, pt asymptomatic. Amiodarone drip turned off, HR back in the 60's. EKG done, MD on call notified, and gave orders to turn off amiodarone drip and cut on external pacer at AAI 70. Rapid response nurse came to check pacer settings and said they were set right. Will continue to monitor.

## 2012-01-26 NOTE — Progress Notes (Signed)
Physical Therapy Treatment Patient Details Name: Charles Hatfield MRN: [id number] DOB: [birth year] Today's Date: 01/26/2012 Time: 4098-1191 PT Time Calculation (min): 22 min  PT Assessment / Plan / Recommendation Comments on Treatment Session  Pt adm for CABG and lt CEA.  Pt making steady progress but still needs assist for mobility.    Follow Up Recommendations  Skilled nursing facility    Barriers to Discharge        Equipment Recommendations  Defer to next venue    Recommendations for Other Services    Frequency Min 3X/week   Plan Discharge plan remains appropriate;Frequency remains appropriate    Precautions / Restrictions Precautions Precautions: Sternal;Fall Restrictions Weight Bearing Restrictions: No   Pertinent Vitals/Pain VSS    Mobility  Transfers Sit to Stand: 4: Min guard;From bed;With upper extremity assist (pt used hands on walker to come up despite verbal cues.) Stand to Sit: 4: Min guard;With upper extremity assist;To chair/3-in-1;With armrests (Pt used armrest on lt during descent for balance.) Ambulation/Gait Ambulation/Gait Assistance: 4: Min guard Ambulation Distance (Feet): 250 Feet Assistive device: Rolling walker Ambulation/Gait Assistance Details: verbal cues to push walker instead of pick up walker. Gait Pattern: Decreased step length - right;Decreased hip/knee flexion - right;Decreased dorsiflexion - right    Exercises     PT Diagnosis:    PT Problem List:   PT Treatment Interventions:     PT Goals Acute Rehab PT Goals PT Goal: Sit to Stand - Progress: Progressing toward goal PT Goal: Stand to Sit - Progress: Progressing toward goal PT Goal: Ambulate - Progress: Progressing toward goal  Visit Information  Last PT Received On: 01/26/12 Assistance Needed: +1    Subjective Data  Subjective: "My son got rid of all of that," pt stated about previous DME.   Cognition  Overall Cognitive Status: Impaired Area of Impairment:  Memory Behavior During Session: WFL for tasks performed Memory: Decreased recall of precautions    Balance  Static Standing Balance Static Standing - Balance Support: During functional activity;No upper extremity supported Static Standing - Level of Assistance: 5: Stand by assistance (to use urinal)  End of Session PT - End of Session Equipment Utilized During Treatment: Gait belt Activity Tolerance: Patient tolerated treatment well Patient left: in chair;with chair alarm set;with call bell/phone within reach Nurse Communication: Mobility status   GP     [facility] 01/26/2012, 11:42 AM  [facility] PT [phone]

## 2012-01-26 NOTE — Progress Notes (Signed)
CSW relayed son, Michael's phone number per son's request. [phone]. Pt was appreciative.  Vickii Penna, LCSWA [phone]  Clinical Social Work

## 2012-01-26 NOTE — Progress Notes (Signed)
Pt HR went in to the 140's, B/P 94/63, pt asymptomatic. EKG done and showed atrial fibrillation, MD on call notified, orders to start amiodarone drip and turn off external pacer. Will continue to monitor.

## 2012-01-26 NOTE — Progress Notes (Addendum)
[address]            [place] [address]          [phone]     4 Days Post-Op  Procedure(s) (LRB): CORONARY ARTERY BYPASS GRAFTING (CABG) (N/A) ENDARTERECTOMY CAROTID (Left) Subjective: Feels ok , no new c/o  Objective  Telemetry apaced, junct in low to mid 60's underneath  Temp:  [97.4 F (36.3 C)-99.3 F (37.4 C)] 99.3 F (37.4 C) (08/02 0520) Pulse Rate:  [63-88] 88  (08/02 0520) Resp:  [15-26] 17  (08/02 0520) BP: (91-122)/(46-68) 103/56 mmHg (08/02 0520) SpO2:  [96 %-99 %] 97 % (08/02 0520) Weight:  [147 lb 11.3 oz (67 kg)] 147 lb 11.3 oz (67 kg) (08/02 0520)   Intake/Output Summary (Last 24 hours) at 01/26/12 0812 Last data filed at 01/26/12 0700  Gross per 24 hour  Intake    730 ml  Output   2922 ml  Net  -2192 ml       General appearance: alert, cooperative and no distress Heart: regular rate and rhythm and S1, S2 normal Lungs: clear to auscultation bilaterally Abdomen: soft, mild distension, + BS Extremities: no edema Wound: incisions healing  Lab Results:  Basename 01/26/12 0535 01/25/12 0450 01/23/12 1700  NA 140 140 --  K 4.0 4.2 --  CL 101 102 --  CO2 27 27 --  GLUCOSE 145* 93 --  BUN 19 20 --  CREATININE 1.17 1.16 --  CALCIUM 9.0 9.0 --  MG -- -- 2.2  PHOS -- -- --   No results found for this basename: AST:2,ALT:2,ALKPHOS:2,BILITOT:2,PROT:2,ALBUMIN:2 in the last 72 hours No results found for this basename: LIPASE:2,AMYLASE:2 in the last 72 hours  Basename 01/26/12 0535 01/25/12 0450  WBC 8.7 8.3  NEUTROABS -- --  HGB 9.5* 9.5*  HCT 28.2* 26.6*  MCV 96.2 95.7  PLT 96* 86*   No results found for this basename: CKTOTAL:4,CKMB:4,TROPONINI:4 in the last 72 hours No components found with this basename: POCBNP:3 No results found for this basename: DDIMER in the last 72 hours No results found for this basename: HGBA1C in the last 72 hours No results found for this basename:  CHOL,HDL,LDLCALC,TRIG,CHOLHDL in the last 72 hours No results found for this basename: TSH,T4TOTAL,FREET3,T3FREE,THYROIDAB in the last 72 hours No results found for this basename: VITAMINB12,FOLATE,FERRITIN,TIBC,IRON,RETICCTPCT in the last 72 hours  Medications: Scheduled    . acetaminophen  1,000 mg Oral Q6H   Or  . acetaminophen (TYLENOL) oral liquid 160 mg/5 mL  975 mg Per Tube Q6H  . amiodarone  150 mg Intravenous Once  . amiodarone  200 mg Oral BID  . aspirin EC  325 mg Oral Daily   Or  . aspirin  324 mg Per Tube Daily  . atorvastatin  20 mg Oral q1800  . bisacodyl  10 mg Oral Daily   Or  . bisacodyl  10 mg Rectal Daily  . docusate sodium  200 mg Oral Daily  . feeding supplement  237 mL Oral BID BM  . Fluticasone-Salmeterol  1 puff Inhalation BID  . furosemide  40 mg Oral Daily  . insulin aspart  0-24 Units Subcutaneous TID AC & HS  . levalbuterol  1.25 mg Nebulization TID  . metoprolol tartrate  12.5 mg Oral BID   Or  . metoprolol tartrate  12.5 mg Per Tube BID  . moving right along book  Does not apply Once  . pantoprazole  40 mg Oral QAC breakfast  . sodium chloride  3 mL Intravenous Q12H  . sodium chloride  3 mL Intravenous Q12H  . DISCONTD: furosemide  40 mg Intravenous Once  . DISCONTD: insulin aspart  0-24 Units Subcutaneous Q2H  . DISCONTD: insulin aspart  0-24 Units Subcutaneous Q4H  . DISCONTD: insulin glargine  10 Units Subcutaneous Daily  . DISCONTD: pantoprazole  40 mg Oral Q1200     Radiology/Studies:  Dg Chest 2 View  01/26/2012  *RADIOLOGY REPORT*  Clinical Data: Postoperative evaluation CABG  CHEST - 2 VIEW  Comparison: 01/25/2012  Findings:  artifact overlies chest.  Changes of median sternotomy and CABG.  Small pleural effusions left more than right.  Minimal basilar atelectasis.  Upper lungs are clear.  No pneumothorax. Epicardial leads also present.  IMPRESSION: Small effusions with mild basilar atelectasis.  Original Report Authenticated By: Thomasenia Sales, M.D.   Dg Chest Port 1 View  01/25/2012  *RADIOLOGY REPORT*  Clinical Data: Status post CABG with atelectasis  PORTABLE CHEST - 1 VIEW  Comparison: 01/24/2012  Findings: A right-sided chest tube has been removed. The left-sided chest tube is unchanged. The introducer sheath in the right internal jugular has been removed.  The patient is status post median sternotomy and CABG. Cardiomegaly is again identified and is stable in degree.  Aortic ectasia is stable.  There has been interval decrease in the degree of pulmonary vascular congestion noted with clearing of atelectasis at the right lung base.  Persistent density at the left lung base is most compatible with residual atelectasis.  No pneumothorax is noted. A small residual left pleural effusion is suspected No new infiltrates or signs of significant central peribronchial cuffing are seen.  Bony structures are intact.  IMPRESSION: Improved pulmonary vascular congestion with probable residual left lower lobe volume loss.  Lines and tubes as above.  Original Report Authenticated By: Bertha Stakes, M.D.    INR: Will add last result for INR, ABG once components are confirmed Will add last 4 CBG results once components are confirmed  Assessment/Plan: S/P Procedure(s) (LRB): CORONARY ARTERY BYPASS GRAFTING (CABG) (N/A) ENDARTERECTOMY CAROTID (Left)  1. Stable, and progressing nicely 2. Labs stable 3 pulm toilet/rehab 4 cont pacer for now may have to adjust amio/betablocker 5 gentle diuresis, with small effusions  LOS: 13 days    GOLD,WAYNE E 8/2/20138:12 AM    patient examined and medical record reviewed,agree with above note. VAN TRIGT III,PETER 01/26/2012

## 2012-01-26 NOTE — Progress Notes (Signed)
Patient's son, Casimiro Needle, called and CSW spoke with him at length regarding d/c plans. SNF bed offers discussed- [facility] is near the extended stay center where they have been residing for about a month while son looks for job and getting deposits for power, rent, etc.  CSW to follow up on Monday to further plan d/c to SNF.  Reece Levy, MSW, [facility] [phone]

## 2012-01-26 NOTE — Progress Notes (Signed)
Subjective:  Up in chair,O2 98 after respiratory Rx  Objective:  Vital Signs in the last 24 hours: Temp:  [97.4 F (36.3 C)-99.3 F (37.4 C)] 99.3 F (37.4 C) (08/02 0520) Pulse Rate:  [63-88] 88  (08/02 0520) Resp:  [15-26] 17  (08/02 0520) BP: (91-122)/(46-68) 103/56 mmHg (08/02 0520) SpO2:  [96 %-99 %] 98 % (08/02 0913) Weight:  [67 kg (147 lb 11.3 oz)] 67 kg (147 lb 11.3 oz) (08/02 0520)  Intake/Output from previous day:  Intake/Output Summary (Last 24 hours) at 01/26/12 0936 Last data filed at 01/26/12 0900  Gross per 24 hour  Intake    730 ml  Output   3022 ml  Net  -2292 ml    Physical Exam: General appearance: alert, cooperative and no distress Lungs: decreased breath sounds Heart: regular rate and rhythm   Rate: 75  Rhythm: paced at 75, underlying SB 50-60  Lab Results:  Basename 01/26/12 0535 01/25/12 0450  WBC 8.7 8.3  HGB 9.5* 9.5*  PLT 96* 86*    Basename 01/26/12 0535 01/25/12 0450  NA 140 140  K 4.0 4.2  CL 101 102  CO2 27 27  GLUCOSE 145* 93  BUN 19 20  CREATININE 1.17 1.16   No results found for this basename: TROPONINI:2,CK,MB:2 in the last 72 hours Hepatic Function Panel No results found for this basename: PROT,ALBUMIN,AST,ALT,ALKPHOS,BILITOT,BILIDIR,IBILI in the last 72 hours No results found for this basename: CHOL in the last 72 hours No results found for this basename: INR in the last 72 hours  Imaging: Imaging results have been reviewed  Cardiac Studies:  Assessment/Plan:   Principal Problem:  *NSTEMI (non-ST elevated myocardial infarction) Active Problems:  CAD (coronary artery disease), 3-V at cath 01/16/12  S/P CABG x 3     01/23/12  S/P carotid endarterectomy   Lt, 01/23/12  H/O ascending aorta repair  Thrombocytopenia  Left ventricular apical thrombus following MI, NSTEMI, 01/13/12  DM (diabetes mellitus)  COPD (chronic obstructive pulmonary disease)  Acute renal insufficiency, SCr up to 1.43  Ischemic cardiomyopathy,  EF 40-45% 2D 01/15/12  Carotid arterial disease, severe LICA stenosis  HTN (hypertension)  Tobacco abuse  Staghorn renal calculus  History of stroke   Plan- He is on Amio and Lopressor being paced at 75 with underlying NSR. Consider stopping beta blocker.   Corine Shelter PA-C 01/26/2012, 9:36 AM  I have seen and examined the patient along with Corine Shelter PA-C.  I have reviewed the chart, notes and new data.  I agree with PA's note.  Key new complaints: saphenectomy incision R thigh is sore, otherwise well Key examination changes: has NSR at 63-65 bpm now, while alert Key new findings / data: no AF  PLAN: Reduce negative chronotropic agents. May be ready for inpatient rehab soon. I don't think he will need a permanent pacemaker.  Thurmon Fair, MD, [facility] [phone] 01/26/2012, 1:02 PM

## 2012-01-27 MED ORDER — LEVALBUTEROL HCL 1.25 MG/0.5ML IN NEBU
1.2500 mg | INHALATION_SOLUTION | Freq: Two times a day (BID) | RESPIRATORY_TRACT | Status: DC
  Administered 2012-01-27 – 2012-01-28 (×2): 1.25 mg via RESPIRATORY_TRACT
  Filled 2012-01-27 (×4): qty 0.5

## 2012-01-27 NOTE — Progress Notes (Signed)
  Amiodarone Drug - Drug Interaction Consult Note  Recommendations: No changes needed at this time - simply monitor for muscle pain or weakness.  Amiodarone is metabolized by the cytochrome P450 system and therefore has the potential to cause many drug interactions. Amiodarone has an average plasma half-life of 50 days (range 20 to 100 days).   There is potential for drug interactions to occur several weeks or months after stopping treatment and the onset of drug interactions may be slow after initiating amiodarone.   [x]  Statins: Increased risk of myopathy. Simvastatin- restrict dose to 20mg  daily. Other statins: counsel patients to report any muscle pain or weakness immediately.   Thank You,  Eupha Lobb, Hilario Quarry , PharmD, BCPS, pager [phone] 01/27/2012 11:13 AM

## 2012-01-27 NOTE — Progress Notes (Signed)
CARDIAC REHAB PHASE I   PRE:  Rate/Rhythm: 71 SR  BP:  Supine:   Sitting: 129/66  Standing:    SaO2: 95 RA  MODE:  Ambulation: 150 ft   POST:  Rate/Rhythem: 79 SR  BP:  Supine:   Sitting: 126/51  Standing:    SaO2: 100 RA  Hasson Gaspard Taylor  Pt ambulated 150 ft assist x 2 with rolling walker. Tolerated well, slow pace but no rest breaks. Returned to [employer] VSS. Will follow up. Electronically signed by Harriett Sine MS on Saturday January 27 2012 at 1254

## 2012-01-27 NOTE — Progress Notes (Signed)
Subjective:  No CP/SOB  Objective:  Temp:  [97.3 F (36.3 C)-98.4 F (36.9 C)] 98.4 F (36.9 C) (08/03 0300) Pulse Rate:  [62-78] 78  (08/03 0300) Resp:  [16-18] 18  (08/03 0300) BP: (105-137)/(55-66) 137/55 mmHg (08/03 0300) SpO2:  [97 %-100 %] 100 % (08/03 0300) Weight:  [66.6 kg (146 lb 13.2 oz)] 66.6 kg (146 lb 13.2 oz) (08/03 0300) Weight change: -0.4 kg (-14.1 oz)  Intake/Output from previous day: 08/02 0701 - 08/03 0700 In: 720 [P.O.:720] Out: 2525 [Urine:2525]  Intake/Output from this shift: Total I/O In: -  Out: 100 [Urine:100]  Physical Exam: General appearance: alert, cooperative, appears stated age and no distress Neck: no adenopathy, no carotid bruit, no JVD, supple, symmetrical, trachea midline, thyroid not enlarged, symmetric, no tenderness/mass/nodules and LCEA incision healing Lungs: clear to auscultation bilaterally Heart: regular rate and rhythm, S1, S2 normal, no murmur, click, rub or gallop Extremities: extremities normal, atraumatic, no cyanosis or edema  Lab Results: Results for orders placed during the hospital encounter of 01/13/12 (from the past 48 hour(s))  GLUCOSE, CAPILLARY     Status: Abnormal   Collection Time   01/25/12 11:35 AM      Component Value Range Comment   Glucose-Capillary 132 (*) 70 - 99 mg/dL    Comment 1 Documented in Chart      Comment 2 Notify RN     GLUCOSE, CAPILLARY     Status: Abnormal   Collection Time   01/25/12  4:24 PM      Component Value Range Comment   Glucose-Capillary 151 (*) 70 - 99 mg/dL    Comment 1 Notify RN     GLUCOSE, CAPILLARY     Status: Abnormal   Collection Time   01/25/12  8:45 PM      Component Value Range Comment   Glucose-Capillary 148 (*) 70 - 99 mg/dL    Comment 1 Notify RN     CBC     Status: Abnormal   Collection Time   01/26/12  5:35 AM      Component Value Range Comment   WBC 8.7  4.0 - 10.5 K/uL    RBC 2.93 (*) 4.22 - 5.81 MIL/uL    Hemoglobin 9.5 (*) 13.0 - 17.0 g/dL    HCT 40.9 (*)  81.1 - 52.0 %    MCV 96.2  78.0 - 100.0 fL    MCH 32.4  26.0 - 34.0 pg    MCHC 33.7  30.0 - 36.0 g/dL    RDW 91.4  78.2 - 95.6 %    Platelets 96 (*) 150 - 400 K/uL CONSISTENT WITH PREVIOUS RESULT  BASIC METABOLIC PANEL     Status: Abnormal   Collection Time   01/26/12  5:35 AM      Component Value Range Comment   Sodium 140  135 - 145 mEq/L    Potassium 4.0  3.5 - 5.1 mEq/L    Chloride 101  96 - 112 mEq/L    CO2 27  19 - 32 mEq/L    Glucose, Bld 145 (*) 70 - 99 mg/dL    BUN 19  6 - 23 mg/dL    Creatinine, Ser 2.13  0.50 - 1.35 mg/dL    Calcium 9.0  8.4 - 08.6 mg/dL    GFR calc non Af Amer 61 (*) >90 mL/min    GFR calc Af Amer 71 (*) >90 mL/min   GLUCOSE, CAPILLARY     Status: Abnormal   Collection  Time   01/26/12  6:18 AM      Component Value Range Comment   Glucose-Capillary 127 (*) 70 - 99 mg/dL   GLUCOSE, CAPILLARY     Status: Abnormal   Collection Time   01/26/12 11:12 AM      Component Value Range Comment   Glucose-Capillary 173 (*) 70 - 99 mg/dL   GLUCOSE, CAPILLARY     Status: Abnormal   Collection Time   01/26/12  4:33 PM      Component Value Range Comment   Glucose-Capillary 173 (*) 70 - 99 mg/dL   GLUCOSE, CAPILLARY     Status: Abnormal   Collection Time   01/26/12  9:40 PM      Component Value Range Comment   Glucose-Capillary 160 (*) 70 - 99 mg/dL   GLUCOSE, CAPILLARY     Status: Abnormal   Collection Time   01/27/12  5:58 AM      Component Value Range Comment   Glucose-Capillary 136 (*) 70 - 99 mg/dL     Imaging: Imaging results have been reviewed  Assessment/Plan:   1. Principal Problem: 2.  *NSTEMI (non-ST elevated myocardial infarction) 3. Active Problems: 4.  H/O ascending aorta repair 5.  HTN (hypertension) 6.  Thrombocytopenia 7.  Tobacco abuse 8.  Staghorn renal calculus 9.  Left ventricular apical thrombus following MI, NSTEMI, 01/13/12 10.  DM (diabetes mellitus) 11.  CAD (coronary artery disease), 3-V at cath 01/16/12 12.  COPD (chronic  obstructive pulmonary disease) 13.  Acute renal insufficiency, SCr up to 1.43 14.  Ischemic cardiomyopathy, EF 40-45% 2D 01/15/12 15.  Carotid arterial disease, severe LICA stenosis 16.  History of stroke 55.  S/P CABG x 3     01/23/12 18.  S/P carotid endarterectomy   Lt, 01/23/12 19.   Time Spent Directly with Patient:  20 minutes  Length of Stay:  LOS: 14 days   POD # 5 CABG X 3 and LCEA. Doing great. Ambulating. VSS. Labs OK. NSR. ? Needs OP  Rehab as transition to home. D/C per TCTS.  Runell Gess 01/27/2012, 8:19 AM

## 2012-01-27 NOTE — Progress Notes (Signed)
                    [address]            [place] [address]          [phone]     5 Days Post-Op Procedure(s) (LRB): CORONARY ARTERY BYPASS GRAFTING (CABG) (N/A) ENDARTERECTOMY CAROTID (Left)  Subjective: Comfortable, no complaints.  Objective: Vital signs in last 24 hours: Patient Vitals for the past 24 hrs:  BP Temp Temp src Pulse Resp SpO2 Weight  01/27/12 0911 - - - - - 98 % -  01/27/12 0300 137/55 mmHg 98.4 F (36.9 C) Oral 78  18  100 % 146 lb 13.2 oz (66.6 kg)  01/26/12 2109 124/66 mmHg 97.3 F (36.3 C) Oral - 16  98 % -  01/26/12 1332 105/62 mmHg 98.3 F (36.8 C) Oral 62  17  97 % -  01/26/12 1004 106/60 mmHg - - - - - -   Current Weight  01/27/12 146 lb 13.2 oz (66.6 kg)     Intake/Output from previous day: 08/02 0701 - 08/03 0700 In: 720 [P.O.:720] Out: 2525 [Urine:2525]    PHYSICAL EXAM:  Heart: RRR Lungs: clear Wound: clean and dry Extremities: trace LE edema    Lab Results: CBC: Basename 01/26/12 0535 01/25/12 0450  WBC 8.7 8.3  HGB 9.5* 9.5*  HCT 28.2* 26.6*  PLT 96* 86*   BMET:  Basename 01/26/12 0535 01/25/12 0450  NA 140 140  K 4.0 4.2  CL 101 102  CO2 27 27  GLUCOSE 145* 93  BUN 19 20  CREATININE 1.17 1.16  CALCIUM 9.0 9.0    PT/INR: No results found for this basename: LABPROT,INR in the last 72 hours    Assessment/Plan: S/P Procedure(s) (LRB): CORONARY ARTERY BYPASS GRAFTING (CABG) (N/A) ENDARTERECTOMY CAROTID (Left)  CV- stable, SR.  Continue current care.  Vol overload- continue diuresis.  Nutrition- tolerating D2 diet.  Deconditioning- continue PT/CRPI.  Disp- SNF at d/c.  Hopefully ready by the first of next week.   LOS: 14 days    Charles Hatfield H 01/27/2012

## 2012-01-28 LAB — GLUCOSE, CAPILLARY
Glucose-Capillary: 135 mg/dL — ABNORMAL HIGH (ref 70–99)
Glucose-Capillary: 121 mg/dL — ABNORMAL HIGH (ref 70–99)

## 2012-01-28 MED ORDER — METOPROLOL SUCCINATE 12.5 MG HALF TABLET: 12.5000 mg | ORAL_TABLET | Freq: Every day | ORAL | Status: DC

## 2012-01-28 MED ORDER — FUROSEMIDE 40 MG PO TABS: 40.0000 mg | ORAL_TABLET | Freq: Every day | ORAL | Status: DC

## 2012-01-28 MED ORDER — TRAMADOL HCL 50 MG PO TABS: 50.0000 mg | ORAL_TABLET | Freq: Four times a day (QID) | ORAL | Status: DC | PRN

## 2012-01-28 MED ORDER — FLUTICASONE-SALMETEROL 250-50 MCG/DOSE IN AEPB: 1.0000 | INHALATION_SPRAY | Freq: Two times a day (BID) | RESPIRATORY_TRACT | Status: DC

## 2012-01-28 MED ORDER — STARCH (THICKENING) PO POWD: ORAL | Status: DC

## 2012-01-28 MED ORDER — ATORVASTATIN CALCIUM 20 MG PO TABS: 20.0000 mg | ORAL_TABLET | Freq: Every day | ORAL | Status: DC

## 2012-01-28 MED ORDER — ENSURE COMPLETE PO LIQD: 237.0000 mL | Freq: Two times a day (BID) | ORAL | Status: DC

## 2012-01-28 MED ORDER — AMIODARONE HCL 200 MG PO TABS: 200.0000 mg | ORAL_TABLET | Freq: Two times a day (BID) | ORAL | Status: DC

## 2012-01-28 NOTE — Progress Notes (Signed)
Subjective:  No CP/SOB  Objective:  Temp:  [97.1 F (36.2 C)-98.2 F (36.8 C)] 97.6 F (36.4 C) (08/04 0449) Pulse Rate:  [62-75] 64  (08/04 0449) Resp:  [18] 18  (08/04 0449) BP: (110-119)/(47-66) 119/63 mmHg (08/04 0449) SpO2:  [96 %-98 %] 97 % (08/04 0449) Weight:  [65.318 kg (144 lb)] 65.318 kg (144 lb) (08/04 0550) Weight change: -1.282 kg (-2 lb 13.2 oz)  Intake/Output from previous day: 08/03 0701 - 08/04 0700 In: 960 [P.O.:960] Out: 2975 [Urine:2975]  Intake/Output from this shift:    Physical Exam: General appearance: alert, cooperative, appears stated age and no distress Neck: no adenopathy, no carotid bruit, no JVD, supple, symmetrical, trachea midline and thyroid not enlarged, symmetric, no tenderness/mass/nodules Lungs: clear to auscultation bilaterally Heart: regular rate and rhythm, S1, S2 normal, no murmur, click, rub or gallop Extremities: extremities normal, atraumatic, no cyanosis or edema  Lab Results: Results for orders placed during the hospital encounter of 01/13/12 (from the past 48 hour(s))  GLUCOSE, CAPILLARY     Status: Abnormal   Collection Time   01/26/12 11:12 AM      Component Value Range Comment   Glucose-Capillary 173 (*) 70 - 99 mg/dL   GLUCOSE, CAPILLARY     Status: Abnormal   Collection Time   01/26/12  4:33 PM      Component Value Range Comment   Glucose-Capillary 173 (*) 70 - 99 mg/dL   GLUCOSE, CAPILLARY     Status: Abnormal   Collection Time   01/26/12  9:40 PM      Component Value Range Comment   Glucose-Capillary 160 (*) 70 - 99 mg/dL   GLUCOSE, CAPILLARY     Status: Abnormal   Collection Time   01/27/12  5:58 AM      Component Value Range Comment   Glucose-Capillary 136 (*) 70 - 99 mg/dL   GLUCOSE, CAPILLARY     Status: Abnormal   Collection Time   01/27/12 11:10 AM      Component Value Range Comment   Glucose-Capillary 168 (*) 70 - 99 mg/dL   GLUCOSE, CAPILLARY     Status: Abnormal   Collection Time   01/27/12  4:07 PM   Component Value Range Comment   Glucose-Capillary 141 (*) 70 - 99 mg/dL   GLUCOSE, CAPILLARY     Status: Abnormal   Collection Time   01/27/12  9:08 PM      Component Value Range Comment   Glucose-Capillary 191 (*) 70 - 99 mg/dL     Imaging: Imaging results have been reviewed  Assessment/Plan:   1. Principal Problem: 2.  *NSTEMI (non-ST elevated myocardial infarction) 3. Active Problems: 4.  H/O ascending aorta repair 5.  HTN (hypertension) 6.  Thrombocytopenia 7.  Tobacco abuse 8.  Staghorn renal calculus 9.  Left ventricular apical thrombus following MI, NSTEMI, 01/13/12 10.  DM (diabetes mellitus) 11.  CAD (coronary artery disease), 3-V at cath 01/16/12 12.  COPD (chronic obstructive pulmonary disease) 13.  Acute renal insufficiency, SCr up to 1.43 14.  Ischemic cardiomyopathy, EF 40-45% 2D 01/15/12 15.  Carotid arterial disease, severe LICA stenosis 16.  History of stroke 82.  S/P CABG x 3     01/23/12 18.  S/P carotid endarterectomy   Lt, 01/23/12 19.   Time Spent Directly with Patient:  20 minutes  Length of Stay:  LOS: 15 days   POD # 6 CABG X 3 and LCEA. Doing great!!! Ambulating w/o difficulty. NSR. Labs OK.  Appears euvolemic.  Agree with prob short term SNF. Disposition per TCTS.  Runell Gess 01/28/2012, 7:38 AM

## 2012-01-28 NOTE — Discharge Summary (Signed)
[address]            [place] [address]          [phone]         Discharge Summary  Name: Charles Hatfield DOB: [birth year] 71 y.o. MRN: [id number]   Admission Date: 01/13/2012 Discharge Date: 01/29/2012    Admitting Diagnosis:  Chest pain   Discharge Diagnosis:   Non ST segment elevation myocardial infarction  Severe 3 vessel coronary artery disease  Left inferoapical thrombus  Severe left internal carotid stenosis  60-79% right internal carotid stenosis  Severe left ventricular dysfunction  Expected postoperative blood loss anemia  Postoperative thrombocytopenia  History of peripheral vascular disease, s/p aortobiiliac bypass  COPD   Past Medical History  Diagnosis Date  . Hypertension   . Diabetes mellitus   . H/O ascending aorta repair   . Stroke   . Hematuria 01/15/2012  . Staghorn renal calculus 01/15/2012  . Left ventricular apical thrombus following MI, NSTEMI, 01/13/12 01/15/2012  . DM (diabetes mellitus) 01/15/2012   Procedures: Procedure(s):  CORONARY ARTERY BYPASS GRAFTING x 3 (left internal mammary artery to LAD, saphenous vein graft to second diagonal, saphenous vein graft to posterolateral artery), endoscopic vein harvest right leg  LEFT CAROTID ENDARTERECTOMY  - 01/22/2012   HPI:  The patient is a 71 y.o. male with a past medical history significant for coronary artery disease, stroke and peripheral arterial disease. Reportedly he had a stent placed in the coronary arteries around 1999. He presents with a 4 week history of recurrent chest pain radiating to the upper abdomen, severe shortness of breath on exertion, and near syncope on exertion. He also had some severe nausea and vomiting roughly 2 weeks ago, possibly with some hematemesis. He has refused to seek medical care, despite his son's recommendation, until his condition worsened over the 24 hours prior to admission. He had severe chest  discomfort at that time that was immediately relieved by sublingual nitroglycerin, but quickly recurred. He presented to the [facility] ER, and was found to have elevation of his cardiac enzymes with a normal EKG, consistent with non-ST elevation MI.  He was admitted for evaluation and treatment.    Hospital Course:  The patient was admitted to [facility] on 01/13/2012 . He was started on heparin, but developed hematuria, and his heparin was discontinued.  A CT showed an unchanged left renal calculus. A urology consult was obtained, and it was felt that the stone should be monitored and treated conservatively at this point. He was seen by cardiology, and catheterization was performed on 01/16/2012 by Dr. Royann Shivers.  This showed severe three vessel coronary artery disease, which was not felt to be amenable to percutaneous intervention.  A cardiac surgery consult was requested, and the patient was seen by Dr. Kathlee Nations Trigt, who agreed with the need for surgical revascularization. In the course of his preoperative workup, the patient underwent carotid Dopplers, which showed a severe left carotid stenosis of greater than 90%. A vascular surgery consult was obtained in light of the patient's history of previous stroke. Dr. Waverly Ferrari saw the patient and reviewed his films.  A CT angiogram was ordered to further evaluate the carotid plaque. This revealed a focal stenosis on the left which was surgically accessible. Plans were made for a combined left carotid endarterectomy and coronary artery bypass grafting. All risks, benefits and  alternatives of surgery were explained in detail, and the patient agreed to proceed. The patient was taken to the operating room and underwent the above procedure.    The postoperative course has generally been uneventful.  His cardiac status has remained stable.  His blood pressures have been low normal, and he has been started on a beta blocker. He has not been started on an ACE  inhibitor due to low systolic blood pressures, however this may be reevaluated as an outpatient. He is maintaining sinus rhythm.  He has been diuresed for volume overload.  His neurologic status has remained stable. Because of his prior stroke history, he was seen by physical therapy for deconditioning.  He is improving with mobility.  He was also evaluated by speech therapy and placed on a dysphagia 2 diet, which he is tolerating well, with no signs of aspiration. His incisions are healing well. He will require short term placement in a skilled nursing facility at discharge.  He is currently medically stable for discharge once a bed is available.     Recent vital signs:  Filed Vitals:   01/28/12 0449  BP: 119/63  Pulse: 64  Temp: 97.6 F (36.4 C)  Resp: 18    Recent laboratory studies:  CBC: Basename 01/26/12 0535  WBC 8.7  HGB 9.5*  HCT 28.2*  PLT 96*   BMET:  Basename 01/26/12 0535  NA 140  K 4.0  CL 101  CO2 27  GLUCOSE 145*  BUN 19  CREATININE 1.17  CALCIUM 9.0    PT/INR: No results found for this basename: LABPROT,INR in the last 72 hours   Discharge Medications:   Medication List  As of 01/29/2012  3:52 PM   TAKE these medications         amiodarone 200 MG tablet   Commonly known as: PACERONE   Take 1 tablet (200 mg total) by mouth daily.      aspirin 325 MG tablet   Take 325 mg by mouth daily.      atorvastatin 20 MG tablet   Commonly known as: LIPITOR   Take 1 tablet (20 mg total) by mouth daily at 6 PM.      feeding supplement Liqd   Take 237 mLs by mouth 2 (two) times daily between meals.      Fluticasone-Salmeterol 250-50 MCG/DOSE Aepb   Commonly known as: ADVAIR   Inhale 1 puff into the lungs 2 (two) times daily.      food thickener Powd   Commonly known as: THICK IT   Use as needed      furosemide 40 MG tablet   Commonly known as: LASIX   Take 1 tablet (40 mg total) by mouth daily. For 4 days then stop.      traMADol 50 MG tablet    Commonly known as: ULTRAM   Take 1 tablet (50 mg total) by mouth every 6 (six) hours as needed for pain.           Please note the patient was not discharged on an ACE or ARB as his blood pressure would not tolerate this. We will try to start as an outpatient as he is s/p NSTEMI.His BB was discontinued secondary to HR in the 60's and SBP in the 90's.   Discharge Instructions:  The patient is to refrain from driving, heavy lifting or strenuous activity.  May shower daily and clean incisions with soap and water.  May continue dysphagia 2 diet with thin  liquids.   Follow Up: Follow-up Information    Follow up with EARLY, TODD, MD. Schedule an appointment as soon as possible for a visit in 2 weeks.   Contact information:   [address] [place] [address] [phone]       Follow up with VAN Dinah Beers, MD in 2 weeks. (Office will schedule appointment)    Contact information:   [address] [place] [address] [phone]       Follow up with Thurmon Fair, MD. Schedule an appointment as soon as possible for a visit in 2 weeks.   Contact information:   [address] [place] [address] [phone]              COLLINS,GINA H 01/28/2012, 10:45 AM

## 2012-01-28 NOTE — Progress Notes (Addendum)
                    [address]            [place] [address]          [phone]     6 Days Post-Op Procedure(s) (LRB): CORONARY ARTERY BYPASS GRAFTING (CABG) (N/A) ENDARTERECTOMY CAROTID (Left)  Subjective: Feels well, no complaints.  Objective: Vital signs in last 24 hours: Patient Vitals for the past 24 hrs:  BP Temp Temp src Pulse Resp SpO2 Weight  01/28/12 0550 - - - - - - 144 lb (65.318 kg)  01/28/12 0449 119/63 mmHg 97.6 F (36.4 C) Oral 64  18  97 % -  01/27/12 2108 110/66 mmHg 97.1 F (36.2 C) Oral 62  18  96 % -  01/27/12 1948 - - - 67  18  98 % -  01/27/12 1403 114/47 mmHg 98.2 F (36.8 C) - 75  18  98 % -  01/27/12 0911 - - - - - 98 % -   Current Weight  01/28/12 144 lb (65.318 kg)     Intake/Output from previous day: 08/03 0701 - 08/04 0700 In: 960 [P.O.:960] Out: 2975 [Urine:2975]    PHYSICAL EXAM:  Heart:RRR Lungs: clear Wound: clean and dry Extremities: trace LE edema    Lab Results: CBC: Basename 01/26/12 0535  WBC 8.7  HGB 9.5*  HCT 28.2*  PLT 96*   BMET:  Basename 01/26/12 0535  NA 140  K 4.0  CL 101  CO2 27  GLUCOSE 145*  BUN 19  CREATININE 1.17  CALCIUM 9.0    PT/INR: No results found for this basename: LABPROT,INR in the last 72 hours    Assessment/Plan: S/P Procedure(s) (LRB): CORONARY ARTERY BYPASS GRAFTING (CABG) (N/A) ENDARTERECTOMY CAROTID (Left) CV- stable, SR. Continue current care.  Vol overload- continue diuresis.  Nutrition- tolerating D2 diet.  Deconditioning- continue PT/CRPI.  Disp- family deciding on SNF.  Hopefully medically ready in next 1-2 days.     LOS: 15 days    COLLINS,GINA H 01/28/2012   patient examined and medical record reviewed,agree with above note. Ready for SNF VAN TRIGT III,Griffin Gerrard 01/28/2012

## 2012-01-29 LAB — CBC
HCT: 33 % — ABNORMAL LOW (ref 39.0–52.0)
Hemoglobin: 11 g/dL — ABNORMAL LOW (ref 13.0–17.0)
MCH: 32.4 pg (ref 26.0–34.0)
MCHC: 33.3 g/dL (ref 30.0–36.0)
MCV: 97.3 fL (ref 78.0–100.0)
Platelets: 209 10*3/uL (ref 150–400)
RBC: 3.39 MIL/uL — ABNORMAL LOW (ref 4.22–5.81)
RDW: 14.9 % (ref 11.5–15.5)
WBC: 11.4 10*3/uL — ABNORMAL HIGH (ref 4.0–10.5)

## 2012-01-29 LAB — BASIC METABOLIC PANEL
BUN: 24 mg/dL — ABNORMAL HIGH (ref 6–23)
CO2: 22 mEq/L (ref 19–32)
Calcium: 9.6 mg/dL (ref 8.4–10.5)
Chloride: 97 mEq/L (ref 96–112)
Creatinine, Ser: 1.28 mg/dL (ref 0.50–1.35)
GFR calc Af Amer: 64 mL/min — ABNORMAL LOW (ref >90)
GFR calc non Af Amer: 55 mL/min — ABNORMAL LOW (ref >90)
Glucose, Bld: 141 mg/dL — ABNORMAL HIGH (ref 70–99)
Potassium: 4.3 mEq/L (ref 3.5–5.1)
Sodium: 136 mEq/L (ref 135–145)

## 2012-01-29 LAB — GLUCOSE, CAPILLARY
Glucose-Capillary: 151 mg/dL — ABNORMAL HIGH (ref 70–99)
Glucose-Capillary: 159 mg/dL — ABNORMAL HIGH (ref 70–99)

## 2012-01-29 MED ORDER — AMIODARONE HCL 200 MG PO TABS: 200.0000 mg | ORAL_TABLET | Freq: Every day | ORAL | Status: DC

## 2012-01-29 MED ORDER — FUROSEMIDE 40 MG PO TABS: 40.0000 mg | ORAL_TABLET | Freq: Every day | ORAL | Status: DC

## 2012-01-29 MED ORDER — TRAMADOL HCL 50 MG PO TABS: 50.0000 mg | ORAL_TABLET | Freq: Four times a day (QID) | ORAL | Status: AC | PRN

## 2012-01-29 NOTE — Progress Notes (Signed)
Ed completed. Pt requests his name be sent to [facility].  [id number] Haig Prophet ReeveCES, ACSM

## 2012-01-29 NOTE — Progress Notes (Signed)
Subjective:  No complaints.  Objective:  Vital Signs in the last 24 hours: Temp:  [97.5 F (36.4 C)-97.6 F (36.4 C)] 97.5 F (36.4 C) (08/05 0515) Pulse Rate:  [67-68] 68  (08/05 0515) Resp:  [18] 18  (08/05 0515) BP: (111-116)/(61-69) 116/69 mmHg (08/05 0515) SpO2:  [97 %-98 %] 98 % (08/05 0515) Weight:  [66.6 kg (146 lb 13.2 oz)] 66.6 kg (146 lb 13.2 oz) (08/05 0515)  Intake/Output from previous day:  Intake/Output Summary (Last 24 hours) at 01/29/12 0916 Last data filed at 01/29/12 0516  Gross per 24 hour  Intake    600 ml  Output   1601 ml  Net  -1001 ml    Physical Exam: General appearance: alert, cooperative and no distress Lungs: clear to auscultation bilaterally, non-labored Heart: regular rate and rhythm, soft 1/6 HSM @ LLSB/Apex Abd: soft, NT/ND, NABS Extr: no c/c/e   Rate: 60  Rhythm: normal sinus rhythm and PACs and ? some junctional beats.  Lab Results:  Basename 01/29/12 0644  WBC 11.4*  HGB 11.0*  PLT 209    Basename 01/29/12 0644  NA 136  K 4.3  CL 97  CO2 22  GLUCOSE 141*  BUN 24*  CREATININE 1.28   No results found for this basename: TROPONINI:2,CK,MB:2 in the last 72 hours Hepatic Function Panel No results found for this basename: PROT,ALBUMIN,AST,ALT,ALKPHOS,BILITOT,BILIDIR,IBILI in the last 72 hours No results found for this basename: CHOL in the last 72 hours No results found for this basename: INR in the last 72 hours  Imaging: Imaging results have been reviewed  Cardiac Studies:  Assessment/Plan:   Principal Problem:  *NSTEMI (non-ST elevated myocardial infarction) Active Problems:  CAD (coronary artery disease), 3-V at cath 01/16/12  S/P CABG x 3     01/23/12  S/P carotid endarterectomy   Lt, 01/23/12  H/O ascending aorta repair  Thrombocytopenia  Left ventricular apical thrombus following MI, NSTEMI, 01/13/12  DM (diabetes mellitus)  COPD (chronic obstructive pulmonary disease)  Acute renal insufficiency, SCr up to  1.43  Ischemic cardiomyopathy, EF 40-45% 2D 01/15/12  Carotid arterial disease, severe LICA stenosis  HTN (hypertension)  Tobacco abuse  Staghorn renal calculus  History of stroke  Plan- Will discuss with MD- Dr Rennis Golden had suggested repeating his echo to R/O LV thrombus seen on TTE but not TEE. Also, does he need Amio ?- I'm not sure he had had AF and he is slow now. If he needs Amio would decrease dose to 200mg  daily at discharge.   Corine Shelter PA-C 01/29/2012, 9:16 AM  ATTENDING ATTESTATION:  I have seen and examined the patient along with Corine Shelter, PA.  I have reviewed the chart, notes and new data.  I agree with Lukes's note.  Brief Description: s/p combined CABG & L-CEA for NSTEMI & a noted carotid disease.  Low BPs have precluded ACE-I & increasing BB dosing.   BP today is only in ~90s.  Pre-CABG 2D Echo suggested possible apical thrombus - not seen intra-op.   I agree with Leane Call exam.   PLAN:  Stable for d/c from cardiac standpoint -- only sticking point is the ? Of a contrast 2D Echo.  Not currently on coumadin -- would plan for OP echo with contrast done prior to Cardiology F/U.  Agree with once daily Amiodarone dosing - was more prophylactic.  If low BP is a concern along with borderline bradycardia, may need to hold BB & having the BB effect of Amiodarone without BP  effect would be beneficial early on in recovery.  Can have BB dose up-titrated as OP along with ACE-I initiation..  Agree with short term SNF on d/c.  Also agree with low dose diuretic.  Marykay Lex, M.D., M.S. [facility] [address] [place], [place]  [address]  [phone]  01/29/2012 2:49 PM

## 2012-01-29 NOTE — Plan of Care (Signed)
Problem: Discharge Progression Outcomes Goal: Discharge plan in place and appropriate Outcome: Completed/Met Date Met:  01/29/12 Pt discharge to SNF

## 2012-01-29 NOTE — Progress Notes (Signed)
PT Cancellation Note  Treatment cancelled today due to pt states he wants to rest until they figure out if he is going to D/C today.  Will try another time.    Sunny Schlein, [place] [phone] 01/29/2012, 2:10 PM

## 2012-01-29 NOTE — Progress Notes (Addendum)
7 Days Post-Op Procedure(s) (LRB): CORONARY ARTERY BYPASS GRAFTING (CABG) (N/A) ENDARTERECTOMY CAROTID (Left)  Subjective: Eating breakfast this am and he has no complaints.  Objective: Vital signs in last 24 hours: Patient Vitals for the past 24 hrs:  BP Temp Temp src Pulse Resp SpO2 Weight  01/29/12 0515 116/69 mmHg 97.5 F (36.4 C) Oral 68  18  98 % 146 lb 13.2 oz (66.6 kg)  01/28/12 2118 112/64 mmHg 97.5 F (36.4 C) Oral 67  18  97 % -  01/28/12 1425 111/61 mmHg 97.6 F (36.4 C) - 68  18  97 % -  01/28/12 0812 - - - - - 97 % -   Pre op weight  64.9 kg Current Weight  01/29/12 146 lb 13.2 oz (66.6 kg)     Intake/Output from previous day: 08/04 0701 - 08/05 0700 In: 600 [P.O.:600] Out: 1601 [Urine:1600; Stool:1]   Physical Exam:  Cardiovascular: RRR, no murmurs, gallops, or rubs. Pulmonary: Clear to auscultation bilaterally; no rales, wheezes, or rhonchi. Abdomen: Soft, non tender, bowel sounds present. Extremities: Trace bilateral lower extremity edema. Wounds: Clean and dry.  No erythema or signs of infection.  Lab Results: CBC:No results found for this basename: WBC:2,HGB:2,HCT:2,PLT:2 in the last 72 hours BMET: No results found for this basename: NA:2,K:2,CL:2,CO2:2,GLUCOSE:2,BUN:2,CREATININE:2,CALCIUM:2 in the last 72 hours  PT/INR:  Lab Results  Component Value Date   INR 1.64* 01/22/2012   INR 1.15 01/16/2012   INR 1.11 01/15/2012   ABG:  INR: Will add last result for INR, ABG once components are confirmed Will add last 4 CBG results once components are confirmed  Assessment/Plan:  1. CV - SR/SB.Will decrease Amiodarone to 200 mg daily.Will hold Toprol XL this am if HR<  60. 2.  Pulmonary - Encourage incentive spirometer. 3. Volume Overload - Continue with diuresis. 4.  Acute blood loss anemia - H and H this am 11 and 33. 5.Await BMET results. 6.Thrombocytopenia resolved as platelets are up to 209,000. 7.To SNF soon.  Charles Hatfield  MPA-C 01/29/2012

## 2012-01-29 NOTE — Progress Notes (Signed)
CSW arranged d/c to [facility] for after hours admit.  Ppwk was sent to [facility].  CSW relayed the d/c information to pt and pt son, Casimiro Needle.  CSW relayed message from Eber Jones to son Casimiro Needle for admit instructions for after hours.  Son Casimiro Needle will transport pt to [facility].  D/c projection for 5:00pm-6:00pm per son's request. Vickii Penna, LCSWA [phone]  Clinical Social Work

## 2012-01-29 NOTE — Progress Notes (Signed)
CSW talked with son, Casimiro Needle who is assisting with d/c to SNF, [facility].  CSW to call [facility] to see protocol for admit (whether son needs to complete ppwk before d/c to SNF).  CSW will follow-up with son.  CSW discussed d/c times with RN.  RN conveyed that PA would like to stabilize meds before d/c is finalized.  CSW will relay this information to son and pt.  Vickii Penna, LCSWA [phone]  Clinical Social Work

## 2012-01-30 ENCOUNTER — Ambulatory Visit (INDEPENDENT_AMBULATORY_CARE_PROVIDER_SITE_OTHER): Payer: Self-pay | Admitting: Thoracic Surgery (Cardiothoracic Vascular Surgery)

## 2012-01-30 ENCOUNTER — Encounter: Payer: Self-pay | Admitting: Thoracic Surgery (Cardiothoracic Vascular Surgery)

## 2012-01-30 ENCOUNTER — Other Ambulatory Visit: Payer: Self-pay

## 2012-01-30 ENCOUNTER — Ambulatory Visit (HOSPITAL_COMMUNITY)
Admission: RE | Admit: 2012-01-30 | Discharge: 2012-01-30 | Disposition: A | Discharge status: Home / Self Care | Payer: Medicare Other | Source: Ambulatory Visit | Attending: Thoracic Surgery (Cardiothoracic Vascular Surgery) | Admitting: Thoracic Surgery (Cardiothoracic Vascular Surgery)

## 2012-01-30 VITALS — BP 143/74 | HR 81 | Temp 98.5°F | Resp 16 | Ht 66.0 in | Wt 145.0 lb

## 2012-01-30 DIAGNOSIS — I724 Aneurysm of artery of lower extremity: Secondary | ICD-10-CM

## 2012-01-30 DIAGNOSIS — J449 Chronic obstructive pulmonary disease, unspecified: Secondary | ICD-10-CM | POA: Diagnosis not present

## 2012-01-30 DIAGNOSIS — I2510 Atherosclerotic heart disease of native coronary artery without angina pectoris: Secondary | ICD-10-CM

## 2012-01-30 DIAGNOSIS — I059 Rheumatic mitral valve disease, unspecified: Secondary | ICD-10-CM | POA: Diagnosis not present

## 2012-01-30 DIAGNOSIS — Z8673 Personal history of transient ischemic attack (TIA), and cerebral infarction without residual deficits: Secondary | ICD-10-CM | POA: Diagnosis not present

## 2012-01-30 DIAGNOSIS — I10 Essential (primary) hypertension: Secondary | ICD-10-CM | POA: Diagnosis not present

## 2012-01-30 DIAGNOSIS — R079 Chest pain, unspecified: Secondary | ICD-10-CM | POA: Diagnosis not present

## 2012-01-30 DIAGNOSIS — Z87891 Personal history of nicotine dependence: Secondary | ICD-10-CM | POA: Diagnosis not present

## 2012-01-30 DIAGNOSIS — D62 Acute posthemorrhagic anemia: Secondary | ICD-10-CM | POA: Diagnosis not present

## 2012-01-30 DIAGNOSIS — Z951 Presence of aortocoronary bypass graft: Secondary | ICD-10-CM | POA: Diagnosis not present

## 2012-01-30 DIAGNOSIS — I749 Embolism and thrombosis of unspecified artery: Secondary | ICD-10-CM | POA: Insufficient documentation

## 2012-01-30 DIAGNOSIS — R0602 Shortness of breath: Secondary | ICD-10-CM | POA: Diagnosis not present

## 2012-01-30 DIAGNOSIS — Z5189 Encounter for other specified aftercare: Secondary | ICD-10-CM | POA: Diagnosis not present

## 2012-01-30 DIAGNOSIS — Z4889 Encounter for other specified surgical aftercare: Secondary | ICD-10-CM | POA: Diagnosis not present

## 2012-01-30 DIAGNOSIS — J90 Pleural effusion, not elsewhere classified: Secondary | ICD-10-CM | POA: Diagnosis not present

## 2012-01-30 DIAGNOSIS — I079 Rheumatic tricuspid valve disease, unspecified: Secondary | ICD-10-CM | POA: Diagnosis not present

## 2012-01-30 DIAGNOSIS — J9819 Other pulmonary collapse: Secondary | ICD-10-CM | POA: Diagnosis not present

## 2012-01-30 DIAGNOSIS — I6529 Occlusion and stenosis of unspecified carotid artery: Secondary | ICD-10-CM | POA: Diagnosis not present

## 2012-01-30 DIAGNOSIS — E119 Type 2 diabetes mellitus without complications: Secondary | ICD-10-CM | POA: Diagnosis not present

## 2012-01-30 DIAGNOSIS — R1900 Intra-abdominal and pelvic swelling, mass and lump, unspecified site: Secondary | ICD-10-CM | POA: Diagnosis not present

## 2012-01-30 DIAGNOSIS — Z09 Encounter for follow-up examination after completed treatment for conditions other than malignant neoplasm: Secondary | ICD-10-CM

## 2012-01-30 DIAGNOSIS — I214 Non-ST elevation (NSTEMI) myocardial infarction: Secondary | ICD-10-CM | POA: Diagnosis not present

## 2012-01-30 NOTE — Progress Notes (Signed)
VASCULAR LAB PRELIMINARY  PRELIMINARY  PRELIMINARY  PRELIMINARY  Right groin ultrasound completed.    Preliminary report:  No evidence of pseudoaneurysm or AV fistula.  Tasharra Nodine, RVT 01/30/2012, 6:35 PM

## 2012-01-30 NOTE — Progress Notes (Signed)
HPI:  [name] returns today for an unscheduled visit to assess whether to skilled nursing facility is still on appropriate need for him. He is a 71 year old gentleman with a history of hypertension, diabetes, previous a sitting aorta repair, and stroke. He had coronary bypass grafting x3 in the left carotid endarterectomy on 01/22/2012. Postoperatively he made slow but steady progress. He was evaluated by speech therapy and place on a dysphagia 2 diet. He was requiring physical therapy and ambulating with a walker.  He was felt ready for discharge to a skilled nursing facility yesterday. He was discharged relatively late in the day and his son took him home instead of taking him to [facility]. He did not realize the patient needed to go directly to the skilled nursing facility. He tried to take his father there today and was told that he needed to be reassessed..  Charles Hatfield states that he is not having any chest pain or shortness of breath. He is having pain in his right groin which started after his discharge from the hospital. He has had swelling in the groin since the time of his catheterization. He does not have any swelling in his legs.  Past Medical History  Diagnosis Date  . Hypertension   . Diabetes mellitus   . H/O ascending aorta repair   . Stroke   . Hematuria 01/15/2012  . Staghorn renal calculus 01/15/2012  . Left ventricular apical thrombus following MI, NSTEMI, 01/13/12 01/15/2012  . DM (diabetes mellitus) 01/15/2012      Current Outpatient Prescriptions  Medication Sig Dispense Refill  . amiodarone (PACERONE) 200 MG tablet Take 1 tablet (200 mg total) by mouth daily.  60 tablet    . aspirin 325 MG tablet Take 325 mg by mouth daily.      Marland Kitchen atorvastatin (LIPITOR) 20 MG tablet Take 1 tablet (20 mg total) by mouth daily at 6 PM.      . feeding supplement (ENSURE COMPLETE) LIQD Take 237 mLs by mouth 2 (two) times daily between meals.      . Fluticasone-Salmeterol (ADVAIR)  250-50 MCG/DOSE AEPB Inhale 1 puff into the lungs 2 (two) times daily.  60 each    . food thickener (THICK IT) POWD Use as needed      . furosemide (LASIX) 40 MG tablet Take 1 tablet (40 mg total) by mouth daily. For 4 days then stop.  30 tablet    . traMADol (ULTRAM) 50 MG tablet Take 1 tablet (50 mg total) by mouth every 6 (six) hours as needed for pain.  35 tablet  0   No current facility-administered medications for this visit.   Facility-Administered Medications Ordered in Other Visits  Medication Dose Route Frequency Provider Last Rate Last Dose  . DISCONTD: 0.9 %  sodium chloride infusion  250 mL Intravenous PRN Kerin Perna, MD      . DISCONTD: albuterol (PROVENTIL) (5 MG/ML) 0.5% nebulizer solution 2.5 mg  2.5 mg Nebulization Q2H PRN Rometta Emery, MD      . DISCONTD: ALPRAZolam Prudy Feeler) tablet 0.25-0.5 mg  0.25-0.5 mg Oral Q4H PRN Kerin Perna, MD   0.25 mg at 01/21/12 2357  . DISCONTD: amiodarone (PACERONE) tablet 200 mg  200 mg Oral BID Mihai Croitoru, MD   200 mg at 01/29/12 1029  . DISCONTD: aspirin chewable tablet 324 mg  324 mg Per Tube Daily Erin R Barrett, PA      . DISCONTD: aspirin EC tablet 325 mg  325  mg Oral Daily Erin R Barrett, PA   325 mg at 01/29/12 1029  . DISCONTD: atorvastatin (LIPITOR) tablet 20 mg  20 mg Oral q1800 Eda Paschal Huntley, [place]   20 mg at 01/29/12 1733  . DISCONTD: bisacodyl (DULCOLAX) EC tablet 10 mg  10 mg Oral Daily Erin R Barrett, PA   10 mg at 01/25/12 1030  . DISCONTD: bisacodyl (DULCOLAX) suppository 10 mg  10 mg Rectal Daily Erin R Barrett, PA      . DISCONTD: docusate sodium (COLACE) capsule 200 mg  200 mg Oral Daily Erin R Barrett, PA   200 mg at 01/29/12 1029  . DISCONTD: feeding supplement (ENSURE COMPLETE) liquid 237 mL  237 mL Oral BID BM Ailene Ards, RD   237 mL at 01/29/12 1000  . DISCONTD: Fluticasone-Salmeterol (ADVAIR) 250-50 MCG/DOSE inhaler 1 puff  1 puff Inhalation BID Kerin Perna, MD   1 puff at 01/29/12 [phone]  .  DISCONTD: food thickener (THICK IT) powder   Oral PRN Kerin Perna, MD      . DISCONTD: furosemide (LASIX) tablet 40 mg  40 mg Oral Daily Kerin Perna, MD   40 mg at 01/29/12 1029  . DISCONTD: insulin aspart (novoLOG) injection 0-24 Units  0-24 Units Subcutaneous TID AC & HS Kerin Perna, MD   3 Units at 01/29/12 1733  . DISCONTD: magnesium hydroxide (MILK OF MAGNESIA) suspension 15 mL  15 mL Oral Daily PRN Kerin Perna, MD      . DISCONTD: metoprolol succinate (TOPROL-XL) 24 hr tablet 12.5 mg  12.5 mg Oral Daily Mihai Croitoru, MD   12.5 mg at 01/29/12 1029  . DISCONTD: ondansetron (ZOFRAN) injection 4 mg  4 mg Intravenous Q6H PRN Erin R Barrett, PA      . DISCONTD: oxyCODONE (Oxy IR/ROXICODONE) immediate release tablet 5-10 mg  5-10 mg Oral Q3H PRN Erin R Barrett, PA   10 mg at 01/24/12 1351  . DISCONTD: pantoprazole (PROTONIX) EC tablet 40 mg  40 mg Oral QAC breakfast Kerin Perna, MD   40 mg at 01/29/12 9604  . DISCONTD: sodium chloride 0.9 % injection 3 mL  3 mL Intravenous Q12H Erin R Barrett, PA   3 mL at 01/28/12 0952  . DISCONTD: sodium chloride 0.9 % injection 3 mL  3 mL Intravenous PRN Erin R Barrett, PA      . DISCONTD: sodium chloride 0.9 % injection 3 mL  3 mL Intravenous Q12H Kerin Perna, MD   3 mL at 01/28/12 2153  . DISCONTD: sodium chloride 0.9 % injection 3 mL  3 mL Intravenous PRN Kerin Perna, MD      . DISCONTD: traMADol Janean Sark) tablet 50 mg  50 mg Oral Q6H PRN Kerin Perna, MD   50 mg at 01/28/12 2152    Physical Exam BP 143/74  Pulse 81  Temp 98.5 F (36.9 C) (Oral)  Resp 16  Ht 5\' 6"  (1.676 m)  Wt 145 lb (65.772 kg)  BMI 23.40 kg/m2  SpO18 21% 71 year old white male in no acute distress Sternum stable, incision clean dry and intact Lungs with equal breath sounds bilaterally Cardiac regular rate and rhythm no rubs Leg incisions healing well Right groin with ecchymosis and palpable mass. Nonpulsatile, but bruit present Peripheral  edema  Diagnostic Tests: none  Impression: 71 year old gentleman status post recent coronary bypass grafting and carotid endarterectomy. He was taken home instead of to the skilled nursing facility yesterday for reasons  that are not understood. He would likely benefit from an interval the skilled nursing facility to continued physical therapy and improvement of his deconditioning before he is discharged into the home environment.  He is complaining of increasing pain in his right groin he does have a palpable mass there but it is not pulsatile. He does have a bruit however. I think he should have a duplex of the groin to make sure that he does not have a pseudoaneurysm or AV fistula.  Plan: Return to skilled nursing facility.

## 2012-01-30 NOTE — Progress Notes (Signed)
CSW received a call from [place] at [facility] stating pt and son never made it to [facility].  CSW called all numbers listed for son, Casimiro Needle and pt without reaching anyone.  There was no chance for a msg to be left.  CSW has called [facility] where the pt lives with his son.  The front desk transferred me to his room where I left a message for pt and son to contact this CSW.  CSW consulted with RNCW and will consult with other CSWs on how to proceed.   Vickii Penna, LCSWA [phone]  Clinical Social Work

## 2012-01-31 NOTE — Progress Notes (Signed)
CSW received call from SNF, [facility] stating that pt was never rcvd at [facility].  Son, Casimiro Needle p/u pt from [facility] around approx 6pm and did not take pt to SNF.  Pt is on meds from recent CABG and needs constant supervision.  CSW has called son's cell phone number and [facility] (where the pt has been residing).  After approx 2 hours of calling, CSW rcvd call from son, Casimiro Needle who stated that his dad wanted to "go see some people" before he went to [facility].  After the stops were made the dad thought it was too late to go to SNF so went to [facility] instead.  [facility] never recvd a phone call and neither did CSW regarding this change of plans. CSW and RNCM worked diligently with Mr. Vasques, [facility] and Dr. Zenaida Niece Trigt's office to get pt medically ready to be admitted into [facility].  [facility] wouldn't accept pt until he was seen and signed off on by a physician regarding his medical stability since he had been 24 hours without crucial meds.  CSW and RNCM arranged with Dr. Zenaida Niece Trigt's office for pt to be seen and medically evaluated in office.  CSW talked to son Casimiro Needle at 4:30pm who was in route to MD office.  CSW relayed plans to [facility] of another after hours d/c.  [facility] was agreeable to these plans as long as the son followed through.  CSW f/u with [facility] to confirm pt admission.  Pt was sent to ED for ultrasound due to knot in groin and then sent to [facility].  Per Jasmine December at [facility] pt arrived at 7:30pm. Vickii Penna, LCSWA [phone]  Clinical Social Work

## 2012-02-06 ENCOUNTER — Ambulatory Visit (HOSPITAL_COMMUNITY): Admission: RE | Admit: 2012-02-06 | Payer: Medicare Other | Source: Ambulatory Visit

## 2012-02-06 DIAGNOSIS — R079 Chest pain, unspecified: Secondary | ICD-10-CM | POA: Diagnosis not present

## 2012-02-06 DIAGNOSIS — I6529 Occlusion and stenosis of unspecified carotid artery: Secondary | ICD-10-CM | POA: Diagnosis not present

## 2012-02-06 DIAGNOSIS — I2510 Atherosclerotic heart disease of native coronary artery without angina pectoris: Secondary | ICD-10-CM | POA: Diagnosis not present

## 2012-02-06 DIAGNOSIS — J449 Chronic obstructive pulmonary disease, unspecified: Secondary | ICD-10-CM | POA: Diagnosis not present

## 2012-02-07 ENCOUNTER — Telehealth: Payer: Self-pay | Admitting: *Deleted

## 2012-02-07 DIAGNOSIS — R079 Chest pain, unspecified: Secondary | ICD-10-CM | POA: Diagnosis not present

## 2012-02-07 DIAGNOSIS — I2510 Atherosclerotic heart disease of native coronary artery without angina pectoris: Secondary | ICD-10-CM | POA: Diagnosis not present

## 2012-02-07 DIAGNOSIS — I6529 Occlusion and stenosis of unspecified carotid artery: Secondary | ICD-10-CM | POA: Diagnosis not present

## 2012-02-07 DIAGNOSIS — J449 Chronic obstructive pulmonary disease, unspecified: Secondary | ICD-10-CM | POA: Diagnosis not present

## 2012-02-07 DIAGNOSIS — D62 Acute posthemorrhagic anemia: Secondary | ICD-10-CM | POA: Diagnosis not present

## 2012-02-07 NOTE — Telephone Encounter (Signed)
Fannie Knee, a nurse at [facility], called with concerns of Mr. Behlke having chest pain after physical therapy yesterday.  It was an 8 on a scale of 1-10.  He was given a total of 2 nitroglycerins, cardiac enzymes were drawn and and ekg was done. She wants him seen by his doctor and feels this chest pain is more heart related than post op surgical pain. I advised her to call his cardiologist and she agreed.

## 2012-02-09 ENCOUNTER — Encounter: Payer: Self-pay | Admitting: Vascular Surgery

## 2012-02-11 NOTE — Discharge Summary (Signed)
patient examined and medical record reviewed,agree with above note. VAN TRIGT III,PETER 02/11/2012    

## 2012-02-12 ENCOUNTER — Encounter: Payer: Self-pay | Admitting: Vascular Surgery

## 2012-02-13 ENCOUNTER — Encounter: Payer: Self-pay | Admitting: Vascular Surgery

## 2012-02-13 ENCOUNTER — Ambulatory Visit (INDEPENDENT_AMBULATORY_CARE_PROVIDER_SITE_OTHER): Payer: Medicare Other | Admitting: Vascular Surgery

## 2012-02-13 VITALS — BP 108/70 | HR 67 | Resp 20 | Ht 66.0 in | Wt 148.0 lb

## 2012-02-13 DIAGNOSIS — Z48812 Encounter for surgical aftercare following surgery on the circulatory system: Secondary | ICD-10-CM

## 2012-02-13 DIAGNOSIS — I6529 Occlusion and stenosis of unspecified carotid artery: Secondary | ICD-10-CM

## 2012-02-13 NOTE — Progress Notes (Signed)
Patient has today for followup of his combined left carotid endarterectomy Dacron patch angioplasty and coronary bypass grafting one month ago. He is here today with his son. He looks quite good. He had does have some slow recovery due to his preoperative debilitation. He has not had any new neurologic deficits. He did have a prior history of significant left brain stroke.  His left neck incision is well-healed he has no bruits bilaterally  Impression and plan stable followup of his left carotid endarterectomy. We will see him again in 6 months for continued followup. He does have known severe right carotid stenosis with no symptoms. I feel that the most appropriate to allow him for recovery and discuss benefit of endarterectomy for asymptomatic right carotid stenosis in 6 months. I did review symptoms of carotid disease with him and he in this horizontal notify us if he should this occur

## 2012-02-13 NOTE — Addendum Note (Signed)
Addended by: Sharee Pimple on: 02/13/2012 03:32 PM   Modules accepted: Orders

## 2012-02-14 ENCOUNTER — Other Ambulatory Visit: Payer: Self-pay | Admitting: Cardiothoracic Surgery

## 2012-02-14 DIAGNOSIS — I2510 Atherosclerotic heart disease of native coronary artery without angina pectoris: Secondary | ICD-10-CM

## 2012-02-19 ENCOUNTER — Ambulatory Visit
Admission: RE | Admit: 2012-02-19 | Discharge: 2012-02-19 | Disposition: A | Discharge status: Home / Self Care | Payer: Medicare Other | Source: Ambulatory Visit | Attending: Cardiothoracic Surgery | Admitting: Cardiothoracic Surgery

## 2012-02-19 ENCOUNTER — Ambulatory Visit (HOSPITAL_COMMUNITY)
Admission: RE | Admit: 2012-02-19 | Discharge: 2012-02-19 | Disposition: A | Discharge status: Home / Self Care | Source: Ambulatory Visit | Attending: Cardiovascular Disease | Admitting: Cardiovascular Disease

## 2012-02-19 ENCOUNTER — Ambulatory Visit (INDEPENDENT_AMBULATORY_CARE_PROVIDER_SITE_OTHER): Payer: Self-pay | Admitting: Surgical

## 2012-02-19 VITALS — BP 115/67 | HR 68 | Resp 18 | Ht 66.0 in | Wt 150.0 lb

## 2012-02-19 DIAGNOSIS — I079 Rheumatic tricuspid valve disease, unspecified: Secondary | ICD-10-CM | POA: Insufficient documentation

## 2012-02-19 DIAGNOSIS — J9819 Other pulmonary collapse: Secondary | ICD-10-CM | POA: Diagnosis not present

## 2012-02-19 DIAGNOSIS — I2510 Atherosclerotic heart disease of native coronary artery without angina pectoris: Secondary | ICD-10-CM

## 2012-02-19 DIAGNOSIS — Z951 Presence of aortocoronary bypass graft: Secondary | ICD-10-CM

## 2012-02-19 DIAGNOSIS — J449 Chronic obstructive pulmonary disease, unspecified: Secondary | ICD-10-CM | POA: Insufficient documentation

## 2012-02-19 DIAGNOSIS — I059 Rheumatic mitral valve disease, unspecified: Secondary | ICD-10-CM | POA: Insufficient documentation

## 2012-02-19 DIAGNOSIS — J90 Pleural effusion, not elsewhere classified: Secondary | ICD-10-CM | POA: Diagnosis not present

## 2012-02-19 DIAGNOSIS — R0602 Shortness of breath: Secondary | ICD-10-CM | POA: Diagnosis not present

## 2012-02-19 DIAGNOSIS — I513 Intracardiac thrombosis, not elsewhere classified: Secondary | ICD-10-CM

## 2012-02-19 DIAGNOSIS — Z87891 Personal history of nicotine dependence: Secondary | ICD-10-CM | POA: Insufficient documentation

## 2012-02-19 DIAGNOSIS — E119 Type 2 diabetes mellitus without complications: Secondary | ICD-10-CM | POA: Insufficient documentation

## 2012-02-19 DIAGNOSIS — J4489 Other specified chronic obstructive pulmonary disease: Secondary | ICD-10-CM | POA: Insufficient documentation

## 2012-02-19 DIAGNOSIS — I10 Essential (primary) hypertension: Secondary | ICD-10-CM | POA: Insufficient documentation

## 2012-02-19 MED ORDER — SODIUM CHLORIDE 0.9 % IV SOLN
INTRAVENOUS | Status: DC
  Administered 2012-02-19: 500 mL via INTRAVENOUS

## 2012-02-19 MED ORDER — PERFLUTREN LIPID MICROSPHERE
1.0000 mL | INTRAVENOUS | Status: AC | PRN
  Administered 2012-02-19: 2 mL via INTRAVENOUS

## 2012-02-19 NOTE — Progress Notes (Signed)
*  PRELIMINARY RESULTS* Echocardiogram 2D Echocardiogram has been performed.  Charles Hatfield 02/19/2012, 3:57 PM

## 2012-02-19 NOTE — Patient Instructions (Signed)
Discussed using walker and wheelchair as needed Continue rehab at [facility]

## 2012-02-19 NOTE — Progress Notes (Signed)
PCP is DEFAULT,PROVIDER, MD Referring Provider is Croitoru, Rachelle Hora, MD  Chief Complaint  Patient presents with  . Routine Post Op    3 week f/u from surgery with CXR, S/P CABG x3 on 01/22/12    [id number] is doing well at [facility] with good improvement of physical conditioning. Some sternal pain at times. No issues related to incisions. No SOB or CP. No constitutional SX.    Past Medical History  Diagnosis Date  . Hypertension   . Diabetes mellitus   . H/O ascending aorta repair   . Stroke   . Hematuria 01/15/2012  . Staghorn renal calculus 01/15/2012  . Left ventricular apical thrombus following MI, NSTEMI, 01/13/12 01/15/2012  . DM (diabetes mellitus) 01/15/2012  . Thrombocytopenia   . COPD (chronic obstructive pulmonary disease)     Past Surgical History  Procedure Date  . Repair thoracic aorta     Ascending thoracic aorto repair  . Coronary artery bypass graft 01/22/2012    Procedure: CORONARY ARTERY BYPASS GRAFTING (CABG);  Surgeon: Kerin Perna, MD;  Location: [facility] OR;  Service: Open Heart Surgery;  Laterality: N/A;  CABG x three using left internal mammary artery and right leg greater saphenous vein harvested endoscopically  . Endarterectomy 01/22/2012    Procedure: ENDARTERECTOMY CAROTID;  Surgeon: Larina Earthly, MD;  Location: [facility] OR;  Service: Vascular;  Laterality: Left;  . Carotid endarterectomy     Family History  Problem Relation Age of Onset  . Heart disease Father     Social History History  Substance Use Topics  . Smoking status: Former Smoker -- 1.0 packs/day    Types: Cigarettes    Quit date: 01/13/2012  . Smokeless tobacco: Never Used  . Alcohol Use: No    Current Outpatient Prescriptions  Medication Sig Dispense Refill  . amiodarone (PACERONE) 200 MG tablet Take 1 tablet (200 mg total) by mouth daily.  60 tablet    . aspirin 325 MG tablet Take 325 mg by mouth daily.      Marland Kitchen atorvastatin (LIPITOR) 20 MG tablet Take 1 tablet (20 mg total) by mouth  daily at 6 PM.      . Fluticasone-Salmeterol (ADVAIR) 250-50 MCG/DOSE AEPB Inhale 1 puff into the lungs 2 (two) times daily.  60 each      No Known Allergies  Review of Systemsotherwise non contributory  BP 115/67  Pulse 68  Resp 18  Ht 5\' 6"  (1.676 m)  Wt 150 lb (68.04 kg)  BMI 24.21 kg/m2  SpO2 97% Physical Exam General- well developed Male in no distress. No change in neuro exam from previous stroke Lungs- clear Cor- RRR S1S2, no rub Ext- no edema Incisions- healing well   Diagnostic Tests:CXR reveals minor effusion/atx    Impression:Doing well   Plan:return prn for any surgical issues

## 2012-02-19 NOTE — Progress Notes (Signed)
Assisted with administering Definity. 1530   Bp145/68  p69  1541  2 cc Definity administered over 15 sec 1547  bp 132/79  p 62

## 2012-02-20 HISTORY — PX: NM MYOVIEW LTD: HXRAD82

## 2012-02-21 ENCOUNTER — Ambulatory Visit (HOSPITAL_COMMUNITY): Payer: Medicare Other

## 2012-02-22 DIAGNOSIS — I6529 Occlusion and stenosis of unspecified carotid artery: Secondary | ICD-10-CM | POA: Diagnosis not present

## 2012-02-22 DIAGNOSIS — I2510 Atherosclerotic heart disease of native coronary artery without angina pectoris: Secondary | ICD-10-CM | POA: Diagnosis not present

## 2012-02-22 DIAGNOSIS — E119 Type 2 diabetes mellitus without complications: Secondary | ICD-10-CM | POA: Diagnosis not present

## 2012-02-22 DIAGNOSIS — J449 Chronic obstructive pulmonary disease, unspecified: Secondary | ICD-10-CM | POA: Diagnosis not present

## 2012-03-05 DIAGNOSIS — E782 Mixed hyperlipidemia: Secondary | ICD-10-CM | POA: Diagnosis not present

## 2012-03-05 DIAGNOSIS — Z951 Presence of aortocoronary bypass graft: Secondary | ICD-10-CM | POA: Diagnosis not present

## 2012-03-05 DIAGNOSIS — I4891 Unspecified atrial fibrillation: Secondary | ICD-10-CM | POA: Diagnosis not present

## 2012-03-05 DIAGNOSIS — I2510 Atherosclerotic heart disease of native coronary artery without angina pectoris: Secondary | ICD-10-CM | POA: Diagnosis not present

## 2012-03-12 DIAGNOSIS — I4891 Unspecified atrial fibrillation: Secondary | ICD-10-CM | POA: Diagnosis not present

## 2012-03-18 DIAGNOSIS — Z7901 Long term (current) use of anticoagulants: Secondary | ICD-10-CM | POA: Diagnosis not present

## 2012-03-18 DIAGNOSIS — I4891 Unspecified atrial fibrillation: Secondary | ICD-10-CM | POA: Diagnosis not present

## 2012-03-25 DIAGNOSIS — Z7901 Long term (current) use of anticoagulants: Secondary | ICD-10-CM | POA: Diagnosis not present

## 2012-03-25 DIAGNOSIS — I4891 Unspecified atrial fibrillation: Secondary | ICD-10-CM | POA: Diagnosis not present

## 2012-04-03 DIAGNOSIS — I4891 Unspecified atrial fibrillation: Secondary | ICD-10-CM | POA: Diagnosis not present

## 2012-04-03 DIAGNOSIS — Z7901 Long term (current) use of anticoagulants: Secondary | ICD-10-CM | POA: Diagnosis not present

## 2012-04-15 DIAGNOSIS — Z7901 Long term (current) use of anticoagulants: Secondary | ICD-10-CM | POA: Diagnosis not present

## 2012-04-15 DIAGNOSIS — I4891 Unspecified atrial fibrillation: Secondary | ICD-10-CM | POA: Diagnosis not present

## 2012-05-06 DIAGNOSIS — I4891 Unspecified atrial fibrillation: Secondary | ICD-10-CM | POA: Diagnosis not present

## 2012-05-06 DIAGNOSIS — Z7901 Long term (current) use of anticoagulants: Secondary | ICD-10-CM | POA: Diagnosis not present

## 2012-06-03 DIAGNOSIS — Z79899 Other long term (current) drug therapy: Secondary | ICD-10-CM | POA: Diagnosis not present

## 2012-06-03 DIAGNOSIS — I2510 Atherosclerotic heart disease of native coronary artery without angina pectoris: Secondary | ICD-10-CM | POA: Diagnosis not present

## 2012-06-03 DIAGNOSIS — Z7901 Long term (current) use of anticoagulants: Secondary | ICD-10-CM | POA: Diagnosis not present

## 2012-06-03 DIAGNOSIS — E785 Hyperlipidemia, unspecified: Secondary | ICD-10-CM | POA: Diagnosis not present

## 2012-06-03 DIAGNOSIS — E782 Mixed hyperlipidemia: Secondary | ICD-10-CM | POA: Diagnosis not present

## 2012-06-03 DIAGNOSIS — I4891 Unspecified atrial fibrillation: Secondary | ICD-10-CM | POA: Diagnosis not present

## 2012-06-21 DIAGNOSIS — R3915 Urgency of urination: Secondary | ICD-10-CM | POA: Diagnosis not present

## 2012-06-21 DIAGNOSIS — N200 Calculus of kidney: Secondary | ICD-10-CM | POA: Diagnosis not present

## 2012-06-21 DIAGNOSIS — N401 Enlarged prostate with lower urinary tract symptoms: Secondary | ICD-10-CM | POA: Diagnosis not present

## 2012-07-05 DIAGNOSIS — Z7901 Long term (current) use of anticoagulants: Secondary | ICD-10-CM | POA: Diagnosis not present

## 2012-07-05 DIAGNOSIS — I4891 Unspecified atrial fibrillation: Secondary | ICD-10-CM | POA: Diagnosis not present

## 2012-08-12 DIAGNOSIS — Z7901 Long term (current) use of anticoagulants: Secondary | ICD-10-CM | POA: Diagnosis not present

## 2012-08-12 DIAGNOSIS — I4891 Unspecified atrial fibrillation: Secondary | ICD-10-CM | POA: Diagnosis not present

## 2012-08-19 ENCOUNTER — Encounter: Payer: Self-pay | Admitting: Neurosurgery

## 2012-08-20 ENCOUNTER — Ambulatory Visit: Payer: Medicare Other | Admitting: Neurosurgery

## 2012-08-20 ENCOUNTER — Other Ambulatory Visit: Payer: Medicare Other

## 2012-09-12 ENCOUNTER — Ambulatory Visit: Payer: Self-pay | Admitting: Cardiovascular Disease

## 2012-09-12 DIAGNOSIS — I236 Thrombosis of atrium, auricular appendage, and ventricle as current complications following acute myocardial infarction: Secondary | ICD-10-CM

## 2012-09-12 DIAGNOSIS — Z7901 Long term (current) use of anticoagulants: Secondary | ICD-10-CM | POA: Insufficient documentation

## 2012-09-16 DIAGNOSIS — Z7901 Long term (current) use of anticoagulants: Secondary | ICD-10-CM | POA: Diagnosis not present

## 2012-09-16 DIAGNOSIS — I4891 Unspecified atrial fibrillation: Secondary | ICD-10-CM | POA: Diagnosis not present

## 2012-09-16 DIAGNOSIS — L97509 Non-pressure chronic ulcer of other part of unspecified foot with unspecified severity: Secondary | ICD-10-CM | POA: Diagnosis not present

## 2012-09-16 DIAGNOSIS — M2010 Hallux valgus (acquired), unspecified foot: Secondary | ICD-10-CM | POA: Diagnosis not present

## 2012-09-16 DIAGNOSIS — M79609 Pain in unspecified limb: Secondary | ICD-10-CM | POA: Diagnosis not present

## 2012-09-16 DIAGNOSIS — E119 Type 2 diabetes mellitus without complications: Secondary | ICD-10-CM | POA: Diagnosis not present

## 2012-10-09 DIAGNOSIS — M2010 Hallux valgus (acquired), unspecified foot: Secondary | ICD-10-CM | POA: Diagnosis not present

## 2012-10-09 DIAGNOSIS — L97509 Non-pressure chronic ulcer of other part of unspecified foot with unspecified severity: Secondary | ICD-10-CM | POA: Diagnosis not present

## 2012-10-09 DIAGNOSIS — I4891 Unspecified atrial fibrillation: Secondary | ICD-10-CM | POA: Diagnosis not present

## 2012-10-09 DIAGNOSIS — Z7901 Long term (current) use of anticoagulants: Secondary | ICD-10-CM | POA: Diagnosis not present

## 2012-10-09 DIAGNOSIS — B353 Tinea pedis: Secondary | ICD-10-CM | POA: Diagnosis not present

## 2012-11-05 ENCOUNTER — Other Ambulatory Visit: Payer: Self-pay | Admitting: Cardiovascular Disease

## 2012-11-05 NOTE — Telephone Encounter (Signed)
We have no record of Dr. Salena Saner. Ever giving this medication to the patient.

## 2012-11-08 ENCOUNTER — Ambulatory Visit: Payer: Medicare Other | Admitting: Pharmacist Clinician (PhC)/ Clinical Pharmacy Specialist

## 2012-11-12 ENCOUNTER — Other Ambulatory Visit: Payer: Self-pay | Admitting: Cardiovascular Disease

## 2012-11-12 NOTE — Telephone Encounter (Signed)
Please advise 

## 2012-11-13 ENCOUNTER — Ambulatory Visit: Payer: Medicare Other | Admitting: Pharmacist Clinician (PhC)/ Clinical Pharmacy Specialist

## 2012-11-15 DIAGNOSIS — L97509 Non-pressure chronic ulcer of other part of unspecified foot with unspecified severity: Secondary | ICD-10-CM | POA: Diagnosis not present

## 2012-11-15 DIAGNOSIS — M2010 Hallux valgus (acquired), unspecified foot: Secondary | ICD-10-CM | POA: Diagnosis not present

## 2012-11-21 ENCOUNTER — Other Ambulatory Visit: Payer: Self-pay | Admitting: Cardiovascular Disease

## 2012-11-22 ENCOUNTER — Ambulatory Visit: Payer: Medicare Other | Admitting: Pharmacist Clinician (PhC)/ Clinical Pharmacy Specialist

## 2012-11-23 NOTE — Telephone Encounter (Signed)
Called in Ambien CR to [facility] 11/23/12 @ 12:55pm 

## 2012-11-26 ENCOUNTER — Ambulatory Visit: Payer: Medicare Other | Admitting: Pharmacist Clinician (PhC)/ Clinical Pharmacy Specialist

## 2012-12-02 ENCOUNTER — Ambulatory Visit: Payer: Medicare Other | Admitting: Pharmacist Clinician (PhC)/ Clinical Pharmacy Specialist

## 2012-12-04 ENCOUNTER — Ambulatory Visit: Payer: Medicare Other | Admitting: Pharmacist Clinician (PhC)/ Clinical Pharmacy Specialist

## 2012-12-11 ENCOUNTER — Ambulatory Visit (INDEPENDENT_AMBULATORY_CARE_PROVIDER_SITE_OTHER): Payer: Medicare Other | Admitting: Pharmacist Clinician (PhC)/ Clinical Pharmacy Specialist

## 2012-12-11 VITALS — BP 116/64 | HR 72

## 2012-12-11 DIAGNOSIS — I236 Thrombosis of atrium, auricular appendage, and ventricle as current complications following acute myocardial infarction: Secondary | ICD-10-CM

## 2012-12-11 DIAGNOSIS — Z7901 Long term (current) use of anticoagulants: Secondary | ICD-10-CM | POA: Diagnosis not present

## 2012-12-11 DIAGNOSIS — I238 Other current complications following acute myocardial infarction: Secondary | ICD-10-CM | POA: Diagnosis not present

## 2012-12-11 DIAGNOSIS — I219 Acute myocardial infarction, unspecified: Secondary | ICD-10-CM

## 2012-12-12 ENCOUNTER — Telehealth: Payer: Self-pay | Admitting: Cardiovascular Disease

## 2012-12-12 DIAGNOSIS — L97509 Non-pressure chronic ulcer of other part of unspecified foot with unspecified severity: Secondary | ICD-10-CM | POA: Diagnosis not present

## 2012-12-12 DIAGNOSIS — M25579 Pain in unspecified ankle and joints of unspecified foot: Secondary | ICD-10-CM | POA: Diagnosis not present

## 2012-12-12 NOTE — Telephone Encounter (Signed)
Unable to leave message on cell phone.  Will call in am.

## 2012-12-12 NOTE — Telephone Encounter (Signed)
Message forwarded to Dr. Croitoru.  

## 2012-12-12 NOTE — Telephone Encounter (Signed)
Mr. Footman was receiving warfarin for a left ventricular apical thrombus following an acute anterior wall infarction. This happened less than a year ago. He should have an echocardiogram to confirm resolution of the left ventricular thrombus before we can simply stop his warfarin for an elective procedure. It is about time to repeat the echo anyway. Please order a 2-D echo.

## 2012-12-12 NOTE — Telephone Encounter (Signed)
[name] from [facility] is wanting to know if Charles Hatfield can come off of his coumadin so that he can have surgery on his foot. Please call   Thanks

## 2012-12-13 ENCOUNTER — Other Ambulatory Visit (HOSPITAL_COMMUNITY): Payer: Self-pay | Admitting: Internal Medicine

## 2012-12-13 ENCOUNTER — Other Ambulatory Visit (HOSPITAL_COMMUNITY): Payer: Self-pay | Admitting: Podiatry

## 2012-12-13 ENCOUNTER — Telehealth (HOSPITAL_COMMUNITY): Payer: Self-pay | Admitting: Podiatry

## 2012-12-13 DIAGNOSIS — I8290 Acute embolism and thrombosis of unspecified vein: Secondary | ICD-10-CM

## 2012-12-13 NOTE — Telephone Encounter (Signed)
Discussed w/son-need to schedule Echo ASAP to evaluate LV ?thrombus to determine if he can Hold Warfarin for surgery. 

## 2012-12-13 NOTE — Telephone Encounter (Signed)
Discussed w/son-need to schedule Echo ASAP to evaluate LV ?thrombus to determine if he can Hold Warfarin for surgery.

## 2012-12-13 NOTE — Telephone Encounter (Signed)
Spoke with [name] @ the [facility], We are trying to coordinate for this patient to have an echo for clearance. I have tried to call the patient and his son Casimiro Needle on the phone numbers provided but I am unable to reach either of them. [name] will try again after lunch and if she reaches them she will provide my contact information for scheduling

## 2012-12-16 ENCOUNTER — Telehealth: Payer: Self-pay | Admitting: Podiatry

## 2012-12-16 NOTE — Telephone Encounter (Signed)
Left message on VM to call back regarding scheduling appt for [facility]

## 2012-12-17 ENCOUNTER — Encounter: Payer: Self-pay | Admitting: *Deleted

## 2012-12-17 ENCOUNTER — Encounter: Payer: Self-pay | Admitting: Cardiovascular Disease

## 2012-12-18 ENCOUNTER — Encounter: Payer: Self-pay | Admitting: Cardiovascular Disease

## 2012-12-18 ENCOUNTER — Encounter (HOSPITAL_COMMUNITY): Payer: Self-pay | Admitting: Cardiovascular Disease

## 2012-12-18 ENCOUNTER — Ambulatory Visit (INDEPENDENT_AMBULATORY_CARE_PROVIDER_SITE_OTHER): Payer: Medicare Other | Admitting: Cardiovascular Disease

## 2012-12-18 VITALS — BP 126/68 | HR 63 | Ht 66.0 in | Wt 156.4 lb

## 2012-12-18 DIAGNOSIS — F17200 Nicotine dependence, unspecified, uncomplicated: Secondary | ICD-10-CM

## 2012-12-18 DIAGNOSIS — I739 Peripheral vascular disease, unspecified: Secondary | ICD-10-CM

## 2012-12-18 DIAGNOSIS — Z01810 Encounter for preprocedural cardiovascular examination: Secondary | ICD-10-CM

## 2012-12-18 DIAGNOSIS — I10 Essential (primary) hypertension: Secondary | ICD-10-CM

## 2012-12-18 DIAGNOSIS — Z720 Tobacco use: Secondary | ICD-10-CM

## 2012-12-18 DIAGNOSIS — I219 Acute myocardial infarction, unspecified: Secondary | ICD-10-CM

## 2012-12-18 DIAGNOSIS — I513 Intracardiac thrombosis, not elsewhere classified: Secondary | ICD-10-CM

## 2012-12-18 DIAGNOSIS — I2589 Other forms of chronic ischemic heart disease: Secondary | ICD-10-CM

## 2012-12-18 DIAGNOSIS — I2510 Atherosclerotic heart disease of native coronary artery without angina pectoris: Secondary | ICD-10-CM | POA: Diagnosis not present

## 2012-12-18 DIAGNOSIS — I255 Ischemic cardiomyopathy: Secondary | ICD-10-CM

## 2012-12-18 NOTE — Assessment & Plan Note (Signed)
He successfully quit and is staying quit. This is outstanding

## 2012-12-18 NOTE — Patient Instructions (Addendum)
Dr. Royann Shivers wants you to have a lower extremity doppler study for claudication.

## 2012-12-18 NOTE — Assessment & Plan Note (Signed)
He seems to have a completed inferior wall scar. He has no clinical evidence of congestive heart failure. He has only mildly depressed left ventricle systolic function. He is on appropriate treatment with maximum tolerated doses of ACE inhibitors. Unfortunately beta blockers were not given because of COPD. He does not require chronic diuretic therapy.

## 2012-12-18 NOTE — Assessment & Plan Note (Signed)
The risk of cardiovascular complications with the planned foot surgery is low, but I think two issues need to be addressed before he has surgery. It would be useful to know whether or not he still has a left ventricular apical thrombus. Even if this is still present it is likely firmly attached to the left ventricular wall this point and would not preclude to discontinuation of warfarin. Nevertheless, it would make [place] more aggressive in reinstitution of anticoagulation.  More importantly it is necessary to know if he has extensive peripheral arterial disease of the lower shoelace. He does have weak pulses in his feet. He has a known coronary and carotid artery disease as well as bilateral femoral bruits. He would not be a big surprise if he has extensive lower extremity arterial obstruction.

## 2012-12-18 NOTE — Assessment & Plan Note (Signed)
Asymptomatic within the first year after bypass surgery. I do not think further imaging studies are necessary before he has foot surgery

## 2012-12-18 NOTE — Progress Notes (Signed)
Patient ID: Charles Hatfield, male   DOB: 1940-07-28, 72 y.o.   MRN: [id number]     Reason for office visit Preoperative risk evaluation  Mr. Honaker is now almost one year status post three-vessel bypass surgery (LIMA to LAD, SVG to second diagonal, SVG to distal right coronary artery posterolateral branch) and has not had any recurrent problems with coarse efficiency. He has mild ischemic cardiomyopathy with ejection fraction estimated to be 40-45% as and findings suggest a scar in the distribution of the distal right coronary artery. He presented with shortness of breath before surgery but he has not had clinically evident congestive heart failure since bypass or. He quit smoking at the time of bypass surgery and there has been significant improvement in his breathing although he likely still has residual COPD related abnormalities. Heass well treated dyslipidemia and hypertension.  A small infero-apical left ventricular thrombus was detected by echo but he has not been on anticoagulation for almost a year. We'll reassess this with another echo. He had perioperative atrial fibrillation but from a clinical standpoint there have been no recurrent episodes since then  He is due to have foot surgery with Dr. Elvin So. He has an ulcer or of the left hallux related to deviation of his toe.    No Known Allergies  Current Outpatient Prescriptions  Medication Sig Dispense Refill  . acetaminophen (TYLENOL) 500 MG tablet Take 500 mg by mouth every 6 (six) hours as needed for pain.      Marland Kitchen albuterol (PROVENTIL HFA;VENTOLIN HFA) 108 (90 BASE) MCG/ACT inhaler Inhale 2 puffs into the lungs every 6 (six) hours as needed for wheezing.      Marland Kitchen aspirin 81 MG tablet Take 81 mg by mouth daily.      Marland Kitchen atorvastatin (LIPITOR) 20 MG tablet Take 1 tablet (20 mg total) by mouth daily at 6 PM.      . bisacodyl (BISACODYL) 5 MG EC tablet Take 5 mg by mouth daily as needed for constipation.      Marland Kitchen HYDROcodone-acetaminophen  (NORCO/VICODIN) 5-325 MG per tablet Take 1 tablet by mouth every 6 (six) hours as needed for pain.      Marland Kitchen ipratropium (ATROVENT HFA) 17 MCG/ACT inhaler Inhale 2 puffs into the lungs every 6 (six) hours.      Marland Kitchen lisinopril (PRINIVIL,ZESTRIL) 2.5 MG tablet Take 2.5 mg by mouth daily.      . nitroGLYCERIN (NITROSTAT) 0.4 MG SL tablet Place 0.4 mg under the tongue every 5 (five) minutes as needed for chest pain.      . tamsulosin (FLOMAX) 0.4 MG CAPS Take 0.4 mg by mouth daily.      . traMADol (ULTRAM) 50 MG tablet TAKE ONE TABLET BY MOUTH EVERY 6 HOURS AS NEEDED  60 tablet  0  . warfarin (COUMADIN) 5 MG tablet Take 5 mg by mouth daily. Per INR      . zolpidem (AMBIEN CR) 6.25 MG CR tablet TAKE ONE TABLET BY MOUTH AT BEDTIME AS NEEDED FOR SLEEP  30 tablet  0   No current facility-administered medications for this visit.    Past Medical History  Diagnosis Date  . Hypertension   . Diabetes mellitus   . H/O ascending aorta repair   . Stroke   . Hematuria 01/15/2012  . Staghorn renal calculus 01/15/2012  . Left ventricular apical thrombus following MI, NSTEMI, 01/13/12 01/15/2012  . DM (diabetes mellitus) 01/15/2012  . Thrombocytopenia   . COPD (chronic obstructive pulmonary disease)   .  CAD (coronary artery disease)   . S/P CABG x 3 01/22/2012    LIMA to LAD,SVG to second diagonal,SVG to distal RCA  . Dyslipidemia   . Ischemic cardiomyopathy   . LV (left ventricular) mural thrombus     inferapical  . Atrial fibrillation     perioperative  . Carotid stenosis     bilateral    Past Surgical History  Procedure Laterality Date  . Repair thoracic aorta      Ascending thoracic aorto repair  . Coronary artery bypass graft  01/22/2012    Procedure: CORONARY ARTERY BYPASS GRAFTING (CABG);  Surgeon: Kerin Perna, MD;  Location: [facility] OR;  Service: Open Heart Surgery;  Laterality: N/A;  CABG x three using left internal mammary artery and right leg greater saphenous vein harvested endoscopically  .  Endarterectomy  01/22/2012    Procedure: ENDARTERECTOMY CAROTID;  Surgeon: Larina Earthly, MD;  Location: [facility] OR;  Service: Vascular;  Laterality: Left;  . Carotid endarterectomy  01/13/2012  . Nm myoview ltd  02/20/2012    infarct/scar w/mild perinfarct ischemia, EF 38%, abnormal nuc, no sign. ischemia    Family History  Problem Relation Age of Onset  . Heart disease Father     History   Social History  . Marital Status: Widowed    Spouse Name: N/A    Number of Children: N/A  . Years of Education: N/A   Occupational History  . Not on file.   Social History Main Topics  . Smoking status: Former Smoker -- 1.00 packs/day    Types: Cigarettes    Quit date: 01/13/2012  . Smokeless tobacco: Never Used  . Alcohol Use: No  . Drug Use: No  . Sexually Active: Not on file   Other Topics Concern  . Not on file   Social History Narrative  . No narrative on file    Review of systems: The patient specifically denies any chest pain at rest or with exertion, dyspnea at rest or with exertion, orthopnea, paroxysmal nocturnal dyspnea, syncope, palpitations, focal neurological deficits,  lower extremity edema, unexplained weight gain; he rarely has cough or wheezing, but no hemoptysis and no recently increased sputum production. He has leg pain somewhat suggestive of intermittent claudication bilateral calves  The patient also denies abdominal pain, nausea, vomiting, dysphagia, diarrhea, constipation, polyuria, polydipsia, dysuria, hematuria, frequency, urgency, abnormal bleeding or bruising, fever, chills, unexpected weight changes, mood swings, change in skin or hair texture, change in voice quality, auditory or visual problems, allergic reactions or rashes, new musculoskeletal complaints other than usual "aches and pains".   PHYSICAL EXAM BP 126/68  Pulse 63  Ht 5\' 6"  (1.676 m)  Wt 156 lb 6.4 oz (70.943 kg)  BMI 25.26 kg/m2  General: Alert, oriented x3, no distress Head: no evidence of  trauma, PERRL, EOMI, no exophtalmos or lid lag, no myxedema, no xanthelasma; normal ears, nose and oropharynx Neck: normal jugular venous pulsations and no hepatojugular reflux; brisk carotid pulses without delay and no carotid bruits; healed left carotid endarterectomy scar Chest: clear to auscultation, but generally reduced and distant breath sounds, no signs of consolidation by percussion or palpation, normal fremitus, symmetrical and full respiratory excursions; healed sternotomy scar Cardiovascular: normal position and quality of the apical impulse, regular rhythm, normal first and second heart sounds, no murmurs, rubs or gallops Abdomen: no tenderness or distention, no masses by palpation, no abnormal pulsatility or arterial bruits, normal bowel sounds, no hepatosplenomegaly Extremities: no clubbing, cyanosis or edema;  2+ radial, ulnar and brachial pulses bilaterally; 2+ right femoral, thready posterior tibial and dorsalis pedis pulses; 2+ left femoral, thready  posterior tibial and dorsalis pedis pulses; no subclavian; bilateral femoral bruits are present Neurological: grossly nonfocal   EKG: Normal sinus rhythm, deep inferior Q waves as well as Q waves in leads V5 and V6, very slight ST segment depression in leads 1 and aVL unchanged from older tracings  Lipid Panel     Component Value Date/Time   CHOL 141 01/14/2012 0600   TRIG 246* 01/14/2012 0600   HDL 30* 01/14/2012 0600   CHOLHDL 4.7 01/14/2012 0600   VLDL 49* 01/14/2012 0600   LDLCALC 62 01/14/2012 0600    BMET    Component Value Date/Time   NA 136 01/29/2012 0644   K 4.3 01/29/2012 0644   CL 97 01/29/2012 0644   CO2 22 01/29/2012 0644   GLUCOSE 141* 01/29/2012 0644   BUN 24* 01/29/2012 0644   CREATININE 1.28 01/29/2012 0644   CALCIUM 9.6 01/29/2012 0644   GFRNONAA 55* 01/29/2012 0644   GFRAA 64* 01/29/2012 0644     ASSESSMENT AND PLAN Preop cardiovascular exam The risk of cardiovascular complications with the planned foot surgery is low,  but I think two issues need to be addressed before he has surgery. It would be useful to know whether or not he still has a left ventricular apical thrombus. Even if this is still present it is likely firmly attached to the left ventricular wall this point and would not preclude to discontinuation of warfarin. Nevertheless, it would make [place] more aggressive in reinstitution of anticoagulation.  More importantly it is necessary to know if he has extensive peripheral arterial disease of the lower shoelace. He does have weak pulses in his feet. He has a known coronary and carotid artery disease as well as bilateral femoral bruits. He would not be a big surprise if he has extensive lower extremity arterial obstruction.  CAD (coronary artery disease), 3-V at cath 01/16/12 Asymptomatic within the first year after bypass surgery. I do not think further imaging studies are necessary before he has foot surgery  Tobacco abuse He successfully quit and is staying quit. This is outstanding  HTN (hypertension) Good control  Ischemic cardiomyopathy, EF 40-45% 2D 01/15/12 He seems to have a completed inferior wall scar. He has no clinical evidence of congestive heart failure. He has only mildly depressed left ventricle systolic function. He is on appropriate treatment with maximum tolerated doses of ACE inhibitors. Unfortunately beta blockers were not given because of COPD. He does not require chronic diuretic therapy.   Orders Placed This Encounter  Procedures  . EKG 12-Lead  . Lower Extremity Arterial Duplex Bilateral   Meds ordered this encounter  Medications  . ipratropium (ATROVENT HFA) 17 MCG/ACT inhaler    Sig: Inhale 2 puffs into the lungs every 6 (six) hours.  Marland Kitchen albuterol (PROVENTIL HFA;VENTOLIN HFA) 108 (90 BASE) MCG/ACT inhaler    Sig: Inhale 2 puffs into the lungs every 6 (six) hours as needed for wheezing.  . bisacodyl (BISACODYL) 5 MG EC tablet    Sig: Take 5 mg by mouth daily as needed for  constipation.  Marland Kitchen HYDROcodone-acetaminophen (NORCO/VICODIN) 5-325 MG per tablet    Sig: Take 1 tablet by mouth every 6 (six) hours as needed for pain.  Marland Kitchen acetaminophen (TYLENOL) 500 MG tablet    Sig: Take 500 mg by mouth every 6 (six) hours as needed for pain.    Amiera Herzberg  Thurmon Fair, MD, [facility] [phone] office [phone] pager

## 2012-12-18 NOTE — Assessment & Plan Note (Signed)
Good control

## 2012-12-20 ENCOUNTER — Ambulatory Visit (HOSPITAL_COMMUNITY): Payer: Medicare Other

## 2012-12-24 ENCOUNTER — Ambulatory Visit (HOSPITAL_COMMUNITY): Payer: Medicare Other

## 2012-12-26 ENCOUNTER — Ambulatory Visit (HOSPITAL_COMMUNITY)
Admission: RE | Admit: 2012-12-26 | Discharge: 2012-12-26 | Disposition: A | Discharge status: Home / Self Care | Payer: Medicare Other | Source: Ambulatory Visit | Attending: Cardiovascular Disease | Admitting: Cardiovascular Disease

## 2012-12-26 DIAGNOSIS — I739 Peripheral vascular disease, unspecified: Secondary | ICD-10-CM | POA: Insufficient documentation

## 2012-12-26 DIAGNOSIS — I70219 Atherosclerosis of native arteries of extremities with intermittent claudication, unspecified extremity: Secondary | ICD-10-CM

## 2012-12-26 NOTE — Progress Notes (Signed)
Lower Extremity Arterial Duplex Completed. °Charles Hatfield ° °

## 2012-12-31 ENCOUNTER — Other Ambulatory Visit: Payer: Self-pay

## 2012-12-31 MED ORDER — TRAMADOL HCL 50 MG PO TABS: 50.0000 mg | ORAL_TABLET | Freq: Four times a day (QID) | ORAL | Status: DC | PRN

## 2012-12-31 NOTE — Telephone Encounter (Signed)
Rx was sent to pharmacy electronically. 

## 2013-01-01 ENCOUNTER — Ambulatory Visit (HOSPITAL_COMMUNITY)
Admission: RE | Admit: 2013-01-01 | Discharge: 2013-01-01 | Disposition: A | Discharge status: Home / Self Care | Payer: Medicare Other | Source: Ambulatory Visit | Attending: Podiatry | Admitting: Podiatry

## 2013-01-01 ENCOUNTER — Inpatient Hospital Stay (HOSPITAL_COMMUNITY): Admission: RE | Admit: 2013-01-01 | Payer: Medicare Other | Source: Ambulatory Visit

## 2013-01-01 ENCOUNTER — Ambulatory Visit (INDEPENDENT_AMBULATORY_CARE_PROVIDER_SITE_OTHER): Payer: Medicare Other | Admitting: Pharmacist Clinician (PhC)/ Clinical Pharmacy Specialist

## 2013-01-01 VITALS — BP 118/78 | HR 69

## 2013-01-01 DIAGNOSIS — I236 Thrombosis of atrium, auricular appendage, and ventricle as current complications following acute myocardial infarction: Secondary | ICD-10-CM

## 2013-01-01 DIAGNOSIS — Z7901 Long term (current) use of anticoagulants: Secondary | ICD-10-CM | POA: Diagnosis not present

## 2013-01-01 DIAGNOSIS — E119 Type 2 diabetes mellitus without complications: Secondary | ICD-10-CM | POA: Insufficient documentation

## 2013-01-01 DIAGNOSIS — I8290 Acute embolism and thrombosis of unspecified vein: Secondary | ICD-10-CM

## 2013-01-01 DIAGNOSIS — N289 Disorder of kidney and ureter, unspecified: Secondary | ICD-10-CM | POA: Diagnosis not present

## 2013-01-01 DIAGNOSIS — I428 Other cardiomyopathies: Secondary | ICD-10-CM | POA: Insufficient documentation

## 2013-01-01 DIAGNOSIS — I2510 Atherosclerotic heart disease of native coronary artery without angina pectoris: Secondary | ICD-10-CM | POA: Diagnosis not present

## 2013-01-01 DIAGNOSIS — I238 Other current complications following acute myocardial infarction: Secondary | ICD-10-CM

## 2013-01-01 DIAGNOSIS — J449 Chronic obstructive pulmonary disease, unspecified: Secondary | ICD-10-CM | POA: Insufficient documentation

## 2013-01-01 DIAGNOSIS — J4489 Other specified chronic obstructive pulmonary disease: Secondary | ICD-10-CM | POA: Insufficient documentation

## 2013-01-01 DIAGNOSIS — I219 Acute myocardial infarction, unspecified: Secondary | ICD-10-CM | POA: Diagnosis not present

## 2013-01-01 DIAGNOSIS — F17200 Nicotine dependence, unspecified, uncomplicated: Secondary | ICD-10-CM | POA: Diagnosis not present

## 2013-01-01 DIAGNOSIS — I513 Intracardiac thrombosis, not elsewhere classified: Secondary | ICD-10-CM

## 2013-01-01 LAB — POCT INR: INR: 2.3

## 2013-01-01 NOTE — Progress Notes (Signed)
2D Echo Performed 01/01/2013    Jenipher Havel, RCS  

## 2013-01-09 ENCOUNTER — Telehealth: Payer: Self-pay | Admitting: Cardiovascular Disease

## 2013-01-09 DIAGNOSIS — M2010 Hallux valgus (acquired), unspecified foot: Secondary | ICD-10-CM | POA: Diagnosis not present

## 2013-01-09 DIAGNOSIS — L97509 Non-pressure chronic ulcer of other part of unspecified foot with unspecified severity: Secondary | ICD-10-CM | POA: Diagnosis not present

## 2013-01-09 NOTE — Telephone Encounter (Signed)
Is wanting to know the results of the echo .Marland Kitchen Please Call   Thanks

## 2013-01-10 NOTE — Progress Notes (Signed)
Son notified of doppler results.  Will schedule an appt w/Dr. Allyson Sabal.

## 2013-01-16 DIAGNOSIS — M2020 Hallux rigidus, unspecified foot: Secondary | ICD-10-CM | POA: Diagnosis not present

## 2013-01-17 ENCOUNTER — Telehealth: Payer: Self-pay | Admitting: Cardiovascular Disease

## 2013-01-17 NOTE — Telephone Encounter (Signed)
Agree - podiatrist or PCP are appropriate to order

## 2013-01-17 NOTE — Telephone Encounter (Signed)
Please call Casimiro Needle back regarding his father. He has some questions regarding the patient's toes.

## 2013-01-17 NOTE — Telephone Encounter (Signed)
Spoke to so Casimiro Needle, He was calling to see if Dr C could sign off on orders for some diabetic shoes. Podiatrist PA ordered them.  The patient was fitted,but now the are telling  The family they need an MD to sign.   Informed Casimiro Needle that usually a PCP takes care of this.Will defer to Dr Salena Saner. Suggested to Casimiro Needle to call back to podiatrist office to see if they could sign off.

## 2013-01-21 ENCOUNTER — Other Ambulatory Visit: Payer: Self-pay | Admitting: Cardiovascular Disease

## 2013-01-21 NOTE — Telephone Encounter (Signed)
Rx was sent to pharmacy electronically. 

## 2013-01-24 ENCOUNTER — Ambulatory Visit: Payer: Medicare Other | Admitting: Cardiovascular Disease

## 2013-02-04 ENCOUNTER — Ambulatory Visit: Payer: Medicare Other | Admitting: Pharmacist Clinician (PhC)/ Clinical Pharmacy Specialist

## 2013-02-10 ENCOUNTER — Ambulatory Visit (INDEPENDENT_AMBULATORY_CARE_PROVIDER_SITE_OTHER): Payer: Medicare Other | Admitting: Cardiovascular Disease

## 2013-02-10 ENCOUNTER — Ambulatory Visit (INDEPENDENT_AMBULATORY_CARE_PROVIDER_SITE_OTHER): Payer: Medicare Other | Admitting: Pharmacist Clinician (PhC)/ Clinical Pharmacy Specialist

## 2013-02-10 ENCOUNTER — Encounter: Payer: Self-pay | Admitting: Cardiovascular Disease

## 2013-02-10 VITALS — BP 118/70 | HR 76 | Ht 66.0 in | Wt 153.8 lb

## 2013-02-10 DIAGNOSIS — I219 Acute myocardial infarction, unspecified: Secondary | ICD-10-CM | POA: Diagnosis not present

## 2013-02-10 DIAGNOSIS — I998 Other disorder of circulatory system: Secondary | ICD-10-CM | POA: Insufficient documentation

## 2013-02-10 DIAGNOSIS — Z7901 Long term (current) use of anticoagulants: Secondary | ICD-10-CM

## 2013-02-10 DIAGNOSIS — I238 Other current complications following acute myocardial infarction: Secondary | ICD-10-CM

## 2013-02-10 DIAGNOSIS — I236 Thrombosis of atrium, auricular appendage, and ventricle as current complications following acute myocardial infarction: Secondary | ICD-10-CM

## 2013-02-10 DIAGNOSIS — I739 Peripheral vascular disease, unspecified: Secondary | ICD-10-CM | POA: Diagnosis not present

## 2013-02-10 LAB — POCT INR: INR: 1.7

## 2013-02-10 NOTE — Progress Notes (Signed)
02/10/2013 Charles Hatfield   12-Sep-1940  [id number]  Primary Physician Default, Provider, MD Primary Cardiologist: Runell Gess MD [name]   HPI:  Charles Hatfield is now almost one year status post three-vessel bypass surgery (LIMA to LAD, SVG to second diagonal, SVG to distal right coronary artery posterolateral branch) and has not had any recurrent problems with coarse efficiency. He has mild ischemic cardiomyopathy with ejection fraction estimated to be 40-45% as and findings suggest a scar in the distribution of the distal right coronary artery. He presented with shortness of breath before surgery but he has not had clinically evident congestive heart failure since bypass or. He quit smoking at the time of bypass surgery and there has been significant improvement in his breathing although he likely still has residual COPD related abnormalities. Heass well treated dyslipidemia and hypertension.  A small infero-apical left ventricular thrombus was detected by echo but he has not been on anticoagulation for almost a year. We'll reassess this with another echo. He had perioperative atrial fibrillation but from a clinical standpoint there have been no recurrent episodes since then  He is due to have foot surgery with Dr. Elvin So. He has an ulcer or of the left hallux related to deviation of his toe.  Arterial Dopplers performed in our office 12/26/12 revealed a right ABI of 0.590 left ABI 0.48 with a high-frequency signal in his left common iliac artery. The patient does have pain in his left foot.    Current Outpatient Prescriptions  Medication Sig Dispense Refill  . acetaminophen (TYLENOL) 500 MG tablet Take 500 mg by mouth every 6 (six) hours as needed for pain.      Marland Kitchen albuterol (PROVENTIL HFA;VENTOLIN HFA) 108 (90 BASE) MCG/ACT inhaler Inhale 2 puffs into the lungs every 6 (six) hours as needed for wheezing.      Marland Kitchen aspirin 81 MG tablet Take 81 mg by mouth daily.      Marland Kitchen  atorvastatin (LIPITOR) 20 MG tablet Take 1 tablet (20 mg total) by mouth daily at 6 PM.      . bisacodyl (BISACODYL) 5 MG EC tablet Take 5 mg by mouth daily as needed for constipation.      Marland Kitchen HYDROcodone-acetaminophen (NORCO/VICODIN) 5-325 MG per tablet Take 1 tablet by mouth every 6 (six) hours as needed for pain.      Marland Kitchen ipratropium (ATROVENT HFA) 17 MCG/ACT inhaler Inhale 2 puffs into the lungs every 3 (three) days.       Marland Kitchen lisinopril (PRINIVIL,ZESTRIL) 2.5 MG tablet Take 2.5 mg by mouth daily.      . nitroGLYCERIN (NITROSTAT) 0.4 MG SL tablet Place 0.4 mg under the tongue every 5 (five) minutes as needed for chest pain.      . tamsulosin (FLOMAX) 0.4 MG CAPS Take 0.4 mg by mouth daily.      . traMADol (ULTRAM) 50 MG tablet Take 1 tablet (50 mg total) by mouth every 6 (six) hours as needed for pain.  60 tablet  1  . warfarin (COUMADIN) 5 MG tablet TAKE ONE TABLET BY MOUTH EVERY DAY  30 tablet  5  . zolpidem (AMBIEN CR) 6.25 MG CR tablet TAKE ONE TABLET BY MOUTH AT BEDTIME AS NEEDED FOR SLEEP  30 tablet  0   No current facility-administered medications for this visit.    No Known Allergies  History   Social History  . Marital Status: Widowed    Spouse Name: N/A    Number of Children: N/A  .  Years of Education: N/A   Occupational History  . Not on file.   Social History Main Topics  . Smoking status: Former Smoker -- 1.00 packs/day    Types: Cigarettes    Quit date: 01/13/2012  . Smokeless tobacco: Never Used  . Alcohol Use: No  . Drug Use: No  . Sexual Activity: Not on file   Other Topics Concern  . Not on file   Social History Narrative  . No narrative on file     Review of Systems: General: negative for chills, fever, night sweats or weight changes.  Cardiovascular: negative for chest pain, dyspnea on exertion, edema, orthopnea, palpitations, paroxysmal nocturnal dyspnea or shortness of breath Dermatological: negative for rash Respiratory: negative for cough or  wheezing Urologic: negative for hematuria Abdominal: negative for nausea, vomiting, diarrhea, bright red blood per rectum, melena, or hematemesis Neurologic: negative for visual changes, syncope, or dizziness All other systems reviewed and are otherwise negative except as noted above.    Blood pressure 118/70, pulse 76, height 5\' 6"  (1.676 m), weight 153 lb 12.8 oz (69.763 kg).  General appearance: alert and no distress Neck: no adenopathy, no carotid bruit, no JVD, supple, symmetrical, trachea midline and thyroid not enlarged, symmetric, no tenderness/mass/nodules Lungs: clear to auscultation bilaterally Heart: regular rate and rhythm, S1, S2 normal, no murmur, click, rub or gallop Extremities: extremities normal, atraumatic, no cyanosis or edema and ischemic ulcer tip of left great toe. Plus right and 1+ left femoral pulses.  EKG not performed today  ASSESSMENT AND PLAN:   No problem-specific assessment & plan notes found for this encounter.      Runell Gess MD FACP,FACC,FAHA, [facility] 02/10/2013 5:14 PM

## 2013-02-10 NOTE — Patient Instructions (Addendum)
Dr. Allyson Sabal has ordered a peripheral angiogram to be done at [facility].  This procedure is going to look at the bloodflow in your lower extremities.  If Dr. Allyson Sabal is able to open up the arteries, you will have to spend one night in the hospital.  If he is not able to open the arteries, you will be able to go home that same day.    After the procedure, you will not be allowed to drive for 3 days or push, pull, or lift anything greater than 10 lbs for one week.    REPS: Eric Left groin puncture  You will receive a phone call from [facility] the day before the procedure.  When they call you, please report to the hospital so that you can begin receiving IV fluids to prepare your kidneys for the upcoming catheterization.  If you have not received a phone call from the hospital by 2pm, please call our office and let us know.    Hold the lisinopril 2 days prior to the procedure Hold the coumadin 3 days prior to the procedure.  The procedure will be on day 4 of no coumadin

## 2013-02-11 ENCOUNTER — Encounter: Payer: Self-pay | Admitting: Cardiovascular Disease

## 2013-02-20 ENCOUNTER — Telehealth: Payer: Self-pay | Admitting: Cardiovascular Disease

## 2013-02-20 ENCOUNTER — Encounter (HOSPITAL_COMMUNITY): Payer: Self-pay | Admitting: Pharmacy Technician

## 2013-02-20 MED ORDER — NITROGLYCERIN 0.4 MG SL SUBL: 0.4000 mg | SUBLINGUAL_TABLET | SUBLINGUAL | Status: DC | PRN

## 2013-02-20 NOTE — Telephone Encounter (Signed)
Refills sent to pharmacy. 

## 2013-02-20 NOTE — Telephone Encounter (Signed)
Need a new perscriptiion for nitro 0.4 mg-Please call it in to [facility]-[phone]. This is the first time for this prescription from this office.

## 2013-02-25 ENCOUNTER — Ambulatory Visit: Payer: Medicare Other | Admitting: Pharmacist Clinician (PhC)/ Clinical Pharmacy Specialist

## 2013-03-02 ENCOUNTER — Encounter (HOSPITAL_COMMUNITY): Payer: Self-pay | Admitting: Cardiology

## 2013-03-02 ENCOUNTER — Inpatient Hospital Stay (HOSPITAL_COMMUNITY)
Admission: RE | Admit: 2013-03-02 | Discharge: 2013-03-04 | DRG: 301 | Disposition: A | Discharge status: Home / Self Care | Payer: Medicare Other | Source: Ambulatory Visit | Attending: Cardiovascular Disease | Admitting: Cardiovascular Disease

## 2013-03-02 DIAGNOSIS — E119 Type 2 diabetes mellitus without complications: Secondary | ICD-10-CM | POA: Diagnosis present

## 2013-03-02 DIAGNOSIS — I2510 Atherosclerotic heart disease of native coronary artery without angina pectoris: Secondary | ICD-10-CM | POA: Diagnosis not present

## 2013-03-02 DIAGNOSIS — Z79899 Other long term (current) drug therapy: Secondary | ICD-10-CM

## 2013-03-02 DIAGNOSIS — J4489 Other specified chronic obstructive pulmonary disease: Secondary | ICD-10-CM | POA: Diagnosis not present

## 2013-03-02 DIAGNOSIS — J449 Chronic obstructive pulmonary disease, unspecified: Secondary | ICD-10-CM | POA: Diagnosis present

## 2013-03-02 DIAGNOSIS — Z87891 Personal history of nicotine dependence: Secondary | ICD-10-CM | POA: Diagnosis not present

## 2013-03-02 DIAGNOSIS — I255 Ischemic cardiomyopathy: Secondary | ICD-10-CM | POA: Diagnosis present

## 2013-03-02 DIAGNOSIS — Z720 Tobacco use: Secondary | ICD-10-CM

## 2013-03-02 DIAGNOSIS — Z951 Presence of aortocoronary bypass graft: Secondary | ICD-10-CM | POA: Diagnosis not present

## 2013-03-02 DIAGNOSIS — I129 Hypertensive chronic kidney disease with stage 1 through stage 4 chronic kidney disease, or unspecified chronic kidney disease: Secondary | ICD-10-CM | POA: Diagnosis present

## 2013-03-02 DIAGNOSIS — N183 Chronic kidney disease, stage 3 unspecified: Secondary | ICD-10-CM

## 2013-03-02 DIAGNOSIS — Z7901 Long term (current) use of anticoagulants: Secondary | ICD-10-CM | POA: Diagnosis not present

## 2013-03-02 DIAGNOSIS — I70409 Unspecified atherosclerosis of autologous vein bypass graft(s) of the extremities, unspecified extremity: Secondary | ICD-10-CM | POA: Diagnosis present

## 2013-03-02 DIAGNOSIS — I238 Other current complications following acute myocardial infarction: Secondary | ICD-10-CM | POA: Diagnosis not present

## 2013-03-02 DIAGNOSIS — I70229 Atherosclerosis of native arteries of extremities with rest pain, unspecified extremity: Secondary | ICD-10-CM | POA: Diagnosis present

## 2013-03-02 DIAGNOSIS — I779 Disorder of arteries and arterioles, unspecified: Secondary | ICD-10-CM | POA: Diagnosis not present

## 2013-03-02 DIAGNOSIS — I70219 Atherosclerosis of native arteries of extremities with intermittent claudication, unspecified extremity: Secondary | ICD-10-CM | POA: Diagnosis not present

## 2013-03-02 DIAGNOSIS — I5189 Other ill-defined heart diseases: Secondary | ICD-10-CM | POA: Diagnosis present

## 2013-03-02 DIAGNOSIS — L97509 Non-pressure chronic ulcer of other part of unspecified foot with unspecified severity: Secondary | ICD-10-CM | POA: Diagnosis present

## 2013-03-02 DIAGNOSIS — E785 Hyperlipidemia, unspecified: Secondary | ICD-10-CM | POA: Diagnosis present

## 2013-03-02 DIAGNOSIS — I2589 Other forms of chronic ischemic heart disease: Secondary | ICD-10-CM | POA: Diagnosis present

## 2013-03-02 DIAGNOSIS — I219 Acute myocardial infarction, unspecified: Secondary | ICD-10-CM

## 2013-03-02 DIAGNOSIS — L84 Corns and callosities: Secondary | ICD-10-CM | POA: Diagnosis present

## 2013-03-02 DIAGNOSIS — Z9889 Other specified postprocedural states: Secondary | ICD-10-CM

## 2013-03-02 DIAGNOSIS — I252 Old myocardial infarction: Secondary | ICD-10-CM | POA: Diagnosis not present

## 2013-03-02 DIAGNOSIS — I739 Peripheral vascular disease, unspecified: Principal | ICD-10-CM | POA: Diagnosis present

## 2013-03-02 DIAGNOSIS — I998 Other disorder of circulatory system: Secondary | ICD-10-CM | POA: Diagnosis present

## 2013-03-02 DIAGNOSIS — Z7982 Long term (current) use of aspirin: Secondary | ICD-10-CM

## 2013-03-02 DIAGNOSIS — L98499 Non-pressure chronic ulcer of skin of other sites with unspecified severity: Secondary | ICD-10-CM | POA: Diagnosis not present

## 2013-03-02 DIAGNOSIS — I236 Thrombosis of atrium, auricular appendage, and ventricle as current complications following acute myocardial infarction: Secondary | ICD-10-CM | POA: Diagnosis present

## 2013-03-02 LAB — COMPREHENSIVE METABOLIC PANEL
ALT: 21 U/L (ref 0–53)
AST: 22 U/L (ref 0–37)
CO2: 22 mEq/L (ref 19–32)
Calcium: 9.3 mg/dL (ref 8.4–10.5)
GFR calc non Af Amer: 59 mL/min — ABNORMAL LOW (ref >90)
Potassium: 4.3 mEq/L (ref 3.5–5.1)
Sodium: 136 mEq/L (ref 135–145)

## 2013-03-02 LAB — CBC WITH DIFFERENTIAL/PLATELET
Eosinophils Absolute: 0.3 10*3/uL (ref 0.0–0.7)
Eosinophils Relative: 4 % (ref 0–5)
Hemoglobin: 15.1 g/dL (ref 13.0–17.0)
Lymphs Abs: 2 10*3/uL (ref 0.7–4.0)
MCH: 31.9 pg (ref 26.0–34.0)
MCV: 92.4 fL (ref 78.0–100.0)
Monocytes Relative: 8 % (ref 3–12)
Platelets: 135 10*3/uL — ABNORMAL LOW (ref 150–400)
RBC: 4.74 MIL/uL (ref 4.22–5.81)

## 2013-03-02 LAB — GLUCOSE, CAPILLARY: Glucose-Capillary: 96 mg/dL (ref 70–99)

## 2013-03-02 MED ORDER — NITROGLYCERIN 0.4 MG SL SUBL: 0.4000 mg | SUBLINGUAL_TABLET | SUBLINGUAL | Status: DC | PRN

## 2013-03-02 MED ORDER — SODIUM CHLORIDE 0.9 % IV SOLN: 250.0000 mL | INTRAVENOUS | Status: DC | PRN

## 2013-03-02 MED ORDER — ASPIRIN EC 81 MG PO TBEC: 81.0000 mg | DELAYED_RELEASE_TABLET | Freq: Every day | ORAL | Status: DC

## 2013-03-02 MED ORDER — ASPIRIN 81 MG PO CHEW
324.0000 mg | CHEWABLE_TABLET | ORAL | Status: AC
  Administered 2013-03-03: 324 mg via ORAL
  Filled 2013-03-02: qty 4

## 2013-03-02 MED ORDER — HEPARIN BOLUS VIA INFUSION
3500.0000 [IU] | Freq: Once | INTRAVENOUS | Status: AC
  Administered 2013-03-02: 3500 [IU] via INTRAVENOUS
  Filled 2013-03-02: qty 3500

## 2013-03-02 MED ORDER — SODIUM CHLORIDE 0.9 % IV SOLN
INTRAVENOUS | Status: DC
  Administered 2013-03-02: 18:00:00 via INTRAVENOUS

## 2013-03-02 MED ORDER — ZOLPIDEM TARTRATE 5 MG PO TABS
5.0000 mg | ORAL_TABLET | Freq: Every evening | ORAL | Status: DC | PRN
  Administered 2013-03-03: 5 mg via ORAL
  Filled 2013-03-02: qty 1

## 2013-03-02 MED ORDER — SODIUM CHLORIDE 0.9 % IJ SOLN: 3.0000 mL | INTRAMUSCULAR | Status: DC | PRN

## 2013-03-02 MED ORDER — TAMSULOSIN HCL 0.4 MG PO CAPS
0.4000 mg | ORAL_CAPSULE | Freq: Every day | ORAL | Status: DC
  Administered 2013-03-02 – 2013-03-03 (×2): 0.4 mg via ORAL
  Filled 2013-03-02 (×5): qty 1

## 2013-03-02 MED ORDER — ALBUTEROL SULFATE HFA 108 (90 BASE) MCG/ACT IN AERS
2.0000 | INHALATION_SPRAY | Freq: Four times a day (QID) | RESPIRATORY_TRACT | Status: DC | PRN
  Filled 2013-03-02: qty 6.7

## 2013-03-02 MED ORDER — ATORVASTATIN CALCIUM 20 MG PO TABS
20.0000 mg | ORAL_TABLET | Freq: Every day | ORAL | Status: DC
  Administered 2013-03-02 – 2013-03-03 (×2): 20 mg via ORAL
  Filled 2013-03-02 (×4): qty 1

## 2013-03-02 MED ORDER — HYDROCODONE-ACETAMINOPHEN 5-325 MG PO TABS: 1.0000 | ORAL_TABLET | Freq: Four times a day (QID) | ORAL | Status: DC | PRN

## 2013-03-02 MED ORDER — ACETAMINOPHEN 500 MG PO TABS
500.0000 mg | ORAL_TABLET | Freq: Four times a day (QID) | ORAL | Status: DC | PRN
  Filled 2013-03-02: qty 1

## 2013-03-02 MED ORDER — SODIUM CHLORIDE 0.9 % IV SOLN
INTRAVENOUS | Status: DC
  Administered 2013-03-02: 20:00:00 via INTRAVENOUS

## 2013-03-02 MED ORDER — BISACODYL 5 MG PO TBEC
5.0000 mg | DELAYED_RELEASE_TABLET | Freq: Every day | ORAL | Status: DC | PRN
  Filled 2013-03-02: qty 1

## 2013-03-02 MED ORDER — HEPARIN (PORCINE) IN NACL 100-0.45 UNIT/ML-% IJ SOLN
1100.0000 [IU]/h | INTRAMUSCULAR | Status: DC
  Administered 2013-03-02: 1100 [IU]/h via INTRAVENOUS
  Filled 2013-03-02 (×2): qty 250

## 2013-03-02 MED ORDER — SODIUM CHLORIDE 0.9 % IJ SOLN: 3.0000 mL | Freq: Two times a day (BID) | INTRAMUSCULAR | Status: DC

## 2013-03-02 MED ORDER — IPRATROPIUM BROMIDE HFA 17 MCG/ACT IN AERS
2.0000 | INHALATION_SPRAY | RESPIRATORY_TRACT | Status: DC | PRN
  Filled 2013-03-02: qty 12.9

## 2013-03-02 MED ORDER — SODIUM CHLORIDE 0.9 % IJ SOLN
3.0000 mL | Freq: Two times a day (BID) | INTRAMUSCULAR | Status: DC
  Administered 2013-03-02: 3 mL via INTRAVENOUS

## 2013-03-02 MED ORDER — ASPIRIN EC 81 MG PO TBEC
81.0000 mg | DELAYED_RELEASE_TABLET | Freq: Every day | ORAL | Status: DC
  Administered 2013-03-02: 81 mg via ORAL
  Filled 2013-03-02 (×3): qty 1

## 2013-03-02 NOTE — H&P (Signed)
See Dr. Hazle Coca H & P/Office note 02/10/2013.       Subjective: No complaints. Pt comes in early for IV hydration.   Objective: Vital signs in last 24 hours: Temp:  [98.8 F (37.1 C)] 98.8 F (37.1 C) (09/07 1518) Pulse Rate:  [75] 75 (09/07 1518) Resp:  [18] 18 (09/07 1518) BP: (118)/(63) 118/63 mmHg (09/07 1518) SpO2:  [97 %] 97 % (09/07 1518) Weight change:    Intake/Output from previous day:   Intake/Output this shift:    PE:  General:Pleasant affect, NAD, speech is slightly slow which is old Skin:Warm and dry, brisk capillary refill HEENT:normocephalic, sclera clear, mucus membranes moist Neck:supple, no JVD, no bruits  Heart:S1S2 RRR without murmur, gallup, rub or click Lungs:clear without rales, rhonchi, or wheezes HYQ:MVHQ, non tender, + BS, do not palpate liver spleen or masses Ext:no lower ext edema, 2+ radial pulses, ulcer lt great toe Neuro:alert and oriented, MAE, follows commands, + facial symmetry    Lab Results  Component Value Date   CHOL 141 01/14/2012   HDL 30* 01/14/2012   LDLCALC 62 01/14/2012   TRIG 246* 01/14/2012   CHOLHDL 4.7 01/14/2012   Lab Results  Component Value Date   HGBA1C 5.3 01/20/2012     Lab Results  Component Value Date   TSH 0.589 01/14/2012       Assessment/Plan: Principal Problem:   Critical lower limb ischemia Active Problems:   Left ventricular apical thrombus following MI, NSTEMI, 01/13/12   DM (diabetes mellitus)   Tobacco abuse   Ischemic cardiomyopathy, EF 40-45% 2D 01/15/12   S/P CABG x 3     01/23/12   S/P carotid endarterectomy   Lt, 01/23/12   Long term (current) use of anticoagulants   CKD (chronic kidney disease) stage 3, GFR 30-59 ml/min  PLAN: brought in early for IV hydration, prior to PV angio.  Will check labs. Will ask wound nurse to check toe.  Will add IV heparin with hx of apical thrombus overnight.  Lisinopril last dose 03/01/13.  Last dose coumadin 02/26/13.    LOS: 0 days   Time spent with pt. :  15 minutes. [facility] R  Nurse Practitioner Certified Pager [phone] 03/02/2013, 3:54 PM

## 2013-03-02 NOTE — H&P (Signed)
Patient is being admitted for pre-LE Angio hydration with L grt toe ulcer.   He is essentially stable. Seen along with Ms. Annie Paras, MD.   ICM, ~ 1 yr s/p CABGx3. He does have LV Apical thrombus on warfarin (that has been held). ABIs L = 0.48,R 0.59 --> ulcer on L hallux --> pending Sgx by Dr. Elvin So.  PV Angio planned - to see if revascularization options are available to help with post-op heeling. Due to CKD - being pre-admitted for hydration. ACE-I held 2 d pre-procedure. Warfarin held 4 day pre-procedure.  Pre-cath order have been written.   The procedure was discussed with the patient in the clinic.  Marykay Lex, M.D., M.S. [facility] [address] [place], [place]  [address]  [phone] Pager # [phone] 03/02/2013 4:42 PM

## 2013-03-02 NOTE — Progress Notes (Signed)
ANTICOAGULATION CONSULT NOTE - Initial Consult  Pharmacy Consult for Heparin Indication: apical thrombus (coumadin held for procedure)  No Known Allergies Patient Measurements: Weight- 69.8 kg Height- 168 cm IBW- 64.1 kg Heparin Dosing Weight: 69.8 kg Vital Signs: Temp: 98.8 F (37.1 C) (09/07 1518) Temp src: Oral (09/07 1518) BP: 118/63 mmHg (09/07 1518) Pulse Rate: 75 (09/07 1518) Labs: No results found for this basename: HGB, HCT, PLT, APTT, LABPROT, INR, HEPARINUNFRC, CREATININE, CKTOTAL, CKMB, TROPONINI,  in the last 72 hours The CrCl is unknown because both a height and weight (above a minimum accepted value) are required for this calculation.  Medical History: Past Medical History  Diagnosis Date  . Hypertension   . Diabetes mellitus   . H/O ascending aorta repair   . Stroke   . Hematuria 01/15/2012  . Staghorn renal calculus 01/15/2012  . Left ventricular apical thrombus following MI, NSTEMI, 01/13/12 01/15/2012  . DM (diabetes mellitus) 01/15/2012  . Thrombocytopenia   . COPD (chronic obstructive pulmonary disease)   . CAD (coronary artery disease)   . S/P CABG x 3 01/22/2012    LIMA to LAD,SVG to second diagonal,SVG to distal RCA  . Dyslipidemia   . Ischemic cardiomyopathy   . LV (left ventricular) mural thrombus     inferapical  . Atrial fibrillation     perioperative  . Carotid stenosis     bilateral  . Critical lower limb ischemia    Medications:  Prescriptions prior to admission  Medication Sig Dispense Refill  . acetaminophen (TYLENOL) 500 MG tablet Take 500 mg by mouth every 6 (six) hours as needed for pain.      Marland Kitchen albuterol (PROVENTIL HFA;VENTOLIN HFA) 108 (90 BASE) MCG/ACT inhaler Inhale 2 puffs into the lungs every 6 (six) hours as needed for wheezing.      Marland Kitchen aspirin EC 81 MG tablet Take 81 mg by mouth daily.      Marland Kitchen atorvastatin (LIPITOR) 20 MG tablet Take 20 mg by mouth daily at 6 PM.      . bisacodyl (BISACODYL) 5 MG EC tablet Take 5 mg by mouth  daily as needed for constipation.      Marland Kitchen HYDROcodone-acetaminophen (NORCO/VICODIN) 5-325 MG per tablet Take 1 tablet by mouth every 6 (six) hours as needed for pain.      Marland Kitchen ipratropium (ATROVENT HFA) 17 MCG/ACT inhaler Inhale 2 puffs into the lungs every 3 (three) days.       Marland Kitchen lisinopril (PRINIVIL,ZESTRIL) 5 MG tablet Take 2.5 mg by mouth daily.      . Naftifine HCl (NAFTIN) 2 % GEL Apply 1 application topically daily as needed.      . nitroGLYCERIN (NITROSTAT) 0.4 MG SL tablet Place 0.4 mg under the tongue every 5 (five) minutes as needed for chest pain.      . tamsulosin (FLOMAX) 0.4 MG CAPS Take 0.4 mg by mouth daily.      . traMADol (ULTRAM) 50 MG tablet Take 50 mg by mouth every 6 (six) hours as needed for pain.      Marland Kitchen warfarin (COUMADIN) 5 MG tablet Take 2.5-5 mg by mouth daily. Take 2.5 mg on Monday, Tuesday, Thursday, Friday, and Saturday.  Take 5 mg on Wednesday, and Sunday.      . zolpidem (AMBIEN CR) 6.25 MG CR tablet Take 6.25 mg by mouth at bedtime as needed for sleep.       Assessment: 63 YOM with history of recent apical thrombus (01/13/12) on Coumadin as outpatient but  this was held for [facility] angio. Last dose of Coumadin was 02/26/13. To start IV heparin to bridge to PV angio. INR today is 1.52. H/H wnl. Plts 135.   Goal of Therapy:  Heparin level 0.3-0.7 units/ml Monitor platelets by anticoagulation protocol: Yes   Plan:  1. Heparin bolus of 3500 unit bolus, then start drip at 1100 units/hr. 2. Heparin level in 6 hours.  3. Follow-up post- PV angio.    Link Snuffer, PharmD, BCPS Clinical Pharmacist [phone] 03/02/2013,4:16 PM

## 2013-03-03 ENCOUNTER — Encounter (HOSPITAL_COMMUNITY)
Admission: RE | Disposition: A | Discharge status: Home / Self Care | Payer: Medicare Other | Source: Ambulatory Visit | Attending: Cardiovascular Disease

## 2013-03-03 DIAGNOSIS — I70219 Atherosclerosis of native arteries of extremities with intermittent claudication, unspecified extremity: Secondary | ICD-10-CM

## 2013-03-03 HISTORY — PX: ABDOMINAL ANGIOGRAM: SHX5499

## 2013-03-03 HISTORY — PX: LOWER EXTREMITY ANGIOGRAM: SHX5508

## 2013-03-03 LAB — CBC
Platelets: 127 10*3/uL — ABNORMAL LOW (ref 150–400)
RBC: 4.49 MIL/uL (ref 4.22–5.81)
WBC: 6 10*3/uL (ref 4.0–10.5)

## 2013-03-03 LAB — GLUCOSE, CAPILLARY
Glucose-Capillary: 114 mg/dL — ABNORMAL HIGH (ref 70–99)
Glucose-Capillary: 109 mg/dL — ABNORMAL HIGH (ref 70–99)
Glucose-Capillary: 164 mg/dL — ABNORMAL HIGH (ref 70–99)

## 2013-03-03 LAB — BASIC METABOLIC PANEL
CO2: 22 mEq/L (ref 19–32)
Calcium: 8.7 mg/dL (ref 8.4–10.5)
GFR calc non Af Amer: 56 mL/min — ABNORMAL LOW (ref >90)
Sodium: 136 mEq/L (ref 135–145)

## 2013-03-03 LAB — PROTIME-INR: Prothrombin Time: 18.2 seconds — ABNORMAL HIGH (ref 11.6–15.2)

## 2013-03-03 SURGERY — ANGIOGRAM, LOWER EXTREMITY
Anesthesia: LOCAL | Laterality: Left

## 2013-03-03 MED ORDER — TRAMADOL HCL 50 MG PO TABS: 50.0000 mg | ORAL_TABLET | Freq: Four times a day (QID) | ORAL | Status: DC | PRN

## 2013-03-03 MED ORDER — HEPARIN (PORCINE) IN NACL 2-0.9 UNIT/ML-% IJ SOLN
INTRAMUSCULAR | Status: AC
  Filled 2013-03-03: qty 1000

## 2013-03-03 MED ORDER — ONDANSETRON HCL 4 MG/2ML IJ SOLN: 4.0000 mg | Freq: Four times a day (QID) | INTRAMUSCULAR | Status: DC | PRN

## 2013-03-03 MED ORDER — MORPHINE SULFATE 2 MG/ML IJ SOLN: 2.0000 mg | INTRAMUSCULAR | Status: DC | PRN

## 2013-03-03 MED ORDER — ALPRAZOLAM 0.25 MG PO TABS
0.2500 mg | ORAL_TABLET | Freq: Three times a day (TID) | ORAL | Status: DC | PRN
  Administered 2013-03-03: 14:00:00 0.25 mg via ORAL
  Filled 2013-03-03: qty 1

## 2013-03-03 MED ORDER — ACETAMINOPHEN 325 MG PO TABS: 650.0000 mg | ORAL_TABLET | ORAL | Status: DC | PRN

## 2013-03-03 MED ORDER — SODIUM CHLORIDE 0.9 % IV SOLN
INTRAVENOUS | Status: AC
  Administered 2013-03-03: 11:00:00 via INTRAVENOUS

## 2013-03-03 MED ORDER — NICOTINE 7 MG/24HR TD PT24
7.0000 mg | MEDICATED_PATCH | Freq: Every day | TRANSDERMAL | Status: DC
  Administered 2013-03-03 – 2013-03-04 (×2): 7 mg via TRANSDERMAL
  Filled 2013-03-03 (×2): qty 1

## 2013-03-03 MED ORDER — LIDOCAINE HCL (PF) 1 % IJ SOLN
INTRAMUSCULAR | Status: AC
  Filled 2013-03-03: qty 30

## 2013-03-03 NOTE — CV Procedure (Signed)
BONNY EGGER is a 72 y.o. male    [id number] LOCATION:  FACILITY: [facility]  PHYSICIAN: Nanetta Batty, M.D. 11-Sep-1940   DATE OF PROCEDURE:  03/03/2013  DATE OF DISCHARGE:   CARDIAC CATHETERIZATION     History obtained from chart review.Mr. Grieshop is now almost one year status post three-vessel bypass surgery (LIMA to LAD, SVG to second diagonal, SVG to distal right coronary artery posterolateral branch) and has not had any recurrent problems with coarse efficiency. He has mild ischemic cardiomyopathy with ejection fraction estimated to be 40-45% as and findings suggest a scar in the distribution of the distal right coronary artery. He presented with shortness of breath before surgery but he has not had clinically evident congestive heart failure since bypass or. He quit smoking at the time of bypass surgery and there has been significant improvement in his breathing although he likely still has residual COPD related abnormalities. Heass well treated dyslipidemia and hypertension.  A small infero-apical left ventricular thrombus was detected by echo but he has not been on anticoagulation for almost a year. We'll reassess this with another echo. He had perioperative atrial fibrillation but from a clinical standpoint there have been no recurrent episodes since then  He is due to have foot surgery with Dr. Elvin So. He has an ulcer or of the left hallux related to deviation of his toe.  Arterial Dopplers performed in our office 12/26/12 revealed a right ABI of 0.590 left ABI 0.48 with a high-frequency signal in his left common iliac artery. The patient does have pain in his left foot.    PROCEDURE DESCRIPTION:    The patient was brought to the second floor [facility] cath lab in the postabsorptive state. He was premedicated with Valium 5 mg by mouth. His right groinwas prepped and shaved in usual sterile fashion. Xylocaine 1% was used  for local anesthesia. A 5 French sheath was inserted  into the right common femoral artery using standard Seldinger technique.a 5 French pigtail catheter was used for abdominal aortography with bifemoral runoff using bolus chase a digital subtraction step staple technique. Visipaque dye was used for the entirety of the case (125 cc delivered the patient). Retrograde aortic pressure was monitored during the case.   HEMODYNAMICS:    AO SYSTOLIC/AO DIASTOLIC: 154/62    ANGIOGRAPHIC RESULTS:   1. Abdominal aortogram-the distal aortobifemoral bypass graft was patent as was the right limb. The left limb was occluded at its origin.  2: Left lower extremity-there was reconstitution of the left external iliac artery by collaterals. The left SFA had a 70-80% segmental calcified stenosis in the midportion with three-vessel runoff to the foot.  3: right lower extremity-the right limb of the aortobifem was widely patent. The right SFA was occluded in its midportion. There was a 90% focal above-the-knee popliteal stenosis as well as occlusion of the anterior tibial and tibioperoneal trunk.  IMPRESSION:Mr. Kernodle has an occluded left limb of his aortobifemoral bypass graft responsible for his critical limb ischemia. I do not think he is a candidate for any surgical procedure on his foot because of the risk of nonhealing. I believe he would need surgical correction of his left limb versus right the left fem-fem crossover graft. Because he was on Coumadin prior to admission I will re\re heparinize and coumadinize him and arrange for a surgeon to see him.  Runell Gess MD, [facility] 03/03/2013 9:58 AM

## 2013-03-03 NOTE — Progress Notes (Signed)
Utilization Review Completed Jadesola Poynter J. Kelson Queenan, RN, BSN, NCM [phone]  

## 2013-03-03 NOTE — Progress Notes (Signed)
ANTICOAGULATION CONSULT NOTE  Pharmacy Consult for Heparin Indication: apical thrombus  No Known Allergies Patient Measurements: Weight- 69.8 kg Height- 168 cm IBW- 64.1 kg Heparin Dosing Weight: 69.8 kg Vital Signs: Temp: 98.3 F (36.8 C) (09/07 2137) Temp src: Oral (09/07 2137) BP: 122/53 mmHg (09/07 2137) Pulse Rate: 76 (09/07 2137) Labs:  Recent Labs  03/02/13 1708 03/03/13 0100  HGB 15.1 14.3  HCT 43.8 41.4  PLT 135* 127*  APTT 32  --   LABPROT 17.9* 18.2*  INR 1.52* 1.55*  HEPARINUNFRC  --  0.30  CREATININE 1.20 1.25   Estimated Creatinine Clearance: 48.9 ml/min (by C-G formula based on Cr of 1.25).  Assessment: 72 YO Male  with history of apical thrombus , Coumadin on hold for angiogram today, for heparin  Goal of Therapy:  Heparin level 0.3-0.7 units/ml Monitor platelets by anticoagulation protocol: Yes   Plan:  Continue Heparin at current rate  F/U after procedure  Geannie Risen, PharmD, BCPS  03/03/2013,2:14 AM

## 2013-03-03 NOTE — H&P (Signed)
   Pt was reexamined and existing H & P reviewed. No changes found.  Runell Gess, MD [facility] 03/03/2013 9:14 AM

## 2013-03-03 NOTE — Consult Note (Signed)
WOC consult Note Reason for Consult: Consult requested for left great toe wound.  Area does not have an open wound or drainage.  It is a dry intact callous to plantar surface of toe; 1X.5cm.  No topical care needed at this time.  Pt states he has antibacterial ointment he puts on when at home to soften the site and avoid cracking.  Agree with this plan of care.  Encouraged to begin follow-up with podiatrist after discharge to trim callous; and educated pt to avoid performing this activity on his own. Please re-consult if further assistance is needed.  Thank-you,  Cammie Mcgee MSN, RN, CWOCN, [place], CNS [phone]

## 2013-03-04 ENCOUNTER — Telehealth: Payer: Self-pay | Admitting: Pharmacist Clinician (PhC)/ Clinical Pharmacy Specialist

## 2013-03-04 DIAGNOSIS — I2510 Atherosclerotic heart disease of native coronary artery without angina pectoris: Secondary | ICD-10-CM

## 2013-03-04 DIAGNOSIS — Z9889 Other specified postprocedural states: Secondary | ICD-10-CM

## 2013-03-04 DIAGNOSIS — Z7901 Long term (current) use of anticoagulants: Secondary | ICD-10-CM

## 2013-03-04 DIAGNOSIS — I779 Disorder of arteries and arterioles, unspecified: Secondary | ICD-10-CM

## 2013-03-04 DIAGNOSIS — F17200 Nicotine dependence, unspecified, uncomplicated: Secondary | ICD-10-CM

## 2013-03-04 DIAGNOSIS — I999 Unspecified disorder of circulatory system: Secondary | ICD-10-CM

## 2013-03-04 DIAGNOSIS — J449 Chronic obstructive pulmonary disease, unspecified: Secondary | ICD-10-CM

## 2013-03-04 LAB — GLUCOSE, CAPILLARY: Glucose-Capillary: 126 mg/dL — ABNORMAL HIGH (ref 70–99)

## 2013-03-04 LAB — CBC
Hemoglobin: 14.9 g/dL (ref 13.0–17.0)
MCH: 32.4 pg (ref 26.0–34.0)
MCV: 91.1 fL (ref 78.0–100.0)
Platelets: 140 10*3/uL — ABNORMAL LOW (ref 150–400)
RBC: 4.6 MIL/uL (ref 4.22–5.81)
WBC: 6.8 10*3/uL (ref 4.0–10.5)

## 2013-03-04 LAB — BASIC METABOLIC PANEL
CO2: 21 mEq/L (ref 19–32)
Calcium: 9.1 mg/dL (ref 8.4–10.5)
Chloride: 105 mEq/L (ref 96–112)
Glucose, Bld: 118 mg/dL — ABNORMAL HIGH (ref 70–99)
Sodium: 138 mEq/L (ref 135–145)

## 2013-03-04 MED ORDER — NICOTINE 7 MG/24HR TD PT24: 1.0000 | MEDICATED_PATCH | Freq: Every day | TRANSDERMAL | Status: DC

## 2013-03-04 NOTE — Progress Notes (Signed)
Subjective:  No CP/SOB  Objective:  Temp:  [97.5 F (36.4 C)-98.2 F (36.8 C)] 97.7 F (36.5 C) (09/09 0802) Pulse Rate:  [66-78] 78 (09/09 0802) Resp:  [13-18] 18 (09/09 0802) BP: (147-177)/(52-80) 168/75 mmHg (09/09 0802) SpO2:  [95 %-100 %] 96 % (09/09 0802) Weight:  [160 lb 0.9 oz (72.6 kg)] 160 lb 0.9 oz (72.6 kg) (09/09 0047) Weight change: 1 lb 5.2 oz (0.6 kg)  Intake/Output from previous day: 09/08 0701 - 09/09 0700 In: 1092.5 [P.O.:480; I.V.:612.5] Out: 1370 [Urine:1370]  Intake/Output from this shift:    Physical Exam: General appearance: alert and no distress Neck: no adenopathy, no carotid bruit, no JVD, supple, symmetrical, trachea midline and thyroid not enlarged, symmetric, no tenderness/mass/nodules Lungs: clear to auscultation bilaterally Heart: regular rate and rhythm, S1, S2 normal, no murmur, click, rub or gallop Extremities: extremities normal, atraumatic, no cyanosis or edema and Right groin puncture site OK  Lab Results: Results for orders placed during the hospital encounter of 03/02/13 (from the past 48 hour(s))  GLUCOSE, CAPILLARY     Status: None   Collection Time    03/02/13  4:54 PM      Result Value Range   Glucose-Capillary 96  70 - 99 mg/dL   Comment 1 Notify RN    COMPREHENSIVE METABOLIC PANEL     Status: Abnormal   Collection Time    03/02/13  5:08 PM      Result Value Range   Sodium 136  135 - 145 mEq/L   Potassium 4.3  3.5 - 5.1 mEq/L   Chloride 102  96 - 112 mEq/L   CO2 22  19 - 32 mEq/L   Glucose, Bld 102 (*) 70 - 99 mg/dL   BUN 15  6 - 23 mg/dL   Creatinine, Ser 1.61  0.50 - 1.35 mg/dL   Calcium 9.3  8.4 - 09.6 mg/dL   Total Protein 6.6  6.0 - 8.3 g/dL   Albumin 3.5  3.5 - 5.2 g/dL   AST 22  0 - 37 U/L   ALT 21  0 - 53 U/L   Alkaline Phosphatase 73  39 - 117 U/L   Total Bilirubin 0.4  0.3 - 1.2 mg/dL   GFR calc non Af Amer 59 (*) >90 mL/min   GFR calc Af Amer 68 (*) >90 mL/min   Comment: (NOTE)     The eGFR has  been calculated using the CKD EPI equation.     This calculation has not been validated in all clinical situations.     eGFR's persistently <90 mL/min signify possible Chronic Kidney     Disease.  MAGNESIUM     Status: None   Collection Time    03/02/13  5:08 PM      Result Value Range   Magnesium 2.1  1.5 - 2.5 mg/dL  CBC WITH DIFFERENTIAL     Status: Abnormal   Collection Time    03/02/13  5:08 PM      Result Value Range   WBC 7.6  4.0 - 10.5 K/uL   RBC 4.74  4.22 - 5.81 MIL/uL   Hemoglobin 15.1  13.0 - 17.0 g/dL   HCT 04.5  40.9 - 81.1 %   MCV 92.4  78.0 - 100.0 fL   MCH 31.9  26.0 - 34.0 pg   MCHC 34.5  30.0 - 36.0 g/dL   RDW 91.4  78.2 - 95.6 %   Platelets 135 (*) 150 - 400 K/uL  Neutrophils Relative % 61  43 - 77 %   Neutro Abs 4.6  1.7 - 7.7 K/uL   Lymphocytes Relative 27  12 - 46 %   Lymphs Abs 2.0  0.7 - 4.0 K/uL   Monocytes Relative 8  3 - 12 %   Monocytes Absolute 0.6  0.1 - 1.0 K/uL   Eosinophils Relative 4  0 - 5 %   Eosinophils Absolute 0.3  0.0 - 0.7 K/uL   Basophils Relative 1  0 - 1 %   Basophils Absolute 0.0  0.0 - 0.1 K/uL  PROTIME-INR     Status: Abnormal   Collection Time    03/02/13  5:08 PM      Result Value Range   Prothrombin Time 17.9 (*) 11.6 - 15.2 seconds   INR 1.52 (*) 0.00 - 1.49  APTT     Status: None   Collection Time    03/02/13  5:08 PM      Result Value Range   aPTT 32  24 - 37 seconds  GLUCOSE, CAPILLARY     Status: Abnormal   Collection Time    03/02/13 11:29 PM      Result Value Range   Glucose-Capillary 106 (*) 70 - 99 mg/dL  BASIC METABOLIC PANEL     Status: Abnormal   Collection Time    03/03/13  1:00 AM      Result Value Range   Sodium 136  135 - 145 mEq/L   Potassium 3.8  3.5 - 5.1 mEq/L   Chloride 101  96 - 112 mEq/L   CO2 22  19 - 32 mEq/L   Glucose, Bld 141 (*) 70 - 99 mg/dL   BUN 15  6 - 23 mg/dL   Creatinine, Ser 6.29  0.50 - 1.35 mg/dL   Calcium 8.7  8.4 - 52.8 mg/dL   GFR calc non Af Amer 56 (*) >90 mL/min    GFR calc Af Amer 65 (*) >90 mL/min   Comment: (NOTE)     The eGFR has been calculated using the CKD EPI equation.     This calculation has not been validated in all clinical situations.     eGFR's persistently <90 mL/min signify possible Chronic Kidney     Disease.  CBC     Status: Abnormal   Collection Time    03/03/13  1:00 AM      Result Value Range   WBC 6.0  4.0 - 10.5 K/uL   RBC 4.49  4.22 - 5.81 MIL/uL   Hemoglobin 14.3  13.0 - 17.0 g/dL   HCT 41.3  24.4 - 01.0 %   MCV 92.2  78.0 - 100.0 fL   MCH 31.8  26.0 - 34.0 pg   MCHC 34.5  30.0 - 36.0 g/dL   RDW 27.2  53.6 - 64.4 %   Platelets 127 (*) 150 - 400 K/uL  PROTIME-INR     Status: Abnormal   Collection Time    03/03/13  1:00 AM      Result Value Range   Prothrombin Time 18.2 (*) 11.6 - 15.2 seconds   INR 1.55 (*) 0.00 - 1.49  HEPARIN LEVEL (UNFRACTIONATED)     Status: None   Collection Time    03/03/13  1:00 AM      Result Value Range   Heparin Unfractionated 0.30  0.30 - 0.70 IU/mL   Comment:            IF HEPARIN RESULTS ARE  BELOW     EXPECTED VALUES, AND PATIENT     DOSAGE HAS BEEN CONFIRMED,     SUGGEST FOLLOW UP TESTING     OF ANTITHROMBIN III LEVELS.  GLUCOSE, CAPILLARY     Status: Abnormal   Collection Time    03/03/13  8:10 AM      Result Value Range   Glucose-Capillary 114 (*) 70 - 99 mg/dL   Comment 1 Documented in Chart     Comment 2 Notify RN    POCT ACTIVATED CLOTTING TIME     Status: None   Collection Time    03/03/13  9:41 AM      Result Value Range   Activated Clotting Time 130    GLUCOSE, CAPILLARY     Status: Abnormal   Collection Time    03/03/13 10:13 AM      Result Value Range   Glucose-Capillary 102 (*) 70 - 99 mg/dL  GLUCOSE, CAPILLARY     Status: Abnormal   Collection Time    03/03/13  5:42 PM      Result Value Range   Glucose-Capillary 109 (*) 70 - 99 mg/dL  GLUCOSE, CAPILLARY     Status: Abnormal   Collection Time    03/03/13  9:45 PM      Result Value Range    Glucose-Capillary 164 (*) 70 - 99 mg/dL   Comment 1 Notify RN     Comment 2 Documented in Chart    CBC     Status: Abnormal   Collection Time    03/04/13  6:35 AM      Result Value Range   WBC 6.8  4.0 - 10.5 K/uL   RBC 4.60  4.22 - 5.81 MIL/uL   Hemoglobin 14.9  13.0 - 17.0 g/dL   HCT 56.2  13.0 - 86.5 %   MCV 91.1  78.0 - 100.0 fL   MCH 32.4  26.0 - 34.0 pg   MCHC 35.6  30.0 - 36.0 g/dL   RDW 78.4  69.6 - 29.5 %   Platelets 140 (*) 150 - 400 K/uL  BASIC METABOLIC PANEL     Status: Abnormal   Collection Time    03/04/13  6:35 AM      Result Value Range   Sodium 138  135 - 145 mEq/L   Potassium 3.9  3.5 - 5.1 mEq/L   Chloride 105  96 - 112 mEq/L   CO2 21  19 - 32 mEq/L   Glucose, Bld 118 (*) 70 - 99 mg/dL   BUN 13  6 - 23 mg/dL   Creatinine, Ser 2.84  0.50 - 1.35 mg/dL   Calcium 9.1  8.4 - 13.2 mg/dL   GFR calc non Af Amer 63 (*) >90 mL/min   GFR calc Af Amer 73 (*) >90 mL/min   Comment: (NOTE)     The eGFR has been calculated using the CKD EPI equation.     This calculation has not been validated in all clinical situations.     eGFR's persistently <90 mL/min signify possible Chronic Kidney     Disease.  GLUCOSE, CAPILLARY     Status: Abnormal   Collection Time    03/04/13  8:03 AM      Result Value Range   Glucose-Capillary 126 (*) 70 - 99 mg/dL    Imaging: Imaging results have been reviewed  Assessment/Plan:   1. Principal Problem: 2.   Critical lower limb ischemia 3. Active Problems: 4.  Tobacco abuse 5.   Left ventricular apical thrombus following MI, NSTEMI, 01/13/12 6.   DM (diabetes mellitus) 7.   Ischemic cardiomyopathy, EF 40-45% 2D 01/15/12 8.   S/P CABG x 3     01/23/12 9.   S/P carotid endarterectomy   Lt, 01/23/12 10.   Long term (current) use of anticoagulants 11.   CKD (chronic kidney disease) stage 3, GFR 30-59 ml/min 12.   Time Spent Directly with Patient:  20 minutes  Length of Stay:  LOS: 2 days   S/P PV angio yesterday revealing an  occluded left limb of ABFBG. He has an ischemic ulcer of left halux. Only revascularization options are surgical. He also as an old, laminated LV apical mural thrombus on 2D on chronic coumadin A/C which was help prior to his procedure. I think it will be OK to D/C home on coumadin without hep or lovenox bridging. I have left a VM with Dr. Elvin So (his Podiatrist) to discuss a strategy. ROV with me 1-2 weeks.   BERRY,JONATHAN J 03/04/2013, 8:30 AM

## 2013-03-04 NOTE — Progress Notes (Signed)
Patient ambulated in hall independently with steady gait using front wheel walker. Tolerated well with no complaints of chest pain, shortness of breath or leg pain. Uses walker with 4 wheels at home.

## 2013-03-04 NOTE — Telephone Encounter (Signed)
Pt had angiogram on Monday-please call need his instructions for his coumadin.-Also when does he need his next appt?

## 2013-03-04 NOTE — Telephone Encounter (Signed)
Per hospital d/c pt to restart warfarin tonight at normal dose, will check INR with JB appt in 3 weeks

## 2013-03-04 NOTE — Discharge Summary (Signed)
Physician Discharge Summary  Patient ID: Charles Hatfield MRN: [id number] DOB/AGE: [birth year] 72 y.o.  Admit date: 03/02/2013 Discharge date: 03/04/2013  Admission Diagnoses: Critical lower limb ischemia   Discharge Diagnoses:  Principal Problem:   Critical lower limb ischemia Active Problems:   Tobacco abuse   Left ventricular apical thrombus following MI, NSTEMI, 01/13/12   DM (diabetes mellitus)   Ischemic cardiomyopathy, EF 40-45% 2D 01/15/12   S/P CABG x 3     01/23/12   S/P carotid endarterectomy   Lt, 01/23/12   Long term (current) use of anticoagulants   CKD (chronic kidney disease) stage 3, GFR 30-59 ml/min   Discharged Condition: stable  Hospital Course: The patient is a 72 y/o male with a history of CAD, s/p CABG in 2013, mild ischemic cardiomyopathy with an EF estimated to be 40-45% and findings suggestive of a scar in the distribution of the distal right coronary artery. A small infero-apical left ventricular thrombus was detected on prior echo. He also has COPD and treated dyslipidemia and hypertension. He [facility] to [facility]/02/13 to undergo a PV angiogram for critical lower limb ischemia. He has a painful ischemic ulcer on the left great toe. Arterial Dopplers performed in our office on 12/26/12 revealed a right ABI of 0.59 and a left ABI of 0.48 with a high-frequency signal in his left common iliac artery. He presented the day before the procedure for pre-cath hydration. The procedure was performed by Dr. Allyson Sabal. He was found to have an occluded left limb of his aortobifemoral bypass graft responsible for his critical limb ischemia.  Dr. Allyson Sabal did not think he was a candidate for any surgical procedure on his foot because of the risk of nonhealing. He believed he would need surgical correction of his left limb versus right the left fem-fem crossover graft. It was decided to have the patient follow up with a surgeon for further care. The patient left the cath lab in stable condition.  He had no post-cath complications and the femoral access site remained stable. His renal function remained stable. Prior to discharge, a 2 D echo was obtained to re-evaluate the left ventricular thrombus. The thrombus was visualized, but was noted to be an old, laminated LV apical mural thrombus. It was decided to resume Coumadin without heparin or Lovenox bridging. The patient was placed back on his home dose of Coumadin. He was last seen and examined by Dr. Allyson Sabal, who determined that he was stable for discharge home. He will follow-up with Dr. Allyson Sabal on 03/26/13.    Consults: None  Significant Diagnostic Studies:   PV Angiogram HEMODYNAMICS:  AO SYSTOLIC/AO DIASTOLIC: 154/62  ANGIOGRAPHIC RESULTS:  1. Abdominal aortogram-the distal aortobifemoral bypass graft was patent as was the right limb. The left limb was occluded at its origin.  2: Left lower extremity-there was reconstitution of the left external iliac artery by collaterals. The left SFA had a 70-80% segmental calcified stenosis in the midportion with three-vessel runoff to the foot.  3: right lower extremity-the right limb of the aortobifem was widely patent. The right SFA was occluded in its midportion. There was a 90% focal above-the-knee popliteal stenosis as well as occlusion of the anterior tibial and tibioperoneal trunk.    Treatments: See Hospital Course  Discharge Exam: Blood pressure 168/75, pulse 78, temperature 97.7 F (36.5 C), temperature source Oral, resp. rate 18, height 5\' 6"  (1.676 m), weight 160 lb 0.9 oz (72.6 kg), SpO2 96.00%.   Disposition: 01-Home or Self Care  Discharge Orders   Future Appointments Provider Department Dept Phone   03/26/2013 10:15 AM Runell Gess, MD [facility] [place] [phone]   Future Orders Complete By Expires   Diet - low sodium heart healthy  As directed    Driving Restrictions  As directed    Comments:     No driving for 3 days    Increase activity slowly  As directed    Lifting restrictions  As directed    Comments:     No lifin       Medication List         acetaminophen 500 MG tablet  Commonly known as:  TYLENOL  Take 500 mg by mouth every 6 (six) hours as needed for pain.     albuterol 108 (90 BASE) MCG/ACT inhaler  Commonly known as:  PROVENTIL HFA;VENTOLIN HFA  Inhale 2 puffs into the lungs every 6 (six) hours as needed for wheezing.     aspirin EC 81 MG tablet  Take 81 mg by mouth daily.     atorvastatin 20 MG tablet  Commonly known as:  LIPITOR  Take 20 mg by mouth daily at 6 PM.     bisacodyl 5 MG EC tablet  Generic drug:  bisacodyl  Take 5 mg by mouth daily as needed for constipation.     HYDROcodone-acetaminophen 5-325 MG per tablet  Commonly known as:  NORCO/VICODIN  Take 1 tablet by mouth every 6 (six) hours as needed for pain.     ipratropium 17 MCG/ACT inhaler  Commonly known as:  ATROVENT HFA  Inhale 2 puffs into the lungs every 3 (three) days.     lisinopril 5 MG tablet  Commonly known as:  PRINIVIL,ZESTRIL  Take 2.5 mg by mouth daily.     NAFTIN 2 % Gel  Generic drug:  Naftifine HCl  Apply 1 application topically daily as needed.     nicotine 7 mg/24hr patch  Commonly known as:  NICODERM CQ - dosed in mg/24 hr  Place 1 patch onto the skin daily.     nitroGLYCERIN 0.4 MG SL tablet  Commonly known as:  NITROSTAT  Place 0.4 mg under the tongue every 5 (five) minutes as needed for chest pain.     tamsulosin 0.4 MG Caps capsule  Commonly known as:  FLOMAX  Take 0.4 mg by mouth daily.     traMADol 50 MG tablet  Commonly known as:  ULTRAM  Take 50 mg by mouth every 6 (six) hours as needed for pain.     warfarin 5 MG tablet  Commonly known as:  COUMADIN  Take 2.5-5 mg by mouth daily. Take 2.5 mg on Monday, Tuesday, Thursday, Friday, and Saturday.  Take 5 mg on Wednesday, and Sunday.     zolpidem 6.25 MG CR tablet  Commonly known as:  AMBIEN CR  Take 6.25 mg by mouth  at bedtime as needed for sleep.       Follow-up Information   Follow up with Runell Gess, MD On 03/26/2013. (10:15 am)    Specialty:  Cardiology   Contact information:   [address] [place] [address] [phone]      TIME SPENT ON DISCHARGE, INCLUDING PHYSICIAN TIME: >30 MINUTES  Signed: Allayne Butcher, PA-C 03/04/2013, 11:28 AM

## 2013-03-18 ENCOUNTER — Other Ambulatory Visit: Payer: Self-pay | Admitting: Cardiovascular Disease

## 2013-03-19 NOTE — Telephone Encounter (Signed)
Rx was sent to pharmacy electronically. 

## 2013-03-24 DIAGNOSIS — L84 Corns and callosities: Secondary | ICD-10-CM | POA: Diagnosis not present

## 2013-03-24 DIAGNOSIS — L608 Other nail disorders: Secondary | ICD-10-CM | POA: Diagnosis not present

## 2013-03-24 DIAGNOSIS — I739 Peripheral vascular disease, unspecified: Secondary | ICD-10-CM | POA: Diagnosis not present

## 2013-03-24 DIAGNOSIS — E1159 Type 2 diabetes mellitus with other circulatory complications: Secondary | ICD-10-CM | POA: Diagnosis not present

## 2013-03-24 DIAGNOSIS — L97509 Non-pressure chronic ulcer of other part of unspecified foot with unspecified severity: Secondary | ICD-10-CM | POA: Diagnosis not present

## 2013-03-26 ENCOUNTER — Ambulatory Visit: Payer: Medicare Other | Admitting: Pharmacist Clinician (PhC)/ Clinical Pharmacy Specialist

## 2013-03-26 ENCOUNTER — Ambulatory Visit: Payer: Medicare Other | Admitting: Cardiovascular Disease

## 2013-03-31 ENCOUNTER — Encounter: Payer: Self-pay | Admitting: Cardiovascular Disease

## 2013-03-31 ENCOUNTER — Other Ambulatory Visit: Payer: Self-pay | Admitting: Cardiovascular Disease

## 2013-03-31 ENCOUNTER — Ambulatory Visit (INDEPENDENT_AMBULATORY_CARE_PROVIDER_SITE_OTHER): Payer: Medicare Other | Admitting: Pharmacist Clinician (PhC)/ Clinical Pharmacy Specialist

## 2013-03-31 ENCOUNTER — Ambulatory Visit (INDEPENDENT_AMBULATORY_CARE_PROVIDER_SITE_OTHER): Payer: Medicare Other | Admitting: Cardiovascular Disease

## 2013-03-31 VITALS — BP 150/70 | HR 76 | Ht 66.0 in | Wt 157.4 lb

## 2013-03-31 DIAGNOSIS — I238 Other current complications following acute myocardial infarction: Secondary | ICD-10-CM | POA: Diagnosis not present

## 2013-03-31 DIAGNOSIS — Z7901 Long term (current) use of anticoagulants: Secondary | ICD-10-CM

## 2013-03-31 DIAGNOSIS — I999 Unspecified disorder of circulatory system: Secondary | ICD-10-CM | POA: Diagnosis not present

## 2013-03-31 DIAGNOSIS — I219 Acute myocardial infarction, unspecified: Secondary | ICD-10-CM | POA: Diagnosis not present

## 2013-03-31 DIAGNOSIS — I998 Other disorder of circulatory system: Secondary | ICD-10-CM

## 2013-03-31 DIAGNOSIS — I236 Thrombosis of atrium, auricular appendage, and ventricle as current complications following acute myocardial infarction: Secondary | ICD-10-CM

## 2013-03-31 LAB — POCT INR: INR: 2.5

## 2013-03-31 NOTE — Progress Notes (Signed)
03/31/2013 Charles Hatfield   10-14-1940  [id number]  Primary Physician Default, Provider, MD Primary Cardiologist: Runell Gess MD [name]   HPI:  *Charles Hatfield is now almost one year status post three-vessel bypass surgery (LIMA to LAD, SVG to second diagonal, SVG to distal right coronary artery posterolateral branch) and has not had any recurrent problems with coarse efficiency. He has mild ischemic cardiomyopathy with ejection fraction estimated to be 40-45% as and findings suggest a scar in the distribution of the distal right coronary artery. He presented with shortness of breath before surgery but he has not had clinically evident congestive heart failure since bypass or. He quit smoking at the time of bypass surgery and there has been significant improvement in his breathing although he likely still has residual COPD related abnormalities. Heass well treated dyslipidemia and hypertension.  A small infero-apical left ventricular thrombus was detected by echo but he has not been on anticoagulation for almost a year. We'll reassess this with another echo. He had perioperative atrial fibrillation but from a clinical standpoint there have been no recurrent episodes since then  He is due to have foot surgery with Dr. Elvin So. He has an ulcer or of the left hallux related to deviation of his toe.  Arterial Dopplers performed in our office 12/26/12 revealed a right ABI of 0.590 left ABI 0.48 with a high-frequency signal in his left common iliac artery. The patient does have pain in his left foot. I perform peripheral angiography on 03/02/13 revealing an occluded left limb of his aortobifemoral bypass graft. An occluded right SFA, 80% segmental calcified mid left SFA with three-vessel runoff. He really only has a callus on his left great toe but no evidence of gangrene. At this point I do not feel that he is but cannot have any local surgical procedure on his left foot unless he is  completely revascularized to promote healing.    Current Outpatient Prescriptions  Medication Sig Dispense Refill  . acetaminophen (TYLENOL) 500 MG tablet Take 500 mg by mouth every 6 (six) hours as needed for pain.      Marland Kitchen albuterol (PROVENTIL HFA;VENTOLIN HFA) 108 (90 BASE) MCG/ACT inhaler Inhale 2 puffs into the lungs every 6 (six) hours as needed for wheezing.      Marland Kitchen amoxicillin-clavulanate (AUGMENTIN) 875-125 MG per tablet Take 1 tablet by mouth 2 (two) times daily.      Marland Kitchen aspirin EC 81 MG tablet Take 81 mg by mouth daily.      Marland Kitchen atorvastatin (LIPITOR) 20 MG tablet TAKE ONE TABLET BY MOUTH EVERY DAY  30 tablet  10  . bisacodyl (BISACODYL) 5 MG EC tablet Take 5 mg by mouth daily as needed for constipation.      Marland Kitchen ipratropium (ATROVENT HFA) 17 MCG/ACT inhaler Inhale 2 puffs into the lungs every 3 (three) days.       Marland Kitchen lisinopril (PRINIVIL,ZESTRIL) 5 MG tablet Take 0.5 tablets (2.5 mg total) by mouth daily.  15 tablet  10  . Naftifine HCl (NAFTIN) 2 % GEL Apply 1 application topically daily as needed.      . nicotine (NICODERM CQ - DOSED IN MG/24 HR) 7 mg/24hr patch Place 1 patch onto the skin daily.  28 patch  1  . nitroGLYCERIN (NITROSTAT) 0.4 MG SL tablet Place 0.4 mg under the tongue every 5 (five) minutes as needed for chest pain.      . tamsulosin (FLOMAX) 0.4 MG CAPS Take 0.4 mg by  mouth daily.      . traMADol (ULTRAM) 50 MG tablet Take 50 mg by mouth every 6 (six) hours as needed for pain.      Marland Kitchen warfarin (COUMADIN) 5 MG tablet Take 2.5-5 mg by mouth daily. Take 2.5 mg on Monday, Tuesday, Thursday, Friday, and Saturday.  Take 5 mg on Wednesday, and Sunday.      . zolpidem (AMBIEN CR) 6.25 MG CR tablet Take 6.25 mg by mouth at bedtime as needed for sleep.       No current facility-administered medications for this visit.    No Known Allergies  History   Social History  . Marital Status: Widowed    Spouse Name: N/A    Number of Children: N/A  . Years of Education: N/A    Occupational History  . Not on file.   Social History Main Topics  . Smoking status: Former Smoker -- 1.00 packs/day    Types: Cigarettes    Quit date: 01/13/2012  . Smokeless tobacco: Never Used  . Alcohol Use: No  . Drug Use: No  . Sexual Activity: No   Other Topics Concern  . Not on file   Social History Narrative  . No narrative on file     Review of Systems: General: negative for chills, fever, night sweats or weight changes.  Cardiovascular: negative for chest pain, dyspnea on exertion, edema, orthopnea, palpitations, paroxysmal nocturnal dyspnea or shortness of breath Dermatological: negative for rash Respiratory: negative for cough or wheezing Urologic: negative for hematuria Abdominal: negative for nausea, vomiting, diarrhea, bright red blood per rectum, melena, or hematemesis Neurologic: negative for visual changes, syncope, or dizziness All other systems reviewed and are otherwise negative except as noted above.    Blood pressure 150/70, pulse 76, height 5\' 6"  (1.676 m), weight 157 lb 6.4 oz (71.396 kg).  General appearance: alert and no distress Neck: no adenopathy, no carotid bruit, no JVD, supple, symmetrical, trachea midline and thyroid not enlarged, symmetric, no tenderness/mass/nodules Lungs: clear to auscultation bilaterally Heart: regular rate and rhythm, S1, S2 normal, no murmur, click, rub or gallop Extremities: extremities normal, atraumatic, no cyanosis or edema and absent pedal pulses  EKG not performed today  ASSESSMENT AND PLAN:   Critical lower limb ischemia Because of a callus on his left foot he was referred to me by Dr. [facility]. Lower gemmae Dopplers performed 12/26/12 revealed a right ABI 0.59 and a left ABI of 0.48. I perform peripheral angiography on him 03/02/13 via the right femoral approach. He is status post remote aortobifemoral bypass grafting. The left limb of his aortobifemoral bypass graft was occluded. He  reconstituted his femoral artery by collaterals and had an 80% calcified segmental mid left SFA stenosis with three-vessel runoff. He had an occluded right SFA, occluded anterior tibial and tibioperoneal trunk. I believe he is high risk for any surgical procedure on his left foot do to poor circulation though at this point I examined his foot and do not think he has critical limb ischemia. Should he require any surgical procedure he would need his aortobifem revised and/or a fem-fem crossover graft. I will see him back when necessary.      Runell Gess MD FACP,FACC,FAHA, [facility] 03/31/2013 12:22 PM

## 2013-03-31 NOTE — Patient Instructions (Addendum)
NEED AN APPOINTMENT WITH Dr Royann Shivers in DEC 2014  Your physician recommends that you schedule a follow-up appointment on as needed basis with Dr Allyson Sabal.

## 2013-03-31 NOTE — Telephone Encounter (Signed)
Rx was sent to pharmacy electronically. 

## 2013-03-31 NOTE — Assessment & Plan Note (Signed)
Because of a callus on his left foot he was referred to me by Dr. Elvin So from [facility]. Lower gemmae Dopplers performed 12/26/12 revealed a right ABI 0.59 and a left ABI of 0.48. I perform peripheral angiography on him 03/02/13 via the right femoral approach. He is status post remote aortobifemoral bypass grafting. The left limb of his aortobifemoral bypass graft was occluded. He reconstituted his femoral artery by collaterals and had an 80% calcified segmental mid left SFA stenosis with three-vessel runoff. He had an occluded right SFA, occluded anterior tibial and tibioperoneal trunk. I believe he is high risk for any surgical procedure on his left foot do to poor circulation though at this point I examined his foot and do not think he has critical limb ischemia. Should he require any surgical procedure he would need his aortobifem revised and/or a fem-fem crossover graft. I will see him back when necessary.

## 2013-04-01 ENCOUNTER — Other Ambulatory Visit: Payer: Self-pay | Admitting: *Deleted

## 2013-04-01 MED ORDER — TRAMADOL HCL 50 MG PO TABS: 50.0000 mg | ORAL_TABLET | Freq: Four times a day (QID) | ORAL | Status: DC | PRN

## 2013-05-12 ENCOUNTER — Other Ambulatory Visit: Payer: Self-pay | Admitting: Cardiovascular Disease

## 2013-05-21 ENCOUNTER — Telehealth: Payer: Self-pay | Admitting: Cardiovascular Disease

## 2013-05-21 MED ORDER — TRAMADOL HCL 50 MG PO TABS: ORAL_TABLET | ORAL | Status: DC

## 2013-05-21 NOTE — Telephone Encounter (Signed)
Called pharmacy to verify order that was authorized on 05/12/13. This refill for tramadol 50mg  #60 with zero refills was never received. Gave V/O for this medication

## 2013-05-21 NOTE — Telephone Encounter (Signed)
Need refill on his Tramasol 50 mg #60

## 2013-05-21 NOTE — Telephone Encounter (Signed)
Pharmacy never received notification of refill request authorized on 05/12/13 - phoned in tramadol 50mg  q6h prn for pain #60 with zero refills

## 2013-05-26 ENCOUNTER — Ambulatory Visit: Payer: Medicare Other | Admitting: Pharmacist Clinician (PhC)/ Clinical Pharmacy Specialist

## 2013-05-26 ENCOUNTER — Ambulatory Visit: Payer: Medicare Other | Admitting: Cardiovascular Disease

## 2013-06-12 ENCOUNTER — Ambulatory Visit (INDEPENDENT_AMBULATORY_CARE_PROVIDER_SITE_OTHER): Payer: Medicare Other | Admitting: Cardiovascular Disease

## 2013-06-12 ENCOUNTER — Ambulatory Visit (INDEPENDENT_AMBULATORY_CARE_PROVIDER_SITE_OTHER): Payer: Medicare Other | Admitting: Pharmacist Clinician (PhC)/ Clinical Pharmacy Specialist

## 2013-06-12 ENCOUNTER — Encounter: Payer: Self-pay | Admitting: Cardiovascular Disease

## 2013-06-12 VITALS — BP 140/80 | HR 78 | Resp 20 | Ht 66.0 in | Wt 158.0 lb

## 2013-06-12 DIAGNOSIS — Z9889 Other specified postprocedural states: Secondary | ICD-10-CM

## 2013-06-12 DIAGNOSIS — I10 Essential (primary) hypertension: Secondary | ICD-10-CM

## 2013-06-12 DIAGNOSIS — I998 Other disorder of circulatory system: Secondary | ICD-10-CM

## 2013-06-12 DIAGNOSIS — N200 Calculus of kidney: Secondary | ICD-10-CM

## 2013-06-12 DIAGNOSIS — I238 Other current complications following acute myocardial infarction: Secondary | ICD-10-CM | POA: Diagnosis not present

## 2013-06-12 DIAGNOSIS — I219 Acute myocardial infarction, unspecified: Secondary | ICD-10-CM | POA: Diagnosis not present

## 2013-06-12 DIAGNOSIS — I236 Thrombosis of atrium, auricular appendage, and ventricle as current complications following acute myocardial infarction: Secondary | ICD-10-CM

## 2013-06-12 DIAGNOSIS — I70229 Atherosclerosis of native arteries of extremities with rest pain, unspecified extremity: Secondary | ICD-10-CM

## 2013-06-12 DIAGNOSIS — I999 Unspecified disorder of circulatory system: Secondary | ICD-10-CM

## 2013-06-12 DIAGNOSIS — E782 Mixed hyperlipidemia: Secondary | ICD-10-CM

## 2013-06-12 DIAGNOSIS — L84 Corns and callosities: Secondary | ICD-10-CM | POA: Diagnosis not present

## 2013-06-12 DIAGNOSIS — Z7901 Long term (current) use of anticoagulants: Secondary | ICD-10-CM

## 2013-06-12 DIAGNOSIS — I739 Peripheral vascular disease, unspecified: Secondary | ICD-10-CM | POA: Diagnosis not present

## 2013-06-12 DIAGNOSIS — I2589 Other forms of chronic ischemic heart disease: Secondary | ICD-10-CM

## 2013-06-12 DIAGNOSIS — I2510 Atherosclerotic heart disease of native coronary artery without angina pectoris: Secondary | ICD-10-CM

## 2013-06-12 DIAGNOSIS — L608 Other nail disorders: Secondary | ICD-10-CM | POA: Diagnosis not present

## 2013-06-12 DIAGNOSIS — I255 Ischemic cardiomyopathy: Secondary | ICD-10-CM

## 2013-06-12 MED ORDER — LISINOPRIL 5 MG PO TABS: 5.0000 mg | ORAL_TABLET | Freq: Every day | ORAL | Status: DC

## 2013-06-12 NOTE — Patient Instructions (Signed)
Increase Lisinopril to 5mg  daily.  Your physician recommends that you schedule a follow-up appointment in: One year.

## 2013-06-15 DIAGNOSIS — E782 Mixed hyperlipidemia: Secondary | ICD-10-CM | POA: Insufficient documentation

## 2013-06-15 NOTE — Assessment & Plan Note (Signed)
Hard to  say whether his episodes of mild chest discomfort for truly angina, especially since they occurred at rest and resolve spontaneously. Reevaluation of his coronaries does not appear to be justified at this time, continue with risk factor modification, nitrates when necessary

## 2013-06-15 NOTE — Progress Notes (Signed)
Patient ID: Charles Hatfield, male   DOB: 08-30-1940, 72 y.o.   MRN: [id number]      Reason for office visit Followup CAD status post CABG  Charles Hatfield is now about 16 months status post coronary bypass surgery and has mild ischemic cardiomyopathy with a left ventricular ejection fraction of 40-45%(inferior scar, apical septal dyskinesia with associated thrombus). He has done quite well. Over the last couple of months has had 2 mild episodes of chest discomfort that did not require nitroglycerin. He has not had shortness of breath, edema or dizziness. He has successfully quit smoking. His major complaint is persistent mild left flank discomfort that is not related to meals or activity. He is known to have a staghorn calculus in the left renal pelvis. At the time of bypass surgery she also underwent left carotid endarterectomy. He denies interval focal neurological deficits. Has a remote history of aortoiliac bypass surgery. Arterial Dopplers performed in our office 12/26/12 revealed a right ABI of 0.59, left ABI 0.48 with a high-frequency signal in his left common iliac artery. The patient does have pain in his left foot. Peripheral angiography on 03/02/13 revealing an occluded left limb of his aortobifemoral bypass graft, an occluded right SFA, 80% segmental calcified mid left SFA with three-vessel runoff. Percutaneous revascularization was not possible and since he did not appear to have limb threatening ischemia he was not referred for redo surgery. He had a callus on his great toe of the left foot, but this has "fallen off". There is a tiny 1-2 mm lesion in the tip of his left great toe that seems to be healing, but is tender.  No Known Allergies  Current Outpatient Prescriptions  Medication Sig Dispense Refill  . acetaminophen (TYLENOL) 500 MG tablet Take 500 mg by mouth every 6 (six) hours as needed for pain.      Marland Kitchen albuterol (PROVENTIL HFA;VENTOLIN HFA) 108 (90 BASE) MCG/ACT inhaler Inhale 2 puffs  into the lungs every 6 (six) hours as needed for wheezing.      Marland Kitchen aspirin EC 81 MG tablet Take 81 mg by mouth daily.      Marland Kitchen atorvastatin (LIPITOR) 20 MG tablet TAKE ONE TABLET BY MOUTH EVERY DAY  30 tablet  10  . bisacodyl (BISACODYL) 5 MG EC tablet Take 5 mg by mouth daily as needed for constipation.      Marland Kitchen HYDROcodone-acetaminophen (NORCO/VICODIN) 5-325 MG per tablet Take 1 tablet by mouth every 6 (six) hours as needed for moderate pain.      Marland Kitchen ipratropium (ATROVENT HFA) 17 MCG/ACT inhaler Inhale 2 puffs into the lungs every 3 (three) days.       Marland Kitchen lisinopril (PRINIVIL,ZESTRIL) 5 MG tablet Take 1 tablet (5 mg total) by mouth daily.  30 tablet  10  . Naftifine HCl (NAFTIN) 2 % GEL Apply 1 application topically daily as needed.      . nicotine (NICODERM CQ - DOSED IN MG/24 HR) 7 mg/24hr patch Place 1 patch onto the skin daily.  28 patch  1  . nitroGLYCERIN (NITROSTAT) 0.4 MG SL tablet Place 0.4 mg under the tongue every 5 (five) minutes as needed for chest pain.      . tamsulosin (FLOMAX) 0.4 MG CAPS Take 0.4 mg by mouth daily.      . traMADol (ULTRAM) 50 MG tablet TAKE ONE TABLET BY MOUTH EVERY 6 HOURS AS NEEDED FOR PAIN  60 tablet  0  . warfarin (COUMADIN) 5 MG tablet Take 2.5-5 mg  by mouth daily. Take 2.5 mg on Monday, Tuesday, Thursday, Friday, and Saturday.  Take 5 mg on Wednesday, and Sunday.      . zolpidem (AMBIEN CR) 6.25 MG CR tablet Take 6.25 mg by mouth at bedtime as needed for sleep.       No current facility-administered medications for this visit.    Past Medical History  Diagnosis Date  . Hypertension   . Diabetes mellitus   . H/O ascending aorta repair   . Stroke   . Hematuria 01/15/2012  . Staghorn renal calculus 01/15/2012  . Left ventricular apical thrombus following MI, NSTEMI, 01/13/12 01/15/2012  . DM (diabetes mellitus) 01/15/2012  . Thrombocytopenia   . COPD (chronic obstructive pulmonary disease)   . CAD (coronary artery disease)   . S/P CABG x 3 01/22/2012    LIMA  to LAD,SVG to second diagonal,SVG to distal RCA  . Dyslipidemia   . Ischemic cardiomyopathy   . LV (left ventricular) mural thrombus     inferapical  . Atrial fibrillation     perioperative  . Carotid stenosis     bilateral  . Critical lower limb ischemia     Past Surgical History  Procedure Laterality Date  . Repair thoracic aorta      Ascending thoracic aorto repair  . Coronary artery bypass graft  01/22/2012    Procedure: CORONARY ARTERY BYPASS GRAFTING (CABG);  Surgeon: Kerin Perna, MD;  Location: [facility] OR;  Service: Open Heart Surgery;  Laterality: N/A;  CABG x three using left internal mammary artery and right leg greater saphenous vein harvested endoscopically  . Endarterectomy  01/22/2012    Procedure: ENDARTERECTOMY CAROTID;  Surgeon: Larina Earthly, MD;  Location: [facility] OR;  Service: Vascular;  Laterality: Left;  . Carotid endarterectomy  01/13/2012  . Nm myoview ltd  02/20/2012    infarct/scar w/mild perinfarct ischemia, EF 38%, abnormal nuc, no sign. ischemia    Family History  Problem Relation Age of Onset  . Heart disease Father   . Heart attack Brother     History   Social History  . Marital Status: Widowed    Spouse Name: N/A    Number of Children: N/A  . Years of Education: N/A   Occupational History  . Not on file.   Social History Main Topics  . Smoking status: Former Smoker -- 1.00 packs/day    Types: Cigarettes    Quit date: 01/13/2012  . Smokeless tobacco: Never Used  . Alcohol Use: No  . Drug Use: No  . Sexual Activity: No   Other Topics Concern  . Not on file   Social History Narrative  . No narrative on file    Review of systems: Other than the 2 episodes of chest discomfort described above he has not had angina or shortness of breath. His left great toe slightly tender at the tip but seems to be improving quickly. Otherwise he is feeling well. The patient specifically denies any dyspnea at rest or with exertion, orthopnea, paroxysmal  nocturnal dyspnea, syncope, palpitations, focal neurological deficits, intermittent claudication, lower extremity edema, unexplained weight gain, cough, hemoptysis or wheezing.  The patient also denies abdominal pain, nausea, vomiting, dysphagia, diarrhea, constipation, polyuria, polydipsia, dysuria, hematuria, frequency, urgency, abnormal bleeding or bruising, fever, chills, unexpected weight changes, mood swings, change in skin or hair texture, change in voice quality, auditory or visual problems, allergic reactions or rashes, new musculoskeletal complaints other than usual "aches and pains".   PHYSICAL EXAM BP  140/80  Pulse 78  Ht 5\' 6"  (1.676 m)  Wt 158 lb (71.668 kg)  BMI 25.51 kg/m2 General: Alert, oriented x3, no distress  Head: no evidence of trauma, PERRL, EOMI, no exophtalmos or lid lag, no myxedema, no xanthelasma; normal ears, nose and oropharynx  Neck: normal jugular venous pulsations and no hepatojugular reflux; brisk carotid pulses without delay and no carotid bruits; healed left carotid endarterectomy scar  Chest: clear to auscultation, but generally reduced and distant breath sounds, no signs of consolidation by percussion or palpation, normal fremitus, symmetrical and full respiratory excursions; healed sternotomy scar  Cardiovascular: normal position and quality of the apical impulse, regular rhythm, normal first and second heart sounds, no murmurs, rubs or gallops  Abdomen: no tenderness or distention, no masses by palpation, no abnormal pulsatility or arterial bruits, normal bowel sounds, no hepatosplenomegaly  Extremities: no clubbing, cyanosis or edema; 2+ radial, ulnar and brachial pulses bilaterally; 2+ right femoral, thready posterior tibial and dorsalis pedis pulses; 2+ left femoral, thready posterior tibial and dorsalis pedis pulses; no subclavian; bilateral femoral bruits are present 1-2 mm scab on left great toe, no surrounding redness or warmth Neurological: grossly  nonfocal   EKG: Sinus rhythm, old inferior infarction, old anterolateral infarction (Q waves in leads 2,3,F and V3 to V6)  Lipid Panel     Component Value Date/Time   CHOL 141 01/14/2012 0600   TRIG 246* 01/14/2012 0600   HDL 30* 01/14/2012 0600   CHOLHDL 4.7 01/14/2012 0600   VLDL 49* 01/14/2012 0600   LDLCALC 62 01/14/2012 0600    BMET    Component Value Date/Time   NA 138 03/04/2013 0635   K 3.9 03/04/2013 0635   CL 105 03/04/2013 0635   CO2 21 03/04/2013 0635   GLUCOSE 118* 03/04/2013 0635   BUN 13 03/04/2013 0635   CREATININE 1.14 03/04/2013 0635   CALCIUM 9.1 03/04/2013 0635   GFRNONAA 63* 03/04/2013 0635   GFRAA 73* 03/04/2013 0635     ASSESSMENT AND PLAN CAD (coronary artery disease) status post CABG July 2013 Hard to  say whether his episodes of mild chest discomfort for truly angina, especially since they occurred at rest and resolve spontaneously. Reevaluation of his coronaries does not appear to be justified at this time, continue with risk factor modification, nitrates when necessary  Left ventricular apical thrombus following MI, NSTEMI, 01/13/12 On chronic warfarin  Ischemic cardiomyopathy, EF 40-45% 2D 01/15/12 No signs of heart failure at this time. He is on ACE inhibitors and beta blockers are being avoided because of COPD. He is euvolemic despite no loop diuretics. 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 No neurological complaints at this time  Critical lower limb ischemia The ulcer on his left great toe seems to be healing. He has a very poor ABI in both feet, especially bad on the left but today there does not appear to be any signs of threatened limb loss. The only way to provide revascularization would be redo aortoiliac surgery or a femoral-femoral bypass, indicated if he becomes more  symptomatic.  Staghorn renal calculus This might be the cause of his left flank pain  HTN (hypertension) Recheck his blood pressure was a little low at 137/76 mm Hg. No changes were made to his medicines today  Mixed  hyperlipidemia LDL cholesterol is in the desirable range but his triglycerides were high at his last assay. We'll repeat a lipid profile in a few months. Restrict carbohydrates and simple sugars and starches with low glycemic index   Orders Placed This Encounter  Procedures  . EKG 12-Lead   Meds ordered this encounter  Medications  . HYDROcodone-acetaminophen (NORCO/VICODIN) 5-325 MG per tablet    Sig: Take 1 tablet by mouth every 6 (six) hours as needed for moderate pain.  Marland Kitchen lisinopril (PRINIVIL,ZESTRIL) 5 MG tablet    Sig: Take 1 tablet (5 mg total) by mouth daily.    Dispense:  30 tablet    Refill:  [address]  Thurmon Fair, MD, [facility] [phone] office [phone] pager

## 2013-06-15 NOTE — Assessment & Plan Note (Signed)
On chronic warfarin 

## 2013-06-15 NOTE — Assessment & Plan Note (Signed)
This might be the cause of his left flank pain

## 2013-06-15 NOTE — Assessment & Plan Note (Signed)
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 No neurological complaints at this time

## 2013-06-15 NOTE — Assessment & Plan Note (Signed)
No signs of heart failure at this time. He is on ACE inhibitors and beta blockers are being avoided because of COPD. He is euvolemic despite no loop diuretics. NYHA functional class II, dyspnea may be just as well related to COPD as it could be from CHF

## 2013-06-15 NOTE — Assessment & Plan Note (Signed)
Recheck his blood pressure was a little low at 137/76 mm Hg. No changes were made to his medicines today

## 2013-06-15 NOTE — Assessment & Plan Note (Signed)
LDL cholesterol is in the desirable range but his triglycerides were high at his last assay. We'll repeat a lipid profile in a few months. Restrict carbohydrates and simple sugars and starches with low glycemic index

## 2013-06-15 NOTE — Assessment & Plan Note (Signed)
The ulcer on his left great toe seems to be healing. He has a very poor ABI in both feet, especially bad on the left but today there does not appear to be any signs of threatened limb loss. The only way to provide revascularization would be redo aortoiliac surgery or a femoral-femoral bypass, indicated if he becomes more symptomatic.

## 2013-06-24 ENCOUNTER — Other Ambulatory Visit: Payer: Self-pay | Admitting: Cardiovascular Disease

## 2013-06-25 ENCOUNTER — Other Ambulatory Visit: Payer: Self-pay | Admitting: *Deleted

## 2013-06-25 MED ORDER — IPRATROPIUM BROMIDE HFA 17 MCG/ACT IN AERS: 2.0000 | INHALATION_SPRAY | RESPIRATORY_TRACT | Status: DC

## 2013-06-25 MED ORDER — ALBUTEROL SULFATE HFA 108 (90 BASE) MCG/ACT IN AERS: 2.0000 | INHALATION_SPRAY | Freq: Four times a day (QID) | RESPIRATORY_TRACT | Status: DC | PRN

## 2013-07-23 ENCOUNTER — Other Ambulatory Visit: Payer: Self-pay | Admitting: Cardiovascular Disease

## 2013-07-24 MED ORDER — TRAMADOL HCL 50 MG PO TABS: ORAL_TABLET | ORAL | Status: DC

## 2013-07-24 NOTE — Addendum Note (Signed)
Addended by: Lindell Spar on: 07/24/2013 07:23 AM   Modules accepted: Orders

## 2013-07-24 NOTE — Telephone Encounter (Signed)
Refill of tramadol authorized by Dr. Royann Shivers. Will be faxed/called in to pharmacy

## 2013-07-25 ENCOUNTER — Ambulatory Visit: Payer: Medicare Other | Admitting: Pharmacist Clinician (PhC)/ Clinical Pharmacy Specialist

## 2013-07-30 ENCOUNTER — Ambulatory Visit: Payer: Medicare Other | Admitting: Pharmacist Clinician (PhC)/ Clinical Pharmacy Specialist

## 2013-08-06 ENCOUNTER — Ambulatory Visit (INDEPENDENT_AMBULATORY_CARE_PROVIDER_SITE_OTHER): Payer: Medicare Other | Admitting: Pharmacist Clinician (PhC)/ Clinical Pharmacy Specialist

## 2013-08-06 VITALS — BP 128/68 | HR 72

## 2013-08-06 DIAGNOSIS — I219 Acute myocardial infarction, unspecified: Secondary | ICD-10-CM | POA: Diagnosis not present

## 2013-08-06 DIAGNOSIS — I236 Thrombosis of atrium, auricular appendage, and ventricle as current complications following acute myocardial infarction: Secondary | ICD-10-CM

## 2013-08-06 DIAGNOSIS — Z7901 Long term (current) use of anticoagulants: Secondary | ICD-10-CM

## 2013-08-06 DIAGNOSIS — I238 Other current complications following acute myocardial infarction: Secondary | ICD-10-CM | POA: Diagnosis not present

## 2013-08-06 LAB — POCT INR: INR: 3.1

## 2013-08-19 ENCOUNTER — Other Ambulatory Visit: Payer: Self-pay | Admitting: Cardiovascular Disease

## 2013-08-21 ENCOUNTER — Other Ambulatory Visit: Payer: Self-pay | Admitting: Cardiovascular Disease

## 2013-08-22 NOTE — Telephone Encounter (Signed)
Per Dr Royann Shivers from 08/19/2013, Zolpidem approved. Called into pharmacy.

## 2013-09-17 ENCOUNTER — Ambulatory Visit: Payer: Medicare Other | Admitting: Pharmacist Clinician (PhC)/ Clinical Pharmacy Specialist

## 2013-09-19 ENCOUNTER — Other Ambulatory Visit: Payer: Self-pay | Admitting: Cardiovascular Disease

## 2013-09-22 ENCOUNTER — Other Ambulatory Visit: Payer: Self-pay | Admitting: Pharmacist Clinician (PhC)/ Clinical Pharmacy Specialist

## 2013-09-22 MED ORDER — WARFARIN SODIUM 5 MG PO TABS: ORAL_TABLET | ORAL | Status: DC

## 2013-09-23 ENCOUNTER — Other Ambulatory Visit: Payer: Self-pay | Admitting: *Deleted

## 2013-09-23 MED ORDER — TRAMADOL HCL 50 MG PO TABS: ORAL_TABLET | ORAL | Status: DC

## 2013-09-23 NOTE — Telephone Encounter (Signed)
Pt's son called and asked about [name]'s Tramadol being refilled. He was going to the pharmacy to pick it up and he wanted to know if it was called in as well with the other medication or if he needed to come in to see him to get another Rx.   MC

## 2013-09-23 NOTE — Telephone Encounter (Signed)
Refill on Tramadol 50mg  q6h prn #60 w/0 refills per Dr. Salena Saner.  Will get signed in AM and notify patient to pick up.

## 2013-09-26 ENCOUNTER — Ambulatory Visit: Payer: Medicare Other | Admitting: Pharmacist Clinician (PhC)/ Clinical Pharmacy Specialist

## 2013-10-22 ENCOUNTER — Other Ambulatory Visit: Payer: Self-pay | Admitting: Pharmacist Clinician (PhC)/ Clinical Pharmacy Specialist

## 2013-10-22 ENCOUNTER — Other Ambulatory Visit: Payer: Self-pay | Admitting: Cardiovascular Disease

## 2013-10-23 NOTE — Telephone Encounter (Signed)
This one goes to MD for approval, not me.  Charles Hatfield

## 2013-10-28 MED ORDER — ZOLPIDEM TARTRATE ER 6.25 MG PO TBCR: EXTENDED_RELEASE_TABLET | ORAL | Status: DC

## 2013-10-28 NOTE — Addendum Note (Signed)
Addended by: Neta Ehlers on: 10/28/2013 05:30 PM   Modules accepted: Orders

## 2013-10-28 NOTE — Telephone Encounter (Signed)
Refill called in to pharmacy

## 2013-10-29 ENCOUNTER — Ambulatory Visit: Payer: Medicare Other | Admitting: Pharmacist Clinician (PhC)/ Clinical Pharmacy Specialist

## 2013-10-31 IMAGING — CT CT ABD-PELV W/O CM
2 of 4 series · 13 of 32 positions shown, 18 images · non-contrast
Comparison: Abdominal CT 02/24/2011.

CLINICAL DATA: Left flank pain and gross hematuria.

CT ABDOMEN AND PELVIS WITHOUT CONTRAST
TECHNIQUE: Multidetector CT imaging of the abdomen and pelvis was
performed following the standard protocol without intravenous
contrast.

[Series 2: — · axial · 0.70mm/px · z∈[-416,-86]mm · 7 of 89 slices shown, 12 images]
[im 12/89  soft-tissue]
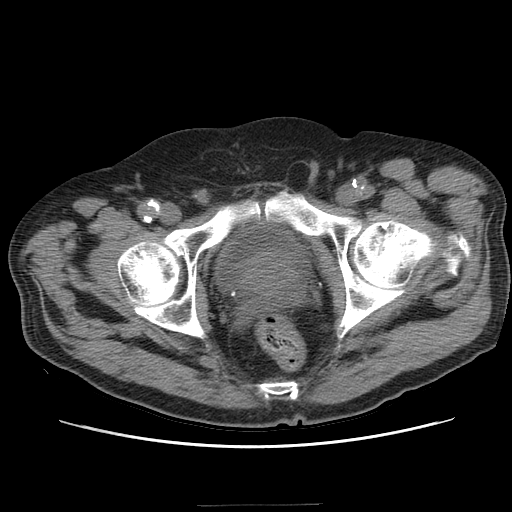
[im 12/89  bone]
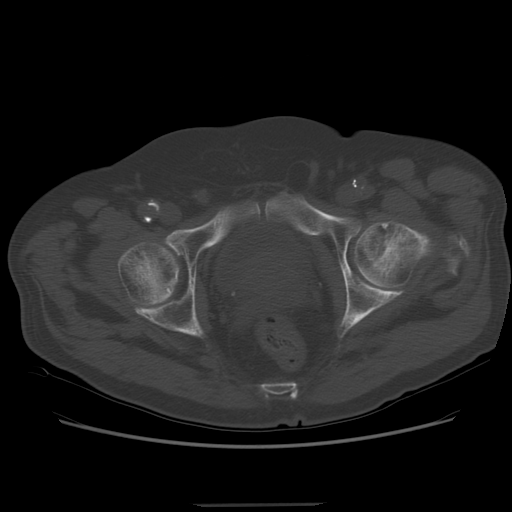
[im 23/89  soft-tissue]
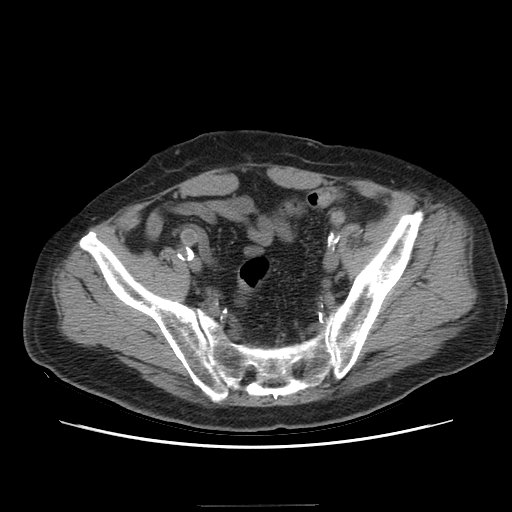
[im 34/89  soft-tissue]
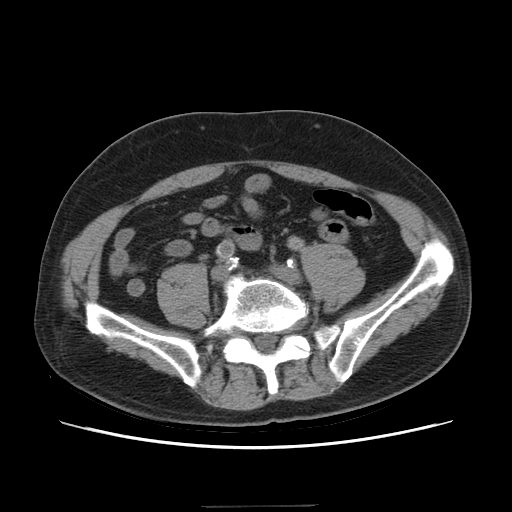
[im 45/89  soft-tissue]
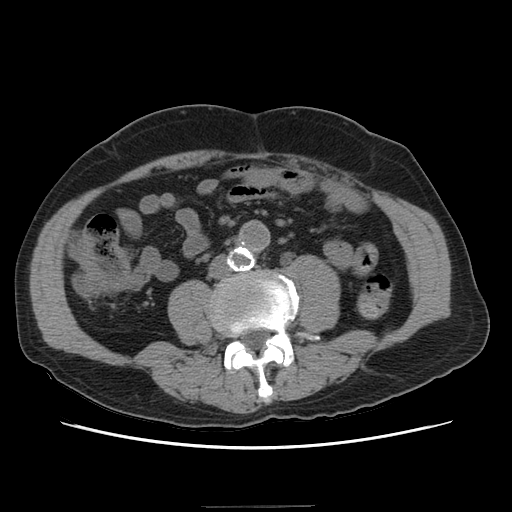
[im 45/89  lung]
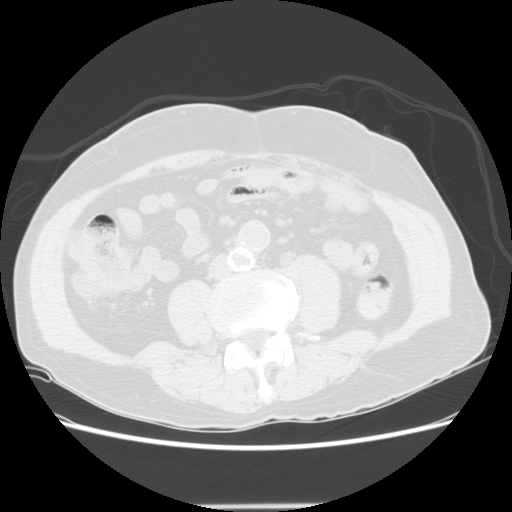
[im 56/89  soft-tissue]
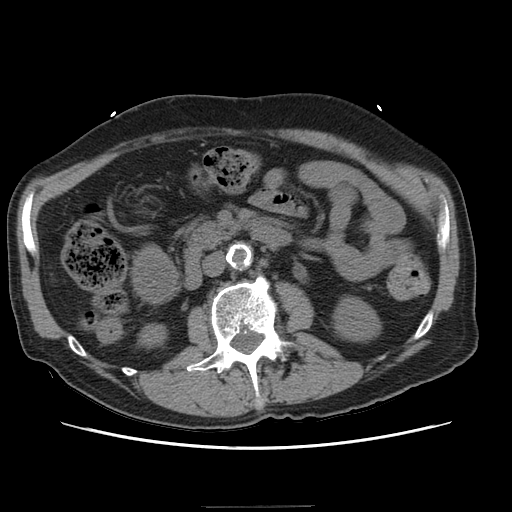
[im 56/89  lung]
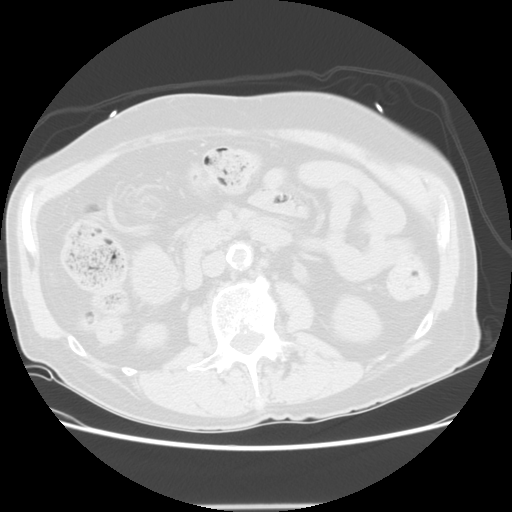
[im 67/89  soft-tissue]
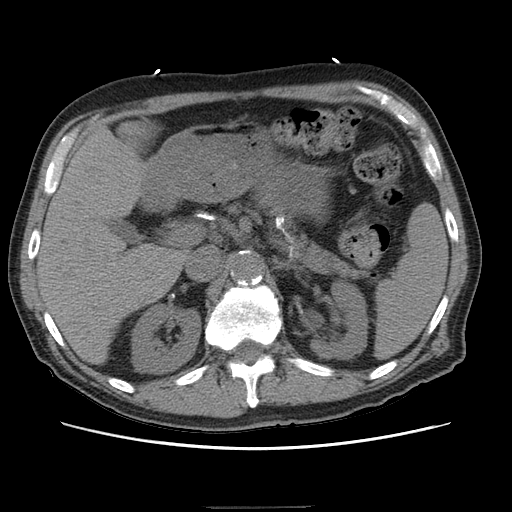
[im 67/89  lung]
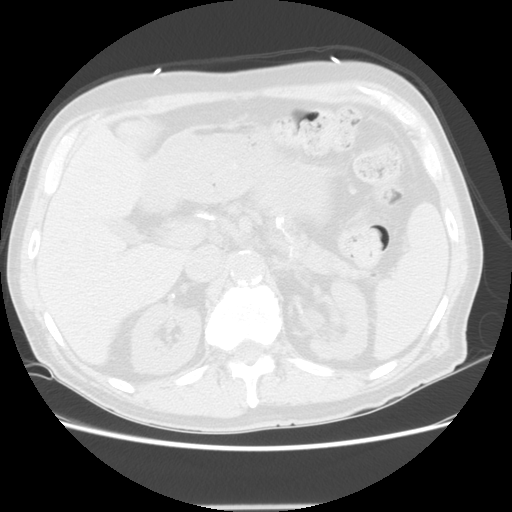
[im 78/89  soft-tissue]
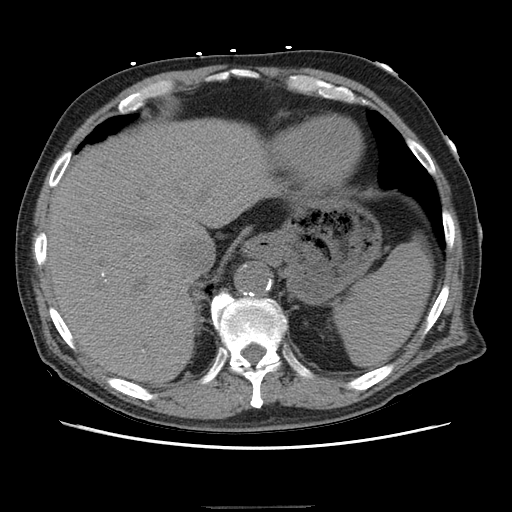
[im 78/89  lung]
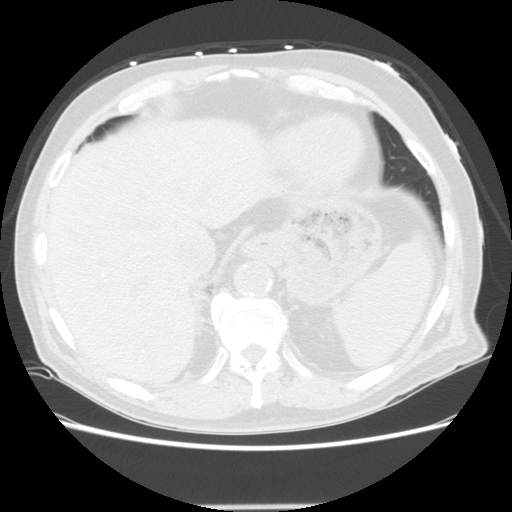

[Series 400: sagittal · sagittal · 0.92mm/px · 6 of 99 slices shown]
[im 11/99  soft-tissue]
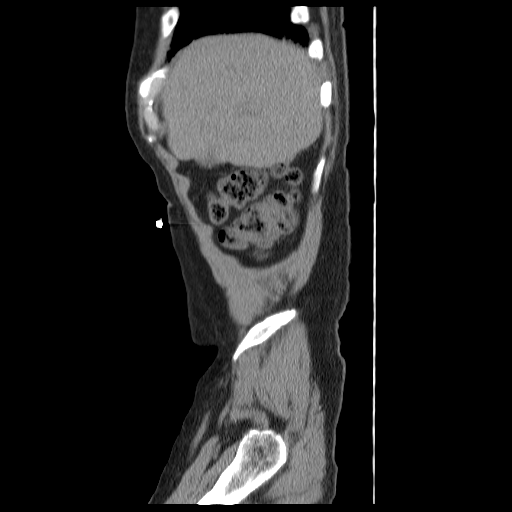
[im 22/99  soft-tissue]
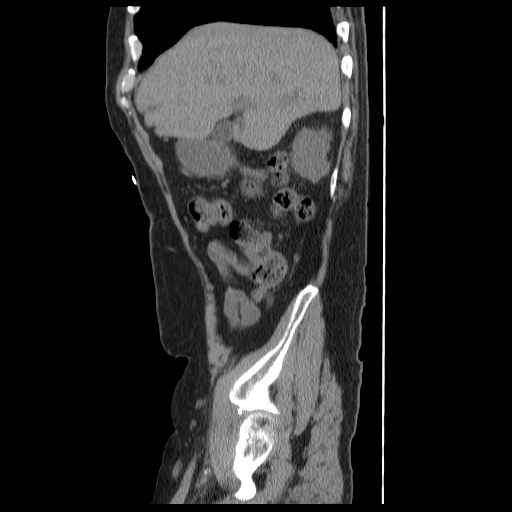
[im 33/99  soft-tissue]
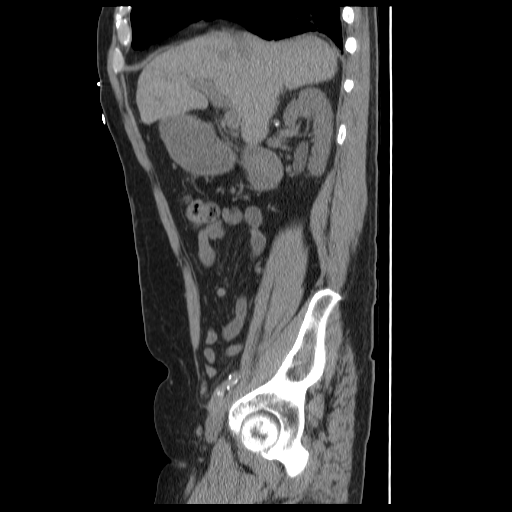
[im 44/99  soft-tissue]
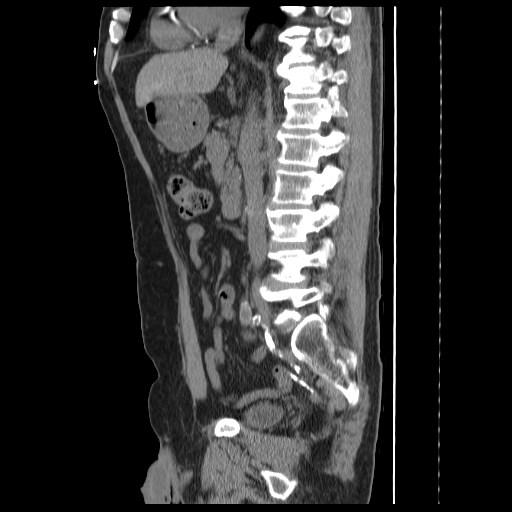
[im 55/99  soft-tissue]
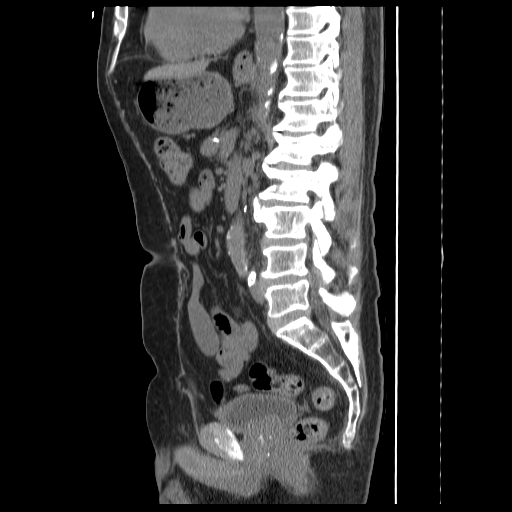
[im 66/99  soft-tissue]
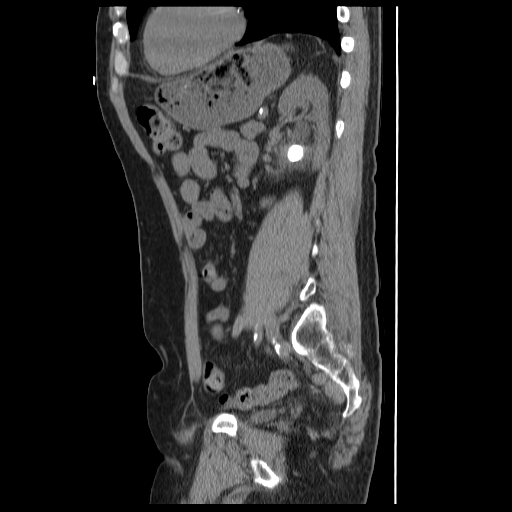

[13 of 32 positions shown; findings below may reference images not displayed]

FINDINGS: There is mild atelectasis at both lung bases.  Mild
distal esophageal wall thickening appears improved.  There is no
significant pleural effusion.

Once again demonstrated is a large staghorn calculus within the
left renal pelvis.  This measures up to 2.8 cm in diameter and is
similar to the prior study.  This is likely intermittently
obstructing the ureteral pelvic junction.  There is mild
caliectasis and perinephric soft tissue stranding.  The left ureter
is mildly dilated.  No ureteral calculi are seen.  There is a 4 mm
calculus in a left lower pole calix. Renovascular calcifications
are present bilaterally.  There is stable scarring in the lower
pole of the right kidney.

There are scattered granulomas in the liver and pancreas.  The
spleen, gallbladder and adrenal glands appear unremarkable as
imaged in the noncontrast state.

A moderate amount of ingested material is present within the
stomach.  There is moderate stool throughout the colon.  No bowel
wall thickening or surrounding inflammatory change is seen.  The
appendix appears normal.  There is diffuse atherosclerosis status
post aortoiliac bypass grafting.

The bladder has a stable appearance.  There are stable
calcifications within the prostate gland.  There are stable pelvic
phleboliths.  There is trace free pelvic fluid without focal fluid
collection or inflammatory process.  Small midline suprapubic and
left inguinal hernias containing only fat are unchanged.  There are
no acute or suspicious osseous findings.
IMPRESSION: 1.  Large staghorn left renal pelvic calculus with probable chronic
intermittent obstruction of the UPJ.  This is similar to prior CT
2.  No evidence of ureteral or bladder calculus.
3.  Similar appearance of the abdominal aorta status post
aortoiliac bypass grafting. Current study was performed without
contrast and does not address graft patency.  The left iliac limb
was shown to be occluded previously.

## 2013-11-14 ENCOUNTER — Telehealth: Payer: Self-pay | Admitting: Cardiovascular Disease

## 2013-11-14 ENCOUNTER — Ambulatory Visit: Payer: Medicare Other | Admitting: Pharmacist Clinician (PhC)/ Clinical Pharmacy Specialist

## 2013-11-14 NOTE — Telephone Encounter (Signed)
Returned a call to patient's son. He informs me the patient is experiencing symptoms of nausea, burping and diarrhea. Patient also complains of having side pain. Denies having chest pain or fever. Skin may be "a little clammy."  The son has given the patient some pepto bismol and this has seem to lessen the symptoms. Per son patient does not want to come to [facility] or go to the ED. Informed the son that we cannot diagnose the patient over the telephone. It is my recommendation that he gets a medical evaluation for his symptoms. The son voices his understanding and replied that he will wait a little while longer before he takes him for evaluation. He is going to see how and if his symptoms progress. He states that there is a urgent care near by that is open at least til 5.  I reminded him this is a holiday weekend and I would not recommend that he wait too long. It may be harder to find a provider other than the ED since the patient is adamant about not going to the ED. Dr. Royann Shivers  Was informed of patient's symptoms and agreed with stated advice.

## 2013-11-14 NOTE — Telephone Encounter (Signed)
His father is not feeling well , has nausea and diarrhea . Still complaining of pain in his left side . Has been given Pepto Bismol...would like to know what should he do if need to .Marland Kitchen Please Call

## 2013-11-21 ENCOUNTER — Ambulatory Visit: Payer: Medicare Other | Admitting: Pharmacist Clinician (PhC)/ Clinical Pharmacy Specialist

## 2013-11-24 ENCOUNTER — Ambulatory Visit: Payer: Medicare Other | Admitting: Pharmacist Clinician (PhC)/ Clinical Pharmacy Specialist

## 2013-11-24 ENCOUNTER — Ambulatory Visit (INDEPENDENT_AMBULATORY_CARE_PROVIDER_SITE_OTHER): Payer: Medicare Other | Admitting: Pharmacist Clinician (PhC)/ Clinical Pharmacy Specialist

## 2013-11-24 DIAGNOSIS — I219 Acute myocardial infarction, unspecified: Secondary | ICD-10-CM | POA: Diagnosis not present

## 2013-11-24 DIAGNOSIS — Z7901 Long term (current) use of anticoagulants: Secondary | ICD-10-CM

## 2013-11-24 DIAGNOSIS — I238 Other current complications following acute myocardial infarction: Secondary | ICD-10-CM | POA: Diagnosis not present

## 2013-11-24 DIAGNOSIS — I236 Thrombosis of atrium, auricular appendage, and ventricle as current complications following acute myocardial infarction: Secondary | ICD-10-CM

## 2013-11-24 LAB — POCT INR: INR: 3

## 2013-12-02 ENCOUNTER — Other Ambulatory Visit: Payer: Self-pay | Admitting: Cardiovascular Disease

## 2013-12-02 ENCOUNTER — Other Ambulatory Visit: Payer: Self-pay | Admitting: Pharmacist Clinician (PhC)/ Clinical Pharmacy Specialist

## 2013-12-06 ENCOUNTER — Telehealth: Payer: Self-pay | Admitting: Pharmacist Clinician (PhC)/ Clinical Pharmacy Specialist

## 2013-12-06 ENCOUNTER — Encounter: Payer: Self-pay | Admitting: Pharmacist Clinician (PhC)/ Clinical Pharmacy Specialist

## 2013-12-08 NOTE — Telephone Encounter (Signed)
Closed enocunter °

## 2014-01-05 ENCOUNTER — Ambulatory Visit (INDEPENDENT_AMBULATORY_CARE_PROVIDER_SITE_OTHER): Payer: Medicare Other | Admitting: Pharmacist

## 2014-01-05 DIAGNOSIS — I2589 Other forms of chronic ischemic heart disease: Secondary | ICD-10-CM

## 2014-01-05 DIAGNOSIS — I238 Other current complications following acute myocardial infarction: Secondary | ICD-10-CM | POA: Diagnosis not present

## 2014-01-05 DIAGNOSIS — Z7901 Long term (current) use of anticoagulants: Secondary | ICD-10-CM | POA: Diagnosis not present

## 2014-01-05 DIAGNOSIS — I236 Thrombosis of atrium, auricular appendage, and ventricle as current complications following acute myocardial infarction: Secondary | ICD-10-CM

## 2014-01-05 LAB — POCT INR: INR: 3.4

## 2014-01-16 ENCOUNTER — Other Ambulatory Visit: Payer: Self-pay | Admitting: Cardiovascular Disease

## 2014-01-16 NOTE — Telephone Encounter (Signed)
Rx was sent to pharmacy electronically per Dr Royann Shivers.

## 2014-02-02 ENCOUNTER — Ambulatory Visit: Payer: Medicare Other | Admitting: Pharmacist Clinician (PhC)/ Clinical Pharmacy Specialist

## 2014-02-08 ENCOUNTER — Other Ambulatory Visit: Payer: Self-pay | Admitting: Pharmacist Clinician (PhC)/ Clinical Pharmacy Specialist

## 2014-02-13 ENCOUNTER — Ambulatory Visit: Payer: Medicare Other | Admitting: Pharmacist Clinician (PhC)/ Clinical Pharmacy Specialist

## 2014-02-25 ENCOUNTER — Ambulatory Visit: Payer: Medicare Other | Admitting: Pharmacist Clinician (PhC)/ Clinical Pharmacy Specialist

## 2014-03-05 ENCOUNTER — Other Ambulatory Visit: Payer: Self-pay | Admitting: Cardiovascular Disease

## 2014-03-05 MED ORDER — TRAMADOL HCL 50 MG PO TABS: 50.0000 mg | ORAL_TABLET | Freq: Four times a day (QID) | ORAL | Status: DC | PRN

## 2014-03-05 NOTE — Telephone Encounter (Signed)
Rx refill approved by Dr.C and called into pharmacy

## 2014-03-06 ENCOUNTER — Ambulatory Visit: Payer: Medicare Other | Admitting: Pharmacist Clinician (PhC)/ Clinical Pharmacy Specialist

## 2014-03-09 ENCOUNTER — Other Ambulatory Visit: Payer: Self-pay | Admitting: Cardiovascular Disease

## 2014-03-09 ENCOUNTER — Ambulatory Visit: Payer: Medicare Other | Admitting: Pharmacist Clinician (PhC)/ Clinical Pharmacy Specialist

## 2014-03-13 ENCOUNTER — Ambulatory Visit: Payer: Medicare Other | Admitting: Pharmacist Clinician (PhC)/ Clinical Pharmacy Specialist

## 2014-03-25 ENCOUNTER — Ambulatory Visit (INDEPENDENT_AMBULATORY_CARE_PROVIDER_SITE_OTHER): Payer: Medicare Other | Admitting: Pharmacist Clinician (PhC)/ Clinical Pharmacy Specialist

## 2014-03-25 DIAGNOSIS — Z7901 Long term (current) use of anticoagulants: Secondary | ICD-10-CM

## 2014-03-25 DIAGNOSIS — I238 Other current complications following acute myocardial infarction: Secondary | ICD-10-CM

## 2014-03-25 DIAGNOSIS — I2589 Other forms of chronic ischemic heart disease: Secondary | ICD-10-CM | POA: Diagnosis not present

## 2014-03-25 DIAGNOSIS — I236 Thrombosis of atrium, auricular appendage, and ventricle as current complications following acute myocardial infarction: Secondary | ICD-10-CM

## 2014-03-25 LAB — POCT INR: INR: 3.1

## 2014-04-08 ENCOUNTER — Other Ambulatory Visit: Payer: Self-pay | Admitting: Cardiovascular Disease

## 2014-04-09 ENCOUNTER — Telehealth: Payer: Self-pay | Admitting: *Deleted

## 2014-04-09 NOTE — Telephone Encounter (Signed)
LM to pick up Rx for Tramadol at the front desk.

## 2014-04-17 ENCOUNTER — Other Ambulatory Visit: Payer: Self-pay | Admitting: Cardiovascular Disease

## 2014-04-19 NOTE — Telephone Encounter (Signed)
Rx was sent to pharmacy electronically. 

## 2014-04-23 ENCOUNTER — Other Ambulatory Visit: Payer: Self-pay | Admitting: Pharmacist Clinician (PhC)/ Clinical Pharmacy Specialist

## 2014-05-06 ENCOUNTER — Ambulatory Visit: Payer: Medicare Other | Admitting: Pharmacist Clinician (PhC)/ Clinical Pharmacy Specialist

## 2014-05-15 ENCOUNTER — Ambulatory Visit: Payer: Medicare Other | Admitting: Pharmacist Clinician (PhC)/ Clinical Pharmacy Specialist

## 2014-05-18 ENCOUNTER — Other Ambulatory Visit: Payer: Self-pay | Admitting: Cardiovascular Disease

## 2014-05-18 MED ORDER — TRAMADOL HCL 50 MG PO TABS: 50.0000 mg | ORAL_TABLET | Freq: Four times a day (QID) | ORAL | Status: DC | PRN

## 2014-05-18 NOTE — Addendum Note (Signed)
Addended by: Neta Ehlers on: 05/18/2014 05:18 PM   Modules accepted: Orders

## 2014-05-25 ENCOUNTER — Ambulatory Visit (INDEPENDENT_AMBULATORY_CARE_PROVIDER_SITE_OTHER): Payer: Medicare Other | Admitting: Pharmacist Clinician (PhC)/ Clinical Pharmacy Specialist

## 2014-05-25 DIAGNOSIS — Z7901 Long term (current) use of anticoagulants: Secondary | ICD-10-CM

## 2014-05-25 DIAGNOSIS — I213 ST elevation (STEMI) myocardial infarction of unspecified site: Secondary | ICD-10-CM | POA: Diagnosis not present

## 2014-05-25 DIAGNOSIS — I236 Thrombosis of atrium, auricular appendage, and ventricle as current complications following acute myocardial infarction: Secondary | ICD-10-CM

## 2014-05-25 DIAGNOSIS — I513 Intracardiac thrombosis, not elsewhere classified: Secondary | ICD-10-CM | POA: Diagnosis not present

## 2014-05-25 LAB — POCT INR: INR: 2.9

## 2014-05-29 ENCOUNTER — Telehealth: Payer: Self-pay

## 2014-05-29 NOTE — Telephone Encounter (Signed)
Notified patient's son, Casimiro Needle, that his prescription for tramadol would be up at the front desk for him to pick up.

## 2014-06-03 ENCOUNTER — Encounter: Payer: Self-pay | Admitting: Cardiovascular Disease

## 2014-06-03 ENCOUNTER — Ambulatory Visit (INDEPENDENT_AMBULATORY_CARE_PROVIDER_SITE_OTHER): Payer: Medicare Other | Admitting: Cardiovascular Disease

## 2014-06-03 ENCOUNTER — Ambulatory Visit (INDEPENDENT_AMBULATORY_CARE_PROVIDER_SITE_OTHER): Payer: Medicare Other | Admitting: Pharmacist Clinician (PhC)/ Clinical Pharmacy Specialist

## 2014-06-03 VITALS — BP 110/62 | HR 79 | Resp 16 | Ht 66.0 in | Wt 154.4 lb

## 2014-06-03 DIAGNOSIS — E785 Hyperlipidemia, unspecified: Secondary | ICD-10-CM

## 2014-06-03 DIAGNOSIS — Z7901 Long term (current) use of anticoagulants: Secondary | ICD-10-CM

## 2014-06-03 DIAGNOSIS — I513 Intracardiac thrombosis, not elsewhere classified: Secondary | ICD-10-CM

## 2014-06-03 DIAGNOSIS — Z8673 Personal history of transient ischemic attack (TIA), and cerebral infarction without residual deficits: Secondary | ICD-10-CM

## 2014-06-03 DIAGNOSIS — I2510 Atherosclerotic heart disease of native coronary artery without angina pectoris: Secondary | ICD-10-CM

## 2014-06-03 DIAGNOSIS — I236 Thrombosis of atrium, auricular appendage, and ventricle as current complications following acute myocardial infarction: Secondary | ICD-10-CM

## 2014-06-03 DIAGNOSIS — I10 Essential (primary) hypertension: Secondary | ICD-10-CM

## 2014-06-03 DIAGNOSIS — I213 ST elevation (STEMI) myocardial infarction of unspecified site: Secondary | ICD-10-CM

## 2014-06-03 DIAGNOSIS — Z720 Tobacco use: Secondary | ICD-10-CM

## 2014-06-03 DIAGNOSIS — N183 Chronic kidney disease, stage 3 unspecified: Secondary | ICD-10-CM

## 2014-06-03 DIAGNOSIS — Z9889 Other specified postprocedural states: Secondary | ICD-10-CM | POA: Diagnosis not present

## 2014-06-03 DIAGNOSIS — Z951 Presence of aortocoronary bypass graft: Secondary | ICD-10-CM

## 2014-06-03 DIAGNOSIS — I739 Peripheral vascular disease, unspecified: Secondary | ICD-10-CM

## 2014-06-03 DIAGNOSIS — I779 Disorder of arteries and arterioles, unspecified: Secondary | ICD-10-CM

## 2014-06-03 DIAGNOSIS — N200 Calculus of kidney: Secondary | ICD-10-CM

## 2014-06-03 DIAGNOSIS — J410 Simple chronic bronchitis: Secondary | ICD-10-CM | POA: Diagnosis not present

## 2014-06-03 DIAGNOSIS — R1310 Dysphagia, unspecified: Secondary | ICD-10-CM

## 2014-06-03 DIAGNOSIS — Z79899 Other long term (current) drug therapy: Secondary | ICD-10-CM

## 2014-06-03 LAB — POCT INR: INR: 4.3

## 2014-06-03 NOTE — Patient Instructions (Signed)
Dr Royann Shivers has ordered the following test(s) to be done: 1. Blood work 2. Urinalysis 3. Carotid doppler - This test is an ultrasound of the carotid arteries in your neck. It looks at blood flow through these arteries that supply the brain with blood. Allow one hour for this exam. There are no restrictions or special instructions.  Dr Royann Shivers wants you to follow-up in 1 year. You will receive a reminder letter in the mail one months in advance. If you don't receive a letter, please call our office to schedule the follow-up appointment.  Dr Royann Shivers has referred you to a speech pathologist.

## 2014-06-03 NOTE — Progress Notes (Signed)
Patient ID: Charles Hatfield, male   DOB: [birth year], 73 y.o.   MRN: [id number]     Reason for office visit CAD s/p CABG, carotid stenosis, PAD  Charles Hatfield is 2 years s/p coronary bypass surgery and left carotid endarterectomy. He has mild ischemic cardiomyopathy with a left ventricular ejection fraction of 40-45%(inferior scar, apical septal dyskinesia with associated thrombus). He has done quite well. He is free of angina. He has a chronic cough and mild exertional dyspnea and COPD. He has not had worsening shortness of breath, edema or dizziness. He has successfully quit smoking, but is using a vape device.Marland Kitchen His major complaint is persistent mild left flank discomfort that is not related to meals or activity. He is known to have a staghorn calculus in the left renal pelvis. They have not noticed gross hematuria and he has not had fever. He denies interval focal neurological deficits, but his son has noticed that he chokes more often. He has mild residual facial asymmetry and has had the dysphagia following his stroke. Has a remote history of aortoiliac bypass surgery. Arterial Dopplers performed in our office 12/26/12 revealed a right ABI of 0.59, left ABI 0.48 with a high-frequency signal in his left common iliac artery. The patient does have pain in his left foot. Peripheral angiography on 03/02/13 revealing an occluded left limb of his aortobifemoral bypass graft, an occluded right SFA, 80% segmental calcified mid left SFA with three-vessel runoff. Percutaneous revascularization was not possible and since he did not appear to have limb threatening ischemia, so he was not referred for redo surgery. He does not have any active foot ulcers.  No Known Allergies  Current Outpatient Prescriptions  Medication Sig Dispense Refill  . acetaminophen (TYLENOL) 500 MG tablet Take 500 mg by mouth every 6 (six) hours as needed for pain.    Marland Kitchen albuterol (PROVENTIL HFA;VENTOLIN HFA) 108 (90 BASE) MCG/ACT inhaler  Inhale 2 puffs into the lungs every 6 (six) hours as needed for wheezing. 1 Inhaler 6  . aspirin EC 81 MG tablet Take 81 mg by mouth daily.    Marland Kitchen atorvastatin (LIPITOR) 20 MG tablet TAKE ONE TABLET BY MOUTH ONCE DAILY 30 tablet 2  . bisacodyl (BISACODYL) 5 MG EC tablet Take 5 mg by mouth daily as needed for constipation.    Marland Kitchen ipratropium (ATROVENT HFA) 17 MCG/ACT inhaler Inhale 2 puffs into the lungs every 3 (three) days. 1 Inhaler 6  . lisinopril (PRINIVIL,ZESTRIL) 5 MG tablet Take 1 tablet (5 mg total) by mouth daily. 30 tablet 10  . Naftifine HCl (NAFTIN) 2 % GEL Apply 1 application topically daily as needed.    . nitroGLYCERIN (NITROSTAT) 0.4 MG SL tablet Place 0.4 mg under the tongue every 5 (five) minutes as needed for chest pain.    . traMADol (ULTRAM) 50 MG tablet Take 1 tablet (50 mg total) by mouth every 6 (six) hours as needed. for pain 60 tablet 0  . warfarin (COUMADIN) 5 MG tablet Take 1 tablet by mouth daily as directed by coumadin clinic 30 tablet 0   No current facility-administered medications for this visit.    Past Medical History  Diagnosis Date  . Hypertension   . Diabetes mellitus   . H/O ascending aorta repair   . Stroke   . Hematuria 01/15/2012  . Staghorn renal calculus 01/15/2012  . Left ventricular apical thrombus following MI, NSTEMI, 01/13/12 01/15/2012  . DM (diabetes mellitus) 01/15/2012  . Thrombocytopenia   . COPD (chronic obstructive  pulmonary disease)   . CAD (coronary artery disease)   . S/P CABG x 3 01/22/2012    LIMA to LAD,SVG to second diagonal,SVG to distal RCA  . Dyslipidemia   . Ischemic cardiomyopathy   . LV (left ventricular) mural thrombus     inferapical  . Atrial fibrillation     perioperative  . Carotid stenosis     bilateral  . Critical lower limb ischemia     Past Surgical History  Procedure Laterality Date  . Repair thoracic aorta      Ascending thoracic aorto repair  . Coronary artery bypass graft  01/22/2012    Procedure:  CORONARY ARTERY BYPASS GRAFTING (CABG);  Surgeon: Kerin Perna, MD;  Location: [facility] OR;  Service: Open Heart Surgery;  Laterality: N/A;  CABG x three using left internal mammary artery and right leg greater saphenous vein harvested endoscopically  . Endarterectomy  01/22/2012    Procedure: ENDARTERECTOMY CAROTID;  Surgeon: Larina Earthly, MD;  Location[facility] OR;  Service: Vascular;  Laterality: Left;  . Carotid endarterectomy  01/13/2012  . Nm myoview ltd  02/20/2012    infarct/scar w/mild perinfarct ischemia, EF 38%, abnormal nuc, no sign. ischemia    Family History  Problem Relation Age of Onset  . Heart disease Father   . Heart attack Brother     History   Social History  . Marital Status: Widowed    Spouse Name: N/A    Number of Children: N/A  . Years of Education: N/A   Occupational History  . Not on file.   Social History Main Topics  . Smoking status: Former Smoker -- 1.00 packs/day    Types: Cigarettes    Quit date: 01/13/2012  . Smokeless tobacco: Never Used  . Alcohol Use: No  . Drug Use: No  . Sexual Activity: No   Other Topics Concern  . Not on file   Social History Narrative    Review of systems: The patient specifically denies any chest pain at rest or with exertion, dyspnea at rest, orthopnea, paroxysmal nocturnal dyspnea, syncope, palpitations, focal neurological deficits, intermittent claudication, lower extremity edema, unexplained weight gain, cough, hemoptysis or wheezing.  The patient also denies abdominal pain, nausea, vomiting, diarrhea, constipation, polyuria, polydipsia, dysuria, hematuria, frequency, urgency, abnormal bleeding or bruising, fever, chills, unexpected weight changes, mood swings, change in skin or hair texture, change in voice quality, auditory or visual problems, allergic reactions or rashes, new musculoskeletal complaints other than usual "aches and pains".   PHYSICAL EXAM BP 110/62 mmHg  Pulse 79  Resp 16  Ht 5\' 6"  (1.676 m)  Wt  154 lb 6.4 oz (70.035 kg)  BMI 24.93 kg/m2 General: Alert, oriented x3, no distress  Head: no evidence of trauma, PERRL, EOMI, no exophtalmos or lid lag, no myxedema, no xanthelasma; normal ears, nose and oropharynx  Neck: normal jugular venous pulsations and no hepatojugular reflux; brisk carotid pulses without delay and loud right carotid bruit; healed left carotid endarterectomy scar  Chest: clear to auscultation, but generally reduced and distant breath sounds, no signs of consolidation by percussion or palpation, normal fremitus, symmetrical and full respiratory excursions; healed sternotomy scar  Cardiovascular: normal position and quality of the apical impulse, regular rhythm, normal first and second heart sounds, no murmurs, rubs or gallops  Abdomen: no tenderness or distention, no masses by palpation, no abnormal pulsatility or arterial bruits, normal bowel sounds, no hepatosplenomegaly  Extremities: no clubbing, cyanosis or edema; 2+ radial, ulnar and brachial  pulses bilaterally; 2+ right femoral, thready posterior tibial and dorsalis pedis pulses; 2+ left femoral, thready posterior tibial and dorsalis pedis pulses; no subclavian; bilateral femoral bruits are present 1-2 mm scab on left great toe, no surrounding redness or warmth Neurological: grossly nonfocal   EKG: Normal sinus rhythm, Q waves in the inferior and anterolateral leads, no acute repolarization abnormalities  Lipid Panel     Component Value Date/Time   CHOL 141 01/14/2012 0600   TRIG 246* 01/14/2012 0600   HDL 30* 01/14/2012 0600   CHOLHDL 4.7 01/14/2012 0600   VLDL 49* 01/14/2012 0600   LDLCALC 62 01/14/2012 0600    BMET    Component Value Date/Time   NA 138 03/04/2013 0635   K 3.9 03/04/2013 0635   CL 105 03/04/2013 0635   CO2 21 03/04/2013 0635   GLUCOSE 118* 03/04/2013 0635   BUN 13 03/04/2013 0635   CREATININE 1.14 03/04/2013 0635   CALCIUM 9.1 03/04/2013 0635   GFRNONAA 63* 03/04/2013 0635    GFRAA 73* 03/04/2013 0635     ASSESSMENT AND PLAN CAD (coronary artery disease) status post CABG July 2013 Currently free of angina  Left ventricular apical thrombus following MI, NSTEMI, 01/13/12 On chronic warfarin. He has had a previous stroke although this was just is likely related to carotid disease as to possible embolism.  Ischemic cardiomyopathy, EF 40-45% 2D 01/15/12 No signs of heart failure at this time. He is on ACE inhibitors and beta blockers are being avoided because of COPD. He is euvolemic despite no loop diuretics. 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 No neurological complaints at this time. He has a loud distinct right carotid bruit that I do not recall hearing before. Schedule for repeat duplex ultrasonography  Critical lower limb ischemia  He has a very poor ABI in both feet, especially bad on the left but today there does not appear to be any signs of threatened limb loss. The only way to provide revascularization would be redo aortoiliac surgery or a femoral-femoral bypass, indicated only if he becomes more symptomatic.  Staghorn renal calculus This might be the cause of his left flank pain. Check UA.  HTN (hypertension) Good control. No changes were made to his medicines today  Mixed hyperlipidemia Time to repeat his assessment. LDL cholesterol is in the desirable range but his triglycerides were high at his last assay. Restrict carbohydrates and simple sugars and starches with low glycemic index  Dysphagia/choking Further speech pathology for repeat assessment of his swallowing. I don't think he is had any stroke. I think he may need reinforcement and safe eating and swallowing technique since he has some residual weakness of his pharyngeal mechanism.  Orders Placed This Encounter    Procedures  . EKG 12-Lead   No orders of the defined types were placed in this encounter.    Junious SilkROITORU,Corwyn Vora  Sharonne Ricketts, MD, [facility]fordFACC [facility] [phone] office [phone] pager

## 2014-06-04 ENCOUNTER — Encounter (HOSPITAL_COMMUNITY): Payer: Self-pay | Admitting: Cardiovascular Disease

## 2014-06-08 ENCOUNTER — Other Ambulatory Visit: Payer: Self-pay | Admitting: *Deleted

## 2014-06-08 ENCOUNTER — Telehealth: Payer: Self-pay | Admitting: *Deleted

## 2014-06-08 DIAGNOSIS — T17308A Unspecified foreign body in larynx causing other injury, initial encounter: Secondary | ICD-10-CM

## 2014-06-08 DIAGNOSIS — R1310 Dysphagia, unspecified: Secondary | ICD-10-CM

## 2014-06-08 NOTE — Telephone Encounter (Signed)
Order placed for modified barium swallow which is required prior to speech therapy starting.

## 2014-06-10 ENCOUNTER — Other Ambulatory Visit (HOSPITAL_COMMUNITY): Payer: Self-pay | Admitting: Cardiovascular Disease

## 2014-06-10 DIAGNOSIS — N200 Calculus of kidney: Secondary | ICD-10-CM | POA: Diagnosis not present

## 2014-06-10 DIAGNOSIS — Z79899 Other long term (current) drug therapy: Secondary | ICD-10-CM | POA: Diagnosis not present

## 2014-06-10 DIAGNOSIS — E785 Hyperlipidemia, unspecified: Secondary | ICD-10-CM | POA: Diagnosis not present

## 2014-06-10 DIAGNOSIS — R1310 Dysphagia, unspecified: Secondary | ICD-10-CM

## 2014-06-11 LAB — URINALYSIS
Glucose, UA: NEGATIVE mg/dL
NITRITE: NEGATIVE
PROTEIN: 100 mg/dL — AB
Specific Gravity, Urine: 1.021 (ref 1.005–1.030)
UROBILINOGEN UA: 1 mg/dL (ref 0.0–1.0)
pH: 6 (ref 5.0–8.0)

## 2014-06-11 LAB — COMPREHENSIVE METABOLIC PANEL
ALBUMIN: 4.3 g/dL (ref 3.5–5.2)
ALK PHOS: 94 U/L (ref 39–117)
ALT: 16 U/L (ref 0–53)
AST: 17 U/L (ref 0–37)
BUN: 15 mg/dL (ref 6–23)
CALCIUM: 9.4 mg/dL (ref 8.4–10.5)
CHLORIDE: 100 meq/L (ref 96–112)
CO2: 25 meq/L (ref 19–32)
Creat: 1.23 mg/dL (ref 0.50–1.35)
GLUCOSE: 128 mg/dL — AB (ref 70–99)
POTASSIUM: 4.5 meq/L (ref 3.5–5.3)
Sodium: 139 mEq/L (ref 135–145)
Total Bilirubin: 0.9 mg/dL (ref 0.2–1.2)
Total Protein: 6.9 g/dL (ref 6.0–8.3)

## 2014-06-11 LAB — LIPID PANEL
CHOLESTEROL: 108 mg/dL (ref 0–200)
HDL: 29 mg/dL — ABNORMAL LOW (ref >39)
LDL Cholesterol: 40 mg/dL (ref 0–99)
Total CHOL/HDL Ratio: 3.7 Ratio
Triglycerides: 194 mg/dL — ABNORMAL HIGH (ref <150)
VLDL: 39 mg/dL (ref 0–40)

## 2014-06-17 ENCOUNTER — Encounter (HOSPITAL_COMMUNITY): Payer: Medicare Other

## 2014-06-17 ENCOUNTER — Ambulatory Visit: Payer: Medicare Other | Admitting: Pharmacist Clinician (PhC)/ Clinical Pharmacy Specialist

## 2014-06-22 ENCOUNTER — Other Ambulatory Visit: Payer: Self-pay | Admitting: Cardiovascular Disease

## 2014-06-22 ENCOUNTER — Ambulatory Visit (HOSPITAL_COMMUNITY): Payer: Medicare Other

## 2014-06-22 NOTE — Telephone Encounter (Signed)
Rx refill sent to patient pharmacy   

## 2014-06-29 ENCOUNTER — Other Ambulatory Visit (HOSPITAL_COMMUNITY): Payer: Medicare Other

## 2014-06-29 ENCOUNTER — Ambulatory Visit (HOSPITAL_COMMUNITY): Admission: RE | Admit: 2014-06-29 | Payer: Medicare Other | Source: Ambulatory Visit

## 2014-07-01 ENCOUNTER — Ambulatory Visit: Payer: Medicare Other | Admitting: Pharmacist Clinician (PhC)/ Clinical Pharmacy Specialist

## 2014-07-01 ENCOUNTER — Encounter (HOSPITAL_COMMUNITY): Payer: Medicare Other

## 2014-07-02 ENCOUNTER — Telehealth (HOSPITAL_COMMUNITY): Payer: Self-pay | Admitting: *Deleted

## 2014-07-19 DIAGNOSIS — R300 Dysuria: Secondary | ICD-10-CM | POA: Diagnosis not present

## 2014-07-19 DIAGNOSIS — R198 Other specified symptoms and signs involving the digestive system and abdomen: Secondary | ICD-10-CM | POA: Diagnosis not present

## 2014-07-20 ENCOUNTER — Other Ambulatory Visit: Payer: Self-pay | Admitting: Cardiovascular Disease

## 2014-07-20 ENCOUNTER — Other Ambulatory Visit: Payer: Self-pay | Admitting: Pharmacist Clinician (PhC)/ Clinical Pharmacy Specialist

## 2014-07-21 NOTE — Telephone Encounter (Signed)
Atorvastatin refilled.  atrovent refill routed to MD

## 2014-07-24 ENCOUNTER — Ambulatory Visit (INDEPENDENT_AMBULATORY_CARE_PROVIDER_SITE_OTHER): Payer: Medicare Other | Admitting: Pharmacist Clinician (PhC)/ Clinical Pharmacy Specialist

## 2014-07-24 DIAGNOSIS — I236 Thrombosis of atrium, auricular appendage, and ventricle as current complications following acute myocardial infarction: Secondary | ICD-10-CM

## 2014-07-24 DIAGNOSIS — I213 ST elevation (STEMI) myocardial infarction of unspecified site: Secondary | ICD-10-CM | POA: Diagnosis not present

## 2014-07-24 DIAGNOSIS — I513 Intracardiac thrombosis, not elsewhere classified: Secondary | ICD-10-CM | POA: Diagnosis not present

## 2014-07-24 DIAGNOSIS — Z7901 Long term (current) use of anticoagulants: Secondary | ICD-10-CM

## 2014-07-24 LAB — POCT INR: INR: 3

## 2014-08-26 ENCOUNTER — Inpatient Hospital Stay (HOSPITAL_COMMUNITY): Admission: RE | Admit: 2014-08-26 | Payer: Medicare Other | Source: Ambulatory Visit

## 2014-08-26 ENCOUNTER — Ambulatory Visit: Payer: Medicare Other | Admitting: Pharmacist Clinician (PhC)/ Clinical Pharmacy Specialist

## 2014-09-14 ENCOUNTER — Telehealth: Payer: Self-pay | Admitting: Cardiovascular Disease

## 2014-09-15 NOTE — Telephone Encounter (Signed)
Yes, please.

## 2014-09-15 NOTE — Telephone Encounter (Signed)
Pt asking for further refill on tramadol.  Do you want to continue this (we filled 8 and 12 weeks ago - 60 tabs each time)

## 2014-09-16 NOTE — Telephone Encounter (Signed)
Tramadol & warfarin phoned in

## 2014-09-16 NOTE — Telephone Encounter (Signed)
Pt's son called in stating that his father is completely out of Warfarin and will need to have it refilled and called in to the [facility] on [address]. Please call back  Thanks

## 2014-09-16 NOTE — Telephone Encounter (Signed)
Notified son that Rx was called in

## 2014-09-30 ENCOUNTER — Ambulatory Visit: Payer: Medicare Other | Admitting: Pharmacist Clinician (PhC)/ Clinical Pharmacy Specialist

## 2014-12-16 ENCOUNTER — Other Ambulatory Visit: Payer: Self-pay | Admitting: Cardiovascular Disease

## 2014-12-17 NOTE — Telephone Encounter (Signed)
Phoned in Rx for tramadol.  

## 2014-12-17 NOTE — Telephone Encounter (Signed)
°  1. Which medications need to be refilled? Warfarin and Tramadol  2. Which pharmacy is medication to be sent to? [facility] on [address]   3. Do they need a 30 day or 90 day supply? 30  4. Would they like a call back once the medication has been sent to the pharmacy? Yes

## 2014-12-17 NOTE — Telephone Encounter (Signed)
Warfarin refilled #15 - patient's last INR check in [facility] Jan 2016 Routed tramadol refill to Dr. Royann Shivers & Britta Mccreedy

## 2015-02-01 ENCOUNTER — Other Ambulatory Visit: Payer: Self-pay | Admitting: Cardiovascular Disease

## 2015-02-03 ENCOUNTER — Ambulatory Visit: Payer: Federal, State, Local not specified - PPO | Admitting: Pharmacist Clinician (PhC)/ Clinical Pharmacy Specialist

## 2015-02-05 ENCOUNTER — Other Ambulatory Visit: Payer: Self-pay | Admitting: Pharmacist Clinician (PhC)/ Clinical Pharmacy Specialist

## 2015-02-05 ENCOUNTER — Ambulatory Visit (INDEPENDENT_AMBULATORY_CARE_PROVIDER_SITE_OTHER): Payer: Medicare Other | Admitting: Pharmacist Clinician (PhC)/ Clinical Pharmacy Specialist

## 2015-02-05 DIAGNOSIS — I213 ST elevation (STEMI) myocardial infarction of unspecified site: Secondary | ICD-10-CM | POA: Diagnosis not present

## 2015-02-05 DIAGNOSIS — I236 Thrombosis of atrium, auricular appendage, and ventricle as current complications following acute myocardial infarction: Secondary | ICD-10-CM

## 2015-02-05 DIAGNOSIS — Z7901 Long term (current) use of anticoagulants: Secondary | ICD-10-CM | POA: Diagnosis not present

## 2015-02-05 DIAGNOSIS — I513 Intracardiac thrombosis, not elsewhere classified: Secondary | ICD-10-CM | POA: Diagnosis not present

## 2015-02-05 LAB — POCT INR: INR: 2

## 2015-02-05 MED ORDER — WARFARIN SODIUM 5 MG PO TABS: ORAL_TABLET | ORAL | Status: DC

## 2015-02-19 ENCOUNTER — Other Ambulatory Visit: Payer: Self-pay | Admitting: Cardiovascular Disease

## 2015-02-19 NOTE — Telephone Encounter (Addendum)
Phoned in Rx for tramadol per MD

## 2015-03-19 ENCOUNTER — Ambulatory Visit (INDEPENDENT_AMBULATORY_CARE_PROVIDER_SITE_OTHER): Payer: Medicare Other | Admitting: Pharmacist Clinician (PhC)/ Clinical Pharmacy Specialist

## 2015-03-19 DIAGNOSIS — I513 Intracardiac thrombosis, not elsewhere classified: Secondary | ICD-10-CM

## 2015-03-19 DIAGNOSIS — Z7901 Long term (current) use of anticoagulants: Secondary | ICD-10-CM

## 2015-03-19 DIAGNOSIS — I213 ST elevation (STEMI) myocardial infarction of unspecified site: Secondary | ICD-10-CM | POA: Diagnosis not present

## 2015-03-19 DIAGNOSIS — I236 Thrombosis of atrium, auricular appendage, and ventricle as current complications following acute myocardial infarction: Secondary | ICD-10-CM

## 2015-03-19 LAB — POCT INR: INR: 2.1

## 2015-04-11 ENCOUNTER — Other Ambulatory Visit: Payer: Self-pay | Admitting: Cardiovascular Disease

## 2015-04-12 NOTE — Telephone Encounter (Signed)
Rx request sent to pharmacy.  

## 2015-04-13 ENCOUNTER — Other Ambulatory Visit: Payer: Self-pay | Admitting: Cardiovascular Disease

## 2015-04-13 NOTE — Telephone Encounter (Signed)
Phoned in Rx for tramadol.  

## 2015-04-30 ENCOUNTER — Ambulatory Visit: Payer: Federal, State, Local not specified - PPO | Admitting: Pharmacist Clinician (PhC)/ Clinical Pharmacy Specialist

## 2015-05-04 ENCOUNTER — Encounter: Payer: Self-pay | Admitting: Cardiovascular Disease

## 2015-05-05 ENCOUNTER — Ambulatory Visit (INDEPENDENT_AMBULATORY_CARE_PROVIDER_SITE_OTHER): Payer: Medicare Other | Admitting: Pharmacist Clinician (PhC)/ Clinical Pharmacy Specialist

## 2015-05-05 DIAGNOSIS — Z7901 Long term (current) use of anticoagulants: Secondary | ICD-10-CM | POA: Diagnosis not present

## 2015-05-05 DIAGNOSIS — I213 ST elevation (STEMI) myocardial infarction of unspecified site: Secondary | ICD-10-CM

## 2015-05-05 DIAGNOSIS — I513 Intracardiac thrombosis, not elsewhere classified: Secondary | ICD-10-CM | POA: Diagnosis not present

## 2015-05-05 DIAGNOSIS — I236 Thrombosis of atrium, auricular appendage, and ventricle as current complications following acute myocardial infarction: Secondary | ICD-10-CM

## 2015-05-05 LAB — POCT INR: INR: 3

## 2015-05-19 ENCOUNTER — Other Ambulatory Visit: Payer: Self-pay | Admitting: Physician Assistant

## 2015-05-19 ENCOUNTER — Telehealth: Payer: Self-pay | Admitting: Cardiovascular Disease

## 2015-05-19 MED ORDER — WARFARIN SODIUM 5 MG PO TABS: ORAL_TABLET | ORAL | Status: DC

## 2015-05-19 NOTE — Progress Notes (Signed)
Coumadin refilled.  The patient only had two days left.  Devann Cribb, PAC

## 2015-06-11 ENCOUNTER — Ambulatory Visit: Payer: Federal, State, Local not specified - PPO | Admitting: Cardiovascular Disease

## 2015-06-14 ENCOUNTER — Telehealth: Payer: Self-pay | Admitting: Cardiovascular Disease

## 2015-06-14 ENCOUNTER — Ambulatory Visit: Payer: Federal, State, Local not specified - PPO | Admitting: Cardiovascular Disease

## 2015-06-14 ENCOUNTER — Ambulatory Visit: Payer: Federal, State, Local not specified - PPO | Admitting: Pharmacist Clinician (PhC)/ Clinical Pharmacy Specialist

## 2015-06-14 MED ORDER — TRAMADOL HCL 50 MG PO TABS: 50.0000 mg | ORAL_TABLET | Freq: Four times a day (QID) | ORAL | Status: DC | PRN

## 2015-06-14 NOTE — Telephone Encounter (Signed)
Close encounter 

## 2015-06-14 NOTE — Telephone Encounter (Signed)
OK to refill

## 2015-06-14 NOTE — Telephone Encounter (Signed)
°*  STAT* If patient is at the pharmacy, call can be transferred to refill team.   1. Which medications need to be refilled? (please list name of each medication and dose if known) Tramadol   2. Which pharmacy/location (including street and city if local pharmacy) is medication to be sent to? [facility] on [address]   3. Do they need a 30 day or 90 day supply? 60

## 2015-06-14 NOTE — Telephone Encounter (Signed)
Routed to Dr Royann Shivers for approval

## 2015-06-14 NOTE — Telephone Encounter (Signed)
Will refill medication per Dr C. Rx will be printed to be signed by [facility] and will be placed at the front desk for pick up.  Attempted to contact pharmacy to verbally refill medication - pharmacy staff never answered phone. Attempted 3 times.  Attempted to contact patient - all numbers are invalid/disconnected.  Attmepted to contact patient's emergency contact - all numbers are invalid/disconnected.

## 2015-06-18 ENCOUNTER — Ambulatory Visit (INDEPENDENT_AMBULATORY_CARE_PROVIDER_SITE_OTHER): Payer: Medicare Other | Admitting: Pharmacist Clinician (PhC)/ Clinical Pharmacy Specialist

## 2015-06-18 DIAGNOSIS — Z7901 Long term (current) use of anticoagulants: Secondary | ICD-10-CM | POA: Diagnosis not present

## 2015-06-18 DIAGNOSIS — I213 ST elevation (STEMI) myocardial infarction of unspecified site: Secondary | ICD-10-CM

## 2015-06-18 DIAGNOSIS — I236 Thrombosis of atrium, auricular appendage, and ventricle as current complications following acute myocardial infarction: Secondary | ICD-10-CM

## 2015-06-18 DIAGNOSIS — I513 Intracardiac thrombosis, not elsewhere classified: Secondary | ICD-10-CM

## 2015-06-18 LAB — POCT INR: INR: 2.5

## 2015-06-24 ENCOUNTER — Other Ambulatory Visit: Payer: Self-pay | Admitting: Cardiovascular Disease

## 2015-06-24 NOTE — Telephone Encounter (Signed)
Rx request sent to pharmacy.  

## 2015-07-15 ENCOUNTER — Ambulatory Visit (INDEPENDENT_AMBULATORY_CARE_PROVIDER_SITE_OTHER): Payer: Federal, State, Local not specified - PPO | Admitting: Cardiovascular Disease

## 2015-07-15 DIAGNOSIS — E785 Hyperlipidemia, unspecified: Secondary | ICD-10-CM

## 2015-07-15 DIAGNOSIS — R0602 Shortness of breath: Secondary | ICD-10-CM

## 2015-07-16 NOTE — Progress Notes (Signed)
Appointment canceled by patient

## 2015-07-30 ENCOUNTER — Ambulatory Visit: Payer: Federal, State, Local not specified - PPO | Admitting: Cardiovascular Disease

## 2015-08-02 ENCOUNTER — Ambulatory Visit: Payer: Federal, State, Local not specified - PPO | Admitting: Pharmacist Clinician (PhC)/ Clinical Pharmacy Specialist

## 2015-08-02 ENCOUNTER — Ambulatory Visit: Payer: Federal, State, Local not specified - PPO | Admitting: Cardiovascular Disease

## 2015-08-06 ENCOUNTER — Ambulatory Visit: Payer: Federal, State, Local not specified - PPO | Admitting: Pharmacist Clinician (PhC)/ Clinical Pharmacy Specialist

## 2015-08-20 ENCOUNTER — Other Ambulatory Visit: Payer: Self-pay | Admitting: Cardiovascular Disease

## 2015-08-20 NOTE — Telephone Encounter (Signed)
REFILL 

## 2015-08-25 ENCOUNTER — Ambulatory Visit: Payer: Federal, State, Local not specified - PPO | Admitting: Cardiovascular Disease

## 2015-08-25 ENCOUNTER — Encounter: Payer: Federal, State, Local not specified - PPO | Admitting: Pharmacist Clinician (PhC)/ Clinical Pharmacy Specialist

## 2015-08-26 ENCOUNTER — Encounter: Payer: Self-pay | Admitting: *Deleted

## 2016-02-06 ENCOUNTER — Emergency Department (HOSPITAL_COMMUNITY): Payer: Medicare Other

## 2016-02-06 ENCOUNTER — Emergency Department (HOSPITAL_COMMUNITY)
Admission: EM | Admit: 2016-02-06 | Discharge: 2016-02-06 | Disposition: A | Discharge status: Home / Self Care | Payer: Medicare Other | Attending: Emergency Medicine | Admitting: Emergency Medicine

## 2016-02-06 ENCOUNTER — Encounter (HOSPITAL_COMMUNITY): Payer: Self-pay | Admitting: Emergency Medicine

## 2016-02-06 DIAGNOSIS — Z8673 Personal history of transient ischemic attack (TIA), and cerebral infarction without residual deficits: Secondary | ICD-10-CM | POA: Diagnosis not present

## 2016-02-06 DIAGNOSIS — S0990XA Unspecified injury of head, initial encounter: Secondary | ICD-10-CM | POA: Diagnosis not present

## 2016-02-06 DIAGNOSIS — I129 Hypertensive chronic kidney disease with stage 1 through stage 4 chronic kidney disease, or unspecified chronic kidney disease: Secondary | ICD-10-CM | POA: Diagnosis not present

## 2016-02-06 DIAGNOSIS — Z87891 Personal history of nicotine dependence: Secondary | ICD-10-CM | POA: Diagnosis not present

## 2016-02-06 DIAGNOSIS — I252 Old myocardial infarction: Secondary | ICD-10-CM | POA: Insufficient documentation

## 2016-02-06 DIAGNOSIS — Z7982 Long term (current) use of aspirin: Secondary | ICD-10-CM | POA: Diagnosis not present

## 2016-02-06 DIAGNOSIS — Z7901 Long term (current) use of anticoagulants: Secondary | ICD-10-CM | POA: Diagnosis not present

## 2016-02-06 DIAGNOSIS — R479 Unspecified speech disturbances: Secondary | ICD-10-CM | POA: Diagnosis not present

## 2016-02-06 DIAGNOSIS — M25561 Pain in right knee: Secondary | ICD-10-CM | POA: Diagnosis not present

## 2016-02-06 DIAGNOSIS — M545 Low back pain: Secondary | ICD-10-CM | POA: Diagnosis not present

## 2016-02-06 DIAGNOSIS — J449 Chronic obstructive pulmonary disease, unspecified: Secondary | ICD-10-CM | POA: Diagnosis not present

## 2016-02-06 DIAGNOSIS — W19XXXA Unspecified fall, initial encounter: Secondary | ICD-10-CM | POA: Diagnosis not present

## 2016-02-06 DIAGNOSIS — R51 Headache: Secondary | ICD-10-CM | POA: Insufficient documentation

## 2016-02-06 DIAGNOSIS — Z951 Presence of aortocoronary bypass graft: Secondary | ICD-10-CM | POA: Insufficient documentation

## 2016-02-06 DIAGNOSIS — N183 Chronic kidney disease, stage 3 (moderate): Secondary | ICD-10-CM | POA: Insufficient documentation

## 2016-02-06 DIAGNOSIS — Y92009 Unspecified place in unspecified non-institutional (private) residence as the place of occurrence of the external cause: Secondary | ICD-10-CM | POA: Diagnosis not present

## 2016-02-06 DIAGNOSIS — Y9389 Activity, other specified: Secondary | ICD-10-CM | POA: Insufficient documentation

## 2016-02-06 DIAGNOSIS — M542 Cervicalgia: Secondary | ICD-10-CM | POA: Diagnosis not present

## 2016-02-06 DIAGNOSIS — I2510 Atherosclerotic heart disease of native coronary artery without angina pectoris: Secondary | ICD-10-CM | POA: Diagnosis not present

## 2016-02-06 DIAGNOSIS — Y999 Unspecified external cause status: Secondary | ICD-10-CM | POA: Insufficient documentation

## 2016-02-06 DIAGNOSIS — E119 Type 2 diabetes mellitus without complications: Secondary | ICD-10-CM | POA: Insufficient documentation

## 2016-02-06 LAB — URINE MICROSCOPIC-ADD ON

## 2016-02-06 LAB — I-STAT CHEM 8, ED
BUN: 13 mg/dL (ref 6–20)
CHLORIDE: 102 mmol/L (ref 101–111)
Calcium, Ion: 1.13 mmol/L (ref 1.12–1.23)
Creatinine, Ser: 1.3 mg/dL — ABNORMAL HIGH (ref 0.61–1.24)
Glucose, Bld: 114 mg/dL — ABNORMAL HIGH (ref 65–99)
HEMATOCRIT: 45 % (ref 39.0–52.0)
Hemoglobin: 15.3 g/dL (ref 13.0–17.0)
POTASSIUM: 4.6 mmol/L (ref 3.5–5.1)
SODIUM: 141 mmol/L (ref 135–145)
TCO2: 27 mmol/L (ref 0–100)

## 2016-02-06 LAB — URINALYSIS, ROUTINE W REFLEX MICROSCOPIC
Bilirubin Urine: NEGATIVE
Glucose, UA: NEGATIVE mg/dL
Ketones, ur: NEGATIVE mg/dL
NITRITE: NEGATIVE
PH: 7 (ref 5.0–8.0)
Protein, ur: 100 mg/dL — AB
SPECIFIC GRAVITY, URINE: 1.015 (ref 1.005–1.030)

## 2016-02-06 MED ORDER — ACETAMINOPHEN 500 MG PO TABS
500.0000 mg | ORAL_TABLET | Freq: Once | ORAL | Status: AC
  Administered 2016-02-06: 500 mg via ORAL
  Filled 2016-02-06: qty 1

## 2016-02-06 NOTE — ED Notes (Signed)
Patient transported to radiology

## 2016-02-06 NOTE — ED Notes (Signed)
Pt is back in room

## 2016-02-06 NOTE — Discharge Instructions (Signed)

## 2016-02-06 NOTE — ED Triage Notes (Signed)
Pt sts multiple falls over last several days; pt sts fell today and hit head and right side area; per family pt has taken him self off all meds including coumadin x 1 month ago

## 2016-02-06 NOTE — ED Provider Notes (Signed)
[facility]-EMERGENCY DEPT Provider Note   CSN: [id number] Arrival date & time: 02/06/16  1214  First Provider Contact:  First MD Initiated Contact with Patient 02/06/16 1301     History   Chief Complaint Chief Complaint  Patient presents with  . Fall   HPI Charles Hatfield is a 75 y.o. male.  The history is provided by the patient. No language interpreter was used.  Fall  This is a new problem. The current episode started yesterday. The problem occurs rarely. The problem has been resolved. Associated symptoms include headaches. Pertinent negatives include no chest pain, no abdominal pain and no shortness of breath. Nothing aggravates the symptoms. Nothing relieves the symptoms. He has tried nothing for the symptoms. The treatment provided no relief.    Past Medical History:  Diagnosis Date  . Atrial fibrillation (HCC)    perioperative  . CAD (coronary artery disease)   . Carotid stenosis    bilateral  . COPD (chronic obstructive pulmonary disease) (HCC)   . Critical lower limb ischemia   . Diabetes mellitus   . DM (diabetes mellitus) (HCC) 01/15/2012  . Dyslipidemia   . H/O ascending aorta repair   . Hematuria 01/15/2012  . Hypertension   . Ischemic cardiomyopathy   . Left ventricular apical thrombus following MI, NSTEMI, 01/13/12 01/15/2012  . LV (left ventricular) mural thrombus (HCC)    inferapical  . S/P CABG x 3 01/22/2012   LIMA to LAD,SVG to second diagonal,SVG to distal RCA  . Staghorn renal calculus 01/15/2012  . Stroke (HCC)   . Thrombocytopenia [facility])     Patient Active Problem List   Diagnosis Date Noted  . Mixed hyperlipidemia 06/15/2013  . CKD (chronic kidney disease) stage 3, GFR 30-59 ml/min 03/02/2013  . Critical lower limb ischemia 02/10/2013  . Preop cardiovascular exam 12/18/2012  . Long term current use of anticoagulant therapy 09/12/2012  . Occlusion and stenosis of carotid artery without mention of cerebral infarction 02/13/2012  . S/P CABG x 3      01/23/12 01/24/2012  . S/P carotid endarterectomy   Lt, 01/23/12 01/24/2012  . Carotid arterial disease, severe LICA stenosis 01/18/2012  . History of stroke 01/18/2012  . CAD (coronary artery disease) status post CABG July 2013 01/17/2012  . COPD (chronic obstructive pulmonary disease) (HCC) 01/17/2012  . Acute renal insufficiency, SCr up to 1.43 01/17/2012  . Ischemic cardiomyopathy, EF 40-45% 2D 01/15/12 01/17/2012  . Staghorn renal calculus 01/15/2012  . Left ventricular apical thrombus following MI, NSTEMI, 01/13/12 01/15/2012  . DM (diabetes mellitus) (HCC) 01/15/2012  . NSTEMI (non-ST elevated myocardial infarction) (HCC) 01/14/2012  . HTN (hypertension) 01/14/2012  . Thrombocytopenia (HCC) 01/14/2012  . Tobacco abuse 01/14/2012  . H/O ascending aorta repair     Past Surgical History:  Procedure Laterality Date  . ABDOMINAL ANGIOGRAM  03/03/2013   Procedure: ABDOMINAL ANGIOGRAM;  Surgeon: Runell Gess, MD;  Location: [facility] CATH LAB;  Service: Cardiovascular;;  . CAROTID ENDARTERECTOMY  01/13/2012  . CORONARY ANGIOGRAM  01/16/2012   Procedure: CORONARY ANGIOGRAM;  Surgeon: Thurmon Fair, MD;  Location: [facility] CATH LAB;  Service: Cardiovascular;;  . CORONARY ARTERY BYPASS GRAFT  01/22/2012   Procedure: CORONARY ARTERY BYPASS GRAFTING (CABG);  Surgeon: Kerin Perna, MD;  Location: [facility] OR;  Service: Open Heart Surgery;  Laterality: N/A;  CABG x three using left internal mammary artery and right leg greater saphenous vein harvested endoscopically  . ENDARTERECTOMY  01/22/2012   Procedure: ENDARTERECTOMY CAROTID;  Surgeon: Tawanna Cooler  Shirline Frees, MD;  Location: [facility] OR;  Service: Vascular;  Laterality: Left;  . LOWER EXTREMITY ANGIOGRAM Left 03/03/2013   Procedure: LOWER EXTREMITY ANGIOGRAM;  Surgeon: Runell Gess, MD;  Location: [facility] CATH LAB;  Service: Cardiovascular;  Laterality: Left;  . NM MYOVIEW LTD  02/20/2012   infarct/scar w/mild perinfarct ischemia, EF 38%, abnormal nuc, no sign. ischemia  .  REPAIR THORACIC AORTA     Ascending thoracic aorto repair       Home Medications    Prior to Admission medications   Medication Sig Start Date End Date Taking? Authorizing Provider  acetaminophen (TYLENOL) 500 MG tablet Take 500 mg by mouth every 6 (six) hours as needed for pain.   Yes Historical Provider, MD  albuterol (PROVENTIL HFA;VENTOLIN HFA) 108 (90 BASE) MCG/ACT inhaler Inhale 2 puffs into the lungs every 6 (six) hours as needed for wheezing. 06/25/13  Yes Runell Gess, MD  aspirin EC 81 MG tablet Take 81 mg by mouth daily.   Yes Historical Provider, MD  atorvastatin (LIPITOR) 20 MG tablet TAKE ONE TABLET BY MOUTH ONCE DAILY Patient taking differently: TAKE 20 MG BY MOUTH ONCE DAILY 08/20/15  Yes Mihai Croitoru, MD  bisacodyl (BISACODYL) 5 MG EC tablet Take 5 mg by mouth daily as needed for constipation.   Yes Historical Provider, MD  ipratropium (ATROVENT HFA) 17 MCG/ACT inhaler Inhale 2 puffs into the lungs every 6 (six) hours as needed for wheezing. 07/21/14  Yes Mihai Croitoru, MD  lisinopril (PRINIVIL,ZESTRIL) 5 MG tablet TAKE ONE TABLET BY MOUTH ONCE DAILY Patient taking differently: TAKE 5 MG BY MOUTH ONCE DAILY 08/20/15  Yes Mihai Croitoru, MD  warfarin (COUMADIN) 5 MG tablet TAKE ONE TABLET BY MOUTH ONCE DAILY OR AS  DIRECTED  BY  COUMADIN  CLINIC Patient taking differently: Take 2.5-5 mg by mouth daily. Take 2.5 mg by mouth on Monday, Wednesday and Friday. Take 5 mg by mouth on all other days. 05/19/15  Yes Bryan W Hager, PA-C  NITROSTAT 0.4 MG SL tablet DISSOLVE ONE TABLET UNDER THE TONGUE EVERY 5 MINUTES AS NEEDED FOR CHEST PAIN.  DO NOT EXCEED A TOTAL OF 3 DOSES IN 15 MINUTES NOW 06/24/15   Mihai Croitoru, MD  traMADol (ULTRAM) 50 MG tablet TAKE ONE TABLET BY MOUTH EVERY 6 HOURS AS NEEDED FOR MODERATE PAIN Patient not taking: Reported on 02/06/2016 08/20/15   Thurmon Fair, MD    Family History Family History  Problem Relation Age of Onset  . Heart disease Father     . Heart attack Brother     Social History Social History  Substance Use Topics  . Smoking status: Former Smoker    Packs/day: 1.00    Types: Cigarettes    Quit date: 01/13/2012  . Smokeless tobacco: Never Used  . Alcohol use No     Allergies   Review of patient's allergies indicates no known allergies.   Review of Systems Review of Systems  Constitutional: Negative for chills and fever.  HENT: Negative for ear pain and sore throat.   Eyes: Negative for pain and visual disturbance.  Respiratory: Negative for cough and shortness of breath.   Cardiovascular: Negative for chest pain and palpitations.  Gastrointestinal: Negative for abdominal pain and vomiting.  Genitourinary: Negative for dysuria and hematuria.  Musculoskeletal: Negative for arthralgias and back pain.  Skin: Negative for color change and rash.  Neurological: Positive for facial asymmetry, speech difficulty and headaches. Negative for seizures and syncope.  All other systems  reviewed and are negative.    Physical Exam Updated Vital Signs BP 138/64   Pulse 68   Temp 97.8 F (36.6 C) (Oral)   Resp 21   SpO2 98%   Physical Exam  Constitutional: He is oriented to person, place, and time. He appears well-developed and well-nourished.  HENT:  Head: Normocephalic and atraumatic.  Eyes: Conjunctivae are normal.  Neck: Neck supple.  Cardiovascular: Normal rate and regular rhythm.   No murmur heard. Pulmonary/Chest: Effort normal and breath sounds normal. No respiratory distress.  Abdominal: Soft. There is no tenderness.  Musculoskeletal: He exhibits no edema.  Neurological: He is alert and oriented to person, place, and time. A cranial nerve deficit is present. He exhibits normal muscle tone.  Left-sided facial droop, slurred speech (old per son)  Normal strength and sensation in bilateral upper and lower remedies. Normal deep tendon reflexes, normal coordination.  Skin: Skin is warm and dry.   Psychiatric: He has a normal mood and affect.  Nursing note and vitals reviewed.    ED Treatments / Results  Labs (all labs ordered are listed, but only abnormal results are displayed) Labs Reviewed  URINALYSIS, ROUTINE W REFLEX MICROSCOPIC (NOT AT [facility]) - Abnormal; Notable for the following:       Result Value   APPearance CLOUDY (*)    Hgb urine dipstick MODERATE (*)    Protein, ur 100 (*)    Leukocytes, UA SMALL (*)    All other components within normal limits  URINE MICROSCOPIC-ADD ON - Abnormal; Notable for the following:    Squamous Epithelial / LPF 0-5 (*)    Bacteria, UA RARE (*)    All other components within normal limits  I-STAT CHEM 8, ED - Abnormal; Notable for the following:    Creatinine, Ser 1.30 (*)    Glucose, Bld 114 (*)    All other components within normal limits    EKG  EKG Interpretation  Date/Time:  Sunday February 06 2016 13:17:56 EDT Ventricular Rate:  72 PR Interval:    QRS Duration: 96 QT Interval:  378 QTC Calculation: 414 R Axis:   33 Text Interpretation:  Sinus rhythm Inferior infarct, old Consider anterolateral infarct no significant change since 2014 Confirmed by GOLDSTON MD, SCOTT [phone]) on 02/06/2016 1:22:12 PM       Radiology Dg Lumbar Spine Complete  Result Date: 02/06/2016 CLINICAL DATA:  75 year old male status post fall at home landing on back. Back and right knee pain. Initial encounter. EXAM: LUMBAR SPINE - COMPLETE 4+ VIEW COMPARISON:  CT Abdomen and Pelvis 01/15/2012 FINDINGS: Chronic 3 cm calculus in the left renal pelvis. Multiple other smaller left renal calculi. Aortoiliac calcified atherosclerosis noted. Sequelae of aortic bypass demonstrated on the comparison. Retroperitoneal and inguinal surgical clips. Normal lumbar segmentation. Stable vertebral height and alignment with chronic straightening of lordosis. Widespread chronic anterior endplate spurring. No pars fracture. Visible lower thoracic levels intact. SI joints  appear within normal limits. The sacrum appears grossly intact. IMPRESSION: 1.  No acute fracture or listhesis identified in the lumbar spine. 2. Calcified aortic atherosclerosis. Status post distal aortic bypass. 3. Chronic 3 cm left renal calculus and smaller left nephrolithiasis. Electronically Signed   By: Odessa Fleming M.D.   On: 02/06/2016 14:21   Ct Head Wo Contrast  Result Date: 02/06/2016 CLINICAL DATA:  Multiple falls over past several days, states he fell today striking head on RIGHT side, posterior neck pain, hypertension, diabetes mellitus, prior stroke, atrial fibrillation, COPD, coronary artery  disease post CABG, ischemic cardiomyopathy. EXAM: CT HEAD WITHOUT CONTRAST CT CERVICAL SPINE WITHOUT CONTRAST TECHNIQUE: Multidetector CT imaging of the head and cervical spine was performed following the standard protocol without intravenous contrast. Multiplanar CT image reconstructions of the cervical spine were also generated. COMPARISON:  CT head and cervical spine 05/01/2004, CTA head and neck 01/19/2012 FINDINGS: CT HEAD FINDINGS Generalized atrophy. Normal ventricular morphology. No midline shift or mass effect. Small vessel chronic ischemic changes of deep cerebral white matter. Old LEFT parieto-occipital, RIGHT frontal, and RIGHT posterior parietal infarcts. Old RIGHT thalamic lacunar infarct. No intracranial hemorrhage, mass lesion, or evidence acute infarction. Atherosclerotic calcifications of the internal carotid and vertebral arteries at skullbase. Paranasal sinuses and mastoid air cells clear. Bones unremarkable. CT CERVICAL SPINE FINDINGS Prevertebral soft tissues normal thickness. Multilevel disc space narrowing and extensive spur formation C3 through C7. AP narrowing of spinal canal at C3-C4 due to posterior endplate spur formation and disc bulge. Vertebral body heights maintained with minimal retrolisthesis at C4-C5. Multilevel mild facet degenerative changes bilaterally. No acute fracture,  additional subluxation, or bone destruction. C1-C2 alignment normal. Visualized skullbase intact. Scattered vertebral and carotid arterial calcifications. Lung apices clear. IMPRESSION: Atrophy with small vessel chronic ischemic changes of deep cerebral white matter. Multiple old infarcts, unchanged. No acute intracranial abnormalities. Advanced degenerative disc disease changes cervical spine with mild scattered degenerative facet disease. AP spinal stenosis at C3-C4 due to endplate spurring and disc bulge. New line no acute cervical spine abnormalities. Electronically Signed   By: Ulyses Southward M.D.   On: 02/06/2016 14:10   Ct Cervical Spine Wo Contrast  Result Date: 02/06/2016 CLINICAL DATA:  Multiple falls over past several days, states he fell today striking head on RIGHT side, posterior neck pain, hypertension, diabetes mellitus, prior stroke, atrial fibrillation, COPD, coronary artery disease post CABG, ischemic cardiomyopathy. EXAM: CT HEAD WITHOUT CONTRAST CT CERVICAL SPINE WITHOUT CONTRAST TECHNIQUE: Multidetector CT imaging of the head and cervical spine was performed following the standard protocol without intravenous contrast. Multiplanar CT image reconstructions of the cervical spine were also generated. COMPARISON:  CT head and cervical spine 05/01/2004, CTA head and neck 01/19/2012 FINDINGS: CT HEAD FINDINGS Generalized atrophy. Normal ventricular morphology. No midline shift or mass effect. Small vessel chronic ischemic changes of deep cerebral white matter. Old LEFT parieto-occipital, RIGHT frontal, and RIGHT posterior parietal infarcts. Old RIGHT thalamic lacunar infarct. No intracranial hemorrhage, mass lesion, or evidence acute infarction. Atherosclerotic calcifications of the internal carotid and vertebral arteries at skullbase. Paranasal sinuses and mastoid air cells clear. Bones unremarkable. CT CERVICAL SPINE FINDINGS Prevertebral soft tissues normal thickness. Multilevel disc space  narrowing and extensive spur formation C3 through C7. AP narrowing of spinal canal at C3-C4 due to posterior endplate spur formation and disc bulge. Vertebral body heights maintained with minimal retrolisthesis at C4-C5. Multilevel mild facet degenerative changes bilaterally. No acute fracture, additional subluxation, or bone destruction. C1-C2 alignment normal. Visualized skullbase intact. Scattered vertebral and carotid arterial calcifications. Lung apices clear. IMPRESSION: Atrophy with small vessel chronic ischemic changes of deep cerebral white matter. Multiple old infarcts, unchanged. No acute intracranial abnormalities. Advanced degenerative disc disease changes cervical spine with mild scattered degenerative facet disease. AP spinal stenosis at C3-C4 due to endplate spurring and disc bulge. New line no acute cervical spine abnormalities. Electronically Signed   By: Ulyses Southward M.D.   On: 02/06/2016 14:10   Dg Knee Complete 4 Views Right  Result Date: 02/06/2016 CLINICAL DATA:  75 year old male status post fall  at home, landing on back. Pain in the back and right knee. Initial encounter. EXAM: RIGHT KNEE - COMPLETE 4+ VIEW COMPARISON:  None. FINDINGS: Calcified peripheral vascular disease in the right lower extremity. Medial surgical clips compatible with previous vein harvest. The lateral view is oblique. No definite joint effusion. There does appear to be stranding in Hoffa fat pad. Patella intact. Preserved alignment. Mild medial compartment joint space loss. Lateral compartment Chondrocalcinosis which can be seen in the setting of calcium pyrophosphate deposition disease. No acute fracture or dislocation identified. IMPRESSION: 1. No acute fracture or dislocation identified about the right knee. 2. Soft tissue stranding in Hoffa fat pad which may indicate internal derangement. Lateral compartment chondrocalcinosis which can be seen in the setting of calcium pyrophosphate deposition disease.  Electronically Signed   By: Odessa Fleming M.D.   On: 02/06/2016 14:18    Procedures Procedures (including critical care time)  Medications Ordered in ED Medications  acetaminophen (TYLENOL) tablet 500 mg (500 mg Oral Given 02/06/16 1526)     Initial Impression / Assessment and Plan / ED Course  I have reviewed the triage vital signs and the nursing notes.  Pertinent labs & imaging results that were available during my care of the patient were reviewed by me and considered in my medical decision making (see chart for details).  Clinical Course    Patient is a 75 year old gentleman with history of prior strokes, CAD who presents via private vehicle for evaluation treatment following a fall yesterday. Patient lives with his son who states the patient told him today about a fall yesterday. He fell while going to the restroom, likely tripped per son. Patient with pain in the back of his head but no other neurologic deficits.  Vital signs stable. Patient alert, oriented in no acute distress. He has an old left-sided facial droop, slurred speech. No focal numbness, weakness, tingling. Patient with pain in his head, lower back, right knee.  Obtained CT head, cervical spine which showed no acute findings. There were old infarcts however no acute bleeding or signs of pathology from fall. X-ray lumbar spine, right knee without acute findings.  Patient given Tylenol for head pain. BMP unremarkable.  Urinalysis with rare bacteria however contaminated with epithelial cells. Without urinary symptoms, no clear evidence of UTI, we will not treat.  Patient stopped taking his medications one month ago because he didn't feel they were helping him. I encouraged patient to follow-up with his cardiologist and PCP to discuss restarting medications. I encouraged patient use his walker while ambulating at home as his unsteadiness is causing him to fall more frequently.  Patient ambulatory at his baseline prior to  discharge.  I discussed case with my attending, Dr. Criss Alvine.    Final Clinical Impressions(s) / ED Diagnoses   Final diagnoses:  Fall, initial encounter    New Prescriptions New Prescriptions   No medications on file     Dan Humphreys, MD 02/06/16 1547    Pricilla Loveless, MD 02/12/16 [phone]

## 2017-06-11 ENCOUNTER — Emergency Department (HOSPITAL_COMMUNITY)
Admission: EM | Admit: 2017-06-11 | Discharge: 2017-06-11 | Disposition: A | Discharge status: Home / Self Care | Payer: Medicare Other | Attending: Emergency Medicine | Admitting: Emergency Medicine

## 2017-06-11 ENCOUNTER — Emergency Department (HOSPITAL_COMMUNITY): Payer: Medicare Other

## 2017-06-11 DIAGNOSIS — N183 Chronic kidney disease, stage 3 (moderate): Secondary | ICD-10-CM | POA: Diagnosis not present

## 2017-06-11 DIAGNOSIS — Z79899 Other long term (current) drug therapy: Secondary | ICD-10-CM | POA: Diagnosis not present

## 2017-06-11 DIAGNOSIS — Z87891 Personal history of nicotine dependence: Secondary | ICD-10-CM | POA: Insufficient documentation

## 2017-06-11 DIAGNOSIS — Z7982 Long term (current) use of aspirin: Secondary | ICD-10-CM | POA: Diagnosis not present

## 2017-06-11 DIAGNOSIS — J984 Other disorders of lung: Secondary | ICD-10-CM | POA: Diagnosis not present

## 2017-06-11 DIAGNOSIS — I2510 Atherosclerotic heart disease of native coronary artery without angina pectoris: Secondary | ICD-10-CM | POA: Diagnosis not present

## 2017-06-11 DIAGNOSIS — E1122 Type 2 diabetes mellitus with diabetic chronic kidney disease: Secondary | ICD-10-CM | POA: Diagnosis not present

## 2017-06-11 DIAGNOSIS — I129 Hypertensive chronic kidney disease with stage 1 through stage 4 chronic kidney disease, or unspecified chronic kidney disease: Secondary | ICD-10-CM | POA: Diagnosis not present

## 2017-06-11 DIAGNOSIS — I252 Old myocardial infarction: Secondary | ICD-10-CM | POA: Insufficient documentation

## 2017-06-11 DIAGNOSIS — Z951 Presence of aortocoronary bypass graft: Secondary | ICD-10-CM | POA: Diagnosis not present

## 2017-06-11 DIAGNOSIS — Z8673 Personal history of transient ischemic attack (TIA), and cerebral infarction without residual deficits: Secondary | ICD-10-CM | POA: Insufficient documentation

## 2017-06-11 DIAGNOSIS — Z7901 Long term (current) use of anticoagulants: Secondary | ICD-10-CM | POA: Insufficient documentation

## 2017-06-11 DIAGNOSIS — R0989 Other specified symptoms and signs involving the circulatory and respiratory systems: Secondary | ICD-10-CM | POA: Insufficient documentation

## 2017-06-11 DIAGNOSIS — J449 Chronic obstructive pulmonary disease, unspecified: Secondary | ICD-10-CM | POA: Insufficient documentation

## 2017-06-11 DIAGNOSIS — T17320A Food in larynx causing asphyxiation, initial encounter: Secondary | ICD-10-CM | POA: Diagnosis not present

## 2017-06-11 LAB — CBC WITH DIFFERENTIAL/PLATELET
BASOS PCT: 0 %
Basophils Absolute: 0 10*3/uL (ref 0.0–0.1)
Eosinophils Absolute: 0.2 10*3/uL (ref 0.0–0.7)
Eosinophils Relative: 2 %
HCT: 43.5 % (ref 39.0–52.0)
Hemoglobin: 15.3 g/dL (ref 13.0–17.0)
LYMPHS ABS: 0.9 10*3/uL (ref 0.7–4.0)
Lymphocytes Relative: 9 %
MCH: 34.3 pg — AB (ref 26.0–34.0)
MCHC: 35.2 g/dL (ref 30.0–36.0)
MCV: 97.5 fL (ref 78.0–100.0)
MONO ABS: 0.4 10*3/uL (ref 0.1–1.0)
MONOS PCT: 4 %
NEUTROS ABS: 9.2 10*3/uL — AB (ref 1.7–7.7)
Neutrophils Relative %: 85 %
Platelets: 121 10*3/uL — ABNORMAL LOW (ref 150–400)
RBC: 4.46 MIL/uL (ref 4.22–5.81)
RDW: 14.1 % (ref 11.5–15.5)
WBC: 10.7 10*3/uL — ABNORMAL HIGH (ref 4.0–10.5)

## 2017-06-11 LAB — BASIC METABOLIC PANEL
Anion gap: 8 (ref 5–15)
BUN: 23 mg/dL — ABNORMAL HIGH (ref 6–20)
CALCIUM: 8.9 mg/dL (ref 8.9–10.3)
CHLORIDE: 109 mmol/L (ref 101–111)
CO2: 24 mmol/L (ref 22–32)
CREATININE: 1.24 mg/dL (ref 0.61–1.24)
GFR calc Af Amer: >60 mL/min (ref >60)
GFR calc non Af Amer: 55 mL/min — ABNORMAL LOW (ref >60)
GLUCOSE: 142 mg/dL — AB (ref 65–99)
Potassium: 3.8 mmol/L (ref 3.5–5.1)
Sodium: 141 mmol/L (ref 135–145)

## 2017-06-11 LAB — CBG MONITORING, ED: Glucose-Capillary: 121 mg/dL — ABNORMAL HIGH (ref 65–99)

## 2017-06-11 NOTE — ED Triage Notes (Signed)
Patient brought in by EMS from home after choking on a bologna sandwich. Patients family reports he was able to cough up the sandwich with the heimlich maneuver, but they think he may have aspirated his drink. Patient has general weakness due to previous stroke.

## 2017-06-11 NOTE — ED Notes (Signed)
Bed: BV67 Expected date:  Expected time:  Means of arrival:  Comments: 76 m choking on food

## 2017-06-11 NOTE — ED Provider Notes (Signed)
[facility]-EMERGENCY DEPT Provider Note   CSN: [id number] Arrival date & time: 06/11/17  0041     History   Chief Complaint Chief Complaint  Patient presents with  . Aspiration    Family thinks he might have aspirated his drink after choking on food    HPI Carlota RaspberryGeorge F Cartmell is a 76 y.o. male.  Patient is a 76 year old male with past medical history of coronary artery disease, atrial fibrillation, diabetes presenting for evaluation after a choking episode.  Apparently he was eating a bologna sandwich this evening when it became stuck.  The Heimlich maneuver was performed by a family member and the obstructing food was expelled.  He then attempted to drink and may have possibly aspirated the liquids as he began to cough.  The patient states that he is now feeling better.  He was brought by EMS with an uneventful transport.  He denies to me he is having any difficulty breathing, chest pain.  The only complaint he has is that there is pain in the "nerves in his feet".  This appears to be a chronic problem that did not worsen this evening.   The history is provided by the patient.    Past Medical History:  Diagnosis Date  . Atrial fibrillation (HCC)    perioperative  . CAD (coronary artery disease)   . Carotid stenosis    bilateral  . COPD (chronic obstructive pulmonary disease) (HCC)   . Critical lower limb ischemia   . Diabetes mellitus   . DM (diabetes mellitus) (HCC) 01/15/2012  . Dyslipidemia   . H/O ascending aorta repair   . Hematuria 01/15/2012  . Hypertension   . Ischemic cardiomyopathy   . Left ventricular apical thrombus following MI, NSTEMI, 01/13/12 01/15/2012  . LV (left ventricular) mural thrombus (HCC)    inferapical  . S/P CABG x 3 01/22/2012   LIMA to LAD,SVG to second diagonal,SVG to distal RCA  . Staghorn renal calculus 01/15/2012  . Stroke (HCC)   . Thrombocytopenia [facility](HCC)     Patient Active Problem List   Diagnosis Date Noted  .  Mixed hyperlipidemia 06/15/2013  . CKD (chronic kidney disease) stage 3, GFR 30-59 ml/min (HCC) 03/02/2013  . Critical lower limb ischemia 02/10/2013  . Preop cardiovascular exam 12/18/2012  . Long term current use of anticoagulant therapy 09/12/2012  . Occlusion and stenosis of carotid artery without mention of cerebral infarction 02/13/2012  . S/P CABG x 3     01/23/12 01/24/2012  . S/P carotid endarterectomy   Lt, 01/23/12 01/24/2012  . Carotid arterial disease, severe LICA stenosis 01/18/2012  . History of stroke 01/18/2012  . CAD (coronary artery disease) status post CABG July 2013 01/17/2012  . COPD (chronic obstructive pulmonary disease) (HCC) 01/17/2012  . Acute renal insufficiency, SCr up to 1.43 01/17/2012  . Ischemic cardiomyopathy, EF 40-45% 2D 01/15/12 01/17/2012  . Staghorn renal calculus 01/15/2012  . Left ventricular apical thrombus following MI, NSTEMI, 01/13/12 01/15/2012  . DM (diabetes mellitus) (HCC) 01/15/2012  . NSTEMI (non-ST elevated myocardial infarction) (HCC) 01/14/2012  . HTN (hypertension) 01/14/2012  . Thrombocytopenia (HCC) 01/14/2012  . Tobacco abuse 01/14/2012  . H/O ascending aorta repair     Past Surgical History:  Procedure Laterality Date  . ABDOMINAL ANGIOGRAM  03/03/2013   Procedure: ABDOMINAL ANGIOGRAM;  Surgeon: Runell GessJonathan J Berry, MD;  [facility];  Service: Cardiovascular;;  . CAROTID ENDARTERECTOMY  01/13/2012  . CORONARY ANGIOGRAM  01/16/2012   Procedure:  CORONARY ANGIOGRAM;  Surgeon: Thurmon Fair, MD;  Location: [facility] CATH LAB;  Service: Cardiovascular;;  . CORONARY ARTERY BYPASS GRAFT  01/22/2012   Procedure: CORONARY ARTERY BYPASS GRAFTING (CABG);  Surgeon: Kerin Perna, MD;  Location: [facility] OR;  Service: Open Heart Surgery;  Laterality: N/A;  CABG x three using left internal mammary artery and right leg greater saphenous vein harvested endoscopically  . ENDARTERECTOMY  01/22/2012   Procedure: ENDARTERECTOMY CAROTID;  Surgeon: Larina Earthly, MD;  Location: [facility] OR;  Service: Vascular;  Laterality: Left;  . LOWER EXTREMITY ANGIOGRAM Left 03/03/2013   Procedure: LOWER EXTREMITY ANGIOGRAM;  Surgeon: Runell Gess, MD;  Location: [facility] CATH LAB;  Service: Cardiovascular;  Laterality: Left;  . NM MYOVIEW LTD  02/20/2012   infarct/scar w/mild perinfarct ischemia, EF 38%, abnormal nuc, no sign. ischemia  . REPAIR THORACIC AORTA     Ascending thoracic aorto repair       Home Medications    Prior to Admission medications   Medication Sig Start Date End Date Taking? Authorizing Provider  acetaminophen (TYLENOL) 500 MG tablet Take 500 mg by mouth every 6 (six) hours as needed for pain.   Yes [provider]  albuterol (PROVENTIL HFA;VENTOLIN HFA) 108 (90 BASE) MCG/ACT inhaler Inhale 2 puffs into the lungs every 6 (six) hours as needed for wheezing. 06/25/13   Runell Gess, MD  aspirin EC 81 MG tablet Take 81 mg by mouth daily.    [provider]  atorvastatin (LIPITOR) 20 MG tablet TAKE ONE TABLET BY MOUTH ONCE DAILY Patient taking differently: TAKE 20 MG BY MOUTH ONCE DAILY 08/20/15   Croitoru, Mihai, MD  bisacodyl (BISACODYL) 5 MG EC tablet Take 5 mg by mouth daily as needed for constipation.    [provider]  ipratropium (ATROVENT HFA) 17 MCG/ACT inhaler Inhale 2 puffs into the lungs every 6 (six) hours as needed for wheezing. 07/21/14   Croitoru, Mihai, MD  lisinopril (PRINIVIL,ZESTRIL) 5 MG tablet TAKE ONE TABLET BY MOUTH ONCE DAILY Patient taking differently: TAKE 5 MG BY MOUTH ONCE DAILY 08/20/15   Croitoru, Rachelle Hora, MD  NITROSTAT 0.4 MG SL tablet DISSOLVE ONE TABLET UNDER THE TONGUE EVERY 5 MINUTES AS NEEDED FOR CHEST PAIN.  DO NOT EXCEED A TOTAL OF 3 DOSES IN 15 MINUTES NOW 06/24/15   Croitoru, Mihai, MD  traMADol (ULTRAM) 50 MG tablet TAKE ONE TABLET BY MOUTH EVERY 6 HOURS AS NEEDED FOR MODERATE PAIN Patient not taking: Reported on 02/06/2016 08/20/15   Croitoru, Mihai, MD  warfarin (COUMADIN) 5 MG  tablet TAKE ONE TABLET BY MOUTH ONCE DAILY OR AS  DIRECTED  BY  COUMADIN  CLINIC Patient taking differently: Take 2.5-5 mg by mouth daily. Take 2.5 mg by mouth on Monday, Wednesday and Friday. Take 5 mg by mouth on all other days. 05/19/15   Dwana Melena, PA-C    Family History Family History  Problem Relation Age of Onset  . Heart disease Father   . Heart attack Brother     Social History Social History   Tobacco Use  . Smoking status: Former Smoker    Packs/day: 1.00    Types: Cigarettes    Last attempt to quit: 01/13/2012    Years since quitting: 5.4  . Smokeless tobacco: Never Used  Substance Use Topics  . Alcohol use: No  . Drug use: No     Allergies   Patient has no known allergies.   Review of Systems Review of Systems  All other systems reviewed and are negative.    Physical Exam Updated Vital Signs BP (!) 106/44 (BP Location: Left Arm)   Pulse 63   Temp (!) 97.5 F (36.4 C) (Oral)   Resp 16   SpO2 95%   Physical Exam  Constitutional: He is oriented to person, place, and time. He appears well-developed and well-nourished. No distress.  HENT:  Head: Normocephalic and atraumatic.  Mouth/Throat: Oropharynx is clear and moist.  Neck: Normal range of motion. Neck supple.  Cardiovascular: Normal rate and regular rhythm. Exam reveals no friction rub.  No murmur heard. Pulmonary/Chest: Effort normal and breath sounds normal. No respiratory distress. He has no wheezes. He has no rales.  Abdominal: Soft. Bowel sounds are normal. He exhibits no distension. There is no tenderness.  Musculoskeletal: Normal range of motion. He exhibits no edema.  Neurological: He is alert and oriented to person, place, and time. Coordination normal.  Skin: Skin is warm and dry. He is not diaphoretic.  Nursing note and vitals reviewed.    ED Treatments / Results  Labs (all labs ordered are listed, but only abnormal results are displayed) Labs Reviewed  BASIC METABOLIC  PANEL  CBC WITH DIFFERENTIAL/PLATELET    EKG  EKG Interpretation None       Radiology No results found.  Procedures Procedures (including critical care time)  Medications Ordered in ED Medications - No data to display   Initial Impression / Assessment and Plan / ED Course  I have reviewed the triage vital signs and the nursing notes.  Pertinent labs & imaging results that were available during my care of the patient were reviewed by me and considered in my medical decision making (see chart for details).  Patient is a 76 year old male presenting after choking on a bologna sandwich.  His son did the Heimlich and was able to dislodge it.  He had some gurgling respirations and was transported by EMS for evaluation.  He is now feeling better.  His lungs are clear, oxygen saturations are 100%, and his chest x-ray does not reveal any obvious abnormalities.  I see no indication for admission or further workup.  He will be discharged, to return as needed if symptoms worsen.  Final Clinical Impressions(s) / ED Diagnoses   Final diagnoses:  None    ED Discharge Orders    None       Geoffery Lyons, MD 06/11/17 [phone]

## 2017-06-11 NOTE — Discharge Instructions (Signed)
Return to the emergency department for chest pain, difficulty breathing, productive cough, fevers, or other new and concerning symptoms.

## 2017-06-25 ENCOUNTER — Emergency Department (HOSPITAL_COMMUNITY): Payer: Medicare Other

## 2017-06-25 ENCOUNTER — Inpatient Hospital Stay (HOSPITAL_COMMUNITY)
Admission: EM | Admit: 2017-06-25 | Discharge: 2017-06-28 | DRG: 065 | Disposition: A | Discharge status: Rehabilitation Facility | Payer: Medicare Other | Attending: Internal Medicine | Admitting: Internal Medicine

## 2017-06-25 ENCOUNTER — Encounter (HOSPITAL_COMMUNITY): Payer: Self-pay | Admitting: *Deleted

## 2017-06-25 DIAGNOSIS — I5042 Chronic combined systolic (congestive) and diastolic (congestive) heart failure: Secondary | ICD-10-CM | POA: Diagnosis not present

## 2017-06-25 DIAGNOSIS — I480 Paroxysmal atrial fibrillation: Secondary | ICD-10-CM | POA: Diagnosis present

## 2017-06-25 DIAGNOSIS — Z8673 Personal history of transient ischemic attack (TIA), and cerebral infarction without residual deficits: Secondary | ICD-10-CM | POA: Diagnosis not present

## 2017-06-25 DIAGNOSIS — Z87891 Personal history of nicotine dependence: Secondary | ICD-10-CM | POA: Diagnosis not present

## 2017-06-25 DIAGNOSIS — R4701 Aphasia: Secondary | ICD-10-CM | POA: Diagnosis present

## 2017-06-25 DIAGNOSIS — I639 Cerebral infarction, unspecified: Secondary | ICD-10-CM

## 2017-06-25 DIAGNOSIS — R1311 Dysphagia, oral phase: Secondary | ICD-10-CM | POA: Diagnosis present

## 2017-06-25 DIAGNOSIS — N183 Chronic kidney disease, stage 3 unspecified: Secondary | ICD-10-CM | POA: Diagnosis present

## 2017-06-25 DIAGNOSIS — I236 Thrombosis of atrium, auricular appendage, and ventricle as current complications following acute myocardial infarction: Secondary | ICD-10-CM | POA: Diagnosis not present

## 2017-06-25 DIAGNOSIS — I2510 Atherosclerotic heart disease of native coronary artery without angina pectoris: Secondary | ICD-10-CM | POA: Diagnosis not present

## 2017-06-25 DIAGNOSIS — I110 Hypertensive heart disease with heart failure: Secondary | ICD-10-CM | POA: Diagnosis present

## 2017-06-25 DIAGNOSIS — E1122 Type 2 diabetes mellitus with diabetic chronic kidney disease: Secondary | ICD-10-CM | POA: Diagnosis present

## 2017-06-25 DIAGNOSIS — R471 Dysarthria and anarthria: Secondary | ICD-10-CM | POA: Diagnosis present

## 2017-06-25 DIAGNOSIS — R1319 Other dysphagia: Secondary | ICD-10-CM | POA: Diagnosis not present

## 2017-06-25 DIAGNOSIS — I672 Cerebral atherosclerosis: Secondary | ICD-10-CM | POA: Diagnosis present

## 2017-06-25 DIAGNOSIS — I69351 Hemiplegia and hemiparesis following cerebral infarction affecting right dominant side: Secondary | ICD-10-CM | POA: Diagnosis not present

## 2017-06-25 DIAGNOSIS — I513 Intracardiac thrombosis, not elsewhere classified: Secondary | ICD-10-CM | POA: Diagnosis not present

## 2017-06-25 DIAGNOSIS — R0600 Dyspnea, unspecified: Secondary | ICD-10-CM

## 2017-06-25 DIAGNOSIS — I252 Old myocardial infarction: Secondary | ICD-10-CM | POA: Diagnosis not present

## 2017-06-25 DIAGNOSIS — E1151 Type 2 diabetes mellitus with diabetic peripheral angiopathy without gangrene: Secondary | ICD-10-CM | POA: Diagnosis present

## 2017-06-25 DIAGNOSIS — Z79899 Other long term (current) drug therapy: Secondary | ICD-10-CM

## 2017-06-25 DIAGNOSIS — I63511 Cerebral infarction due to unspecified occlusion or stenosis of right middle cerebral artery: Secondary | ICD-10-CM | POA: Diagnosis not present

## 2017-06-25 DIAGNOSIS — I255 Ischemic cardiomyopathy: Secondary | ICD-10-CM | POA: Diagnosis not present

## 2017-06-25 DIAGNOSIS — E782 Mixed hyperlipidemia: Secondary | ICD-10-CM | POA: Diagnosis not present

## 2017-06-25 DIAGNOSIS — J431 Panlobular emphysema: Secondary | ICD-10-CM | POA: Diagnosis not present

## 2017-06-25 DIAGNOSIS — R0682 Tachypnea, not elsewhere classified: Secondary | ICD-10-CM

## 2017-06-25 DIAGNOSIS — E785 Hyperlipidemia, unspecified: Secondary | ICD-10-CM | POA: Diagnosis present

## 2017-06-25 DIAGNOSIS — J449 Chronic obstructive pulmonary disease, unspecified: Secondary | ICD-10-CM | POA: Diagnosis present

## 2017-06-25 DIAGNOSIS — F0280 Dementia in other diseases classified elsewhere without behavioral disturbance: Secondary | ICD-10-CM | POA: Diagnosis present

## 2017-06-25 DIAGNOSIS — Z951 Presence of aortocoronary bypass graft: Secondary | ICD-10-CM

## 2017-06-25 DIAGNOSIS — R1310 Dysphagia, unspecified: Secondary | ICD-10-CM | POA: Diagnosis not present

## 2017-06-25 DIAGNOSIS — I63431 Cerebral infarction due to embolism of right posterior cerebral artery: Principal | ICD-10-CM | POA: Diagnosis present

## 2017-06-25 DIAGNOSIS — R29810 Facial weakness: Secondary | ICD-10-CM | POA: Diagnosis present

## 2017-06-25 DIAGNOSIS — I130 Hypertensive heart and chronic kidney disease with heart failure and stage 1 through stage 4 chronic kidney disease, or unspecified chronic kidney disease: Secondary | ICD-10-CM | POA: Diagnosis present

## 2017-06-25 DIAGNOSIS — I69319 Unspecified symptoms and signs involving cognitive functions following cerebral infarction: Secondary | ICD-10-CM | POA: Diagnosis not present

## 2017-06-25 DIAGNOSIS — I63231 Cerebral infarction due to unspecified occlusion or stenosis of right carotid arteries: Secondary | ICD-10-CM | POA: Diagnosis not present

## 2017-06-25 DIAGNOSIS — I6312 Cerebral infarction due to embolism of basilar artery: Secondary | ICD-10-CM | POA: Diagnosis not present

## 2017-06-25 DIAGNOSIS — I63311 Cerebral infarction due to thrombosis of right middle cerebral artery: Secondary | ICD-10-CM | POA: Diagnosis not present

## 2017-06-25 DIAGNOSIS — I25810 Atherosclerosis of coronary artery bypass graft(s) without angina pectoris: Secondary | ICD-10-CM | POA: Diagnosis not present

## 2017-06-25 DIAGNOSIS — I5022 Chronic systolic (congestive) heart failure: Secondary | ICD-10-CM | POA: Diagnosis not present

## 2017-06-25 DIAGNOSIS — I63133 Cerebral infarction due to embolism of bilateral carotid arteries: Secondary | ICD-10-CM | POA: Diagnosis not present

## 2017-06-25 DIAGNOSIS — R32 Unspecified urinary incontinence: Secondary | ICD-10-CM | POA: Diagnosis present

## 2017-06-25 DIAGNOSIS — I10 Essential (primary) hypertension: Secondary | ICD-10-CM | POA: Diagnosis not present

## 2017-06-25 DIAGNOSIS — Z7982 Long term (current) use of aspirin: Secondary | ICD-10-CM | POA: Diagnosis not present

## 2017-06-25 DIAGNOSIS — R0602 Shortness of breath: Secondary | ICD-10-CM | POA: Diagnosis not present

## 2017-06-25 DIAGNOSIS — Z9889 Other specified postprocedural states: Secondary | ICD-10-CM | POA: Diagnosis not present

## 2017-06-25 DIAGNOSIS — G8191 Hemiplegia, unspecified affecting right dominant side: Secondary | ICD-10-CM | POA: Diagnosis not present

## 2017-06-25 DIAGNOSIS — F0150 Vascular dementia without behavioral disturbance: Secondary | ICD-10-CM | POA: Diagnosis present

## 2017-06-25 DIAGNOSIS — I69391 Dysphagia following cerebral infarction: Secondary | ICD-10-CM | POA: Diagnosis not present

## 2017-06-25 DIAGNOSIS — R1312 Dysphagia, oropharyngeal phase: Secondary | ICD-10-CM | POA: Diagnosis not present

## 2017-06-25 DIAGNOSIS — R29707 NIHSS score 7: Secondary | ICD-10-CM | POA: Diagnosis present

## 2017-06-25 DIAGNOSIS — R4781 Slurred speech: Secondary | ICD-10-CM | POA: Diagnosis not present

## 2017-06-25 DIAGNOSIS — I6523 Occlusion and stenosis of bilateral carotid arteries: Secondary | ICD-10-CM | POA: Diagnosis not present

## 2017-06-25 DIAGNOSIS — I6349 Cerebral infarction due to embolism of other cerebral artery: Secondary | ICD-10-CM | POA: Diagnosis not present

## 2017-06-25 DIAGNOSIS — Z7901 Long term (current) use of anticoagulants: Secondary | ICD-10-CM

## 2017-06-25 DIAGNOSIS — I4891 Unspecified atrial fibrillation: Secondary | ICD-10-CM | POA: Diagnosis not present

## 2017-06-25 DIAGNOSIS — Z9119 Patient's noncompliance with other medical treatment and regimen: Secondary | ICD-10-CM | POA: Diagnosis not present

## 2017-06-25 DIAGNOSIS — I482 Chronic atrial fibrillation: Secondary | ICD-10-CM | POA: Diagnosis not present

## 2017-06-25 LAB — I-STAT CHEM 8, ED
BUN: 12 mg/dL (ref 6–20)
CALCIUM ION: 1.11 mmol/L — AB (ref 1.15–1.40)
Chloride: 106 mmol/L (ref 101–111)
Creatinine, Ser: 1 mg/dL (ref 0.61–1.24)
Glucose, Bld: 112 mg/dL — ABNORMAL HIGH (ref 65–99)
HEMATOCRIT: 44 % (ref 39.0–52.0)
HEMOGLOBIN: 15 g/dL (ref 13.0–17.0)
Potassium: 4.5 mmol/L (ref 3.5–5.1)
SODIUM: 141 mmol/L (ref 135–145)
TCO2: 23 mmol/L (ref 22–32)

## 2017-06-25 LAB — COMPREHENSIVE METABOLIC PANEL
ALBUMIN: 4.1 g/dL (ref 3.5–5.0)
ALK PHOS: 80 U/L (ref 38–126)
ALT: 11 U/L — ABNORMAL LOW (ref 17–63)
ANION GAP: 7 (ref 5–15)
AST: 19 U/L (ref 15–41)
BILIRUBIN TOTAL: 0.8 mg/dL (ref 0.3–1.2)
BUN: 13 mg/dL (ref 6–20)
CALCIUM: 9.1 mg/dL (ref 8.9–10.3)
CO2: 25 mmol/L (ref 22–32)
Chloride: 109 mmol/L (ref 101–111)
Creatinine, Ser: 1.08 mg/dL (ref 0.61–1.24)
GFR calc non Af Amer: >60 mL/min (ref >60)
Glucose, Bld: 113 mg/dL — ABNORMAL HIGH (ref 65–99)
POTASSIUM: 4.5 mmol/L (ref 3.5–5.1)
SODIUM: 141 mmol/L (ref 135–145)
TOTAL PROTEIN: 6.9 g/dL (ref 6.5–8.1)

## 2017-06-25 LAB — RAPID URINE DRUG SCREEN, HOSP PERFORMED
Amphetamines: NOT DETECTED
Barbiturates: NOT DETECTED
Benzodiazepines: NOT DETECTED
COCAINE: NOT DETECTED
OPIATES: NOT DETECTED
Tetrahydrocannabinol: NOT DETECTED

## 2017-06-25 LAB — DIFFERENTIAL
Basophils Absolute: 0 10*3/uL (ref 0.0–0.1)
Basophils Relative: 1 %
EOS ABS: 0.2 10*3/uL (ref 0.0–0.7)
EOS PCT: 3 %
LYMPHS PCT: 22 %
Lymphs Abs: 1.9 10*3/uL (ref 0.7–4.0)
MONO ABS: 0.3 10*3/uL (ref 0.1–1.0)
Monocytes Relative: 3 %
NEUTROS PCT: 71 %
Neutro Abs: 6.1 10*3/uL (ref 1.7–7.7)

## 2017-06-25 LAB — CBC
HCT: 44.9 % (ref 39.0–52.0)
Hemoglobin: 15.6 g/dL (ref 13.0–17.0)
MCH: 33.6 pg (ref 26.0–34.0)
MCHC: 34.7 g/dL (ref 30.0–36.0)
MCV: 96.8 fL (ref 78.0–100.0)
PLATELETS: 172 10*3/uL (ref 150–400)
RBC: 4.64 MIL/uL (ref 4.22–5.81)
RDW: 14.2 % (ref 11.5–15.5)
WBC: 8.5 10*3/uL (ref 4.0–10.5)

## 2017-06-25 LAB — URINALYSIS, ROUTINE W REFLEX MICROSCOPIC
BACTERIA UA: NONE SEEN
BILIRUBIN URINE: NEGATIVE
Glucose, UA: NEGATIVE mg/dL
KETONES UR: NEGATIVE mg/dL
LEUKOCYTES UA: NEGATIVE
NITRITE: NEGATIVE
PROTEIN: NEGATIVE mg/dL
Specific Gravity, Urine: 1.021 (ref 1.005–1.030)
pH: 7 (ref 5.0–8.0)

## 2017-06-25 LAB — APTT: aPTT: 28 seconds (ref 24–36)

## 2017-06-25 LAB — PROTIME-INR
INR: 1.08
PROTHROMBIN TIME: 13.9 s (ref 11.4–15.2)

## 2017-06-25 LAB — CBG MONITORING, ED: GLUCOSE-CAPILLARY: 111 mg/dL — AB (ref 65–99)

## 2017-06-25 LAB — I-STAT TROPONIN, ED: Troponin i, poc: 0.04 ng/mL (ref 0.00–0.08)

## 2017-06-25 LAB — ETHANOL

## 2017-06-25 MED ORDER — IOPAMIDOL (ISOVUE-370) INJECTION 76%
75.0000 mL | Freq: Once | INTRAVENOUS | Status: AC | PRN
  Administered 2017-06-25: 75 mL via INTRAVENOUS

## 2017-06-25 MED ORDER — IOPAMIDOL (ISOVUE-370) INJECTION 76%
INTRAVENOUS | Status: AC
  Filled 2017-06-25: qty 100

## 2017-06-25 NOTE — ED Provider Notes (Deleted)
[facility]-EMERGENCY DEPT Provider Note   CSN: [id number] Arrival date & time: 06/25/17  1730     History   Chief Complaint Chief Complaint  Patient presents with  . Facial Droop  . Aphasia    HPI Charles Hatfield is a 76 y.o. male.  Patient was having argument with the family and cut her left wrist and her mother stated that she also took some pills.  Patient has been taking Zoloft.   The history is provided by the patient and a relative. No language interpreter was used.  Ingestion  This is a new problem. The current episode started less than 1 hour ago. The problem occurs rarely. The problem has been resolved. Pertinent negatives include no chest pain, no abdominal pain and no headaches. Nothing aggravates the symptoms. Nothing relieves the symptoms. He has tried nothing for the symptoms. The treatment provided no relief.    Past Medical History:  Diagnosis Date  . Atrial fibrillation (HCC)    perioperative  . CAD (coronary artery disease)   . Carotid stenosis    bilateral  . COPD (chronic obstructive pulmonary disease) (HCC)   . Critical lower limb ischemia   . Diabetes mellitus   . DM (diabetes mellitus) (HCC) 01/15/2012  . Dyslipidemia   . H/O ascending aorta repair   . Hematuria 01/15/2012  . Hypertension   . Ischemic cardiomyopathy   . Left ventricular apical thrombus following MI, NSTEMI, 01/13/12 01/15/2012  . LV (left ventricular) mural thrombus    inferapical  . S/P CABG x 3 01/22/2012   LIMA to LAD,SVG to second diagonal,SVG to distal RCA  . Staghorn renal calculus 01/15/2012  . Stroke (HCC)   . Thrombocytopenia [facility])     Patient Active Problem List   Diagnosis Date Noted  . Right hemiparesis (HCC) 06/25/2017  . Dysarthria 06/25/2017  . Dysphagia 06/25/2017  . Acute CVA (cerebrovascular accident) (HCC) 06/25/2017  . Long term (current) use of anticoagulants 06/25/2017  . Mixed hyperlipidemia 06/15/2013  . CKD (chronic kidney  disease) stage 3, GFR 30-59 ml/min (HCC) 03/02/2013  . Critical lower limb ischemia 02/10/2013  . Preop cardiovascular exam 12/18/2012  . Long term current use of anticoagulant therapy 09/12/2012  . Occlusion and stenosis of carotid artery without mention of cerebral infarction 02/13/2012  . S/P CABG x 3     01/23/12 01/24/2012  . S/P carotid endarterectomy   Lt, 01/23/12 01/24/2012  . Carotid arterial disease, severe LICA stenosis 01/18/2012  . History of stroke 01/18/2012  . CAD (coronary artery disease) status post CABG July 2013 01/17/2012  . COPD (chronic obstructive pulmonary disease) (HCC) 01/17/2012  . Acute renal insufficiency, SCr up to 1.43 01/17/2012  . Ischemic cardiomyopathy, EF 40-45% 2D 01/15/12 01/17/2012  . Staghorn renal calculus 01/15/2012  . Left ventricular apical thrombus following MI, NSTEMI, 01/13/12 01/15/2012  . DM (diabetes mellitus) (HCC) 01/15/2012  . NSTEMI (non-ST elevated myocardial infarction) (HCC) 01/14/2012  . HTN (hypertension) 01/14/2012  . Thrombocytopenia (HCC) 01/14/2012  . Tobacco abuse 01/14/2012  . H/O ascending aorta repair     Past Surgical History:  Procedure Laterality Date  . ABDOMINAL ANGIOGRAM  03/03/2013   Procedure: ABDOMINAL ANGIOGRAM;  Surgeon: Runell Gess, MD;  Location: [facility] CATH LAB;  Service: Cardiovascular;;  . CAROTID ENDARTERECTOMY  01/13/2012  . CORONARY ANGIOGRAM  01/16/2012   Procedure: CORONARY ANGIOGRAM;  Surgeon: Thurmon Fair, MD;  Location: [facility] CATH LAB;  Service: Cardiovascular;;  . CORONARY ARTERY BYPASS GRAFT  01/22/2012   Procedure: CORONARY ARTERY BYPASS GRAFTING (CABG);  Surgeon: Kerin Perna, MD;  Location: [facility] OR;  Service: Open Heart Surgery;  Laterality: N/A;  CABG x three using left internal mammary artery and right leg greater saphenous vein harvested endoscopically  . ENDARTERECTOMY  01/22/2012   Procedure: ENDARTERECTOMY CAROTID;  Surgeon: Larina Earthly, MD;  Location: [facility] OR;  Service: Vascular;   Laterality: Left;  . LOWER EXTREMITY ANGIOGRAM Left 03/03/2013   Procedure: LOWER EXTREMITY ANGIOGRAM;  Surgeon: Runell Gess, MD;  Location: [name] CATH LAB;  Service: Cardiovascular;  Laterality: Left;  . NM MYOVIEW LTD  02/20/2012   infarct/scar w/mild perinfarct ischemia, EF 38%, abnormal nuc, no sign. ischemia  . REPAIR THORACIC AORTA     Ascending thoracic aorto repair       Home Medications    Prior to Admission medications   Medication Sig Start Date End Date Taking? Authorizing Provider  acetaminophen (TYLENOL) 500 MG tablet Take 1,000 mg by mouth every 6 (six) hours as needed for pain.    Yes [provider]  aspirin EC 81 MG tablet Take 81 mg by mouth daily.   Yes [provider]  albuterol (PROVENTIL HFA;VENTOLIN HFA) 108 (90 BASE) MCG/ACT inhaler Inhale 2 puffs into the lungs every 6 (six) hours as needed for wheezing. Patient not taking: Reported on 06/11/2017 06/25/13   Runell Gess, MD  atorvastatin (LIPITOR) 20 MG tablet TAKE ONE TABLET BY MOUTH ONCE DAILY Patient not taking: Reported on 06/11/2017 08/20/15   Croitoru, Rachelle Hora, MD  ipratropium (ATROVENT HFA) 17 MCG/ACT inhaler Inhale 2 puffs into the lungs every 6 (six) hours as needed for wheezing. Patient not taking: Reported on 06/11/2017 07/21/14   Croitoru, Mihai, MD  lisinopril (PRINIVIL,ZESTRIL) 5 MG tablet TAKE ONE TABLET BY MOUTH ONCE DAILY Patient not taking: Reported on 06/11/2017 08/20/15   Croitoru, Rachelle Hora, MD  NITROSTAT 0.4 MG SL tablet DISSOLVE ONE TABLET UNDER THE TONGUE EVERY 5 MINUTES AS NEEDED FOR CHEST PAIN.  DO NOT EXCEED A TOTAL OF 3 DOSES IN 15 MINUTES NOW Patient not taking: Reported on 06/11/2017 06/24/15   Croitoru, Mihai, MD  traMADol (ULTRAM) 50 MG tablet TAKE ONE TABLET BY MOUTH EVERY 6 HOURS AS NEEDED FOR MODERATE PAIN Patient not taking: Reported on 02/06/2016 08/20/15   Croitoru, Mihai, MD  warfarin (COUMADIN) 5 MG tablet TAKE ONE TABLET BY MOUTH ONCE DAILY OR AS  DIRECTED  BY   COUMADIN  CLINIC Patient not taking: Reported on 06/11/2017 05/19/15   Dwana Melena, PA-C    Family History Family History  Problem Relation Age of Onset  . Heart disease Father   . Heart attack Brother     Social History Social History   Tobacco Use  . Smoking status: Former Smoker    Packs/day: 1.00    Types: Cigarettes    Last attempt to quit: 01/13/2012    Years since quitting: 5.4  . Smokeless tobacco: Never Used  Substance Use Topics  . Alcohol use: No  . Drug use: No     Allergies   Patient has no known allergies.   Review of Systems Review of Systems  Constitutional: Negative for appetite change and fatigue.  HENT: Negative for congestion, ear discharge and sinus pressure.   Eyes: Negative for discharge.  Respiratory: Negative for cough.   Cardiovascular: Negative for chest pain.  Gastrointestinal: Negative for abdominal pain and diarrhea.  Genitourinary: Negative for frequency and hematuria.  Musculoskeletal: Negative for back  pain.  Skin: Negative for rash.  Neurological: Negative for seizures and headaches.  Psychiatric/Behavioral: Positive for dysphoric mood. Negative for hallucinations.     Physical Exam Updated Vital Signs BP (!) 161/69 (BP Location: Left Arm)   Pulse 68   Temp 97.7 F (36.5 C) (Oral)   Resp 20   SpO2 97%   Physical Exam  Constitutional: He is oriented to person, place, and time. He appears well-developed.  HENT:  Head: Normocephalic.  Eyes: Conjunctivae and EOM are normal. No scleral icterus.  Neck: Neck supple. No thyromegaly present.  Cardiovascular: Normal rate and regular rhythm. Exam reveals no gallop and no friction rub.  No murmur heard. Pulmonary/Chest: No stridor. He has no wheezes. He has no rales. He exhibits no tenderness.  Abdominal: He exhibits no distension. There is no tenderness. There is no rebound.  Musculoskeletal: Normal range of motion. He exhibits no edema.  Patient has a small superficial  laceration to her left wrist  Lymphadenopathy:    He has no cervical adenopathy.  Neurological: He is oriented to person, place, and time. He exhibits normal muscle tone. Coordination normal.  Skin: No rash noted. No erythema.  Psychiatric:  Patient is depressed and suicidal.     ED Treatments / Results  Labs (all labs ordered are listed, but only abnormal results are displayed) Labs Reviewed  COMPREHENSIVE METABOLIC PANEL - Abnormal; Notable for the following components:      Result Value   Glucose, Bld 113 (*)    ALT 11 (*)    All other components within normal limits  URINALYSIS, ROUTINE W REFLEX MICROSCOPIC - Abnormal; Notable for the following components:   APPearance HAZY (*)    Hgb urine dipstick SMALL (*)    Squamous Epithelial / LPF 0-5 (*)    All other components within normal limits  I-STAT CHEM 8, ED - Abnormal; Notable for the following components:   Glucose, Bld 112 (*)    Calcium, Ion 1.11 (*)    All other components within normal limits  CBG MONITORING, ED - Abnormal; Notable for the following components:   Glucose-Capillary 111 (*)    All other components within normal limits  ETHANOL  PROTIME-INR  APTT  CBC  DIFFERENTIAL  RAPID URINE DRUG SCREEN, HOSP PERFORMED  I-STAT TROPONIN, ED    EKG  EKG Interpretation None       Radiology Ct Angio Head W Or Wo Contrast  Result Date: 06/25/2017 CLINICAL DATA:  Initial evaluation for follow-up stroke. EXAM: CT ANGIOGRAPHY HEAD AND NECK TECHNIQUE: Multidetector CT imaging of the head and neck was performed using the standard protocol during bolus administration of intravenous contrast. Multiplanar CT image reconstructions and MIPs were obtained to evaluate the vascular anatomy. Carotid stenosis measurements (when applicable) are obtained utilizing NASCET criteria, using the distal internal carotid diameter as the denominator. CONTRAST:  75mL ISOVUE-370 IOPAMIDOL (ISOVUE-370) INJECTION 76% COMPARISON:  Prior  head CT from earlier the same day. FINDINGS: CTA NECK FINDINGS Aortic arch: Extensive atheromatous plaque throughout the visualized aortic arch. No high-grade stenosis about the origin of the great vessels. Extensive atheromatous regularity throughout the subclavian artery is without high-grade stenosis. Right carotid system: Right common carotid artery is occluded at its mid aspect to the level of the bifurcation. Right ICA entirely included within the neck to the level of the terminus. Opacification of the right external carotid artery and its branches via collateral flow. Left carotid system: Left common carotid artery demonstrates multifocal atheromatous irregularity but is  patent from its origin to the bifurcation without flow-limiting stenosis. No significant atheromatous narrowing about the left bifurcation. Left ICA patent within the neck without flow-limiting stenosis. Vertebral arteries: Both of the vertebral artery is arise from the subclavian artery is. Left vertebral artery dominant. Right vertebral artery diffusely hypoplastic. Atheromatous plaque at the origin of the vertebral arteries bilaterally with moderate to severe stenoses. Extensive multifocal atheromatous irregularity throughout the vertebral artery is with resultant mild to moderate multifocal stenoses. Right vertebral artery occludes at the cranial vault. Skeleton: No acute osseus abnormality. No worrisome lytic or blastic osseous lesions. Advanced degenerative spondylolysis throughout the cervical spine with bulky anterior osteophytic spurring. Other neck: No acute soft tissue abnormality within the neck. Salivary glands within normal limits. Thyroid normal. No adenopathy. Upper chest: Median sternotomy wires partially visualized. No acute abnormality within the visualized upper chest. Emphysema. Review of the MIP images confirms the above findings CTA HEAD FINDINGS Anterior circulation: The right ICA occluded to the level of the terminus.  Multifocal plaque within the petrous left ICA with mild narrowing. Advanced atheromatous plaque throughout the cavernous/ supraclinoid left ICA with resultant moderate multifocal irregularity and narrowing. Left ICA terminus widely patent. A1 segments widely patent without stenosis. Patent anterior communicating artery. Anterior cerebral artery is widely patent to their distal aspects without flow-limiting stenosis. Perfusion of the right ICA terminus via flow cross the circle of Willis. Right M1 segment patent without stenosis. Left M1 segment patent as well. Normal MCA bifurcations. No proximal M2 occlusion. Distal MCA branches well perfused and symmetric. Posterior circulation: Left vertebral artery dominant. Multifocal atheromatous plaque throughout the left V4 segment with moderate multifocal narrowing. Left PICA patent. Hypoplastic right vertebral artery occluded at the cranial vault. Right PICA not visualized. Extensive multifocal atheromatous regularity throughout the basilar artery with moderate to severe stenoses, most notable at its distal aspect at the level of the basilar tip (series 8, image 97). Superior cerebral arteries patent bilaterally. Left PCA supplied via the basilar artery. Right PCA supplied via a hypoplastic right P1 segment as well as a right posterior communicating artery. 3 mm ovoid blush of contrast at the confluence of the right P1 segment and right P com suspicious for possible small aneurysm (series 8, image 97). Extensive atheromatous irregularity throughout the P2 segments bilaterally the without high-grade stenosis. Distal PCAs not well visualized, and may be occluded given the chronic bilateral PCA territory infarcts. Venous sinuses: Centrally positioned and linear filling defect involving the posterior aspect of the superior sagittal sinus is not seen on 5 minutes delayed sequence, and is favored to reflect a flow related artifact. No evidence for dural sinus thrombosis.  Anatomic variants: None significant. Delayed phase: No abnormal enhancement. Review of the MIP images confirms the above findings IMPRESSION: 1. Occlusion of the right common carotid artery at its mid aspect, age indeterminate, and may be chronic given at this would not fit with patient's current symptoms. Right carotid artery system is occluded to the level of the circle of Willis. Distal reconstitution at the level of the right ICA terminus with fairly robust flow throughout the right MCA territory. 2. Extensive atheromatous irregularity throughout the left ICA and MCA without large vessel occlusion. Moderate carotid siphon atherosclerosis without high-grade or correctable stenosis. 3. Extensive atheromatous irregularity throughout the vertebrobasilar system, with moderate to severe multifocal stenoses involving the basilar artery. 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 Distal PCA branches not well visualized, suspected to be occluded given the chronic bilateral PCA  territory infarcts. 4. 3 mm focal outpouching arising at the confluence of the right P1 segment and posterior communicating artery, suspicious for small aneurysm. Critical Value/emergent results were called by telephone at the time of interpretation on 06/25/2017 at 7:47 pm to Dr. Ferd Glassing , who verbally acknowledged these results. Electronically Signed   By: Rise Mu M.D.   On: 06/25/2017 20:00   Ct Angio Neck W Or Wo Contrast  Result Date: 06/25/2017 CLINICAL DATA:  Initial evaluation for follow-up stroke. EXAM: CT ANGIOGRAPHY HEAD AND NECK TECHNIQUE: Multidetector CT imaging of the head and neck was performed using the standard protocol during bolus administration of intravenous contrast. Multiplanar CT image reconstructions and MIPs were obtained to evaluate the vascular anatomy. Carotid stenosis measurements (when applicable) are obtained utilizing NASCET criteria, using the distal  internal carotid diameter as the denominator. CONTRAST:  45mL ISOVUE-370 IOPAMIDOL (ISOVUE-370) INJECTION 76% COMPARISON:  Prior head CT from earlier the same day. FINDINGS: CTA NECK FINDINGS Aortic arch: Extensive atheromatous plaque throughout the visualized aortic arch. No high-grade stenosis about the origin of the great vessels. Extensive atheromatous regularity throughout the subclavian artery is without high-grade stenosis. Right carotid system: Right common carotid artery is occluded at its mid aspect to the level of the bifurcation. Right ICA entirely included within the neck to the level of the terminus. Opacification of the right external carotid artery and its branches via collateral flow. Left carotid system: Left common carotid artery demonstrates multifocal atheromatous irregularity but is patent from its origin to the bifurcation without flow-limiting stenosis. No significant atheromatous narrowing about the left bifurcation. Left ICA patent within the neck without flow-limiting stenosis. Vertebral arteries: Both of the vertebral artery is arise from the subclavian artery is. Left vertebral artery dominant. Right vertebral artery diffusely hypoplastic. Atheromatous plaque at the origin of the vertebral arteries bilaterally with moderate to severe stenoses. Extensive multifocal atheromatous irregularity throughout the vertebral artery is with resultant mild to moderate multifocal stenoses. Right vertebral artery occludes at the cranial vault. Skeleton: No acute osseus abnormality. No worrisome lytic or blastic osseous lesions. Advanced degenerative spondylolysis throughout the cervical spine with bulky anterior osteophytic spurring. Other neck: No acute soft tissue abnormality within the neck. Salivary glands within normal limits. Thyroid normal. No adenopathy. Upper chest: Median sternotomy wires partially visualized. No acute abnormality within the visualized upper chest. Emphysema. Review of the  MIP images confirms the above findings CTA HEAD FINDINGS Anterior circulation: The right ICA occluded to the level of the terminus. Multifocal plaque within the petrous left ICA with mild narrowing. Advanced atheromatous plaque throughout the cavernous/ supraclinoid left ICA with resultant moderate multifocal irregularity and narrowing. Left ICA terminus widely patent. A1 segments widely patent without stenosis. Patent anterior communicating artery. Anterior cerebral artery is widely patent to their distal aspects without flow-limiting stenosis. Perfusion of the right ICA terminus via flow cross the circle of Willis. Right M1 segment patent without stenosis. Left M1 segment patent as well. Normal MCA bifurcations. No proximal M2 occlusion. Distal MCA branches well perfused and symmetric. Posterior circulation: Left vertebral artery dominant. Multifocal atheromatous plaque throughout the left V4 segment with moderate multifocal narrowing. Left PICA patent. Hypoplastic right vertebral artery occluded at the cranial vault. Right PICA not visualized. Extensive multifocal atheromatous regularity throughout the basilar artery with moderate to severe stenoses, most notable at its distal aspect at the level of the basilar tip (series 8, image 97). Superior cerebral arteries patent bilaterally. Left PCA supplied via the basilar artery. Right  PCA supplied via a hypoplastic right P1 segment as well as a right posterior communicating artery. 3 mm ovoid blush of contrast at the confluence of the right P1 segment and right P com suspicious for possible small aneurysm (series 8, image 97). Extensive atheromatous irregularity throughout the P2 segments bilaterally the without high-grade stenosis. Distal PCAs not well visualized, and may be occluded given the chronic bilateral PCA territory infarcts. Venous sinuses: Centrally positioned and linear filling defect involving the posterior aspect of the superior sagittal sinus is not  seen on 5 minutes delayed sequence, and is favored to reflect a flow related artifact. No evidence for dural sinus thrombosis. Anatomic variants: None significant. Delayed phase: No abnormal enhancement. Review of the MIP images confirms the above findings IMPRESSION: 1. Occlusion of the right common carotid artery at its mid aspect, age indeterminate, and may be chronic given at this would not fit with patient's current symptoms. Right carotid artery system is occluded to the level of the circle of Willis. Distal reconstitution at the level of the right ICA terminus with fairly robust flow throughout the right MCA territory. 2. Extensive atheromatous irregularity throughout the left ICA and MCA without large vessel occlusion. Moderate carotid siphon atherosclerosis without high-grade or correctable stenosis. 3. Extensive atheromatous irregularity throughout the vertebrobasilar system, with moderate to severe multifocal stenoses involving the basilar artery. 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 Distal PCA branches not well visualized, suspected to be occluded given the chronic bilateral PCA territory infarcts. 4. 3 mm focal outpouching arising at the confluence of the right P1 segment and posterior communicating artery, suspicious for small aneurysm. Critical Value/emergent results were called by telephone at the time of interpretation on 06/25/2017 at 7:47 pm to Dr. Ferd GlassingALBERTO FIGUEROA-GARCIA , who verbally acknowledged these results. Electronically Signed   By: Rise MuBenjamin  McClintock M.D.   On: 06/25/2017 20:00   Ct Cervical Spine Wo Contrast  Result Date: 06/25/2017 CLINICAL DATA:  Acute onset of slurred speech and right-sided facial droop. Last seen normal 9 hours ago. EXAM: CT HEAD WITHOUT CONTRAST CT CERVICAL SPINE WITHOUT CONTRAST TECHNIQUE: Multidetector CT imaging of the head and cervical spine was performed following the standard protocol without intravenous contrast.  Multiplanar CT image reconstructions of the cervical spine were also generated. COMPARISON:  CT head without contrast 02/06/2016. FINDINGS: CT HEAD FINDINGS Brain: Remote encephalomalacia of the left occipital lobe known bilateral parietal lobes is stable. Remote lacunar infarcts of the basal ganglia are stable bilaterally. Remote lacunar infarct is again seen in the right thalamus. No acute cortical infarct, hemorrhage, or mass lesion is present. The insular ribbon is intact bilaterally. The basal ganglia are stable. The brainstem and cerebellum are within normal limits. Vascular: Dense calcifications are present within the cavernous internal carotid arteries in the left vertebral artery. There is no hyperdense vessel. Skull: Calvarium is intact. No focal lytic or blastic lesions are present. Sinuses/Orbits: Remote left orbital blowout fracture is stable. The paranasal sinuses and mastoid air cells are otherwise within normal limits. Globes and orbits are otherwise normal. CT CERVICAL SPINE FINDINGS Alignment: Grade 1 anterolisthesis at C2-3 is degenerative. AP alignment is otherwise anatomic. Skull base and vertebrae: The craniocervical junction is normal. Marked degenerative changes are again seen at C1-2. Vertebral body heights are normal. No acute or healing fracture is present. Soft tissues and spinal canal: Vascular calcifications are present bilaterally, most prominent at the right carotid bifurcation. There are calcifications along the left vertebral artery. These stable. Disc levels:  Central canal stenosis is most severe at C3-4 with prominent posterior calcifications narrow the canal. Foraminal narrowing is also greatest at C3-4 without significant change. There is left-sided foraminal disease at C5-6 and C6-7 similar the prior study as well. Upper chest: The lung apices are clear. IMPRESSION: 1. No acute intracranial abnormality or significant interval change. 2. Aspects 10/10 3. Stable remote infarct  involving the left occipital lobe and bilateral parietal lobes. 4. Stable remote lacunar infarcts of the basal ganglia. 5. Extensive vascular calcifications. 6. Stable multilevel spondylosis cervical spine without acute abnormality. 7.  Aortic Atherosclerosis (ICD10-I70.0). These results were called by telephone at the time of interpretation on 06/25/2017 at 6:10 pm to Dr. Bethann Berkshire , who verbally acknowledged these results. Electronically Signed   By: Marin Roberts M.D.   On: 06/25/2017 18:10   Ct Head Code Stroke Wo Contrast  Result Date: 06/25/2017 CLINICAL DATA:  Acute onset of slurred speech and right-sided facial droop. Last seen normal 9 hours ago. EXAM: CT HEAD WITHOUT CONTRAST CT CERVICAL SPINE WITHOUT CONTRAST TECHNIQUE: Multidetector CT imaging of the head and cervical spine was performed following the standard protocol without intravenous contrast. Multiplanar CT image reconstructions of the cervical spine were also generated. COMPARISON:  CT head without contrast 02/06/2016. FINDINGS: CT HEAD FINDINGS Brain: Remote encephalomalacia of the left occipital lobe known bilateral parietal lobes is stable. Remote lacunar infarcts of the basal ganglia are stable bilaterally. Remote lacunar infarct is again seen in the right thalamus. No acute cortical infarct, hemorrhage, or mass lesion is present. The insular ribbon is intact bilaterally. The basal ganglia are stable. The brainstem and cerebellum are within normal limits. Vascular: Dense calcifications are present within the cavernous internal carotid arteries in the left vertebral artery. There is no hyperdense vessel. Skull: Calvarium is intact. No focal lytic or blastic lesions are present. Sinuses/Orbits: Remote left orbital blowout fracture is stable. The paranasal sinuses and mastoid air cells are otherwise within normal limits. Globes and orbits are otherwise normal. CT CERVICAL SPINE FINDINGS Alignment: Grade 1 anterolisthesis at C2-3  is degenerative. AP alignment is otherwise anatomic. Skull base and vertebrae: The craniocervical junction is normal. Marked degenerative changes are again seen at C1-2. Vertebral body heights are normal. No acute or healing fracture is present. Soft tissues and spinal canal: Vascular calcifications are present bilaterally, most prominent at the right carotid bifurcation. There are calcifications along the left vertebral artery. These stable. Disc levels: Central canal stenosis is most severe at C3-4 with prominent posterior calcifications narrow the canal. Foraminal narrowing is also greatest at C3-4 without significant change. There is left-sided foraminal disease at C5-6 and C6-7 similar the prior study as well. Upper chest: The lung apices are clear. IMPRESSION: 1. No acute intracranial abnormality or significant interval change. 2. Aspects 10/10 3. 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 4. Stable remote lacunar infarcts of the basal ganglia. 5. Extensive vascular calcifications. 6. Stable multilevel spondylosis cervical spine without acute abnormality. 7.  Aortic Atherosclerosis (ICD10-I70.0). These results were called by telephone at the time of interpretation on 06/25/2017 at 6:10 pm to Dr. Bethann Berkshire , who verbally acknowledged these results. Electronically Signed   By: Marin Roberts M.D.   On: 06/25/2017 18:10    Procedures Procedures (including critical care time)  Medications Ordered in ED Medications  iopamidol (ISOVUE-370) 76 % injection (not administered)  iopamidol (ISOVUE-370) 76 % injection 75 mL (75 mLs Intravenous Contrast Given 06/25/17 1837)  Initial Impression / Assessment and Plan / ED Course  I have reviewed the triage vital signs and the nursing notes.  Pertinent labs & imaging results that were available during my care of the patient were reviewed by me and considered in my medical decision making (see chart for  details).     CRITICAL CARE Performed by: Bethann Berkshire Total critical care time: 40 minutes Critical care time was exclusive of separately billable procedures and treating other patients. Critical care was necessary to treat or prevent imminent or life-threatening deterioration. Critical care was time spent personally by me on the following activities: development of treatment plan with patient and/or surrogate as well as nursing, discussions with consultants, evaluation of patient's response to treatment, examination of patient, obtaining history from patient or surrogate, ordering and performing treatments and interventions, ordering and review of laboratory studies, ordering and review of radiographic studies, pulse oximetry and re-evaluation of patient's condition.  Patient continues to say she is suicidal.  Patient was evaluated by behavioral health.  It was felt that the patient should stay in the hospital overnight and get a second evaluation in the morning.  The patient was committed to stay here. Final Clinical Impressions(s) / ED Diagnoses   Final diagnoses:  None    ED Discharge Orders    None       Bethann Berkshire, MD 06/25/17 2317

## 2017-06-25 NOTE — ED Provider Notes (Signed)
[facility]-EMERGENCY DEPT Provider Note   CSN: [id number] Arrival date & time: 06/25/17  1730     History   Chief Complaint Chief Complaint  Patient presents with  . Facial Droop  . Aphasia    HPI PHILIP KOTLYAR is a 76 y.o. male.  Was last seen normal at 9 AM this morning.  When his son came home he noticed the patient was weak in the left side had left facial drooping and some slurred speech.   The history is provided by the patient and a relative. No language interpreter was used.  Weakness  Primary symptoms include focal weakness. This is a new problem. The current episode started 6 to 12 hours ago. The problem has not changed since onset.There was left facial focality noted. There has been no fever. Pertinent negatives include no shortness of breath, no chest pain and no headaches. There were no medications administered prior to arrival. Associated medical issues do not include trauma.    Past Medical History:  Diagnosis Date  . Atrial fibrillation (HCC)    perioperative  . CAD (coronary artery disease)   . Carotid stenosis    bilateral  . COPD (chronic obstructive pulmonary disease) (HCC)   . Critical lower limb ischemia   . Diabetes mellitus   . DM (diabetes mellitus) (HCC) 01/15/2012  . Dyslipidemia   . H/O ascending aorta repair   . Hematuria 01/15/2012  . Hypertension   . Ischemic cardiomyopathy   . Left ventricular apical thrombus following MI, NSTEMI, 01/13/12 01/15/2012  . LV (left ventricular) mural thrombus    inferapical  . S/P CABG x 3 01/22/2012   LIMA to LAD,SVG to second diagonal,SVG to distal RCA  . Staghorn renal calculus 01/15/2012  . Stroke (HCC)   . Thrombocytopenia [facility])     Patient Active Problem List   Diagnosis Date Noted  . Right hemiparesis (HCC) 06/25/2017  . Dysarthria 06/25/2017  . Dysphagia 06/25/2017  . Acute CVA (cerebrovascular accident) (HCC) 06/25/2017  . Long term (current) use of anticoagulants  06/25/2017  . Mixed hyperlipidemia 06/15/2013  . CKD (chronic kidney disease) stage 3, GFR 30-59 ml/min (HCC) 03/02/2013  . Critical lower limb ischemia 02/10/2013  . Preop cardiovascular exam 12/18/2012  . Long term current use of anticoagulant therapy 09/12/2012  . Occlusion and stenosis of carotid artery without mention of cerebral infarction 02/13/2012  . S/P CABG x 3     01/23/12 01/24/2012  . S/P carotid endarterectomy   Lt, 01/23/12 01/24/2012  . Carotid arterial disease, severe LICA stenosis 01/18/2012  . History of stroke 01/18/2012  . CAD (coronary artery disease) status post CABG July 2013 01/17/2012  . COPD (chronic obstructive pulmonary disease) (HCC) 01/17/2012  . Acute renal insufficiency, SCr up to 1.43 01/17/2012  . Ischemic cardiomyopathy, EF 40-45% 2D 01/15/12 01/17/2012  . Staghorn renal calculus 01/15/2012  . Left ventricular apical thrombus following MI, NSTEMI, 01/13/12 01/15/2012  . DM (diabetes mellitus) (HCC) 01/15/2012  . NSTEMI (non-ST elevated myocardial infarction) (HCC) 01/14/2012  . HTN (hypertension) 01/14/2012  . Thrombocytopenia (HCC) 01/14/2012  . Tobacco abuse 01/14/2012  . H/O ascending aorta repair     Past Surgical History:  Procedure Laterality Date  . ABDOMINAL ANGIOGRAM  03/03/2013   Procedure: ABDOMINAL ANGIOGRAM;  Surgeon: Runell Gess, MD;  Location: [facility] CATH LAB;  Service: Cardiovascular;;  . CAROTID ENDARTERECTOMY  01/13/2012  . CORONARY ANGIOGRAM  01/16/2012   Procedure: CORONARY ANGIOGRAM;  Surgeon: Thurmon Fair, MD;  Location: [facility] CATH LAB;  Service: Cardiovascular;;  . CORONARY ARTERY BYPASS GRAFT  01/22/2012   Procedure: CORONARY ARTERY BYPASS GRAFTING (CABG);  Surgeon: Kerin Perna, MD;  Location: [facility] OR;  Service: Open Heart Surgery;  Laterality: N/A;  CABG x three using left internal mammary artery and right leg greater saphenous vein harvested endoscopically  . ENDARTERECTOMY  01/22/2012   Procedure: ENDARTERECTOMY CAROTID;   Surgeon: Larina Earthly, MD;  Location: [facility] OR;  Service: Vascular;  Laterality: Left;  . LOWER EXTREMITY ANGIOGRAM Left 03/03/2013   Procedure: LOWER EXTREMITY ANGIOGRAM;  Surgeon: Runell Gess, MD;  Location: [facility] CATH LAB;  Service: Cardiovascular;  Laterality: Left;  . NM MYOVIEW LTD  02/20/2012   infarct/scar w/mild perinfarct ischemia, EF 38%, abnormal nuc, no sign. ischemia  . REPAIR THORACIC AORTA     Ascending thoracic aorto repair       Home Medications    Prior to Admission medications   Medication Sig Start Date End Date Taking? Authorizing Provider  acetaminophen (TYLENOL) 500 MG tablet Take 1,000 mg by mouth every 6 (six) hours as needed for pain.    Yes [provider]  aspirin EC 81 MG tablet Take 81 mg by mouth daily.   Yes [provider]  albuterol (PROVENTIL HFA;VENTOLIN HFA) 108 (90 BASE) MCG/ACT inhaler Inhale 2 puffs into the lungs every 6 (six) hours as needed for wheezing. Patient not taking: Reported on 06/11/2017 06/25/13   Runell Gess, MD  atorvastatin (LIPITOR) 20 MG tablet TAKE ONE TABLET BY MOUTH ONCE DAILY Patient not taking: Reported on 06/11/2017 08/20/15   Croitoru, Rachelle Hora, MD  ipratropium (ATROVENT HFA) 17 MCG/ACT inhaler Inhale 2 puffs into the lungs every 6 (six) hours as needed for wheezing. Patient not taking: Reported on 06/11/2017 07/21/14   Croitoru, Mihai, MD  lisinopril (PRINIVIL,ZESTRIL) 5 MG tablet TAKE ONE TABLET BY MOUTH ONCE DAILY Patient not taking: Reported on 06/11/2017 08/20/15   Croitoru, Rachelle Hora, MD  NITROSTAT 0.4 MG SL tablet DISSOLVE ONE TABLET UNDER THE TONGUE EVERY 5 MINUTES AS NEEDED FOR CHEST PAIN.  DO NOT EXCEED A TOTAL OF 3 DOSES IN 15 MINUTES NOW Patient not taking: Reported on 06/11/2017 06/24/15   Croitoru, Mihai, MD  traMADol (ULTRAM) 50 MG tablet TAKE ONE TABLET BY MOUTH EVERY 6 HOURS AS NEEDED FOR MODERATE PAIN Patient not taking: Reported on 02/06/2016 08/20/15   Croitoru, Mihai, MD  warfarin (COUMADIN) 5  MG tablet TAKE ONE TABLET BY MOUTH ONCE DAILY OR AS  DIRECTED  BY  COUMADIN  CLINIC Patient not taking: Reported on 06/11/2017 05/19/15   Dwana Melena, PA-C    Family History Family History  Problem Relation Age of Onset  . Heart disease Father   . Heart attack Brother     Social History Social History   Tobacco Use  . Smoking status: Former Smoker    Packs/day: 1.00    Types: Cigarettes    Last attempt to quit: 01/13/2012    Years since quitting: 5.4  . Smokeless tobacco: Never Used  Substance Use Topics  . Alcohol use: No  . Drug use: No     Allergies   Patient has no known allergies.   Review of Systems Review of Systems  Constitutional: Negative for appetite change and fatigue.  HENT: Negative for congestion, ear discharge and sinus pressure.        Slurred speech  Eyes: Negative for discharge.  Respiratory: Negative for cough and shortness of breath.  Cardiovascular: Negative for chest pain.  Gastrointestinal: Negative for abdominal pain and diarrhea.  Genitourinary: Negative for frequency and hematuria.  Musculoskeletal: Negative for back pain.       Weakness left arm and leg  Skin: Negative for rash.  Neurological: Positive for focal weakness and weakness. Negative for seizures and headaches.  Psychiatric/Behavioral: Negative for hallucinations.     Physical Exam Updated Vital Signs BP (!) 161/69 (BP Location: Left Arm)   Pulse 68   Temp 97.7 F (36.5 C) (Oral)   Resp 20   SpO2 97%   Physical Exam  Constitutional: He is oriented to person, place, and time. He appears well-developed.  HENT:  Head: Normocephalic.  Patient has significantly slurred speech  Eyes: Conjunctivae and EOM are normal. No scleral icterus.  Neck: Neck supple. No thyromegaly present.  Cardiovascular: Normal rate and regular rhythm. Exam reveals no gallop and no friction rub.  No murmur heard. Pulmonary/Chest: No stridor. He has no wheezes. He has no rales. He exhibits no  tenderness.  Abdominal: He exhibits no distension. There is no tenderness. There is no rebound.  Musculoskeletal: Normal range of motion. He exhibits no edema.  Mild weakness in left arm and left leg.  Lymphadenopathy:    He has no cervical adenopathy.  Neurological: He is oriented to person, place, and time. He exhibits normal muscle tone. Coordination normal.  Skin: No rash noted. No erythema.  Psychiatric: He has a normal mood and affect. His behavior is normal.     ED Treatments / Results  Labs (all labs ordered are listed, but only abnormal results are displayed) Labs Reviewed  COMPREHENSIVE METABOLIC PANEL - Abnormal; Notable for the following components:      Result Value   Glucose, Bld 113 (*)    ALT 11 (*)    All other components within normal limits  URINALYSIS, ROUTINE W REFLEX MICROSCOPIC - Abnormal; Notable for the following components:   APPearance HAZY (*)    Hgb urine dipstick SMALL (*)    Squamous Epithelial / LPF 0-5 (*)    All other components within normal limits  I-STAT CHEM 8, ED - Abnormal; Notable for the following components:   Glucose, Bld 112 (*)    Calcium, Ion 1.11 (*)    All other components within normal limits  CBG MONITORING, ED - Abnormal; Notable for the following components:   Glucose-Capillary 111 (*)    All other components within normal limits  ETHANOL  PROTIME-INR  APTT  CBC  DIFFERENTIAL  RAPID URINE DRUG SCREEN, HOSP PERFORMED  I-STAT TROPONIN, ED    EKG  EKG Interpretation None       Radiology Ct Angio Head W Or Wo Contrast  Result Date: 06/25/2017 CLINICAL DATA:  Initial evaluation for follow-up stroke. EXAM: CT ANGIOGRAPHY HEAD AND NECK TECHNIQUE: Multidetector CT imaging of the head and neck was performed using the standard protocol during bolus administration of intravenous contrast. Multiplanar CT image reconstructions and MIPs were obtained to evaluate the vascular anatomy. Carotid stenosis measurements (when  applicable) are obtained utilizing NASCET criteria, using the distal internal carotid diameter as the denominator. CONTRAST:  75mL ISOVUE-370 IOPAMIDOL (ISOVUE-370) INJECTION 76% COMPARISON:  Prior head CT from earlier the same day. FINDINGS: CTA NECK FINDINGS Aortic arch: Extensive atheromatous plaque throughout the visualized aortic arch. No high-grade stenosis about the origin of the great vessels. Extensive atheromatous regularity throughout the subclavian artery is without high-grade stenosis. Right carotid system: Right common carotid artery is occluded at its  mid aspect to the level of the bifurcation. Right ICA entirely included within the neck to the level of the terminus. Opacification of the right external carotid artery and its branches via collateral flow. Left carotid system: Left common carotid artery demonstrates multifocal atheromatous irregularity but is patent from its origin to the bifurcation without flow-limiting stenosis. No significant atheromatous narrowing about the left bifurcation. Left ICA patent within the neck without flow-limiting stenosis. Vertebral arteries: Both of the vertebral artery is arise from the subclavian artery is. Left vertebral artery dominant. Right vertebral artery diffusely hypoplastic. Atheromatous plaque at the origin of the vertebral arteries bilaterally with moderate to severe stenoses. Extensive multifocal atheromatous irregularity throughout the vertebral artery is with resultant mild to moderate multifocal stenoses. Right vertebral artery occludes at the cranial vault. Skeleton: No acute osseus abnormality. No worrisome lytic or blastic osseous lesions. Advanced degenerative spondylolysis throughout the cervical spine with bulky anterior osteophytic spurring. Other neck: No acute soft tissue abnormality within the neck. Salivary glands within normal limits. Thyroid normal. No adenopathy. Upper chest: Median sternotomy wires partially visualized. No acute  abnormality within the visualized upper chest. Emphysema. Review of the MIP images confirms the above findings CTA HEAD FINDINGS Anterior circulation: The right ICA occluded to the level of the terminus. Multifocal plaque within the petrous left ICA with mild narrowing. Advanced atheromatous plaque throughout the cavernous/ supraclinoid left ICA with resultant moderate multifocal irregularity and narrowing. Left ICA terminus widely patent. A1 segments widely patent without stenosis. Patent anterior communicating artery. Anterior cerebral artery is widely patent to their distal aspects without flow-limiting stenosis. Perfusion of the right ICA terminus via flow cross the circle of Willis. Right M1 segment patent without stenosis. Left M1 segment patent as well. Normal MCA bifurcations. No proximal M2 occlusion. Distal MCA branches well perfused and symmetric. Posterior circulation: Left vertebral artery dominant. Multifocal atheromatous plaque throughout the left V4 segment with moderate multifocal narrowing. Left PICA patent. Hypoplastic right vertebral artery occluded at the cranial vault. Right PICA not visualized. Extensive multifocal atheromatous regularity throughout the basilar artery with moderate to severe stenoses, most notable at its distal aspect at the level of the basilar tip (series 8, image 97). Superior cerebral arteries patent bilaterally. Left PCA supplied via the basilar artery. Right PCA supplied via a hypoplastic right P1 segment as well as a right posterior communicating artery. 3 mm ovoid blush of contrast at the confluence of the right P1 segment and right P com suspicious for possible small aneurysm (series 8, image 97). Extensive atheromatous irregularity throughout the P2 segments bilaterally the without high-grade stenosis. Distal PCAs not well visualized, and may be occluded given the chronic bilateral PCA territory infarcts. Venous sinuses: Centrally positioned and linear filling defect  involving the posterior aspect of the superior sagittal sinus is not seen on 5 minutes delayed sequence, and is favored to reflect a flow related artifact. No evidence for dural sinus thrombosis. Anatomic variants: None significant. Delayed phase: No abnormal enhancement. Review of the MIP images confirms the above findings IMPRESSION: 1. Occlusion of the right common carotid artery at its mid aspect, age indeterminate, and may be chronic given at this would not fit with patient's current symptoms. Right carotid artery system is occluded to the level of the circle of Willis. Distal reconstitution at the level of the right ICA terminus with fairly robust flow throughout the right MCA territory. 2. Extensive atheromatous irregularity throughout the left ICA and MCA without large vessel occlusion. Moderate carotid siphon atherosclerosis  without high-grade or correctable stenosis. 3. Extensive atheromatous irregularity throughout the vertebrobasilar system, with moderate to severe multifocal stenoses involving the basilar artery. 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 Distal PCA branches not well visualized, suspected to be occluded given the chronic bilateral PCA territory infarcts. 4. 3 mm focal outpouching arising at the confluence of the right P1 segment and posterior communicating artery, suspicious for small aneurysm. Critical Value/emergent results were called by telephone at the time of interpretation on 06/25/2017 at 7:47 pm to Dr. Ferd Glassing , who verbally acknowledged these results. Electronically Signed   By: Rise Mu M.D.   On: 06/25/2017 20:00   Ct Angio Neck W Or Wo Contrast  Result Date: 06/25/2017 CLINICAL DATA:  Initial evaluation for follow-up stroke. EXAM: CT ANGIOGRAPHY HEAD AND NECK TECHNIQUE: Multidetector CT imaging of the head and neck was performed using the standard protocol during bolus administration of intravenous contrast.  Multiplanar CT image reconstructions and MIPs were obtained to evaluate the vascular anatomy. Carotid stenosis measurements (when applicable) are obtained utilizing NASCET criteria, using the distal internal carotid diameter as the denominator. CONTRAST:  75mL ISOVUE-370 IOPAMIDOL (ISOVUE-370) INJECTION 76% COMPARISON:  Prior head CT from earlier the same day. FINDINGS: CTA NECK FINDINGS Aortic arch: Extensive atheromatous plaque throughout the visualized aortic arch. No high-grade stenosis about the origin of the great vessels. Extensive atheromatous regularity throughout the subclavian artery is without high-grade stenosis. Right carotid system: Right common carotid artery is occluded at its mid aspect to the level of the bifurcation. Right ICA entirely included within the neck to the level of the terminus. Opacification of the right external carotid artery and its branches via collateral flow. Left carotid system: Left common carotid artery demonstrates multifocal atheromatous irregularity but is patent from its origin to the bifurcation without flow-limiting stenosis. No significant atheromatous narrowing about the left bifurcation. Left ICA patent within the neck without flow-limiting stenosis. Vertebral arteries: Both of the vertebral artery is arise from the subclavian artery is. Left vertebral artery dominant. Right vertebral artery diffusely hypoplastic. Atheromatous plaque at the origin of the vertebral arteries bilaterally with moderate to severe stenoses. Extensive multifocal atheromatous irregularity throughout the vertebral artery is with resultant mild to moderate multifocal stenoses. Right vertebral artery occludes at the cranial vault. Skeleton: No acute osseus abnormality. No worrisome lytic or blastic osseous lesions. Advanced degenerative spondylolysis throughout the cervical spine with bulky anterior osteophytic spurring. Other neck: No acute soft tissue abnormality within the neck. Salivary  glands within normal limits. Thyroid normal. No adenopathy. Upper chest: Median sternotomy wires partially visualized. No acute abnormality within the visualized upper chest. Emphysema. Review of the MIP images confirms the above findings CTA HEAD FINDINGS Anterior circulation: The right ICA occluded to the level of the terminus. Multifocal plaque within the petrous left ICA with mild narrowing. Advanced atheromatous plaque throughout the cavernous/ supraclinoid left ICA with resultant moderate multifocal irregularity and narrowing. Left ICA terminus widely patent. A1 segments widely patent without stenosis. Patent anterior communicating artery. Anterior cerebral artery is widely patent to their distal aspects without flow-limiting stenosis. Perfusion of the right ICA terminus via flow cross the circle of Willis. Right M1 segment patent without stenosis. Left M1 segment patent as well. Normal MCA bifurcations. No proximal M2 occlusion. Distal MCA branches well perfused and symmetric. Posterior circulation: Left vertebral artery dominant. Multifocal atheromatous plaque throughout the left V4 segment with moderate multifocal narrowing. Left PICA patent. Hypoplastic right vertebral artery occluded at the cranial vault.  Right PICA not visualized. Extensive multifocal atheromatous regularity throughout the basilar artery with moderate to severe stenoses, most notable at its distal aspect at the level of the basilar tip (series 8, image 97). Superior cerebral arteries patent bilaterally. Left PCA supplied via the basilar artery. Right PCA supplied via a hypoplastic right P1 segment as well as a right posterior communicating artery. 3 mm ovoid blush of contrast at the confluence of the right P1 segment and right P com suspicious for possible small aneurysm (series 8, image 97). Extensive atheromatous irregularity throughout the P2 segments bilaterally the without high-grade stenosis. Distal PCAs not well visualized, and  may be occluded given the chronic bilateral PCA territory infarcts. Venous sinuses: Centrally positioned and linear filling defect involving the posterior aspect of the superior sagittal sinus is not seen on 5 minutes delayed sequence, and is favored to reflect a flow related artifact. No evidence for dural sinus thrombosis. Anatomic variants: None significant. Delayed phase: No abnormal enhancement. Review of the MIP images confirms the above findings IMPRESSION: 1. Occlusion of the right common carotid artery at its mid aspect, age indeterminate, and may be chronic given at this would not fit with patient's current symptoms. Right carotid artery system is occluded to the level of the circle of Willis. Distal reconstitution at the level of the right ICA terminus with fairly robust flow throughout the right MCA territory. 2. Extensive atheromatous irregularity throughout the left ICA and MCA without large vessel occlusion. Moderate carotid siphon atherosclerosis without high-grade or correctable stenosis. 3. Extensive atheromatous irregularity throughout the vertebrobasilar system, with moderate to severe multifocal stenoses involving the basilar artery. 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 Distal PCA branches not well visualized, suspected to be occluded given the chronic bilateral PCA territory infarcts. 4. 3 mm focal outpouching arising at the confluence of the right P1 segment and posterior communicating artery, suspicious for small aneurysm. Critical Value/emergent results were called by telephone at the time of interpretation on 06/25/2017 at 7:47 pm to Dr. Ferd Glassing , who verbally acknowledged these results. Electronically Signed   By: Rise Mu M.D.   On: 06/25/2017 20:00   Ct Cervical Spine Wo Contrast  Result Date: 06/25/2017 CLINICAL DATA:  Acute onset of slurred speech and right-sided facial droop. Last seen normal 9 hours ago. EXAM: CT HEAD  WITHOUT CONTRAST CT CERVICAL SPINE WITHOUT CONTRAST TECHNIQUE: Multidetector CT imaging of the head and cervical spine was performed following the standard protocol without intravenous contrast. Multiplanar CT image reconstructions of the cervical spine were also generated. COMPARISON:  CT head without contrast 02/06/2016. FINDINGS: CT HEAD FINDINGS Brain: Remote encephalomalacia of the left occipital lobe known bilateral parietal lobes is stable. Remote lacunar infarcts of the basal ganglia are stable bilaterally. Remote lacunar infarct is again seen in the right thalamus. No acute cortical infarct, hemorrhage, or mass lesion is present. The insular ribbon is intact bilaterally. The basal ganglia are stable. The brainstem and cerebellum are within normal limits. Vascular: Dense calcifications are present within the cavernous internal carotid arteries in the left vertebral artery. There is no hyperdense vessel. Skull: Calvarium is intact. No focal lytic or blastic lesions are present. Sinuses/Orbits: Remote left orbital blowout fracture is stable. The paranasal sinuses and mastoid air cells are otherwise within normal limits. Globes and orbits are otherwise normal. CT CERVICAL SPINE FINDINGS Alignment: Grade 1 anterolisthesis at C2-3 is degenerative. AP alignment is otherwise anatomic. Skull base and vertebrae: The craniocervical junction is normal. Marked degenerative  changes are again seen at C1-2. Vertebral body heights are normal. No acute or healing fracture is present. Soft tissues and spinal canal: Vascular calcifications are present bilaterally, most prominent at the right carotid bifurcation. There are calcifications along the left vertebral artery. These stable. Disc levels: Central canal stenosis is most severe at C3-4 with prominent posterior calcifications narrow the canal. Foraminal narrowing is also greatest at C3-4 without significant change. There is left-sided foraminal disease at C5-6 and C6-7  similar the prior study as well. Upper chest: The lung apices are clear. IMPRESSION: 1. No acute intracranial abnormality or significant interval change. 2. Aspects 10/10 3. 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 4. Stable remote lacunar infarcts of the basal ganglia. 5. Extensive vascular calcifications. 6. Stable multilevel spondylosis cervical spine without acute abnormality. 7.  Aortic Atherosclerosis (ICD10-I70.0). These results were called by telephone at the time of interpretation on 06/25/2017 at 6:10 pm to Dr. Bethann Berkshire , who verbally acknowledged these results. Electronically Signed   By: Marin Roberts M.D.   On: 06/25/2017 18:10   Ct Head Code Stroke Wo Contrast  Result Date: 06/25/2017 CLINICAL DATA:  Acute onset of slurred speech and right-sided facial droop. Last seen normal 9 hours ago. EXAM: CT HEAD WITHOUT CONTRAST CT CERVICAL SPINE WITHOUT CONTRAST TECHNIQUE: Multidetector CT imaging of the head and cervical spine was performed following the standard protocol without intravenous contrast. Multiplanar CT image reconstructions of the cervical spine were also generated. COMPARISON:  CT head without contrast 02/06/2016. FINDINGS: CT HEAD FINDINGS Brain: Remote encephalomalacia of the left occipital lobe known bilateral parietal lobes is stable. Remote lacunar infarcts of the basal ganglia are stable bilaterally. Remote lacunar infarct is again seen in the right thalamus. No acute cortical infarct, hemorrhage, or mass lesion is present. The insular ribbon is intact bilaterally. The basal ganglia are stable. The brainstem and cerebellum are within normal limits. Vascular: Dense calcifications are present within the cavernous internal carotid arteries in the left vertebral artery. There is no hyperdense vessel. Skull: Calvarium is intact. No focal lytic or blastic lesions are present. Sinuses/Orbits: Remote left orbital blowout fracture is  stable. The paranasal sinuses and mastoid air cells are otherwise within normal limits. Globes and orbits are otherwise normal. CT CERVICAL SPINE FINDINGS Alignment: Grade 1 anterolisthesis at C2-3 is degenerative. AP alignment is otherwise anatomic. Skull base and vertebrae: The craniocervical junction is normal. Marked degenerative changes are again seen at C1-2. Vertebral body heights are normal. No acute or healing fracture is present. Soft tissues and spinal canal: Vascular calcifications are present bilaterally, most prominent at the right carotid bifurcation. There are calcifications along the left vertebral artery. These stable. Disc levels: Central canal stenosis is most severe at C3-4 with prominent posterior calcifications narrow the canal. Foraminal narrowing is also greatest at C3-4 without significant change. There is left-sided foraminal disease at C5-6 and C6-7 similar the prior study as well. Upper chest: The lung apices are clear. IMPRESSION: 1. No acute intracranial abnormality or significant interval change. 2. Aspects 10/10 3. 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 4. Stable remote lacunar infarcts of the basal ganglia. 5. Extensive vascular calcifications. 6. Stable multilevel spondylosis cervical spine without acute abnormality. 7.  Aortic Atherosclerosis (ICD10-I70.0). These results were called by telephone at the time of interpretation on 06/25/2017 at 6:10 pm to Dr. Bethann Berkshire , who verbally acknowledged these results. Electronically Signed   By: Cristal Deer  Mattern M.D.   On: 06/25/2017 18:10    Procedures Procedures (including critical care time)  Medications Ordered in ED Medications  iopamidol (ISOVUE-370) 76 % injection (not administered)  iopamidol (ISOVUE-370) 76 % injection 75 mL (75 mLs Intravenous Contrast Given 06/25/17 1837)     Initial Impression / Assessment and Plan / ED Course  I have reviewed the triage vital  signs and the nursing notes.  Pertinent labs & imaging results that were available during my care of the patient were reviewed by me and considered in my medical decision making (see chart for details).    CRITICAL CARE Performed by: Bethann Berkshire Total critical care time:40 minutes Critical care time was exclusive of separately billable procedures and treating other patients. Critical care was necessary to treat or prevent imminent or life-threatening deterioration. Critical care was time spent personally by me on the following activities: development of treatment plan with patient and/or surrogate as well as nursing, discussions with consultants, evaluation of patient's response to treatment, examination of patient, obtaining history from patient or surrogate, ordering and performing treatments and interventions, ordering and review of laboratory studies, ordering and review of radiographic studies, pulse oximetry and re-evaluation of patient's condition.  Patient was seen by telemetry neurologist.  And it was decided after the CT angios that the patient does not have a large vessel stroke.  He will be admitted to medicine with neurology consult for CVA   Final Clinical Impressions(s) / ED Diagnoses   Final diagnoses:  None    ED Discharge Orders    None       Bethann Berkshire, MD 06/25/17 2320

## 2017-06-25 NOTE — H&P (Signed)
[facility] History and Physical  DANIAL RUEFF LYH:[id number] DOB: 04-15-1941 DOA: 06/25/2017  Referring physician: Dr Estell Harpin PCP: Default, Provider, MD   Chief Complaint: L arm / leg weak, slurred speech  HPI: Charles Hatfield is a 76 y.o. male with hx of severe PAD, prior CVA, CAD sp CABG, L CEA, HTN, prior aortobifemoral bypass presented to ED about 5:30 w/ son who reported patient w/ new onset slurred speech and R facial droop x 1.5 hours.  Was seen by tele neurology who felt pt had L hemispheric CVA pure motor w/ R field cut, suspected the field cut was old however. Was not a TPA candidate.  Asked to see for admission.   Pt poor historian, family has left.  Can't understand patient very well at all. Mumbles responses. Denies any current CP or SOB, no abd pain, n/v/d.  He failed the swallow test done by ED nurse.     Chart: -aug 2013 > 18 yo w/ hx of CAD, prior CVA and PAD admitted w/ severe CP, ^trop in ED, NSTEMI.  Heart cath showed severe 3V CAD and doppler carotids showed severe R disease. Pt u nderwent combined L CEA and CABG. Recovered well and was dc'd to SNF.  -sept 2014 > painful L foot pain and ischemic ulcer of L great toe.  Had arteriogram which showed left limb of prior aortobifem  was occluded.  Bypass was discussed and plan was to f/u with vasc surgeon in OP setting.  Resumed on coumadin at dc.    ROS  denies CP  no nausea/ vomiting    Past Medical History  Past Medical History:  Diagnosis Date  . Atrial fibrillation (HCC)    perioperative  . CAD (coronary artery disease)   . Carotid stenosis    bilateral  . COPD (chronic obstructive pulmonary disease) (HCC)   . Critical lower limb ischemia   . Diabetes mellitus   . DM (diabetes mellitus) (HCC) 01/15/2012  . Dyslipidemia   . H/O ascending aorta repair   . Hematuria 01/15/2012  . Hypertension   . Ischemic cardiomyopathy   . Left ventricular apical thrombus following MI, NSTEMI, 01/13/12 01/15/2012  .  LV (left ventricular) mural thrombus    inferapical  . S/P CABG x 3 01/22/2012   LIMA to LAD,SVG to second diagonal,SVG to distal RCA  . Staghorn renal calculus 01/15/2012  . Stroke (HCC)   . Thrombocytopenia [facility])    Past Surgical History  Past Surgical History:  Procedure Laterality Date  . ABDOMINAL ANGIOGRAM  03/03/2013   Procedure: ABDOMINAL ANGIOGRAM;  Surgeon: Runell Gess, MD;  Location: [facility] CATH LAB;  Service: Cardiovascular;;  . CAROTID ENDARTERECTOMY  01/13/2012  . CORONARY ANGIOGRAM  01/16/2012   Procedure: CORONARY ANGIOGRAM;  Surgeon: Thurmon Fair, MD;  Location: [facility] CATH LAB;  Service: Cardiovascular;;  . CORONARY ARTERY BYPASS GRAFT  01/22/2012   Procedure: CORONARY ARTERY BYPASS GRAFTING (CABG);  Surgeon: Kerin Perna, MD;  [facility];  Service: Open Heart Surgery;  Laterality: N/A;  CABG x three using left internal mammary artery and right leg greater saphenous vein harvested endoscopically  . ENDARTERECTOMY  01/22/2012   Procedure: ENDARTERECTOMY CAROTID;  Surgeon: Larina Earthly, MD;  Location: [facility] OR;  Service: Vascular;  Laterality: Left;  . LOWER EXTREMITY ANGIOGRAM Left 03/03/2013   Procedure: LOWER EXTREMITY ANGIOGRAM;  Surgeon: Runell Gess, MD;  Location: [facility] CATH LAB;  Service: Cardiovascular;  Laterality: Left;  . NM MYOVIEW LTD  02/20/2012  infarct/scar w/mild perinfarct ischemia, EF 38%, abnormal nuc, no sign. ischemia  . REPAIR THORACIC AORTA     Ascending thoracic aorto repair   Family History  Family History  Problem Relation Age of Onset  . Heart disease Father   . Heart attack Brother    Social History  reports that he quit smoking about 5 years ago. His smoking use included cigarettes. He smoked 1.00 pack per day. he has never used smokeless tobacco. He reports that he does not drink alcohol or use drugs. Allergies No Known Allergies Home medications Prior to Admission medications   Medication Sig Start Date End Date Taking? Authorizing  Provider  acetaminophen (TYLENOL) 500 MG tablet Take 1,000 mg by mouth every 6 (six) hours as needed for pain.    Yes [provider]  aspirin EC 81 MG tablet Take 81 mg by mouth daily.   Yes [provider]  albuterol (PROVENTIL HFA;VENTOLIN HFA) 108 (90 BASE) MCG/ACT inhaler Inhale 2 puffs into the lungs every 6 (six) hours as needed for wheezing. Patient not taking: Reported on 06/11/2017 06/25/13   Runell Gess, MD  atorvastatin (LIPITOR) 20 MG tablet TAKE ONE TABLET BY MOUTH ONCE DAILY Patient not taking: Reported on 06/11/2017 08/20/15   Croitoru, Rachelle Hora, MD  ipratropium (ATROVENT HFA) 17 MCG/ACT inhaler Inhale 2 puffs into the lungs every 6 (six) hours as needed for wheezing. Patient not taking: Reported on 06/11/2017 07/21/14   Croitoru, Mihai, MD  lisinopril (PRINIVIL,ZESTRIL) 5 MG tablet TAKE ONE TABLET BY MOUTH ONCE DAILY Patient not taking: Reported on 06/11/2017 08/20/15   Croitoru, Rachelle Hora, MD  NITROSTAT 0.4 MG SL tablet DISSOLVE ONE TABLET UNDER THE TONGUE EVERY 5 MINUTES AS NEEDED FOR CHEST PAIN.  DO NOT EXCEED A TOTAL OF 3 DOSES IN 15 MINUTES NOW Patient not taking: Reported on 06/11/2017 06/24/15   Croitoru, Mihai, MD  traMADol (ULTRAM) 50 MG tablet TAKE ONE TABLET BY MOUTH EVERY 6 HOURS AS NEEDED FOR MODERATE PAIN Patient not taking: Reported on 02/06/2016 08/20/15   Croitoru, Mihai, MD  warfarin (COUMADIN) 5 MG tablet TAKE ONE TABLET BY MOUTH ONCE DAILY OR AS  DIRECTED  BY  COUMADIN  CLINIC Patient not taking: Reported on 06/11/2017 05/19/15   Dwana Melena, PA-C   Liver Function Tests Recent Labs  Lab 06/25/17 1749  AST 19  ALT 11*  ALKPHOS 80  BILITOT 0.8  PROT 6.9  ALBUMIN 4.1   No results for input(s): LIPASE, AMYLASE in the last 168 hours. CBC Recent Labs  Lab 06/25/17 1749 06/25/17 1802  WBC 8.5  --   NEUTROABS 6.1  --   HGB 15.6 15.0  HCT 44.9 44.0  MCV 96.8  --   PLT 172  --    Basic Metabolic Panel Recent Labs  Lab  06/25/17 1749 06/25/17 1802  NA 141 141  K 4.5 4.5  CL 109 106  CO2 25  --   GLUCOSE 113* 112*  BUN 13 12  CREATININE 1.08 1.00  CALCIUM 9.1  --      Vitals:   06/25/17 1830 06/25/17 1900 06/25/17 1930 06/25/17 2031  BP: 139/62 (!) 153/55 (!) 147/53 (!) 149/69  Pulse: 69 63 (!) 59 62  Resp:  15 (!) 24 20  Temp:      TempSrc:      SpO2:    100%   Exam: Gen disheveled elderly WM, not in distress, wet cough from time to time No rash, cyanosis or gangrene Sclera  anicteric, throat clear  No jvd or bruits Chest some scattered coarse exp wheezes, no sig rales or bronchial BS RRR no MRG Abd soft ntnd no mass or ascites +bs GU normal male, incontinent of urine MS no joint effusions or deformity Ext no LE or UE edema / no wounds or ulcers Neuro is alert, responsive, eyes open, poor vision RUE is 4-/ 5, LUE 5/5 RLE is 4-/5,  LLE 5/5    Home meds: -lisinopril 5 mg qd -ecasa 81 qd/ coumadin 5 mg qd -lipitor 20/ atrovent prn/ sl NTG prn/ ultram 50 mg qid prn/ albuterol prn hfa   Na 141  K 4.5  BUN 13  Cr 1.08   Alb 4.1  LFT's ok  WBC 8k Hb 15  plt 172 Trop 0.04    UA 6-30 rbc/ 6-30 wbc/ 0-5 epis, no bact, neg protein UDS negative  EKG (independ reviewed) > NSR, old inf MI,  LVH  CT head and c-spine > 1. No acute intracranial abnormality or significant interval change. 2. 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 3. Stable remote lacunar infarcts of the basal ganglia. 4. Extensive vascular calcifications. 5. Stable multilevel spondylosis cervical spine without acute abnormality.  CTA head and neck >  1. Occlusion of the right common carotid artery at its mid aspect, age indeterminate, and may be chronic given at this would not fit with patient's current symptoms. Right carotid artery system is occluded to the level of the circle of Willis. Distal reconstitution at the level of the right ICA terminus with fairly robust flow  throughout the right MCA territory. 2. Extensive atheromatous irregularity throughout the left ICA and MCA without large vessel occlusion. Moderate carotid siphon atherosclerosis without high-grade or correctable stenosis. 3. Extensive atheromatous irregularity throughout the vertebrobasilar system, with moderate to severe multifocal stenoses involving the basilar artery. 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 Distal PCA branches not well visualized, suspected to be occluded given the chronic bilateral PCA territory infarcts. 4. 3 mm focal outpouching arising at the confluence of the right P1 segment and posterior communicating artery, suspicious for small aneurysm.   Assessment: 1. Acute CVA - w/ slurred speech, R sided weakness. Hx prior CVA w/ dysphagia and facial asymmetry. Not TPA candidate.  See neuro notes.  [facility], neurology w/u for CVA.   2. PAD severe - hx L CEA + CABG 2013, remote aortobifem by CT scan, LE ischemic issues in 2014 3. Ischemic CM - no evidence of CHF 4. HTN - BP's good, on lisinopril only, hold if SBP < 140 for permissive HTN 5. Hx LV thrombus - on coumadin Rx. INR is 1.08, he is f/b cardiology, not sure he is taking coumadin.  Have d/w neurology, recommended resume coumadin, no need for heparin bridge 6. Hx staghorn L renal calculus.  7. HL   [facility]       Parnell Spieler D [facility] Pager [phone]   If 7PM-7AM, please contact night-coverage [email or web address] Password [email or web address] 06/25/2017, 10:16 PM

## 2017-06-25 NOTE — ED Notes (Signed)
Telestroke initiated, Dr. Estell Harpin at bedside.

## 2017-06-25 NOTE — ED Notes (Signed)
Pt brought in by son and states father is presenting with stroke like symptoms x 1.5 hours. Pt with slurred speech and R facial droop. Pt's son states pt with hx of stroke and he has similar symptoms, also reports patient had a fall today and has skin tear on L knee. MD notified.

## 2017-06-25 NOTE — Consult Note (Addendum)
[facility] TeleNeurology Consult Services  Impression: Stroke. Left hemispheric, pure motor with right field cut- suspect field cut is old, but unclear if patient was initially aphasic when son found him. R/o lvo   Not a tpa candidate due to: out of therapeutic window, suspect he had a stroke in the past three weeks per history   Differential Diagnosis:   1. Cardioembolic stroke  2. Small vessel disease/lacune  3. Thromboembolic, artery-to-artery mechanism  4. Hypercoagulable state-related infarct  5. Transient ischemic attack  6. Thrombotic mechanism, large artery disease   Comments:   [facility] contacted: [phone] [facility] at bedside: [phone]  Recommendations:  Cta head and neck with and without contrast to r/o lvo If no lvo, admit to stroke telemetry floor Neuro checks dvt prophy Dysphagia screen Head of bed flat Iv fluids ns Continue asa  inpatient neurology consultation Inpatient stroke evaluation as per Neurology/ Internal Medicine Discussed with ED MD  Addendum: No acute lvo per cta. Right ica occlusion Admit for further workup Discussed with edmd  -----------------------------------------------------------------------------------------  CC right sided weakness  History of Present Illness   Patient is a 76 y.o. man who was brought to the hospital for right facial weakness and dysarthria. The last time he was seen normal was around 930am. At 115 the son arrived to the house and hear him collapse to the floor coming out of the bathroom. He had slurred speech. Per son this improved to the point that he could be understood, but not at his baseline. He has a history of a throbus in his heart, he is supposed to be on coumadin but never took it, so he takes asa only. For the past 3 weeks the son has noticed that the patient has been having difficulties with his vision, mainly on the right side   Diagnostic: Ct head without contrast:  1. No acute  intracranial abnormality or significant interval change. 2. Aspects 10/10 3. 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 4. Stable remote lacunar infarcts of the basal ganglia. 5. Extensive vascular calcifications. 6. Stable multilevel spondylosis cervical spine without acute abnormality. Exam:  NIHSS score: 7 1A: Level of Consciousness - Alert; keenly responsive 1B: Ask Month and Age - Both Questions Right 1C: 'Blink Eyes' & 'Squeeze Hands' - Performs Both Tasks 2: Test Horizontal Extraocular Movements - Normal 3: Test Visual Fields - Complete Hemianopia 4: Test Facial Palsy - Minor paralysis (flat nasolabial fold, smile asymetry) 5A: Test Left Arm Motor Drift - No Drift for 10 Seconds 5B: Test Right Arm Motor Drift - Drift, but doesn't hit bed 6A: Test Left Leg Motor Drift - No Drift for 5 Seconds 6B: Test Right Leg Motor Drift -Drift, but doesn't hit bed 7: Test Limb Ataxia - No Ataxia 8: Test Sensation - Normal; No sensory loss 9: Test Language/Aphasia - Normal; No aphasia 10: Test Dysarthria - Severe Dysarthria: Unintelligble Slurring or Out of Proportion to Dysphasia 11: Test Extinction/Inattention - No abnormality       Medical Decision Making:  - Extensive number of diagnosis or management options are considered above.   - Extensive amount of complex data reviewed.   - High risk of complication and/or morbidity or mortality are associated with differential diagnostic considerations above.  - There may be Uncertain outcome and increased probability of prolonged functional impairment or high probability of severe prolonged functional impairment associated with some of these differential diagnosis.  Medical Data Reviewed:  1.Data reviewed include clinical labs, radiology,  Medical Tests;   2.Tests results discussed w/performing or interpreting physician;   3.Obtaining/reviewing old medical records;  4.Obtaining case history from  another source;  5.Independent review of image, tracing or specimen.    Patient was informed the Neurology Consult would happen via telehealth (remote video) and consented to receiving care in this manner.

## 2017-06-25 NOTE — ED Triage Notes (Signed)
Pt here for slurred speech and right sided facial droop. Pt last seen normal at 9AM by his son.

## 2017-06-25 NOTE — ED Notes (Signed)
Bed: WA11 Expected date:  Expected time:  Means of arrival:  Comments: Triage 3 

## 2017-06-26 ENCOUNTER — Inpatient Hospital Stay (HOSPITAL_COMMUNITY): Payer: Medicare Other

## 2017-06-26 ENCOUNTER — Other Ambulatory Visit: Payer: Self-pay

## 2017-06-26 DIAGNOSIS — I639 Cerebral infarction, unspecified: Secondary | ICD-10-CM

## 2017-06-26 LAB — HEMOGLOBIN A1C
Hgb A1c MFr Bld: 4.9 % (ref 4.8–5.6)
Mean Plasma Glucose: 93.93 mg/dL

## 2017-06-26 LAB — LIPID PANEL
CHOLESTEROL: 176 mg/dL (ref 0–200)
HDL: 39 mg/dL — ABNORMAL LOW (ref >40)
LDL Cholesterol: 117 mg/dL — ABNORMAL HIGH (ref 0–99)
Total CHOL/HDL Ratio: 4.5 RATIO
Triglycerides: 98 mg/dL (ref <150)
VLDL: 20 mg/dL (ref 0–40)

## 2017-06-26 MED ORDER — STROKE: EARLY STAGES OF RECOVERY BOOK
Freq: Once | Status: AC
  Administered 2017-06-27: 06:00:00

## 2017-06-26 MED ORDER — ASPIRIN EC 81 MG PO TBEC
81.0000 mg | DELAYED_RELEASE_TABLET | Freq: Every day | ORAL | Status: DC
  Administered 2017-06-26 – 2017-06-28 (×3): 81 mg via ORAL
  Filled 2017-06-26 (×3): qty 1

## 2017-06-26 MED ORDER — RESOURCE THICKENUP CLEAR PO POWD
ORAL | Status: DC | PRN
  Filled 2017-06-26: qty 125

## 2017-06-26 MED ORDER — ACETAMINOPHEN 160 MG/5ML PO SOLN
650.0000 mg | ORAL | Status: DC | PRN
Start: 2017-06-26 — End: 2017-06-28

## 2017-06-26 MED ORDER — ACETAMINOPHEN 325 MG PO TABS
650.0000 mg | ORAL_TABLET | ORAL | Status: DC | PRN
Start: 2017-06-26 — End: 2017-06-28

## 2017-06-26 MED ORDER — ALBUTEROL SULFATE (2.5 MG/3ML) 0.083% IN NEBU: 3.0000 mL | INHALATION_SOLUTION | Freq: Four times a day (QID) | RESPIRATORY_TRACT | Status: DC | PRN

## 2017-06-26 MED ORDER — WARFARIN SODIUM 7.5 MG PO TABS
7.5000 mg | ORAL_TABLET | Freq: Once | ORAL | Status: AC
  Administered 2017-06-26: 7.5 mg via ORAL
  Filled 2017-06-26: qty 1

## 2017-06-26 MED ORDER — ENOXAPARIN SODIUM 40 MG/0.4ML ~~LOC~~ SOLN
40.0000 mg | SUBCUTANEOUS | Status: DC
  Administered 2017-06-26 – 2017-06-27 (×2): 40 mg via SUBCUTANEOUS
  Filled 2017-06-26 (×2): qty 0.4

## 2017-06-26 MED ORDER — ATORVASTATIN CALCIUM 10 MG PO TABS: 20.0000 mg | ORAL_TABLET | Freq: Every day | ORAL | Status: DC

## 2017-06-26 MED ORDER — SENNOSIDES-DOCUSATE SODIUM 8.6-50 MG PO TABS: 1.0000 | ORAL_TABLET | Freq: Every evening | ORAL | Status: DC | PRN

## 2017-06-26 MED ORDER — SODIUM CHLORIDE 0.45 % IV SOLN
INTRAVENOUS | Status: DC
  Administered 2017-06-26: 02:00:00 via INTRAVENOUS

## 2017-06-26 MED ORDER — ACETAMINOPHEN 650 MG RE SUPP
650.0000 mg | RECTAL | Status: DC | PRN
Start: 2017-06-26 — End: 2017-06-28

## 2017-06-26 MED ORDER — ATORVASTATIN CALCIUM 80 MG PO TABS
80.0000 mg | ORAL_TABLET | Freq: Every day | ORAL | Status: DC
  Administered 2017-06-26 – 2017-06-28 (×3): 80 mg via ORAL
  Filled 2017-06-26 (×4): qty 1

## 2017-06-26 MED ORDER — LISINOPRIL 5 MG PO TABS
5.0000 mg | ORAL_TABLET | Freq: Every day | ORAL | Status: DC
  Administered 2017-06-26: 5 mg via ORAL
  Filled 2017-06-26 (×2): qty 1

## 2017-06-26 MED ORDER — IPRATROPIUM BROMIDE 0.02 % IN SOLN: 2.5000 mL | Freq: Four times a day (QID) | RESPIRATORY_TRACT | Status: DC

## 2017-06-26 MED ORDER — WARFARIN - PHARMACIST DOSING INPATIENT
Freq: Every day | Status: DC
  Administered 2017-06-28: 18:00:00

## 2017-06-26 NOTE — ED Notes (Signed)
SPEECH THERAPY PRESENT TO EVALUATE PT. AWARE OF SLURRED SPEECH. INCOMPREHENSIBLE AT TIMES. FAILED STROKE SWALLOW SCREEN. GIVEN SON'S ( MICHAEL ) CONTACT INFORMATION.

## 2017-06-26 NOTE — Progress Notes (Signed)
Pt arrived to the unit alert and Orient X 4.   Speech is very slurred and dysarthria  Denies pain.  All questions and concerns  Addressed.  Oriented to equipment in the room. Bed in the lowest position and alarm set.

## 2017-06-26 NOTE — ED Notes (Signed)
Attempted to call report, nurse is currently off the floor.

## 2017-06-26 NOTE — Evaluation (Signed)
Clinical/Bedside Swallow Evaluation Patient Details  Name: Charles Hatfield MRN: [id number] Date of Birth: 1941/05/23  Today's Date: 06/26/2017 Time: SLP Start Time (ACUTE ONLY): 1030 SLP Stop Time (ACUTE ONLY): 1100 SLP Time Calculation (min) (ACUTE ONLY): 30 min  Past Medical History:  Past Medical History:  Diagnosis Date  . Atrial fibrillation (HCC)    perioperative  . CAD (coronary artery disease)   . Carotid stenosis    bilateral  . COPD (chronic obstructive pulmonary disease) (HCC)   . Critical lower limb ischemia   . Diabetes mellitus   . DM (diabetes mellitus) (HCC) 01/15/2012  . Dyslipidemia   . H/O ascending aorta repair   . Hematuria 01/15/2012  . Hypertension   . Ischemic cardiomyopathy   . Left ventricular apical thrombus following MI, NSTEMI, 01/13/12 01/15/2012  . LV (left ventricular) mural thrombus    inferapical  . S/P CABG x 3 01/22/2012   LIMA to LAD,SVG to second diagonal,SVG to distal RCA  . Staghorn renal calculus 01/15/2012  . Stroke (HCC)   . Thrombocytopenia (HCC)    Past Surgical History:  Past Surgical History:  Procedure Laterality Date  . ABDOMINAL ANGIOGRAM  03/03/2013   Procedure: ABDOMINAL ANGIOGRAM;  Surgeon: Runell Gess, MD;  Location: [facility] CATH LAB;  Service: Cardiovascular;;  . CAROTID ENDARTERECTOMY  01/13/2012  . CORONARY ANGIOGRAM  01/16/2012   Procedure: CORONARY ANGIOGRAM;  Surgeon: Thurmon Fair, MD;  Location: [facility] CATH LAB;  Service: Cardiovascular;;  . CORONARY ARTERY BYPASS GRAFT  01/22/2012   Procedure: CORONARY ARTERY BYPASS GRAFTING (CABG);  Surgeon: Kerin Perna, MD;  Location: [facility] OR;  Service: Open Heart Surgery;  Laterality: N/A;  CABG x three using left internal mammary artery and right leg greater saphenous vein harvested endoscopically  . ENDARTERECTOMY  01/22/2012   Procedure: ENDARTERECTOMY CAROTID;  Surgeon: Larina Earthly, MD;  Location: [facility] OR;  Service: Vascular;  Laterality: Left;  . LOWER EXTREMITY ANGIOGRAM Left  03/03/2013   Procedure: LOWER EXTREMITY ANGIOGRAM;  Surgeon: Runell Gess, MD;  Location: [facility] CATH LAB;  Service: Cardiovascular;  Laterality: Left;  . NM MYOVIEW LTD  02/20/2012   infarct/scar w/mild perinfarct ischemia, EF 38%, abnormal nuc, no sign. ischemia  . REPAIR THORACIC AORTA     Ascending thoracic aorto repair   HPI:  Patient is a 77 y.o. male with PMH: severe PAD, prior CVA, CAD s/p CABG, HTN, L CEA who presented to the ED with son, who reported patient with new onset slurred speech and right facial droop. Head CT did not reveal any acute abnormality, with stable remote infarcts involving left occipital lobe, bilateral parietal lobes, lacunar infarcts of basal ganglia.   Assessment / Plan / Recommendation Clinical Impression  Patient presents with a mod-severe oropharyngeal dysphaiga characterized by immediate and delayed coughing and throat clearing with thin liquids (via cup, spoon, straw) and nectar thick liquids via cup and spoon. Patient with delayed oral transit of all boluses, with delayed swallow initiation, laryngeal pumping. Patient did not exhbiit any overt s/s of aspiration or penetration with puree solids or honey thick liquids, and did not exhibit any oral residuals post swallows. Patient with severe dysarthria with primary cause being significantly impaired lingual ROM and strength.  SLP Visit Diagnosis: Dysphagia, oropharyngeal phase (R13.12)    Aspiration Risk  Moderate aspiration risk    Diet Recommendation Dysphagia 1 (Puree);Honey-thick liquid   Liquid Administration via: Cup;Spoon Medication Administration: Crushed with puree Supervision: Full supervision/cueing for compensatory strategies;Staff to assist with  self feeding Compensations: Minimize environmental distractions;Small sips/bites;Slow rate Postural Changes: Seated upright at 90 degrees    Other  Recommendations Oral Care Recommendations: Oral care BID Other Recommendations: Order thickener from  pharmacy;Clarify dietary restrictions;Prohibited food (jello, ice cream, thin soups)   Follow up Recommendations Skilled Nursing facility;Inpatient Rehab      Frequency and Duration min 2x/week  2 weeks       Prognosis Prognosis for Safe Diet Advancement: Good      Swallow Study   General Date of Onset: 06/25/17 HPI: Patient is a 77 y.o. male with PMH: severe PAD, prior CVA, CAD s/p CABG, HTN, L CEA who presented to the ED with son, who reported patient with new onset slurred speech and right facial droop. Head CT did not reveal any acute abnormality, with stable remote infarcts involving left occipital lobe, bilateral parietal lobes, lacunar infarcts of basal ganglia. Type of Study: Bedside Swallow Evaluation Previous Swallow Assessment: N/A Diet Prior to this Study: NPO Temperature Spikes Noted: No Respiratory Status: Room air History of Recent Intubation: No Behavior/Cognition: Alert;Cooperative;Pleasant mood;Requires cueing Oral Cavity Assessment: Within Functional Limits Oral Care Completed by SLP: No Oral Cavity - Dentition: Missing dentition;Edentulous;Other (Comment)(edentulous on top, missing side molars on bottom) Vision: Impaired for self-feeding Self-Feeding Abilities: Needs assist;Needs set up Patient Positioning: Upright in bed Baseline Vocal Quality: Normal Volitional Cough: Weak Volitional Swallow: Unable to elicit    Oral/Motor/Sensory Function Overall Oral Motor/Sensory Function: Severe impairment Lingual ROM: Reduced right;Reduced left Lingual Strength: Reduced   Ice Chips Ice chips: Not tested   Thin Liquid Thin Liquid: Impaired Presentation: Cup;Spoon;Straw Oral Phase Impairments: Reduced labial seal;Poor awareness of bolus Oral Phase Functional Implications: Prolonged oral transit Pharyngeal  Phase Impairments: Suspected delayed Swallow;Cough - Immediate;Wet Vocal Quality    Nectar Thick Nectar Thick Liquid: Impaired Presentation: Cup;Spoon Oral  Phase Impairments: Reduced labial seal;Poor awareness of bolus Oral phase functional implications: Prolonged oral transit Pharyngeal Phase Impairments: Suspected delayed Swallow;Throat Clearing - Delayed;Throat Clearing - Immediate;Cough - Delayed   Honey Thick Honey Thick Liquid: Impaired Presentation: Spoon;Cup Oral Phase Impairments: Reduced labial seal;Poor awareness of bolus Pharyngeal Phase Impairments: Suspected delayed Swallow Other Comments: No overt s/s of penetration or aspiration   Puree Puree: Impaired Oral Phase Impairments: Poor awareness of bolus;Reduced labial seal Oral Phase Functional Implications: Prolonged oral transit Pharyngeal Phase Impairments: Suspected delayed Swallow Other Comments: laryngeal pumping and delayed swallow initiation, poor oral preparatory phase but lip rounding with spoon improved   Solid   GO   Solid: Not tested        Angela Nevin, MA, CCC-SLP 06/26/17 12:30 PM

## 2017-06-26 NOTE — ED Notes (Signed)
PT ASKED WHY HIS VOICE WAS THE WAY IT WAS. I REMINDED HIM HE HAD A STROKE. HE WAS AWARE OF HIS STROKE. HE WAS REMINDED HE WAS AT [facility]. HE WAS INFORMED HIS SON CALLED. HE WAS HUNGRY AND REQUESTING SOMETHING TO EAT.

## 2017-06-26 NOTE — ED Notes (Signed)
REPORT GIVEN BY Gery Pray RN. PT FAIL STROKE SWALLOW.

## 2017-06-26 NOTE — ED Notes (Signed)
[phone] Charles Hatfield SON

## 2017-06-26 NOTE — Consult Note (Signed)
Referring Physician: Dr. Bonner Puna    Chief Complaint: Right facial droop and dysarthria  HPI: Charles Hatfield is an 77 y.o. male who presented with acute onset of slurred speech and right facial droop. He was evaluated by teleneurology who felt that the patient had a left hemispheric CVA with pure motor weakness and right field cut. It was suspected that the field cut was old however. The patient was not a tPA candidate per teleneurology evaluation.    PMHx includes severe PAD, prior CVA, CAD s/p CABG, L CEA, severe right carotid disease, HTN and prior aortobifemoral bypass. Also with history of perioperative atrial fibrillation and history of left ventricular mural thrombus following NSTEMI in 2013. He is on Coumadin as an outpatient.   ROS by admitting team was limited by severe dysarthria. On their evaluation, the patient denied any current CP or SOB, no abd pain, and no N/V/D. He failed the swallow test done by the ED nurse.    EKG showed no atrial fibrillation.   CT head showed no acute intracranial abnormality or significant interval change. Stable remote infarcts involving the left occipital lobe and bilateral parietal lobes were noted, in addition to stable remote lacunar infarcts of the basal ganglia and extensive vascular calcifications.  CTA head and neck:  1. Occlusion of the right common carotid artery at its mid aspect, age indeterminate, and may be chronic given at this would not fit with patient's current symptoms. Right carotid artery system is occluded to the level of the circle of Willis. Distal reconstitution at the level of the right ICA terminus with fairly robust flow throughout the right MCA territory. 2. Extensive atheromatous irregularity throughout the left ICA and MCA without large vessel occlusion. Moderate carotid siphon atherosclerosis without high-grade or correctable stenosis. 3. Extensive atheromatous irregularity throughout the vertebrobasilar system, with  moderate to severe multifocal stenoses involving the basilar artery. 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 Distal PCA branches not well visualized, suspected to be occluded given the chronic bilateral PCA territory infarcts. 4. 3 mm focal outpouching arising at the confluence of the right P1 segment and posterior communicating artery, suspicious for small aneurysm.  Past Medical History:  Diagnosis Date  . Atrial fibrillation ([place])    perioperative  . CAD (coronary artery disease)   . Carotid stenosis    bilateral  . COPD (chronic obstructive pulmonary disease) ([place])   . Critical lower limb ischemia   . Diabetes mellitus   . DM (diabetes mellitus) ([place]) 01/15/2012  . Dyslipidemia   . H/O ascending aorta repair   . Hematuria 01/15/2012  . Hypertension   . Ischemic cardiomyopathy   . Left ventricular apical thrombus following MI, NSTEMI, 01/13/12 01/15/2012  . LV (left ventricular) mural thrombus    inferapical  . S/P CABG x 3 01/22/2012   LIMA to LAD,SVG to second diagonal,SVG to distal RCA  . Staghorn renal calculus 01/15/2012  . Stroke ([place])   . Thrombocytopenia ([place])     Past Surgical History:  Procedure Laterality Date  . ABDOMINAL ANGIOGRAM  03/03/2013   Procedure: ABDOMINAL ANGIOGRAM;  Surgeon: Lorretta Harp, MD;  Location: [facility] CATH LAB;  Service: Cardiovascular;;  . CAROTID ENDARTERECTOMY  01/13/2012  . CORONARY ANGIOGRAM  01/16/2012   Procedure: CORONARY ANGIOGRAM;  Surgeon: Sanda Klein, MD;  Location: [facility] CATH LAB;  Service: Cardiovascular;;  . CORONARY ARTERY BYPASS GRAFT  01/22/2012   Procedure: CORONARY ARTERY BYPASS GRAFTING (CABG);  Surgeon: Ivin Poot, MD;  Location:  [facility] OR;  Service: Open Heart Surgery;  Laterality: N/A;  CABG x three using left internal mammary artery and right leg greater saphenous vein harvested endoscopically  . ENDARTERECTOMY  01/22/2012   Procedure: ENDARTERECTOMY CAROTID;  Surgeon: Rosetta Posner, MD;  Location:  [facility];  Service: Vascular;  Laterality: Left;  . LOWER EXTREMITY ANGIOGRAM Left 03/03/2013   Procedure: LOWER EXTREMITY ANGIOGRAM;  Surgeon: Lorretta Harp, MD;  Location: [facility] CATH LAB;  Service: Cardiovascular;  Laterality: Left;  . NM MYOVIEW LTD  02/20/2012   infarct/scar w/mild perinfarct ischemia, EF 38%, abnormal nuc, no sign. ischemia  . REPAIR THORACIC AORTA     Ascending thoracic aorto repair    Family History  Problem Relation Age of Onset  . Heart disease Father   . Heart attack Brother    Social History:  reports that he quit smoking about 5 years ago. His smoking use included cigarettes. He smoked 1.00 pack per day. he has never used smokeless tobacco. He reports that he does not drink alcohol or use drugs.  Allergies: No Known Allergies  Home Medications:  Tylenol ASA Albuterol Atorvastatin Atrovent Lisinopril Nitrostat Tramadol Warfarin  ROS: As per HPI.   Physical Examination: Blood pressure (!) 141/70, pulse 67, temperature 97.7 F (36.5 C), temperature source Oral, resp. rate (!) 21, SpO2 97 %.  HEENT: [place]/AT Lungs: Respirations unlabored Ext: No edema  Neurologic Examination: Mental Status: Alert, oriented to day, month, year, city and state. Naming intact. In the context of severe dysarthria, speech is fluent.  Able to follow all commands without difficulty. Cranial Nerves: II:  Right visual field cut. PERRL. III,IV, VI: Eyes conjugate. Saccadic visual pursuits are full horizontally. Mild impairment of upgaze bilaterally.  No nystagmus.  V,VII: Right facial droop. Facial temp sensation equal bilaterally. VIII: hearing intact to voice IX,X: Pharyngeal dysarthria XI: No definite asymmetry XII: Lingual dysarthria.  Motor: RUE: 4/5 LUE: 5/5 RLE 4/5 LLE: 4+/5 Sensory: FT grossly intact x 4  Deep Tendon Reflexes:  Right biceps/brachioradialis 2+ Left biceps/brachioradialis 1+ Right patella 2+, left patella 0. 0 achilles bilaterally. Plantars:  Upgoing bilaterally Cerebellar: No ataxia with FNF bilaterally Gait: Deferred.    Results for orders placed or performed during the hospital encounter of 06/25/17 (from the past 48 hour(s))  CBG monitoring, ED     Status: Abnormal   Collection Time: 06/25/17  5:41 PM  Result Value Ref Range   Glucose-Capillary 111 (H) 65 - 99 mg/dL  Ethanol     Status: None   Collection Time: 06/25/17  5:49 PM  Result Value Ref Range   Alcohol, Ethyl (B) <10 <10 mg/dL    Comment:        LOWEST DETECTABLE LIMIT FOR SERUM ALCOHOL IS 10 mg/dL FOR MEDICAL PURPOSES ONLY   Protime-INR     Status: None   Collection Time: 06/25/17  5:49 PM  Result Value Ref Range   Prothrombin Time 13.9 11.4 - 15.2 seconds   INR 1.08   APTT     Status: None   Collection Time: 06/25/17  5:49 PM  Result Value Ref Range   aPTT 28 24 - 36 seconds  CBC     Status: None   Collection Time: 06/25/17  5:49 PM  Result Value Ref Range   WBC 8.5 4.0 - 10.5 K/uL   RBC 4.64 4.22 - 5.81 MIL/uL   Hemoglobin 15.6 13.0 - 17.0 g/dL   HCT 44.9 39.0 - 52.0 %   MCV 96.8 78.0 -  100.0 fL   MCH 33.6 26.0 - 34.0 pg   MCHC 34.7 30.0 - 36.0 g/dL   RDW 14.2 11.5 - 15.5 %   Platelets 172 150 - 400 K/uL  Differential     Status: None   Collection Time: 06/25/17  5:49 PM  Result Value Ref Range   Neutrophils Relative % 71 %   Neutro Abs 6.1 1.7 - 7.7 K/uL   Lymphocytes Relative 22 %   Lymphs Abs 1.9 0.7 - 4.0 K/uL   Monocytes Relative 3 %   Monocytes Absolute 0.3 0.1 - 1.0 K/uL   Eosinophils Relative 3 %   Eosinophils Absolute 0.2 0.0 - 0.7 K/uL   Basophils Relative 1 %   Basophils Absolute 0.0 0.0 - 0.1 K/uL  Comprehensive metabolic panel     Status: Abnormal   Collection Time: 06/25/17  5:49 PM  Result Value Ref Range   Sodium 141 135 - 145 mmol/L   Potassium 4.5 3.5 - 5.1 mmol/L   Chloride 109 101 - 111 mmol/L   CO2 25 22 - 32 mmol/L   Glucose, Bld 113 (H) 65 - 99 mg/dL   BUN 13 6 - 20 mg/dL   Creatinine, Ser 1.08 0.61 -  1.24 mg/dL   Calcium 9.1 8.9 - 10.3 mg/dL   Total Protein 6.9 6.5 - 8.1 g/dL   Albumin 4.1 3.5 - 5.0 g/dL   AST 19 15 - 41 U/L   ALT 11 (L) 17 - 63 U/L   Alkaline Phosphatase 80 38 - 126 U/L   Total Bilirubin 0.8 0.3 - 1.2 mg/dL   GFR calc non Af Amer >60 >60 mL/min   GFR calc Af Amer >60 >60 mL/min    Comment: (NOTE) The eGFR has been calculated using the CKD EPI equation. This calculation has not been validated in all clinical situations. eGFR's persistently <60 mL/min signify possible Chronic Kidney Disease.    Anion gap 7 5 - 15  I-stat troponin, ED     Status: None   Collection Time: 06/25/17  6:01 PM  Result Value Ref Range   Troponin i, poc 0.04 0.00 - 0.08 ng/mL   Comment 3            Comment: Due to the release kinetics of cTnI, a negative result within the first hours of the onset of symptoms does not rule out myocardial infarction with certainty. If myocardial infarction is still suspected, repeat the test at appropriate intervals.   I-Stat Chem 8, ED     Status: Abnormal   Collection Time: 06/25/17  6:02 PM  Result Value Ref Range   Sodium 141 135 - 145 mmol/L   Potassium 4.5 3.5 - 5.1 mmol/L   Chloride 106 101 - 111 mmol/L   BUN 12 6 - 20 mg/dL   Creatinine, Ser 1.00 0.61 - 1.24 mg/dL   Glucose, Bld 112 (H) 65 - 99 mg/dL   Calcium, Ion 1.11 (L) 1.15 - 1.40 mmol/L   TCO2 23 22 - 32 mmol/L   Hemoglobin 15.0 13.0 - 17.0 g/dL   HCT 44.0 39.0 - 52.0 %  Urine rapid drug screen (hosp performed)     Status: None   Collection Time: 06/25/17  7:00 PM  Result Value Ref Range   Opiates NONE DETECTED NONE DETECTED   Cocaine NONE DETECTED NONE DETECTED   Benzodiazepines NONE DETECTED NONE DETECTED   Amphetamines NONE DETECTED NONE DETECTED   Tetrahydrocannabinol NONE DETECTED NONE DETECTED   Barbiturates NONE DETECTED  NONE DETECTED    Comment: (NOTE) DRUG SCREEN FOR MEDICAL PURPOSES ONLY.  IF CONFIRMATION IS NEEDED FOR ANY PURPOSE, NOTIFY LAB WITHIN 5  DAYS. LOWEST DETECTABLE LIMITS FOR URINE DRUG SCREEN Drug Class                     Cutoff (ng/mL) Amphetamine and metabolites    1000 Barbiturate and metabolites    200 Benzodiazepine                 196 Tricyclics and metabolites     300 Opiates and metabolites        300 Cocaine and metabolites        300 THC                            50   Urinalysis, Routine w reflex microscopic     Status: Abnormal   Collection Time: 06/25/17  7:00 PM  Result Value Ref Range   Color, Urine YELLOW YELLOW   APPearance HAZY (A) CLEAR   Specific Gravity, Urine 1.021 1.005 - 1.030   pH 7.0 5.0 - 8.0   Glucose, UA NEGATIVE NEGATIVE mg/dL   Hgb urine dipstick SMALL (A) NEGATIVE   Bilirubin Urine NEGATIVE NEGATIVE   Ketones, ur NEGATIVE NEGATIVE mg/dL   Protein, ur NEGATIVE NEGATIVE mg/dL   Nitrite NEGATIVE NEGATIVE   Leukocytes, UA NEGATIVE NEGATIVE   RBC / HPF 6-30 0 - 5 RBC/hpf   WBC, UA 6-30 0 - 5 WBC/hpf   Bacteria, UA NONE SEEN NONE SEEN   Squamous Epithelial / LPF 0-5 (A) NONE SEEN   Mucus PRESENT   Lipid panel     Status: Abnormal   Collection Time: 06/26/17  5:00 AM  Result Value Ref Range   Cholesterol 176 0 - 200 mg/dL   Triglycerides 98 <150 mg/dL   HDL 39 (L) >40 mg/dL   Total CHOL/HDL Ratio 4.5 RATIO   VLDL 20 0 - 40 mg/dL   LDL Cholesterol 117 (H) 0 - 99 mg/dL    Comment:        Total Cholesterol/HDL:CHD Risk Coronary Heart Disease Risk Table                     Men   Women  1/2 Average Risk   3.4   3.3  Average Risk       5.0   4.4  2 X Average Risk   9.6   7.1  3 X Average Risk  23.4   11.0        Use the calculated Patient Ratio above and the CHD Risk Table to determine the patient's CHD Risk.        ATP III CLASSIFICATION (LDL):  <100     mg/dL   Optimal  100-129  mg/dL   Near or Above                    Optimal  130-159  mg/dL   Borderline  160-189  mg/dL   High  >190     mg/dL   Very High    Ct Angio Head W Or Wo Contrast  Result Date:  06/25/2017 CLINICAL DATA:  Initial evaluation for follow-up stroke. EXAM: CT ANGIOGRAPHY HEAD AND NECK TECHNIQUE: Multidetector CT imaging of the head and neck was performed using the standard protocol during bolus administration of intravenous contrast. Multiplanar CT image reconstructions and  MIPs were obtained to evaluate the vascular anatomy. Carotid stenosis measurements (when applicable) are obtained utilizing NASCET criteria, using the distal internal carotid diameter as the denominator. CONTRAST:  46m ISOVUE-370 IOPAMIDOL (ISOVUE-370) INJECTION 76% COMPARISON:  Prior head CT from earlier the same day. FINDINGS: CTA NECK FINDINGS Aortic arch: Extensive atheromatous plaque throughout the visualized aortic arch. No high-grade stenosis about the origin of the great vessels. Extensive atheromatous regularity throughout the subclavian artery is without high-grade stenosis. Right carotid system: Right common carotid artery is occluded at its mid aspect to the level of the bifurcation. Right ICA entirely included within the neck to the level of the terminus. Opacification of the right external carotid artery and its branches via collateral flow. Left carotid system: Left common carotid artery demonstrates multifocal atheromatous irregularity but is patent from its origin to the bifurcation without flow-limiting stenosis. No significant atheromatous narrowing about the left bifurcation. Left ICA patent within the neck without flow-limiting stenosis. Vertebral arteries: Both of the vertebral artery is arise from the subclavian artery is. Left vertebral artery dominant. Right vertebral artery diffusely hypoplastic. Atheromatous plaque at the origin of the vertebral arteries bilaterally with moderate to severe stenoses. Extensive multifocal atheromatous irregularity throughout the vertebral artery is with resultant mild to moderate multifocal stenoses. Right vertebral artery occludes at the cranial vault. Skeleton: No  acute osseus abnormality. No worrisome lytic or blastic osseous lesions. Advanced degenerative spondylolysis throughout the cervical spine with bulky anterior osteophytic spurring. Other neck: No acute soft tissue abnormality within the neck. Salivary glands within normal limits. Thyroid normal. No adenopathy. Upper chest: Median sternotomy wires partially visualized. No acute abnormality within the visualized upper chest. Emphysema. Review of the MIP images confirms the above findings CTA HEAD FINDINGS Anterior circulation: The right ICA occluded to the level of the terminus. Multifocal plaque within the petrous left ICA with mild narrowing. Advanced atheromatous plaque throughout the cavernous/ supraclinoid left ICA with resultant moderate multifocal irregularity and narrowing. Left ICA terminus widely patent. A1 segments widely patent without stenosis. Patent anterior communicating artery. Anterior cerebral artery is widely patent to their distal aspects without flow-limiting stenosis. Perfusion of the right ICA terminus via flow cross the circle of Willis. Right M1 segment patent without stenosis. Left M1 segment patent as well. Normal MCA bifurcations. No proximal M2 occlusion. Distal MCA branches well perfused and symmetric. Posterior circulation: Left vertebral artery dominant. Multifocal atheromatous plaque throughout the left V4 segment with moderate multifocal narrowing. Left PICA patent. Hypoplastic right vertebral artery occluded at the cranial vault. Right PICA not visualized. Extensive multifocal atheromatous regularity throughout the basilar artery with moderate to severe stenoses, most notable at its distal aspect at the level of the basilar tip (series 8, image 97). Superior cerebral arteries patent bilaterally. Left PCA supplied via the basilar artery. Right PCA supplied via a hypoplastic right P1 segment as well as a right posterior communicating artery. 3 mm ovoid blush of contrast at the  confluence of the right P1 segment and right P com suspicious for possible small aneurysm (series 8, image 97). Extensive atheromatous irregularity throughout the P2 segments bilaterally the without high-grade stenosis. Distal PCAs not well visualized, and may be occluded given the chronic bilateral PCA territory infarcts. Venous sinuses: Centrally positioned and linear filling defect involving the posterior aspect of the superior sagittal sinus is not seen on 5 minutes delayed sequence, and is favored to reflect a flow related artifact. No evidence for dural sinus thrombosis. Anatomic variants: None significant. Delayed phase: No  abnormal enhancement. Review of the MIP images confirms the above findings IMPRESSION: 1. Occlusion of the right common carotid artery at its mid aspect, age indeterminate, and may be chronic given at this would not fit with patient's current symptoms. Right carotid artery system is occluded to the level of the circle of Willis. Distal reconstitution at the level of the right ICA terminus with fairly robust flow throughout the right MCA territory. 2. Extensive atheromatous irregularity throughout the left ICA and MCA without large vessel occlusion. Moderate carotid siphon atherosclerosis without high-grade or correctable stenosis. 3. Extensive atheromatous irregularity throughout the vertebrobasilar system, with moderate to severe multifocal stenoses involving the basilar artery. 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 Distal PCA branches not well visualized, suspected to be occluded given the chronic bilateral PCA territory infarcts. 4. 3 mm focal outpouching arising at the confluence of the right P1 segment and posterior communicating artery, suspicious for small aneurysm. Critical Value/emergent results were called by telephone at the time of interpretation on 06/25/2017 at 7:47 pm to Dr. Aleatha Borer , who verbally acknowledged these results.  Electronically Signed   By: Jeannine Boga M.D.   On: 06/25/2017 20:00   Dg Chest 2 View  Result Date: 06/26/2017 CLINICAL DATA:  Acute onset of shortness of breath. Status post CVA. EXAM: CHEST  2 VIEW COMPARISON:  Chest radiograph performed 06/11/2017 FINDINGS: The lungs are well-aerated and clear. There is no evidence of focal opacification, pleural effusion or pneumothorax. The heart is borderline normal in size. The patient is status post median sternotomy, with evidence of prior CABG. No acute osseous abnormalities are seen. IMPRESSION: No acute cardiopulmonary process seen. Electronically Signed   By: Garald Balding M.D.   On: 06/26/2017 02:51   Ct Angio Neck W Or Wo Contrast  Result Date: 06/25/2017 CLINICAL DATA:  Initial evaluation for follow-up stroke. EXAM: CT ANGIOGRAPHY HEAD AND NECK TECHNIQUE: Multidetector CT imaging of the head and neck was performed using the standard protocol during bolus administration of intravenous contrast. Multiplanar CT image reconstructions and MIPs were obtained to evaluate the vascular anatomy. Carotid stenosis measurements (when applicable) are obtained utilizing NASCET criteria, using the distal internal carotid diameter as the denominator. CONTRAST:  33m ISOVUE-370 IOPAMIDOL (ISOVUE-370) INJECTION 76% COMPARISON:  Prior head CT from earlier the same day. FINDINGS: CTA NECK FINDINGS Aortic arch: Extensive atheromatous plaque throughout the visualized aortic arch. No high-grade stenosis about the origin of the great vessels. Extensive atheromatous regularity throughout the subclavian artery is without high-grade stenosis. Right carotid system: Right common carotid artery is occluded at its mid aspect to the level of the bifurcation. Right ICA entirely included within the neck to the level of the terminus. Opacification of the right external carotid artery and its branches via collateral flow. Left carotid system: Left common carotid artery demonstrates  multifocal atheromatous irregularity but is patent from its origin to the bifurcation without flow-limiting stenosis. No significant atheromatous narrowing about the left bifurcation. Left ICA patent within the neck without flow-limiting stenosis. Vertebral arteries: Both of the vertebral artery is arise from the subclavian artery is. Left vertebral artery dominant. Right vertebral artery diffusely hypoplastic. Atheromatous plaque at the origin of the vertebral arteries bilaterally with moderate to severe stenoses. Extensive multifocal atheromatous irregularity throughout the vertebral artery is with resultant mild to moderate multifocal stenoses. Right vertebral artery occludes at the cranial vault. Skeleton: No acute osseus abnormality. No worrisome lytic or blastic osseous lesions. Advanced degenerative spondylolysis throughout the cervical spine  with bulky anterior osteophytic spurring. Other neck: No acute soft tissue abnormality within the neck. Salivary glands within normal limits. Thyroid normal. No adenopathy. Upper chest: Median sternotomy wires partially visualized. No acute abnormality within the visualized upper chest. Emphysema. Review of the MIP images confirms the above findings CTA HEAD FINDINGS Anterior circulation: The right ICA occluded to the level of the terminus. Multifocal plaque within the petrous left ICA with mild narrowing. Advanced atheromatous plaque throughout the cavernous/ supraclinoid left ICA with resultant moderate multifocal irregularity and narrowing. Left ICA terminus widely patent. A1 segments widely patent without stenosis. Patent anterior communicating artery. Anterior cerebral artery is widely patent to their distal aspects without flow-limiting stenosis. Perfusion of the right ICA terminus via flow cross the circle of Willis. Right M1 segment patent without stenosis. Left M1 segment patent as well. Normal MCA bifurcations. No proximal M2 occlusion. Distal MCA branches well  perfused and symmetric. Posterior circulation: Left vertebral artery dominant. Multifocal atheromatous plaque throughout the left V4 segment with moderate multifocal narrowing. Left PICA patent. Hypoplastic right vertebral artery occluded at the cranial vault. Right PICA not visualized. Extensive multifocal atheromatous regularity throughout the basilar artery with moderate to severe stenoses, most notable at its distal aspect at the level of the basilar tip (series 8, image 97). Superior cerebral arteries patent bilaterally. Left PCA supplied via the basilar artery. Right PCA supplied via a hypoplastic right P1 segment as well as a right posterior communicating artery. 3 mm ovoid blush of contrast at the confluence of the right P1 segment and right P com suspicious for possible small aneurysm (series 8, image 97). Extensive atheromatous irregularity throughout the P2 segments bilaterally the without high-grade stenosis. Distal PCAs not well visualized, and may be occluded given the chronic bilateral PCA territory infarcts. Venous sinuses: Centrally positioned and linear filling defect involving the posterior aspect of the superior sagittal sinus is not seen on 5 minutes delayed sequence, and is favored to reflect a flow related artifact. No evidence for dural sinus thrombosis. Anatomic variants: None significant. Delayed phase: No abnormal enhancement. Review of the MIP images confirms the above findings IMPRESSION: 1. Occlusion of the right common carotid artery at its mid aspect, age indeterminate, and may be chronic given at this would not fit with patient's current symptoms. Right carotid artery system is occluded to the level of the circle of Willis. Distal reconstitution at the level of the right ICA terminus with fairly robust flow throughout the right MCA territory. 2. Extensive atheromatous irregularity throughout the left ICA and MCA without large vessel occlusion. Moderate carotid siphon atherosclerosis  without high-grade or correctable stenosis. 3. Extensive atheromatous irregularity throughout the vertebrobasilar system, with moderate to severe multifocal stenoses involving the basilar artery. 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 Distal PCA branches not well visualized, suspected to be occluded given the chronic bilateral PCA territory infarcts. 4. 3 mm focal outpouching arising at the confluence of the right P1 segment and posterior communicating artery, suspicious for small aneurysm. Critical Value/emergent results were called by telephone at the time of interpretation on 06/25/2017 at 7:47 pm to Dr. Aleatha Borer , who verbally acknowledged these results. Electronically Signed   By: Jeannine Boga M.D.   On: 06/25/2017 20:00   Ct Cervical Spine Wo Contrast  Result Date: 06/25/2017 CLINICAL DATA:  Acute onset of slurred speech and right-sided facial droop. Last seen normal 9 hours ago. EXAM: CT HEAD WITHOUT CONTRAST CT CERVICAL SPINE WITHOUT CONTRAST TECHNIQUE: Multidetector CT imaging  of the head and cervical spine was performed following the standard protocol without intravenous contrast. Multiplanar CT image reconstructions of the cervical spine were also generated. COMPARISON:  CT head without contrast 02/06/2016. FINDINGS: CT HEAD FINDINGS Brain: Remote encephalomalacia of the left occipital lobe known bilateral parietal lobes is stable. Remote lacunar infarcts of the basal ganglia are stable bilaterally. Remote lacunar infarct is again seen in the right thalamus. No acute cortical infarct, hemorrhage, or mass lesion is present. The insular ribbon is intact bilaterally. The basal ganglia are stable. The brainstem and cerebellum are within normal limits. Vascular: Dense calcifications are present within the cavernous internal carotid arteries in the left vertebral artery. There is no hyperdense vessel. Skull: Calvarium is intact. No focal lytic or blastic  lesions are present. Sinuses/Orbits: Remote left orbital blowout fracture is stable. The paranasal sinuses and mastoid air cells are otherwise within normal limits. Globes and orbits are otherwise normal. CT CERVICAL SPINE FINDINGS Alignment: Grade 1 anterolisthesis at C2-3 is degenerative. AP alignment is otherwise anatomic. Skull base and vertebrae: The craniocervical junction is normal. Marked degenerative changes are again seen at C1-2. Vertebral body heights are normal. No acute or healing fracture is present. Soft tissues and spinal canal: Vascular calcifications are present bilaterally, most prominent at the right carotid bifurcation. There are calcifications along the left vertebral artery. These stable. Disc levels: Central canal stenosis is most severe at C3-4 with prominent posterior calcifications narrow the canal. Foraminal narrowing is also greatest at C3-4 without significant change. There is left-sided foraminal disease at C5-6 and C6-7 similar the prior study as well. Upper chest: The lung apices are clear. IMPRESSION: 1. No acute intracranial abnormality or significant interval change. 2. Aspects 10/10 3. 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 4. Stable remote lacunar infarcts of the basal ganglia. 5. Extensive vascular calcifications. 6. Stable multilevel spondylosis cervical spine without acute abnormality. 7.  Aortic Atherosclerosis (ICD10-I70.0). These results were called by telephone at the time of interpretation on 06/25/2017 at 6:10 pm to Dr. Milton Ferguson , who verbally acknowledged these results. Electronically Signed   By: San Morelle M.D.   On: 06/25/2017 18:10   Ct Head Code Stroke Wo Contrast  Result Date: 06/25/2017 CLINICAL DATA:  Acute onset of slurred speech and right-sided facial droop. Last seen normal 9 hours ago. EXAM: CT HEAD WITHOUT CONTRAST CT CERVICAL SPINE WITHOUT CONTRAST TECHNIQUE: Multidetector CT imaging of  the head and cervical spine was performed following the standard protocol without intravenous contrast. Multiplanar CT image reconstructions of the cervical spine were also generated. COMPARISON:  CT head without contrast 02/06/2016. FINDINGS: CT HEAD FINDINGS Brain: Remote encephalomalacia of the left occipital lobe known bilateral parietal lobes is stable. Remote lacunar infarcts of the basal ganglia are stable bilaterally. Remote lacunar infarct is again seen in the right thalamus. No acute cortical infarct, hemorrhage, or mass lesion is present. The insular ribbon is intact bilaterally. The basal ganglia are stable. The brainstem and cerebellum are within normal limits. Vascular: Dense calcifications are present within the cavernous internal carotid arteries in the left vertebral artery. There is no hyperdense vessel. Skull: Calvarium is intact. No focal lytic or blastic lesions are present. Sinuses/Orbits: Remote left orbital blowout fracture is stable. The paranasal sinuses and mastoid air cells are otherwise within normal limits. Globes and orbits are otherwise normal. CT CERVICAL SPINE FINDINGS Alignment: Grade 1 anterolisthesis at C2-3 is degenerative. AP alignment is otherwise anatomic. Skull base and vertebrae:  The craniocervical junction is normal. Marked degenerative changes are again seen at C1-2. Vertebral body heights are normal. No acute or healing fracture is present. Soft tissues and spinal canal: Vascular calcifications are present bilaterally, most prominent at the right carotid bifurcation. There are calcifications along the left vertebral artery. These stable. Disc levels: Central canal stenosis is most severe at C3-4 with prominent posterior calcifications narrow the canal. Foraminal narrowing is also greatest at C3-4 without significant change. There is left-sided foraminal disease at C5-6 and C6-7 similar the prior study as well. Upper chest: The lung apices are clear. IMPRESSION: 1. No  acute intracranial abnormality or significant interval change. 2. Aspects 10/10 3. 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 4. Stable remote lacunar infarcts of the basal ganglia. 5. Extensive vascular calcifications. 6. Stable multilevel spondylosis cervical spine without acute abnormality. 7.  Aortic Atherosclerosis (ICD10-I70.0). These results were called by telephone at the time of interpretation on 06/25/2017 at 6:10 pm to Dr. Milton Ferguson , who verbally acknowledged these results. Electronically Signed   By: San Morelle M.D.   On: 06/25/2017 18:10    Assessment: 77 y.o. male with multiple old strokes on CT head, presents with new onset of dysarthria and right facial droop. 1. Deemed not to be a candidate for tPA by teleneurology 2. 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 Right carotid artery system is occluded to the level of the circle of Willis. Distal reconstitution at the level of the right ICA terminus with fairly robust flow throughout the right MCA territory. Extensive atheromatous irregularity throughout the left ICA and MCA without large vessel occlusion. Moderate carotid siphon atherosclerosis without high-grade or correctable stenosis. Extensive atheromatous irregularity throughout the vertebrobasilar system, with moderate to severe multifocal stenoses involving the basilar artery. 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 Distal PCA branches not well visualized, suspected to be occluded given the chronic bilateral PCA territory infarcts. Also noted is a 3 mm focal outpouching arising at the confluence of the right P1 segment and posterior communicating artery, suspicious for small aneurysm. 3. Stroke Risk Factors -  DM, PAD, prior CVA, CAD, carotid atherosclerotic disease, HTN,  history of perioperative atrial fibrillation, history of left ventricular mural thrombus following NSTEMI in 2013. 4. History of cardiac mural thrombus. On Coumadin as outpatient. INR subtherapeutic.   Plan: 1. Restart Coumadin.  2. MRI of the brain without contrast 3. PT consult, OT consult, Speech consult 4. Echocardiogram 5. Continue ASA for cardiac indication 6. Telemetry monitoring 7. Frequent neuro checks 8. HgbA1c, fasting lipid panel   '@Electronically'$  signed: Dr. Kerney Elbe 06/26/2017, 8:41 AM

## 2017-06-26 NOTE — Progress Notes (Signed)
PROGRESS NOTE  Charles Hatfield  RUE:[id number] DOB: 1941-01-14 DOA: 06/25/2017 PCP: Default, Provider, MD   Brief Narrative: Charles Hatfield is a 77 y.o. male with a history of PAD s/p aortobifemoral bypass and left CEA, CVA, CAD s/p CABG, intracardiac thrombus nonadherent to coumadin, and HTN who presented to the ED for new slurred speech and right facial droop for several hours. He also had an unwitnessed fall at home in the bathroom, and had a 3 week history of right-sided vision deficits. Initial CT in the ED demonstrated stable remote infarcts in bilateral parietal and left occipital lobes, remote basal ganglia lacunes and no acute findings. Teleneurology was consulted, felt he was not tPA candidate due to outside window. CTA head and neck showed time-indeterminate occlusion of the right common carotid artery and extensive atherosclerosis.   Assessment & Plan: Principal Problem:   Acute CVA (cerebrovascular accident) [facility]) Active Problems:   HTN (hypertension)   Left ventricular apical thrombus following MI, NSTEMI, 01/13/12   CAD (coronary artery disease) status post CABG July 2013   COPD (chronic obstructive pulmonary disease) (HCC)   Ischemic cardiomyopathy, EF 40-45% 2D 01/15/12   History of stroke   S/P carotid endarterectomy   Lt, 01/23/12   CKD (chronic kidney disease) stage 3, GFR 30-59 ml/min (HCC)   Mixed hyperlipidemia   Right hemiparesis (HCC)   Dysarthria   Dysphagia   Long term (current) use of anticoagulants  Cerebrovascular disease: With suspected CVA causing new slurred speech and recent right visual field deficits.  - Restart coumadin per pharmacy w/o bridge per neurology given history of LV mural thrombus. Also checking echocardiogram.  - Continue ASA - SLP evaluation and start diet based on findings.  - MRI brain, cerebral perfusion study ordered.  - Increase intensity statin. [id number]. HbA1c 4.9%.  - Permissive HTN - DC dextrose IVF - PT/OT eval  Diffuse  atherosclerotic disease including PAD, carotid stenosis and CVD as above.  - ASA, BP and CBG control.   CAD s/p CABG and ischemic cardiomyopathy: No chest pain/dyspnea. Troponin neg. Euvolemic - Aspirin.  - Per cardiology note, beta blockers avoided due to COPD.   COPD: Stable, no exacerbation currently.  - Continue prn bronchodilators  DVT prophylaxis: Lovenox ordered since INR so subtherapeutic Code Status: Full Family Communication: None at bedside on my evaluation Disposition Plan: Awaiting admission to [facility] for neurology services  Consultants:   Neurology  Procedures:   Echocardiogram  Antimicrobials:  None   Subjective: Severe dysphagia limiting history, states he's hungry prior to swallow evaluation. Denies chest pain, dyspnea, wheezing, cough. Denies numbness or weakness that he knows of.   Objective: Vitals:   06/26/17 0800 06/26/17 0830 06/26/17 0900 06/26/17 0909  BP: (!) 143/46 (!) 149/65 (!) 131/46   Pulse: 69 72 71 70  Resp: (!) 24 15 (!) 22 17  Temp:      TempSrc:      SpO2:  96%     Gen: Unkempt 77 y.o. male in no distress.  Pulm: Non-labored breathing room air. Scattered coarse wheezes.  CV: Regular rate and rhythm. No murmur, rub, or gallop. No JVD, no pedal edema. GI: Abdomen soft, non-tender, non-distended, with normoactive bowel sounds. No organomegaly or masses felt. Ext: Warm, no deformities Skin: No rashes, lesions, ulcers Neuro: Alert and appears oriented. R facial droop with dysphasia, right visual field cut to confrontation compared to left. Slightly diminished right-sided strength without focal sensory deficits.  Psych: Judgement and insight appear normal. Mood &  affect appropriate.   Data Reviewed: I have personally reviewed following labs and imaging studies  CBC: Recent Labs  Lab 06/25/17 1749 06/25/17 1802  WBC 8.5  --   NEUTROABS 6.1  --   HGB 15.6 15.0  HCT 44.9 44.0  MCV 96.8  --   PLT 172  --    Basic Metabolic  Panel: Recent Labs  Lab 06/25/17 1749 06/25/17 1802  NA 141 141  K 4.5 4.5  CL 109 106  CO2 25  --   GLUCOSE 113* 112*  BUN 13 12  CREATININE 1.08 1.00  CALCIUM 9.1  --    GFR: CrCl cannot be calculated (Unknown ideal weight.). Liver Function Tests: Recent Labs  Lab 06/25/17 1749  AST 19  ALT 11*  ALKPHOS 80  BILITOT 0.8  PROT 6.9  ALBUMIN 4.1   No results for input(s): LIPASE, AMYLASE in the last 168 hours. No results for input(s): AMMONIA in the last 168 hours. Coagulation Profile: Recent Labs  Lab 06/25/17 1749  INR 1.08   Cardiac Enzymes: No results for input(s): CKTOTAL, CKMB, CKMBINDEX, TROPONINI in the last 168 hours. BNP (last 3 results) No results for input(s): PROBNP in the last 8760 hours. HbA1C: Recent Labs    06/26/17 0500  HGBA1C 4.9   CBG: Recent Labs  Lab 06/25/17 1741  GLUCAP 111*   Lipid Profile: Recent Labs    06/26/17 0500  CHOL 176  HDL 39*  LDLCALC 117*  TRIG 98  CHOLHDL 4.5   Thyroid Function Tests: No results for input(s): TSH, T4TOTAL, FREET4, T3FREE, THYROIDAB in the last 72 hours. Anemia Panel: No results for input(s): VITAMINB12, FOLATE, FERRITIN, TIBC, IRON, RETICCTPCT in the last 72 hours. Urine analysis:    Component Value Date/Time   COLORURINE YELLOW 06/25/2017 1900   APPEARANCEUR HAZY (A) 06/25/2017 1900   LABSPEC 1.021 06/25/2017 1900   PHURINE 7.0 06/25/2017 1900   GLUCOSEU NEGATIVE 06/25/2017 1900   HGBUR SMALL (A) 06/25/2017 1900   BILIRUBINUR NEGATIVE 06/25/2017 1900   KETONESUR NEGATIVE 06/25/2017 1900   PROTEINUR NEGATIVE 06/25/2017 1900   UROBILINOGEN 1 06/10/2014 1518   NITRITE NEGATIVE 06/25/2017 1900   LEUKOCYTESUR NEGATIVE 06/25/2017 1900   No results found for this or any previous visit (from the past 240 hour(s)).    Radiology Studies: Ct Angio Head W Or Wo Contrast  Result Date: 06/25/2017 CLINICAL DATA:  Initial evaluation for follow-up stroke. EXAM: CT ANGIOGRAPHY HEAD AND NECK  TECHNIQUE: Multidetector CT imaging of the head and neck was performed using the standard protocol during bolus administration of intravenous contrast. Multiplanar CT image reconstructions and MIPs were obtained to evaluate the vascular anatomy. Carotid stenosis measurements (when applicable) are obtained utilizing NASCET criteria, using the distal internal carotid diameter as the denominator. CONTRAST:  75mL ISOVUE-370 IOPAMIDOL (ISOVUE-370) INJECTION 76% COMPARISON:  Prior head CT from earlier the same day. FINDINGS: CTA NECK FINDINGS Aortic arch: Extensive atheromatous plaque throughout the visualized aortic arch. No high-grade stenosis about the origin of the great vessels. Extensive atheromatous regularity throughout the subclavian artery is without high-grade stenosis. Right carotid system: Right common carotid artery is occluded at its mid aspect to the level of the bifurcation. Right ICA entirely included within the neck to the level of the terminus. Opacification of the right external carotid artery and its branches via collateral flow. Left carotid system: Left common carotid artery demonstrates multifocal atheromatous irregularity but is patent from its origin to the bifurcation without flow-limiting stenosis. No significant atheromatous  narrowing about the left bifurcation. Left ICA patent within the neck without flow-limiting stenosis. Vertebral arteries: Both of the vertebral artery is arise from the subclavian artery is. Left vertebral artery dominant. Right vertebral artery diffusely hypoplastic. Atheromatous plaque at the origin of the vertebral arteries bilaterally with moderate to severe stenoses. Extensive multifocal atheromatous irregularity throughout the vertebral artery is with resultant mild to moderate multifocal stenoses. Right vertebral artery occludes at the cranial vault. Skeleton: No acute osseus abnormality. No worrisome lytic or blastic osseous lesions. Advanced degenerative  spondylolysis throughout the cervical spine with bulky anterior osteophytic spurring. Other neck: No acute soft tissue abnormality within the neck. Salivary glands within normal limits. Thyroid normal. No adenopathy. Upper chest: Median sternotomy wires partially visualized. No acute abnormality within the visualized upper chest. Emphysema. Review of the MIP images confirms the above findings CTA HEAD FINDINGS Anterior circulation: The right ICA occluded to the level of the terminus. Multifocal plaque within the petrous left ICA with mild narrowing. Advanced atheromatous plaque throughout the cavernous/ supraclinoid left ICA with resultant moderate multifocal irregularity and narrowing. Left ICA terminus widely patent. A1 segments widely patent without stenosis. Patent anterior communicating artery. Anterior cerebral artery is widely patent to their distal aspects without flow-limiting stenosis. Perfusion of the right ICA terminus via flow cross the circle of Willis. Right M1 segment patent without stenosis. Left M1 segment patent as well. Normal MCA bifurcations. No proximal M2 occlusion. Distal MCA branches well perfused and symmetric. Posterior circulation: Left vertebral artery dominant. Multifocal atheromatous plaque throughout the left V4 segment with moderate multifocal narrowing. Left PICA patent. Hypoplastic right vertebral artery occluded at the cranial vault. Right PICA not visualized. Extensive multifocal atheromatous regularity throughout the basilar artery with moderate to severe stenoses, most notable at its distal aspect at the level of the basilar tip (series 8, image 97). Superior cerebral arteries patent bilaterally. Left PCA supplied via the basilar artery. Right PCA supplied via a hypoplastic right P1 segment as well as a right posterior communicating artery. 3 mm ovoid blush of contrast at the confluence of the right P1 segment and right P com suspicious for possible small aneurysm (series 8,  image 97). Extensive atheromatous irregularity throughout the P2 segments bilaterally the without high-grade stenosis. Distal PCAs not well visualized, and may be occluded given the chronic bilateral PCA territory infarcts. Venous sinuses: Centrally positioned and linear filling defect involving the posterior aspect of the superior sagittal sinus is not seen on 5 minutes delayed sequence, and is favored to reflect a flow related artifact. No evidence for dural sinus thrombosis. Anatomic variants: None significant. Delayed phase: No abnormal enhancement. Review of the MIP images confirms the above findings IMPRESSION: 1. Occlusion of the right common carotid artery at its mid aspect, age indeterminate, and may be chronic given at this would not fit with patient's current symptoms. Right carotid artery system is occluded to the level of the circle of Willis. Distal reconstitution at the level of the right ICA terminus with fairly robust flow throughout the right MCA territory. 2. Extensive atheromatous irregularity throughout the left ICA and MCA without large vessel occlusion. Moderate carotid siphon atherosclerosis without high-grade or correctable stenosis. 3. Extensive atheromatous irregularity throughout the vertebrobasilar system, with moderate to severe multifocal stenoses involving the basilar artery. 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 Distal PCA branches not well visualized, suspected to be occluded given the chronic bilateral PCA territory infarcts. 4. 3 mm focal outpouching arising at the confluence of the  right P1 segment and posterior communicating artery, suspicious for small aneurysm. Critical Value/emergent results were called by telephone at the time of interpretation on 06/25/2017 at 7:47 pm to Dr. Ferd Glassing , who verbally acknowledged these results. Electronically Signed   By: Rise Mu M.D.   On: 06/25/2017 20:00   Dg Chest 2 View  Result  Date: 06/26/2017 CLINICAL DATA:  Acute onset of shortness of breath. Status post CVA. EXAM: CHEST  2 VIEW COMPARISON:  Chest radiograph performed 06/11/2017 FINDINGS: The lungs are well-aerated and clear. There is no evidence of focal opacification, pleural effusion or pneumothorax. The heart is borderline normal in size. The patient is status post median sternotomy, with evidence of prior CABG. No acute osseous abnormalities are seen. IMPRESSION: No acute cardiopulmonary process seen. Electronically Signed   By: Roanna Raider M.D.   On: 06/26/2017 02:51   Ct Angio Neck W Or Wo Contrast  Result Date: 06/25/2017 CLINICAL DATA:  Initial evaluation for follow-up stroke. EXAM: CT ANGIOGRAPHY HEAD AND NECK TECHNIQUE: Multidetector CT imaging of the head and neck was performed using the standard protocol during bolus administration of intravenous contrast. Multiplanar CT image reconstructions and MIPs were obtained to evaluate the vascular anatomy. Carotid stenosis measurements (when applicable) are obtained utilizing NASCET criteria, using the distal internal carotid diameter as the denominator. CONTRAST:  75mL ISOVUE-370 IOPAMIDOL (ISOVUE-370) INJECTION 76% COMPARISON:  Prior head CT from earlier the same day. FINDINGS: CTA NECK FINDINGS Aortic arch: Extensive atheromatous plaque throughout the visualized aortic arch. No high-grade stenosis about the origin of the great vessels. Extensive atheromatous regularity throughout the subclavian artery is without high-grade stenosis. Right carotid system: Right common carotid artery is occluded at its mid aspect to the level of the bifurcation. Right ICA entirely included within the neck to the level of the terminus. Opacification of the right external carotid artery and its branches via collateral flow. Left carotid system: Left common carotid artery demonstrates multifocal atheromatous irregularity but is patent from its origin to the bifurcation without flow-limiting  stenosis. No significant atheromatous narrowing about the left bifurcation. Left ICA patent within the neck without flow-limiting stenosis. Vertebral arteries: Both of the vertebral artery is arise from the subclavian artery is. Left vertebral artery dominant. Right vertebral artery diffusely hypoplastic. Atheromatous plaque at the origin of the vertebral arteries bilaterally with moderate to severe stenoses. Extensive multifocal atheromatous irregularity throughout the vertebral artery is with resultant mild to moderate multifocal stenoses. Right vertebral artery occludes at the cranial vault. Skeleton: No acute osseus abnormality. No worrisome lytic or blastic osseous lesions. Advanced degenerative spondylolysis throughout the cervical spine with bulky anterior osteophytic spurring. Other neck: No acute soft tissue abnormality within the neck. Salivary glands within normal limits. Thyroid normal. No adenopathy. Upper chest: Median sternotomy wires partially visualized. No acute abnormality within the visualized upper chest. Emphysema. Review of the MIP images confirms the above findings CTA HEAD FINDINGS Anterior circulation: The right ICA occluded to the level of the terminus. Multifocal plaque within the petrous left ICA with mild narrowing. Advanced atheromatous plaque throughout the cavernous/ supraclinoid left ICA with resultant moderate multifocal irregularity and narrowing. Left ICA terminus widely patent. A1 segments widely patent without stenosis. Patent anterior communicating artery. Anterior cerebral artery is widely patent to their distal aspects without flow-limiting stenosis. Perfusion of the right ICA terminus via flow cross the circle of Willis. Right M1 segment patent without stenosis. Left M1 segment patent as well. Normal MCA bifurcations. No proximal M2 occlusion. Distal  MCA branches well perfused and symmetric. Posterior circulation: Left vertebral artery dominant. Multifocal atheromatous  plaque throughout the left V4 segment with moderate multifocal narrowing. Left PICA patent. Hypoplastic right vertebral artery occluded at the cranial vault. Right PICA not visualized. Extensive multifocal atheromatous regularity throughout the basilar artery with moderate to severe stenoses, most notable at its distal aspect at the level of the basilar tip (series 8, image 97). Superior cerebral arteries patent bilaterally. Left PCA supplied via the basilar artery. Right PCA supplied via a hypoplastic right P1 segment as well as a right posterior communicating artery. 3 mm ovoid blush of contrast at the confluence of the right P1 segment and right P com suspicious for possible small aneurysm (series 8, image 97). Extensive atheromatous irregularity throughout the P2 segments bilaterally the without high-grade stenosis. Distal PCAs not well visualized, and may be occluded given the chronic bilateral PCA territory infarcts. Venous sinuses: Centrally positioned and linear filling defect involving the posterior aspect of the superior sagittal sinus is not seen on 5 minutes delayed sequence, and is favored to reflect a flow related artifact. No evidence for dural sinus thrombosis. Anatomic variants: None significant. Delayed phase: No abnormal enhancement. Review of the MIP images confirms the above findings IMPRESSION: 1. Occlusion of the right common carotid artery at its mid aspect, age indeterminate, and may be chronic given at this would not fit with patient's current symptoms. Right carotid artery system is occluded to the level of the circle of Willis. Distal reconstitution at the level of the right ICA terminus with fairly robust flow throughout the right MCA territory. 2. Extensive atheromatous irregularity throughout the left ICA and MCA without large vessel occlusion. Moderate carotid siphon atherosclerosis without high-grade or correctable stenosis. 3. Extensive atheromatous irregularity throughout the  vertebrobasilar system, with moderate to severe multifocal stenoses involving the basilar artery. 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 Distal PCA branches not well visualized, suspected to be occluded given the chronic bilateral PCA territory infarcts. 4. 3 mm focal outpouching arising at the confluence of the right P1 segment and posterior communicating artery, suspicious for small aneurysm. Critical Value/emergent results were called by telephone at the time of interpretation on 06/25/2017 at 7:47 pm to Dr. Ferd Glassing , who verbally acknowledged these results. Electronically Signed   By: Rise Mu M.D.   On: 06/25/2017 20:00   Ct Cervical Spine Wo Contrast  Result Date: 06/25/2017 CLINICAL DATA:  Acute onset of slurred speech and right-sided facial droop. Last seen normal 9 hours ago. EXAM: CT HEAD WITHOUT CONTRAST CT CERVICAL SPINE WITHOUT CONTRAST TECHNIQUE: Multidetector CT imaging of the head and cervical spine was performed following the standard protocol without intravenous contrast. Multiplanar CT image reconstructions of the cervical spine were also generated. COMPARISON:  CT head without contrast 02/06/2016. FINDINGS: CT HEAD FINDINGS Brain: Remote encephalomalacia of the left occipital lobe known bilateral parietal lobes is stable. Remote lacunar infarcts of the basal ganglia are stable bilaterally. Remote lacunar infarct is again seen in the right thalamus. No acute cortical infarct, hemorrhage, or mass lesion is present. The insular ribbon is intact bilaterally. The basal ganglia are stable. The brainstem and cerebellum are within normal limits. Vascular: Dense calcifications are present within the cavernous internal carotid arteries in the left vertebral artery. There is no hyperdense vessel. Skull: Calvarium is intact. No focal lytic or blastic lesions are present. Sinuses/Orbits: Remote left orbital blowout fracture is stable. The paranasal  sinuses and mastoid air cells are otherwise  within normal limits. Globes and orbits are otherwise normal. CT CERVICAL SPINE FINDINGS Alignment: Grade 1 anterolisthesis at C2-3 is degenerative. AP alignment is otherwise anatomic. Skull base and vertebrae: The craniocervical junction is normal. Marked degenerative changes are again seen at C1-2. Vertebral body heights are normal. No acute or healing fracture is present. Soft tissues and spinal canal: Vascular calcifications are present bilaterally, most prominent at the right carotid bifurcation. There are calcifications along the left vertebral artery. These stable. Disc levels: Central canal stenosis is most severe at C3-4 with prominent posterior calcifications narrow the canal. Foraminal narrowing is also greatest at C3-4 without significant change. There is left-sided foraminal disease at C5-6 and C6-7 similar the prior study as well. Upper chest: The lung apices are clear. IMPRESSION: 1. No acute intracranial abnormality or significant interval change. 2. Aspects 10/10 3. 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 4. Stable remote lacunar infarcts of the basal ganglia. 5. Extensive vascular calcifications. 6. Stable multilevel spondylosis cervical spine without acute abnormality. 7.  Aortic Atherosclerosis (ICD10-I70.0). These results were called by telephone at the time of interpretation on 06/25/2017 at 6:10 pm to Dr. Bethann Berkshire , who verbally acknowledged these results. Electronically Signed   By: Marin Roberts M.D.   On: 06/25/2017 18:10   Ct Head Code Stroke Wo Contrast  Result Date: 06/25/2017 CLINICAL DATA:  Acute onset of slurred speech and right-sided facial droop. Last seen normal 9 hours ago. EXAM: CT HEAD WITHOUT CONTRAST CT CERVICAL SPINE WITHOUT CONTRAST TECHNIQUE: Multidetector CT imaging of the head and cervical spine was performed following the standard protocol without intravenous  contrast. Multiplanar CT image reconstructions of the cervical spine were also generated. COMPARISON:  CT head without contrast 02/06/2016. FINDINGS: CT HEAD FINDINGS Brain: Remote encephalomalacia of the left occipital lobe known bilateral parietal lobes is stable. Remote lacunar infarcts of the basal ganglia are stable bilaterally. Remote lacunar infarct is again seen in the right thalamus. No acute cortical infarct, hemorrhage, or mass lesion is present. The insular ribbon is intact bilaterally. The basal ganglia are stable. The brainstem and cerebellum are within normal limits. Vascular: Dense calcifications are present within the cavernous internal carotid arteries in the left vertebral artery. There is no hyperdense vessel. Skull: Calvarium is intact. No focal lytic or blastic lesions are present. Sinuses/Orbits: Remote left orbital blowout fracture is stable. The paranasal sinuses and mastoid air cells are otherwise within normal limits. Globes and orbits are otherwise normal. CT CERVICAL SPINE FINDINGS Alignment: Grade 1 anterolisthesis at C2-3 is degenerative. AP alignment is otherwise anatomic. Skull base and vertebrae: The craniocervical junction is normal. Marked degenerative changes are again seen at C1-2. Vertebral body heights are normal. No acute or healing fracture is present. Soft tissues and spinal canal: Vascular calcifications are present bilaterally, most prominent at the right carotid bifurcation. There are calcifications along the left vertebral artery. These stable. Disc levels: Central canal stenosis is most severe at C3-4 with prominent posterior calcifications narrow the canal. Foraminal narrowing is also greatest at C3-4 without significant change. There is left-sided foraminal disease at C5-6 and C6-7 similar the prior study as well. Upper chest: The lung apices are clear. IMPRESSION: 1. No acute intracranial abnormality or significant interval change. 2. Aspects 10/10 3. Stable remote  infarct involving the left occipital lobe and bilateral parietal lobes. 4. Stable remote lacunar infarcts of the basal ganglia. 5. Extensive vascular calcifications. 6. Stable multilevel spondylosis cervical spine without acute abnormality. 7.  Aortic Atherosclerosis (ICD10-I70.0). These results were called by telephone at the time of interpretation on 06/25/2017 at 6:10 pm to Dr. Bethann Berkshire , who verbally acknowledged these results. Electronically Signed   By: Marin Roberts M.D.   On: 06/25/2017 18:10    Scheduled Meds: . enoxaparin (LOVENOX) injection  40 mg Subcutaneous Q24H  . Warfarin - Pharmacist Dosing Inpatient   Does not apply q1800   Continuous Infusions: . sodium chloride 65 mL/hr at 06/26/17 0223     LOS: 1 day   Time spent: 25 minutes.  [place] Junker, MD [facility] Pager [phone]  If 7PM-7AM, please contact [email or web address] [email or web address] Password [email or web address] 06/26/2017, 10:04 AM

## 2017-06-26 NOTE — ED Notes (Signed)
ED TO INPATIENT HANDOFF REPORT  Name/Age/Gender Raelyn Ensign 77 y.o. male  Code Status Code Status History    Date Active Date Inactive Code Status Order ID Comments User Context   03/02/2013 16:07 03/04/2013 14:15 Full Code [id number]  Isaiah Serge, NP Inpatient   01/22/2012 15:46 01/29/2012 21:52 Full Code [id number]  Kathaleen Grinder, RN Inpatient   01/14/2012 02:27 01/22/2012 15:46 Full Code [id number]  Wynona Canes, RN Inpatient    Advance Directive Documentation     Most Recent Value  Type of Advance Directive  Healthcare Power of Attorney  Pre-existing out of facility DNR order (yellow form or pink MOST form)  No data  "MOST" Form in Place?  No data      Home/SNF/Other Home  Chief Complaint possible stroke   Level of Care/Admitting Diagnosis ED Disposition    ED Disposition Condition Comment   Admit  Hospital Area: [facility] [[id number]]  Level of Care: Telemetry [5]  Diagnosis: Acute CVA (cerebrovascular accident) [facility]) [[id number]]  Admitting Physician: [place], [place]  Attending Physician: Roney Jaffe [[id number]]  Estimated length of stay: past midnight tomorrow  Certification:: I certify this patient will need inpatient services for at least 2 midnights  PT Class (Do Not Modify): Inpatient [101]  PT Acc Code (Do Not Modify): Private [1]       Medical History Past Medical History:  Diagnosis Date  . Atrial fibrillation ([place])    perioperative  . CAD (coronary artery disease)   . Carotid stenosis    bilateral  . COPD (chronic obstructive pulmonary disease) ([place])   . Critical lower limb ischemia   . Diabetes mellitus   . DM (diabetes mellitus) ([place]) 01/15/2012  . Dyslipidemia   . H/O ascending aorta repair   . Hematuria 01/15/2012  . Hypertension   . Ischemic cardiomyopathy   . Left ventricular apical thrombus following MI, NSTEMI, 01/13/12 01/15/2012  . LV (left ventricular) mural thrombus    inferapical  . S/P CABG x 3 01/22/2012   LIMA to  LAD,SVG to second diagonal,SVG to distal RCA  . Staghorn renal calculus 01/15/2012  . Stroke ([place])   . Thrombocytopenia ([place])     Allergies No Known Allergies  IV Location/Drains/Wounds Patient Lines/Drains/Airways Status   Active Line/Drains/Airways    Name:   Placement date:   Placement time:   Site:   Days:   Peripheral IV 06/25/17 Right Forearm   06/25/17    1753    Forearm   1   Wound 03/02/13 Other (Comment) Toe (Comment  which one) Left dry, round, hard   03/02/13    2000    Toe (Comment  which one)   1577   Wound / Incision (Open or Dehisced) 06/26/17 Other (Comment) Knee Left ABRASION   06/26/17    1328    Knee   less than 1          Labs/Imaging Results for orders placed or performed during the hospital encounter of 06/25/17 (from the past 48 hour(s))  CBG monitoring, ED     Status: Abnormal   Collection Time: 06/25/17  5:41 PM  Result Value Ref Range   Glucose-Capillary 111 (H) 65 - 99 mg/dL  Ethanol     Status: None   Collection Time: 06/25/17  5:49 PM  Result Value Ref Range   Alcohol, Ethyl (B) <10 <10 mg/dL    Comment:        LOWEST DETECTABLE LIMIT FOR SERUM ALCOHOL IS  10 mg/dL FOR MEDICAL PURPOSES ONLY   Protime-INR     Status: None   Collection Time: 06/25/17  5:49 PM  Result Value Ref Range   Prothrombin Time 13.9 11.4 - 15.2 seconds   INR 1.08   APTT     Status: None   Collection Time: 06/25/17  5:49 PM  Result Value Ref Range   aPTT 28 24 - 36 seconds  CBC     Status: None   Collection Time: 06/25/17  5:49 PM  Result Value Ref Range   WBC 8.5 4.0 - 10.5 K/uL   RBC 4.64 4.22 - 5.81 MIL/uL   Hemoglobin 15.6 13.0 - 17.0 g/dL   HCT 44.9 39.0 - 52.0 %   MCV 96.8 78.0 - 100.0 fL   MCH 33.6 26.0 - 34.0 pg   MCHC 34.7 30.0 - 36.0 g/dL   RDW 14.2 11.5 - 15.5 %   Platelets 172 150 - 400 K/uL  Differential     Status: None   Collection Time: 06/25/17  5:49 PM  Result Value Ref Range   Neutrophils Relative % 71 %   Neutro Abs 6.1 1.7 - 7.7 K/uL    Lymphocytes Relative 22 %   Lymphs Abs 1.9 0.7 - 4.0 K/uL   Monocytes Relative 3 %   Monocytes Absolute 0.3 0.1 - 1.0 K/uL   Eosinophils Relative 3 %   Eosinophils Absolute 0.2 0.0 - 0.7 K/uL   Basophils Relative 1 %   Basophils Absolute 0.0 0.0 - 0.1 K/uL  Comprehensive metabolic panel     Status: Abnormal   Collection Time: 06/25/17  5:49 PM  Result Value Ref Range   Sodium 141 135 - 145 mmol/L   Potassium 4.5 3.5 - 5.1 mmol/L   Chloride 109 101 - 111 mmol/L   CO2 25 22 - 32 mmol/L   Glucose, Bld 113 (H) 65 - 99 mg/dL   BUN 13 6 - 20 mg/dL   Creatinine, Ser 1.08 0.61 - 1.24 mg/dL   Calcium 9.1 8.9 - 10.3 mg/dL   Total Protein 6.9 6.5 - 8.1 g/dL   Albumin 4.1 3.5 - 5.0 g/dL   AST 19 15 - 41 U/L   ALT 11 (L) 17 - 63 U/L   Alkaline Phosphatase 80 38 - 126 U/L   Total Bilirubin 0.8 0.3 - 1.2 mg/dL   GFR calc non Af Amer >60 >60 mL/min   GFR calc Af Amer >60 >60 mL/min    Comment: (NOTE) The eGFR has been calculated using the CKD EPI equation. This calculation has not been validated in all clinical situations. eGFR's persistently <60 mL/min signify possible Chronic Kidney Disease.    Anion gap 7 5 - 15  I-stat troponin, ED     Status: None   Collection Time: 06/25/17  6:01 PM  Result Value Ref Range   Troponin i, poc 0.04 0.00 - 0.08 ng/mL   Comment 3            Comment: Due to the release kinetics of cTnI, a negative result within the first hours of the onset of symptoms does not rule out myocardial infarction with certainty. If myocardial infarction is still suspected, repeat the test at appropriate intervals.   I-Stat Chem 8, ED     Status: Abnormal   Collection Time: 06/25/17  6:02 PM  Result Value Ref Range   Sodium 141 135 - 145 mmol/L   Potassium 4.5 3.5 - 5.1 mmol/L   Chloride 106 101 -  111 mmol/L   BUN 12 6 - 20 mg/dL   Creatinine, Ser 1.00 0.61 - 1.24 mg/dL   Glucose, Bld 112 (H) 65 - 99 mg/dL   Calcium, Ion 1.11 (L) 1.15 - 1.40 mmol/L   TCO2 23 22 - 32  mmol/L   Hemoglobin 15.0 13.0 - 17.0 g/dL   HCT 44.0 39.0 - 52.0 %  Urine rapid drug screen (hosp performed)     Status: None   Collection Time: 06/25/17  7:00 PM  Result Value Ref Range   Opiates NONE DETECTED NONE DETECTED   Cocaine NONE DETECTED NONE DETECTED   Benzodiazepines NONE DETECTED NONE DETECTED   Amphetamines NONE DETECTED NONE DETECTED   Tetrahydrocannabinol NONE DETECTED NONE DETECTED   Barbiturates NONE DETECTED NONE DETECTED    Comment: (NOTE) DRUG SCREEN FOR MEDICAL PURPOSES ONLY.  IF CONFIRMATION IS NEEDED FOR ANY PURPOSE, NOTIFY LAB WITHIN 5 DAYS. LOWEST DETECTABLE LIMITS FOR URINE DRUG SCREEN Drug Class                     Cutoff (ng/mL) Amphetamine and metabolites    1000 Barbiturate and metabolites    200 Benzodiazepine                 124 Tricyclics and metabolites     300 Opiates and metabolites        300 Cocaine and metabolites        300 THC                            50   Urinalysis, Routine w reflex microscopic     Status: Abnormal   Collection Time: 06/25/17  7:00 PM  Result Value Ref Range   Color, Urine YELLOW YELLOW   APPearance HAZY (A) CLEAR   Specific Gravity, Urine 1.021 1.005 - 1.030   pH 7.0 5.0 - 8.0   Glucose, UA NEGATIVE NEGATIVE mg/dL   Hgb urine dipstick SMALL (A) NEGATIVE   Bilirubin Urine NEGATIVE NEGATIVE   Ketones, ur NEGATIVE NEGATIVE mg/dL   Protein, ur NEGATIVE NEGATIVE mg/dL   Nitrite NEGATIVE NEGATIVE   Leukocytes, UA NEGATIVE NEGATIVE   RBC / HPF 6-30 0 - 5 RBC/hpf   WBC, UA 6-30 0 - 5 WBC/hpf   Bacteria, UA NONE SEEN NONE SEEN   Squamous Epithelial / LPF 0-5 (A) NONE SEEN   Mucus PRESENT   Hemoglobin A1c     Status: None   Collection Time: 06/26/17  5:00 AM  Result Value Ref Range   Hgb A1c MFr Bld 4.9 4.8 - 5.6 %    Comment: (NOTE) Pre diabetes:          5.7%-6.4% Diabetes:              >6.4% Glycemic control for   <7.0% adults with diabetes    Mean Plasma Glucose 93.93 mg/dL    Comment: Performed at  [facility], [place] [address], [place], [place] [address]  Lipid panel     Status: Abnormal   Collection Time: 06/26/17  5:00 AM  Result Value Ref Range   Cholesterol 176 0 - 200 mg/dL   Triglycerides 98 <150 mg/dL   HDL 39 (L) >40 mg/dL   Total CHOL/HDL Ratio 4.5 RATIO   VLDL 20 0 - 40 mg/dL   LDL Cholesterol 117 (H) 0 - 99 mg/dL    Comment:        Total  Cholesterol/HDL:CHD Risk Coronary Heart Disease Risk Table                     Men   Women  1/2 Average Risk   3.4   3.3  Average Risk       5.0   4.4  2 X Average Risk   9.6   7.1  3 X Average Risk  23.4   11.0        Use the calculated Patient Ratio above and the CHD Risk Table to determine the patient's CHD Risk.        ATP III CLASSIFICATION (LDL):  <100     mg/dL   Optimal  100-129  mg/dL   Near or Above                    Optimal  130-159  mg/dL   Borderline  160-189  mg/dL   High  >190     mg/dL   Very High    Ct Angio Head W Or Wo Contrast  Result Date: 06/25/2017 CLINICAL DATA:  Initial evaluation for follow-up stroke. EXAM: CT ANGIOGRAPHY HEAD AND NECK TECHNIQUE: Multidetector CT imaging of the head and neck was performed using the standard protocol during bolus administration of intravenous contrast. Multiplanar CT image reconstructions and MIPs were obtained to evaluate the vascular anatomy. Carotid stenosis measurements (when applicable) are obtained utilizing NASCET criteria, using the distal internal carotid diameter as the denominator. CONTRAST:  32m ISOVUE-370 IOPAMIDOL (ISOVUE-370) INJECTION 76% COMPARISON:  Prior head CT from earlier the same day. FINDINGS: CTA NECK FINDINGS Aortic arch: Extensive atheromatous plaque throughout the visualized aortic arch. No high-grade stenosis about the origin of the great vessels. Extensive atheromatous regularity throughout the subclavian artery is without high-grade stenosis. Right carotid system: Right common carotid artery is occluded at its mid aspect to the level  of the bifurcation. Right ICA entirely included within the neck to the level of the terminus. Opacification of the right external carotid artery and its branches via collateral flow. Left carotid system: Left common carotid artery demonstrates multifocal atheromatous irregularity but is patent from its origin to the bifurcation without flow-limiting stenosis. No significant atheromatous narrowing about the left bifurcation. Left ICA patent within the neck without flow-limiting stenosis. Vertebral arteries: Both of the vertebral artery is arise from the subclavian artery is. Left vertebral artery dominant. Right vertebral artery diffusely hypoplastic. Atheromatous plaque at the origin of the vertebral arteries bilaterally with moderate to severe stenoses. Extensive multifocal atheromatous irregularity throughout the vertebral artery is with resultant mild to moderate multifocal stenoses. Right vertebral artery occludes at the cranial vault. Skeleton: No acute osseus abnormality. No worrisome lytic or blastic osseous lesions. Advanced degenerative spondylolysis throughout the cervical spine with bulky anterior osteophytic spurring. Other neck: No acute soft tissue abnormality within the neck. Salivary glands within normal limits. Thyroid normal. No adenopathy. Upper chest: Median sternotomy wires partially visualized. No acute abnormality within the visualized upper chest. Emphysema. Review of the MIP images confirms the above findings CTA HEAD FINDINGS Anterior circulation: The right ICA occluded to the level of the terminus. Multifocal plaque within the petrous left ICA with mild narrowing. Advanced atheromatous plaque throughout the cavernous/ supraclinoid left ICA with resultant moderate multifocal irregularity and narrowing. Left ICA terminus widely patent. A1 segments widely patent without stenosis. Patent anterior communicating artery. Anterior cerebral artery is widely patent to their distal aspects without  flow-limiting stenosis. Perfusion of the right ICA terminus  via flow cross the circle of Willis. Right M1 segment patent without stenosis. Left M1 segment patent as well. Normal MCA bifurcations. No proximal M2 occlusion. Distal MCA branches well perfused and symmetric. Posterior circulation: Left vertebral artery dominant. Multifocal atheromatous plaque throughout the left V4 segment with moderate multifocal narrowing. Left PICA patent. Hypoplastic right vertebral artery occluded at the cranial vault. Right PICA not visualized. Extensive multifocal atheromatous regularity throughout the basilar artery with moderate to severe stenoses, most notable at its distal aspect at the level of the basilar tip (series 8, image 97). Superior cerebral arteries patent bilaterally. Left PCA supplied via the basilar artery. Right PCA supplied via a hypoplastic right P1 segment as well as a right posterior communicating artery. 3 mm ovoid blush of contrast at the confluence of the right P1 segment and right P com suspicious for possible small aneurysm (series 8, image 97). Extensive atheromatous irregularity throughout the P2 segments bilaterally the without high-grade stenosis. Distal PCAs not well visualized, and may be occluded given the chronic bilateral PCA territory infarcts. Venous sinuses: Centrally positioned and linear filling defect involving the posterior aspect of the superior sagittal sinus is not seen on 5 minutes delayed sequence, and is favored to reflect a flow related artifact. No evidence for dural sinus thrombosis. Anatomic variants: None significant. Delayed phase: No abnormal enhancement. Review of the MIP images confirms the above findings IMPRESSION: 1. Occlusion of the right common carotid artery at its mid aspect, age indeterminate, and may be chronic given at this would not fit with patient's current symptoms. Right carotid artery system is occluded to the level of the circle of Willis. Distal  reconstitution at the level of the right ICA terminus with fairly robust flow throughout the right MCA territory. 2. Extensive atheromatous irregularity throughout the left ICA and MCA without large vessel occlusion. Moderate carotid siphon atherosclerosis without high-grade or correctable stenosis. 3. Extensive atheromatous irregularity throughout the vertebrobasilar system, with moderate to severe multifocal stenoses involving the basilar artery. 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 Distal PCA branches not well visualized, suspected to be occluded given the chronic bilateral PCA territory infarcts. 4. 3 mm focal outpouching arising at the confluence of the right P1 segment and posterior communicating artery, suspicious for small aneurysm. Critical Value/emergent results were called by telephone at the time of interpretation on 06/25/2017 at 7:47 pm to Dr. Aleatha Borer , who verbally acknowledged these results. Electronically Signed   By: Jeannine Boga M.D.   On: 06/25/2017 20:00   Dg Chest 2 View  Result Date: 06/26/2017 CLINICAL DATA:  Acute onset of shortness of breath. Status post CVA. EXAM: CHEST  2 VIEW COMPARISON:  Chest radiograph performed 06/11/2017 FINDINGS: The lungs are well-aerated and clear. There is no evidence of focal opacification, pleural effusion or pneumothorax. The heart is borderline normal in size. The patient is status post median sternotomy, with evidence of prior CABG. No acute osseous abnormalities are seen. IMPRESSION: No acute cardiopulmonary process seen. Electronically Signed   By: Garald Balding M.D.   On: 06/26/2017 02:51   Ct Angio Neck W Or Wo Contrast  Result Date: 06/25/2017 CLINICAL DATA:  Initial evaluation for follow-up stroke. EXAM: CT ANGIOGRAPHY HEAD AND NECK TECHNIQUE: Multidetector CT imaging of the head and neck was performed using the standard protocol during bolus administration of intravenous contrast.  Multiplanar CT image reconstructions and MIPs were obtained to evaluate the vascular anatomy. Carotid stenosis measurements (when applicable) are obtained utilizing NASCET criteria,  using the distal internal carotid diameter as the denominator. CONTRAST:  16m ISOVUE-370 IOPAMIDOL (ISOVUE-370) INJECTION 76% COMPARISON:  Prior head CT from earlier the same day. FINDINGS: CTA NECK FINDINGS Aortic arch: Extensive atheromatous plaque throughout the visualized aortic arch. No high-grade stenosis about the origin of the great vessels. Extensive atheromatous regularity throughout the subclavian artery is without high-grade stenosis. Right carotid system: Right common carotid artery is occluded at its mid aspect to the level of the bifurcation. Right ICA entirely included within the neck to the level of the terminus. Opacification of the right external carotid artery and its branches via collateral flow. Left carotid system: Left common carotid artery demonstrates multifocal atheromatous irregularity but is patent from its origin to the bifurcation without flow-limiting stenosis. No significant atheromatous narrowing about the left bifurcation. Left ICA patent within the neck without flow-limiting stenosis. Vertebral arteries: Both of the vertebral artery is arise from the subclavian artery is. Left vertebral artery dominant. Right vertebral artery diffusely hypoplastic. Atheromatous plaque at the origin of the vertebral arteries bilaterally with moderate to severe stenoses. Extensive multifocal atheromatous irregularity throughout the vertebral artery is with resultant mild to moderate multifocal stenoses. Right vertebral artery occludes at the cranial vault. Skeleton: No acute osseus abnormality. No worrisome lytic or blastic osseous lesions. Advanced degenerative spondylolysis throughout the cervical spine with bulky anterior osteophytic spurring. Other neck: No acute soft tissue abnormality within the neck. Salivary  glands within normal limits. Thyroid normal. No adenopathy. Upper chest: Median sternotomy wires partially visualized. No acute abnormality within the visualized upper chest. Emphysema. Review of the MIP images confirms the above findings CTA HEAD FINDINGS Anterior circulation: The right ICA occluded to the level of the terminus. Multifocal plaque within the petrous left ICA with mild narrowing. Advanced atheromatous plaque throughout the cavernous/ supraclinoid left ICA with resultant moderate multifocal irregularity and narrowing. Left ICA terminus widely patent. A1 segments widely patent without stenosis. Patent anterior communicating artery. Anterior cerebral artery is widely patent to their distal aspects without flow-limiting stenosis. Perfusion of the right ICA terminus via flow cross the circle of Willis. Right M1 segment patent without stenosis. Left M1 segment patent as well. Normal MCA bifurcations. No proximal M2 occlusion. Distal MCA branches well perfused and symmetric. Posterior circulation: Left vertebral artery dominant. Multifocal atheromatous plaque throughout the left V4 segment with moderate multifocal narrowing. Left PICA patent. Hypoplastic right vertebral artery occluded at the cranial vault. Right PICA not visualized. Extensive multifocal atheromatous regularity throughout the basilar artery with moderate to severe stenoses, most notable at its distal aspect at the level of the basilar tip (series 8, image 97). Superior cerebral arteries patent bilaterally. Left PCA supplied via the basilar artery. Right PCA supplied via a hypoplastic right P1 segment as well as a right posterior communicating artery. 3 mm ovoid blush of contrast at the confluence of the right P1 segment and right P com suspicious for possible small aneurysm (series 8, image 97). Extensive atheromatous irregularity throughout the P2 segments bilaterally the without high-grade stenosis. Distal PCAs not well visualized, and  may be occluded given the chronic bilateral PCA territory infarcts. Venous sinuses: Centrally positioned and linear filling defect involving the posterior aspect of the superior sagittal sinus is not seen on 5 minutes delayed sequence, and is favored to reflect a flow related artifact. No evidence for dural sinus thrombosis. Anatomic variants: None significant. Delayed phase: No abnormal enhancement. Review of the MIP images confirms the above findings IMPRESSION: 1. Occlusion of the right common  carotid artery at its mid aspect, age indeterminate, and may be chronic given at this would not fit with patient's current symptoms. Right carotid artery system is occluded to the level of the circle of Willis. Distal reconstitution at the level of the right ICA terminus with fairly robust flow throughout the right MCA territory. 2. Extensive atheromatous irregularity throughout the left ICA and MCA without large vessel occlusion. Moderate carotid siphon atherosclerosis without high-grade or correctable stenosis. 3. Extensive atheromatous irregularity throughout the vertebrobasilar system, with moderate to severe multifocal stenoses involving the basilar artery. 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 Distal PCA branches not well visualized, suspected to be occluded given the chronic bilateral PCA territory infarcts. 4. 3 mm focal outpouching arising at the confluence of the right P1 segment and posterior communicating artery, suspicious for small aneurysm. Critical Value/emergent results were called by telephone at the time of interpretation on 06/25/2017 at 7:47 pm to Dr. Aleatha Borer , who verbally acknowledged these results. Electronically Signed   By: Jeannine Boga M.D.   On: 06/25/2017 20:00   Ct Cervical Spine Wo Contrast  Result Date: 06/25/2017 CLINICAL DATA:  Acute onset of slurred speech and right-sided facial droop. Last seen normal 9 hours ago. EXAM: CT HEAD  WITHOUT CONTRAST CT CERVICAL SPINE WITHOUT CONTRAST TECHNIQUE: Multidetector CT imaging of the head and cervical spine was performed following the standard protocol without intravenous contrast. Multiplanar CT image reconstructions of the cervical spine were also generated. COMPARISON:  CT head without contrast 02/06/2016. FINDINGS: CT HEAD FINDINGS Brain: Remote encephalomalacia of the left occipital lobe known bilateral parietal lobes is stable. Remote lacunar infarcts of the basal ganglia are stable bilaterally. Remote lacunar infarct is again seen in the right thalamus. No acute cortical infarct, hemorrhage, or mass lesion is present. The insular ribbon is intact bilaterally. The basal ganglia are stable. The brainstem and cerebellum are within normal limits. Vascular: Dense calcifications are present within the cavernous internal carotid arteries in the left vertebral artery. There is no hyperdense vessel. Skull: Calvarium is intact. No focal lytic or blastic lesions are present. Sinuses/Orbits: Remote left orbital blowout fracture is stable. The paranasal sinuses and mastoid air cells are otherwise within normal limits. Globes and orbits are otherwise normal. CT CERVICAL SPINE FINDINGS Alignment: Grade 1 anterolisthesis at C2-3 is degenerative. AP alignment is otherwise anatomic. Skull base and vertebrae: The craniocervical junction is normal. Marked degenerative changes are again seen at C1-2. Vertebral body heights are normal. No acute or healing fracture is present. Soft tissues and spinal canal: Vascular calcifications are present bilaterally, most prominent at the right carotid bifurcation. There are calcifications along the left vertebral artery. These stable. Disc levels: Central canal stenosis is most severe at C3-4 with prominent posterior calcifications narrow the canal. Foraminal narrowing is also greatest at C3-4 without significant change. There is left-sided foraminal disease at C5-6 and C6-7  similar the prior study as well. Upper chest: The lung apices are clear. IMPRESSION: 1. No acute intracranial abnormality or significant interval change. 2. Aspects 10/10 3. 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 4. Stable remote lacunar infarcts of the basal ganglia. 5. Extensive vascular calcifications. 6. Stable multilevel spondylosis cervical spine without acute abnormality. 7.  Aortic Atherosclerosis (ICD10-I70.0). These results were called by telephone at the time of interpretation on 06/25/2017 at 6:10 pm to Dr. Milton Ferguson , who verbally acknowledged these results. Electronically Signed   By: Wynetta Fines.D.  On: 06/25/2017 18:10   Ct Head Code Stroke Wo Contrast  Result Date: 06/25/2017 CLINICAL DATA:  Acute onset of slurred speech and right-sided facial droop. Last seen normal 9 hours ago. EXAM: CT HEAD WITHOUT CONTRAST CT CERVICAL SPINE WITHOUT CONTRAST TECHNIQUE: Multidetector CT imaging of the head and cervical spine was performed following the standard protocol without intravenous contrast. Multiplanar CT image reconstructions of the cervical spine were also generated. COMPARISON:  CT head without contrast 02/06/2016. FINDINGS: CT HEAD FINDINGS Brain: Remote encephalomalacia of the left occipital lobe known bilateral parietal lobes is stable. Remote lacunar infarcts of the basal ganglia are stable bilaterally. Remote lacunar infarct is again seen in the right thalamus. No acute cortical infarct, hemorrhage, or mass lesion is present. The insular ribbon is intact bilaterally. The basal ganglia are stable. The brainstem and cerebellum are within normal limits. Vascular: Dense calcifications are present within the cavernous internal carotid arteries in the left vertebral artery. There is no hyperdense vessel. Skull: Calvarium is intact. No focal lytic or blastic lesions are present. Sinuses/Orbits: Remote left orbital blowout fracture is  stable. The paranasal sinuses and mastoid air cells are otherwise within normal limits. Globes and orbits are otherwise normal. CT CERVICAL SPINE FINDINGS Alignment: Grade 1 anterolisthesis at C2-3 is degenerative. AP alignment is otherwise anatomic. Skull base and vertebrae: The craniocervical junction is normal. Marked degenerative changes are again seen at C1-2. Vertebral body heights are normal. No acute or healing fracture is present. Soft tissues and spinal canal: Vascular calcifications are present bilaterally, most prominent at the right carotid bifurcation. There are calcifications along the left vertebral artery. These stable. Disc levels: Central canal stenosis is most severe at C3-4 with prominent posterior calcifications narrow the canal. Foraminal narrowing is also greatest at C3-4 without significant change. There is left-sided foraminal disease at C5-6 and C6-7 similar the prior study as well. Upper chest: The lung apices are clear. IMPRESSION: 1. No acute intracranial abnormality or significant interval change. 2. Aspects 10/10 3. 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 4. Stable remote lacunar infarcts of the basal ganglia. 5. Extensive vascular calcifications. 6. Stable multilevel spondylosis cervical spine without acute abnormality. 7.  Aortic Atherosclerosis (ICD10-I70.0). These results were called by telephone at the time of interpretation on 06/25/2017 at 6:10 pm to Dr. Milton Ferguson , who verbally acknowledged these results. Electronically Signed   By: San Morelle M.D.   On: 06/25/2017 18:10    Pending Labs Unresulted Labs (From admission, onward)   Start     Ordered   07/02/17 0500  Creatinine, serum  (enoxaparin (LOVENOX)    CrCl >/= 30 ml/min)  Weekly,   R    Comments:  while on enoxaparin therapy    06/26/17 0216   06/27/17 0500  Protime-INR  Daily,   R     06/26/17 0411      Vitals/Pain Today's Vitals   06/26/17 1235  06/26/17 1240 06/26/17 1300 06/26/17 1313  BP: (!) 142/61  (!) 156/59   Pulse: 77 71 73 80  Resp: 20 (!) 23  18  Temp:    98 F (36.7 C)  TempSrc:    Oral  SpO2: 95%   100%  PainSc:        Isolation Precautions No active isolations  Medications Medications  iopamidol (ISOVUE-370) 76 % injection (not administered)  enoxaparin (LOVENOX) injection 40 mg (40 mg Subcutaneous Given 06/26/17 0245)  Warfarin - Pharmacist Dosing Inpatient ( Does not  apply Canceled Entry 06/26/17 0500)  RESOURCE THICKENUP CLEAR (not administered)  atorvastatin (LIPITOR) tablet 80 mg (not administered)  iopamidol (ISOVUE-370) 76 % injection 75 mL (75 mLs Intravenous Contrast Given 06/25/17 1837)  warfarin (COUMADIN) tablet 7.5 mg (7.5 mg Oral Given 06/26/17 0530)    Mobility walks

## 2017-06-26 NOTE — Progress Notes (Signed)
ANTICOAGULATION CONSULT NOTE  Pharmacy Consult for warfarin Indication: atrial fibrillation  No Known Allergies  Patient Measurements:     Vital Signs: Temp: 97.7 F (36.5 C) (12/31 1735) Temp Source: Oral (12/31 1735) BP: 145/68 (01/01 0200) Pulse Rate: 65 (01/01 0045)  Labs: Recent Labs    06/25/17 1749 06/25/17 1802  HGB 15.6 15.0  HCT 44.9 44.0  PLT 172  --   APTT 28  --   LABPROT 13.9  --   INR 1.08  --   CREATININE 1.08 1.00    CrCl cannot be calculated (Unknown ideal weight.).   Medical History: Past Medical History:  Diagnosis Date  . Atrial fibrillation (HCC)    perioperative  . CAD (coronary artery disease)   . Carotid stenosis    bilateral  . COPD (chronic obstructive pulmonary disease) (HCC)   . Critical lower limb ischemia   . Diabetes mellitus   . DM (diabetes mellitus) (HCC) 01/15/2012  . Dyslipidemia   . H/O ascending aorta repair   . Hematuria 01/15/2012  . Hypertension   . Ischemic cardiomyopathy   . Left ventricular apical thrombus following MI, NSTEMI, 01/13/12 01/15/2012  . LV (left ventricular) mural thrombus    inferapical  . S/P CABG x 3 01/22/2012   LIMA to LAD,SVG to second diagonal,SVG to distal RCA  . Staghorn renal calculus 01/15/2012  . Stroke (HCC)   . Thrombocytopenia (HCC)     Medications:   (Not in a hospital admission) Scheduled:  . enoxaparin (LOVENOX) injection  40 mg Subcutaneous Q24H  . Warfarin - Pharmacist Dosing Inpatient   Does not apply q1800    Assessment: 22 yoM with PMH Afib, severe PAD, prior CVA, CAD sp CABG, L CEA, HTN, prior aortobifemoral bypass, admitted for stroke Sx but not a TPA candidate. Supposed to be taking warfarin for AFib but does not appear to be and INR ~1.0. Plan is to [facility]xfer to [facility] for neuro workup; Neuro OK to have pharmacy resume warfarin while admitted, no bridging needed.   Baseline INR 1.08  Prior anticoagulation: warfarin 5 mg daily, LD unknown (not recent)  Significant  events:  Today, 06/26/2017:  CBC: wnl  INR subtherapeutic  Major drug interactions: none, but also on aspirin and ppx LMWH  No bleeding issues documented  NPO currently  Goal of Therapy: INR 2-3  Plan:  Warfarin 7.5 mg PO x 1  Daily INR  CBC at least q72 hr while on warfarin  Seems reasonable to continue ASA and Lovenox while INR subtherapeutic, would stop LMWH once INR > 2.0   Monitor for signs of bleeding or thrombosis   Bernadene Person, PharmD Pager: [phone] 06/26/2017, 6:48 AM

## 2017-06-26 NOTE — ED Notes (Signed)
[phone]  

## 2017-06-27 ENCOUNTER — Inpatient Hospital Stay (HOSPITAL_COMMUNITY): Payer: Medicare Other

## 2017-06-27 DIAGNOSIS — N183 Chronic kidney disease, stage 3 (moderate): Secondary | ICD-10-CM

## 2017-06-27 DIAGNOSIS — R1312 Dysphagia, oropharyngeal phase: Secondary | ICD-10-CM

## 2017-06-27 DIAGNOSIS — E782 Mixed hyperlipidemia: Secondary | ICD-10-CM

## 2017-06-27 DIAGNOSIS — I513 Intracardiac thrombosis, not elsewhere classified: Secondary | ICD-10-CM

## 2017-06-27 DIAGNOSIS — I63311 Cerebral infarction due to thrombosis of right middle cerebral artery: Secondary | ICD-10-CM

## 2017-06-27 DIAGNOSIS — I5042 Chronic combined systolic (congestive) and diastolic (congestive) heart failure: Secondary | ICD-10-CM

## 2017-06-27 DIAGNOSIS — Z9889 Other specified postprocedural states: Secondary | ICD-10-CM

## 2017-06-27 DIAGNOSIS — I25810 Atherosclerosis of coronary artery bypass graft(s) without angina pectoris: Secondary | ICD-10-CM

## 2017-06-27 DIAGNOSIS — I69391 Dysphagia following cerebral infarction: Secondary | ICD-10-CM

## 2017-06-27 DIAGNOSIS — I480 Paroxysmal atrial fibrillation: Secondary | ICD-10-CM

## 2017-06-27 DIAGNOSIS — Z7901 Long term (current) use of anticoagulants: Secondary | ICD-10-CM

## 2017-06-27 DIAGNOSIS — I10 Essential (primary) hypertension: Secondary | ICD-10-CM

## 2017-06-27 DIAGNOSIS — I2510 Atherosclerotic heart disease of native coronary artery without angina pectoris: Secondary | ICD-10-CM

## 2017-06-27 DIAGNOSIS — R471 Dysarthria and anarthria: Secondary | ICD-10-CM

## 2017-06-27 DIAGNOSIS — I63511 Cerebral infarction due to unspecified occlusion or stenosis of right middle cerebral artery: Secondary | ICD-10-CM

## 2017-06-27 DIAGNOSIS — I236 Thrombosis of atrium, auricular appendage, and ventricle as current complications following acute myocardial infarction: Secondary | ICD-10-CM

## 2017-06-27 DIAGNOSIS — R0682 Tachypnea, not elsewhere classified: Secondary | ICD-10-CM

## 2017-06-27 DIAGNOSIS — G8191 Hemiplegia, unspecified affecting right dominant side: Secondary | ICD-10-CM

## 2017-06-27 LAB — ECHOCARDIOGRAM COMPLETE
AVPHT: 411 ms
Ao-asc: 27 cm
Area-P 1/2: 3.49 cm2
CHL CUP MV DEC (S): 215
EWDT: 215 ms
FS: 24 % — AB (ref 28–44)
IV/PV OW: 0.67
LA ID, A-P, ES: 39 mm
LA diam index: 2.4 cm/m2
LA vol A4C: 36.9 ml
LA vol index: 24.3 mL/m2
LA vol: 39.6 mL
LEFT ATRIUM END SYS DIAM: 39 mm
LVOT area: 2.84 cm2
LVOTD: 19 mm
MV pk A vel: 70 m/s
MV pk E vel: 48.1 m/s
P 1/2 time: 63 ms
PW: 17.1 mm — AB (ref 0.6–1.1)
TAPSE: 12 mm
WEIGHTICAEL: 2014.12 [oz_av]

## 2017-06-27 LAB — PROTIME-INR
INR: 1.37
PROTHROMBIN TIME: 16.7 s — AB (ref 11.4–15.2)

## 2017-06-27 MED ORDER — SODIUM CHLORIDE 0.9 % IV SOLN
INTRAVENOUS | Status: AC
  Administered 2017-06-27 – 2017-06-28 (×2): via INTRAVENOUS

## 2017-06-27 MED ORDER — WARFARIN SODIUM 5 MG PO TABS
5.0000 mg | ORAL_TABLET | Freq: Once | ORAL | Status: AC
  Administered 2017-06-27: 5 mg via ORAL
  Filled 2017-06-27: qty 1

## 2017-06-27 NOTE — Evaluation (Signed)
Speech Language Pathology Evaluation Patient Details Name: Charles Hatfield MRN: [id number] DOB: 04/15/1941 Today's Date: 06/27/2017 Time: 6195-0932 SLP Time Calculation (min) (ACUTE ONLY): 25 min  Problem List:  Patient Active Problem List   Diagnosis Date Noted  . Right hemiparesis (HCC) 06/25/2017  . Dysarthria 06/25/2017  . Dysphagia 06/25/2017  . CVA (cerebral vascular accident) (HCC) 06/25/2017  . Long term (current) use of anticoagulants 06/25/2017  . Mixed hyperlipidemia 06/15/2013  . CKD (chronic kidney disease) stage 3, GFR 30-59 ml/min (HCC) 03/02/2013  . Critical lower limb ischemia 02/10/2013  . Preop cardiovascular exam 12/18/2012  . Long term current use of anticoagulant therapy 09/12/2012  . Occlusion and stenosis of carotid artery without mention of cerebral infarction 02/13/2012  . S/P CABG x 3     01/23/12 01/24/2012  . S/P carotid endarterectomy   Lt, 01/23/12 01/24/2012  . Carotid arterial disease, severe LICA stenosis 01/18/2012  . History of stroke 01/18/2012  . CAD (coronary artery disease) status post CABG July 2013 01/17/2012  . COPD (chronic obstructive pulmonary disease) (HCC) 01/17/2012  . Acute renal insufficiency, SCr up to 1.43 01/17/2012  . Ischemic cardiomyopathy, EF 40-45% 2D 01/15/12 01/17/2012  . Staghorn renal calculus 01/15/2012  . Left ventricular apical thrombus following MI, NSTEMI, 01/13/12 01/15/2012  . DM (diabetes mellitus) (HCC) 01/15/2012  . NSTEMI (non-ST elevated myocardial infarction) (HCC) 01/14/2012  . HTN (hypertension) 01/14/2012  . Thrombocytopenia (HCC) 01/14/2012  . Tobacco abuse 01/14/2012  . H/O ascending aorta repair    Past Medical History:  Past Medical History:  Diagnosis Date  . Atrial fibrillation (HCC)    perioperative  . CAD (coronary artery disease)   . Carotid stenosis    bilateral  . COPD (chronic obstructive pulmonary disease) (HCC)   . Critical lower limb ischemia   . Diabetes mellitus   . DM (diabetes  mellitus) (HCC) 01/15/2012  . Dyslipidemia   . H/O ascending aorta repair   . Hematuria 01/15/2012  . Hypertension   . Ischemic cardiomyopathy   . Left ventricular apical thrombus following MI, NSTEMI, 01/13/12 01/15/2012  . LV (left ventricular) mural thrombus    inferapical  . S/P CABG x 3 01/22/2012   LIMA to LAD,SVG to second diagonal,SVG to distal RCA  . Staghorn renal calculus 01/15/2012  . Stroke (HCC)   . Thrombocytopenia (HCC)    Past Surgical History:  Past Surgical History:  Procedure Laterality Date  . ABDOMINAL ANGIOGRAM  03/03/2013   Procedure: ABDOMINAL ANGIOGRAM;  Surgeon: Runell Gess, MD;  Location: [facility] CATH LAB;  Service: Cardiovascular;;  . CAROTID ENDARTERECTOMY  01/13/2012  . CORONARY ANGIOGRAM  01/16/2012   Procedure: CORONARY ANGIOGRAM;  Surgeon: Thurmon Fair, MD;  Location: [facility] CATH LAB;  Service: Cardiovascular;;  . CORONARY ARTERY BYPASS GRAFT  01/22/2012   Procedure: CORONARY ARTERY BYPASS GRAFTING (CABG);  Surgeon: Kerin Perna, MD;  Location[facility] OR;  Service: Open Heart Surgery;  Laterality: N/A;  CABG x three using left internal mammary artery and right leg greater saphenous vein harvested endoscopically  . ENDARTERECTOMY  01/22/2012   Procedure: ENDARTERECTOMY CAROTID;  Surgeon: Larina Earthly, MD;  Location: [facility] OR;  Service: Vascular;  Laterality: Left;  . LOWER EXTREMITY ANGIOGRAM Left 03/03/2013   Procedure: LOWER EXTREMITY ANGIOGRAM;  Surgeon: Runell Gess, MD;  Location[facility] CATH LAB;  Service: Cardiovascular;  Laterality: Left;  . NM MYOVIEW LTD  02/20/2012   infarct/scar w/mild perinfarct ischemia, EF 38%, abnormal nuc, no sign. ischemia  .  REPAIR THORACIC AORTA     Ascending thoracic aorto repair   HPI:  Patient is a 77 y.o. male with PMH: severe PAD, prior CVA, CAD s/p CABG, HTN, L CEA who presented to the ED with son, who reported patient with new onset slurred speech and right facial droop. Head CT did not reveal any acute abnormality, with  stable remote infarcts involving left occipital lobe, bilateral parietal lobes, lacunar infarcts of basal ganglia. Per MD note pt is living in a motel with his son, does not have adequate resources for pt to take medication regularly.    Assessment / Plan / Recommendation Clinical Impression  Pt demonstrates a severe dysarthria making pt minimally intelligible. Auditory comprehension appears in tact, but pt presents with some incorrect responses that raise concerns for memory and cognitive impairment, including impaired orientation. Pt struggles to provide clear history and does not utilize any complensatory strategies independently to improve his intelligibility, SLP provided instruction in use of gestures and head/nod/shake to increase listener comprehension with Y/N questions. Visual deficits also complicate function, which may be baseline. Would like to attempt writing with pt next session, but pt indicates he does not expect to be able to write. Will need ongoing training in total communication in acute setting, recommend CIR at d/c.     SLP Assessment  SLP Recommendation/Assessment: Patient needs continued Speech Lanaguage Pathology Services SLP Visit Diagnosis: Dysarthria and anarthria (R47.1)    Follow Up Recommendations  Skilled Nursing facility    Frequency and Duration min 2x/week  2 weeks      SLP Evaluation Cognition  Overall Cognitive Status: Impaired/Different from baseline Arousal/Alertness: Awake/alert Orientation Level: Oriented to person;Oriented to place;Disoriented to time Attention: Focused;Sustained Focused Attention: Appears intact Sustained Attention: Appears intact Memory: Impaired Memory Impairment: Decreased short term memory Decreased Short Term Memory: Verbal basic Problem Solving: Impaired Problem Solving Impairment: Verbal basic       Comprehension  Auditory Comprehension Overall Auditory Comprehension: Impaired Yes/No Questions: Impaired Basic  Biographical Questions: 76-100% accurate Basic Immediate Environment Questions: 25-49% accurate Commands: Within Functional Limits Conversation: Simple Interfering Components: Visual impairments Reading Comprehension Reading Status: Unable to assess (comment)(visual impairment)    Expression Expression Primary Mode of Expression: Verbal Verbal Expression Overall Verbal Expression: Appears within functional limits for tasks assessed   Oral / Motor  Oral Motor/Sensory Function Overall Oral Motor/Sensory Function: Severe impairment Facial ROM: Reduced right;Suspected CN VII (facial) dysfunction Facial Symmetry: Abnormal symmetry right;Suspected CN VII (facial) dysfunction Facial Strength: Reduced right;Suspected CN VII (facial) dysfunction Lingual ROM: Reduced right;Reduced left Lingual Strength: Reduced Motor Speech Overall Motor Speech: Impaired Respiration: Within functional limits Phonation: Normal Resonance: (impaired resonance) Articulation: Impaired Level of Impairment: Word Intelligibility: Intelligibility reduced Word: 25-49% accurate Phrase: 0-24% accurate Sentence: 0-24% accurate Conversation: 0-24% accurate Motor Planning: Witnin functional limits Motor Speech Errors: Aware;Consistent   GO                   Harlon Ditty, MA CCC-SLP [phone]  Claudine Mouton 06/27/2017, 2:47 PM

## 2017-06-27 NOTE — Evaluation (Signed)
Physical Therapy Evaluation Patient Details Name: Charles Hatfield MRN: [id number] DOB: [birth year] Today's Date: 06/27/2017   History of Present Illness  Patient is a 77 y/o male who presents with right facial droop and dysarthria. NIH 7. Unwitnessed fall at home and 3 week history of right sided visual deficits. MRI- acute infarct posterior limb of the internal capsule. CTA- occlusion right common carotid- maybe chronic. PMH includes CVA, PAD s/p aortobifemoral bypass and left CEA, intracardiac thrombus nonadherent to coumadin, CABG, HTN, DM, COPD, A-fib, CAD.   Clinical Impression  Patient presents with right sided weakness, dysarthria, increased secretions, impaired balance and mobility s/p above. Tolerated standing and taking a few steps to get to chair with Mod A for weight shifting, sequencing and balance. Pt fearful of falling and seems to exhibit difficulty with motor planning. Difficult to get history secondary to dysarthria but pt reports he has support of son PTA and uses [facility] for ambulation as needed. Would benefit from CIR to maximize independence and mobility prior to return home. Will follow acutely.     Follow Up Recommendations CIR    Equipment Recommendations  None recommended by PT    Recommendations for Other Services Rehab consult     Precautions / Restrictions Precautions Precautions: Fall Precaution Comments: increased secretions with difficulty managing them Restrictions Weight Bearing Restrictions: No      Mobility  Bed Mobility Overal bed mobility: Needs Assistance Bed Mobility: Rolling;Sidelying to Sit Rolling: Min guard Sidelying to sit: Mod assist;HOB elevated       General bed mobility comments: Able to initiate movement of LEs to EOB with some assist towards end, cues to reach for rail and assist to elevate trunk to get to EOB.   Transfers Overall transfer level: Needs assistance Equipment used: 1 person hand held assist Transfers: Sit to/from  Stand Sit to Stand: Min assist         General transfer comment: Assist to power to standing with cues for anterior weight shift with BLEs stabilizing against bed rail. Fearful of falling initially.  Ambulation/Gait Ambulation/Gait assistance: Mod assist Ambulation Distance (Feet): 3 Feet Assistive device: 1 person hand held assist Gait Pattern/deviations: Step-to pattern;Narrow base of support;Decreased stance time - right;Trunk flexed;Decreased weight shift to right Gait velocity: decreased Gait velocity interpretation: <1.8 ft/sec, indicative of risk for recurrent falls General Gait Details: Able to take a few steps to get to chair with assist for weight shifting to advance BLEs; some difficulty with motor planning and sequencing. Cues to widen BoS.  Stairs            Wheelchair Mobility    Modified Rankin (Stroke Patients Only) Modified Rankin (Stroke Patients Only) Pre-Morbid Rankin Score: Slight disability Modified Rankin: Moderately severe disability     Balance Overall balance assessment: Needs assistance Sitting-balance support: Feet supported;No upper extremity supported Sitting balance-Leahy Scale: Fair Sitting balance - Comments: Pt with posterior lean during MMT of BLEs but able to self correct with cues.    Standing balance support: During functional activity;Single extremity supported Standing balance-Leahy Scale: Poor Standing balance comment: Reliant on external support for static and dynamic standing balance.                             Pertinent Vitals/Pain Pain Assessment: No/denies pain    Home Living Family/patient expects to be discharged to:: Private residence(or hotel??) Living Arrangements: Children(son) Available Help at Discharge: Family;Available PRN/intermittently Type of Home: House Home  Access: Level entry     Home Layout: One level Home Equipment: Cane - single point      Prior Function Level of Independence:  Independent         Comments: Reports driving and using [facility] for ambulaton PTA as needed. Lives with son.     Hand Dominance   Dominant Hand: Right    Extremity/Trunk Assessment   Upper Extremity Assessment Upper Extremity Assessment: Defer to OT evaluation RUE Sensation: [facility])    Lower Extremity Assessment Lower Extremity Assessment: RLE deficits/detail RLE Deficits / Details: Grossly ~2+/5 ankle DF, knee extension/flexion, hip flexion. RLE Sensation: [facility]) RLE Coordination: decreased fine motor;decreased gross motor    Cervical / Trunk Assessment Cervical / Trunk Assessment: Kyphotic  Communication   Communication: Expressive difficulties(slurred speech)  Cognition Arousal/Alertness: Awake/alert Behavior During Therapy: WFL for tasks assessed/performed Overall Cognitive Status: Difficult to assess                                 General Comments: A&Ox4; just does not know the year but knows January. Follows multi step commands with repetition and increased time. Requires repetition for questions asked and increased time to respond most likely due to dysarthria.       General Comments General comments (skin integrity, edema, etc.): Pt with increased secretions throughout session.    Exercises     Assessment/Plan    PT Assessment Patient needs continued PT services  PT Problem List Decreased strength;Decreased balance;Decreased cognition;Decreased mobility;Decreased range of motion;Decreased activity tolerance       PT Treatment Interventions Functional mobility training;Balance training;DME instruction;Patient/family education;Gait training;Therapeutic activities;Neuromuscular re-education;Therapeutic exercise;Cognitive remediation    PT Goals (Current goals can be found in the Care Plan section)  Acute Rehab PT Goals Patient Stated Goal: to return to independence PT Goal Formulation: With patient Time For Goal Achievement: 07/11/17 Potential  to Achieve Goals: Fair    Frequency Min 4X/week   Barriers to discharge Decreased caregiver support not sure of level of support at home- RN wrote pt lives in hotel but pt reports living in house with son??    Co-evaluation               AM-PAC PT "6 Clicks" Daily Activity  Outcome Measure Difficulty turning over in bed (including adjusting bedclothes, sheets and blankets)?: None Difficulty moving from lying on back to sitting on the side of the bed? : Unable Difficulty sitting down on and standing up from a chair with arms (e.g., wheelchair, bedside commode, etc,.)?: Unable Help needed moving to and from a bed to chair (including a wheelchair)?: A Lot Help needed walking in hospital room?: A Lot Help needed climbing 3-5 steps with a railing? : Total 6 Click Score: 11    End of Session Equipment Utilized During Treatment: Gait belt Activity Tolerance: Patient tolerated treatment well Patient left: in chair;with call bell/phone within reach(no chair alarm pads left- RN aware) Nurse Communication: Mobility status;Other (comment)(transfer technique to get back to bed) PT Visit Diagnosis: Hemiplegia and hemiparesis;Difficulty in walking, not elsewhere classified (R26.2);Other symptoms and signs involving the nervous system (R29.898);History of falling (Z91.81) Hemiplegia - Right/Left: Right Hemiplegia - dominant/non-dominant: Dominant Hemiplegia - caused by: Cerebral infarction    Time: 1610-9604 PT Time Calculation (min) (ACUTE ONLY): 19 min   Charges:   PT Evaluation $PT Eval Low Complexity: 1 Low     PT G Codes:  Mylo Red, PT, DPT [phone]    Marcy Panning 06/27/2017, 9:53 AM

## 2017-06-27 NOTE — Consult Note (Signed)
Physical Medicine and Rehabilitation Consult Reason for Consult: Right side weakness and slurred speech Referring Physician: [facility]   HPI: Charles Hatfield is a 77 y.o.right handed male with history of CVA, left CEA, CAD with CABG,chronic systolic congestive heart failure,atrial fibrillation/ intracardiac thrombus noncompliant to Coumadin and hypertension.Per chart review patient lives with son. Uses a straight point cane for ambulation and still driving. One level home. Presented 06/25/2017 with right sided weakness and slurred speech. INR on admission of 1.08.Cranial CT unremarkable for acute process. Per report, stable remote infarct left occipital lobe and bilateral parietal lobes.CT angiogram head and neck showed occlusion of the right common carotid artery at its mid aspect age indeterminate. Extensive atheromatous irregularity throughout the left ICA and MCA without large vessel occlusion.3 mm focal outpouching arising at the confluence of the right P1 segment posterior communicating artery suspicious for small aneurysm. Patient did not receive TPA.MRI the brain reviewed, showing right internal capsule CVA. Per report, 2 cm acute ischemic nonhemorrhagic infarct involving the posterior limb of the right internal capsule.Echocardiogram pending.Chronic Coumadin ongoing.Currently maintained on a dysphagia 1 Honey thick liquid diet.Physical therapy evaluation completed 06/27/2016 with recommendations of physical medicine rehabilitation consult.   Review of Systems  Unable to perform ROS: Mental acuity   Past Medical History:  Diagnosis Date  . Atrial fibrillation (HCC)    perioperative  . CAD (coronary artery disease)   . Carotid stenosis    bilateral  . COPD (chronic obstructive pulmonary disease) (HCC)   . Critical lower limb ischemia   . Diabetes mellitus   . DM (diabetes mellitus) (HCC) 01/15/2012  . Dyslipidemia   . H/O ascending aorta repair   . Hematuria 01/15/2012  .  Hypertension   . Ischemic cardiomyopathy   . Left ventricular apical thrombus following MI, NSTEMI, 01/13/12 01/15/2012  . LV (left ventricular) mural thrombus    inferapical  . S/P CABG x 3 01/22/2012   LIMA to LAD,SVG to second diagonal,SVG to distal RCA  . Staghorn renal calculus 01/15/2012  . Stroke (HCC)   . Thrombocytopenia (HCC)    Past Surgical History:  Procedure Laterality Date  . ABDOMINAL ANGIOGRAM  03/03/2013   Procedure: ABDOMINAL ANGIOGRAM;  Surgeon: Runell Gess, MD;  Location: [facility] CATH LAB;  Service: Cardiovascular;;  . CAROTID ENDARTERECTOMY  01/13/2012  . CORONARY ANGIOGRAM  01/16/2012   Procedure: CORONARY ANGIOGRAM;  Surgeon: Thurmon Fair, MD;  Location: [facility] CATH LAB;  Service: Cardiovascular;;  . CORONARY ARTERY BYPASS GRAFT  01/22/2012   Procedure: CORONARY ARTERY BYPASS GRAFTING (CABG);  Surgeon: Kerin Perna, MD;  Location: [facility] OR;  Service: Open Heart Surgery;  Laterality: N/A;  CABG x three using left internal mammary artery and right leg greater saphenous vein harvested endoscopically  . ENDARTERECTOMY  01/22/2012   Procedure: ENDARTERECTOMY CAROTID;  Surgeon: Larina Earthly, MD;  Location: [facility] OR;  Service: Vascular;  Laterality: Left;  . LOWER EXTREMITY ANGIOGRAM Left 03/03/2013   Procedure: LOWER EXTREMITY ANGIOGRAM;  Surgeon: Runell Gess, MD;  Location: [facility] CATH LAB;  Service: Cardiovascular;  Laterality: Left;  . NM MYOVIEW LTD  02/20/2012   infarct/scar w/mild perinfarct ischemia, EF 38%, abnormal nuc, no sign. ischemia  . REPAIR THORACIC AORTA     Ascending thoracic aorto repair   Family History  Problem Relation Age of Onset  . Heart disease Father   . Heart attack Brother    Social History:  reports that he quit smoking about 5 years ago.  His smoking use included cigarettes. He smoked 1.00 pack per day. he has never used smokeless tobacco. He reports that he does not drink alcohol or use drugs. Allergies: No Known Allergies Medications Prior to  Admission  Medication Sig Dispense Refill  . acetaminophen (TYLENOL) 500 MG tablet Take 1,000 mg by mouth every 6 (six) hours as needed for pain.     Marland Kitchen aspirin EC 81 MG tablet Take 81 mg by mouth daily.    Marland Kitchen albuterol (PROVENTIL HFA;VENTOLIN HFA) 108 (90 BASE) MCG/ACT inhaler Inhale 2 puffs into the lungs every 6 (six) hours as needed for wheezing. (Patient not taking: Reported on 06/11/2017) 1 Inhaler 6  . atorvastatin (LIPITOR) 20 MG tablet TAKE ONE TABLET BY MOUTH ONCE DAILY (Patient not taking: Reported on 06/11/2017) 30 tablet 0  . ipratropium (ATROVENT HFA) 17 MCG/ACT inhaler Inhale 2 puffs into the lungs every 6 (six) hours as needed for wheezing. (Patient not taking: Reported on 06/11/2017) 1 Inhaler 0  . lisinopril (PRINIVIL,ZESTRIL) 5 MG tablet TAKE ONE TABLET BY MOUTH ONCE DAILY (Patient not taking: Reported on 06/11/2017) 30 tablet 0  . NITROSTAT 0.4 MG SL tablet DISSOLVE ONE TABLET UNDER THE TONGUE EVERY 5 MINUTES AS NEEDED FOR CHEST PAIN.  DO NOT EXCEED A TOTAL OF 3 DOSES IN 15 MINUTES NOW (Patient not taking: Reported on 06/11/2017) 25 tablet 2  . traMADol (ULTRAM) 50 MG tablet TAKE ONE TABLET BY MOUTH EVERY 6 HOURS AS NEEDED FOR MODERATE PAIN (Patient not taking: Reported on 02/06/2016) 60 tablet 0  . warfarin (COUMADIN) 5 MG tablet TAKE ONE TABLET BY MOUTH ONCE DAILY OR AS  DIRECTED  BY  COUMADIN  CLINIC (Patient not taking: Reported on 06/11/2017) 30 tablet 2    Home: Home Living Family/patient expects to be discharged to:: Private residence(or hotel??) Living Arrangements: Children(son) Available Help at Discharge: Family, Available PRN/intermittently Type of Home: House Home Access: Level entry Home Layout: One level Bathroom Toilet: Standard Home Equipment: Cane - single point  Functional History: Prior Function Level of Independence: Independent Comments: Reports driving and using SPC for ambulaton PTA as needed. Lives with son. Functional Status:  Mobility: Bed  Mobility Overal bed mobility: Needs Assistance Bed Mobility: Rolling, Sidelying to Sit Rolling: Min guard Sidelying to sit: Mod assist, HOB elevated General bed mobility comments: Able to initiate movement of LEs to EOB with some assist towards end, cues to reach for rail and assist to elevate trunk to get to EOB.  Transfers Overall transfer level: Needs assistance Equipment used: 1 person hand held assist Transfers: Sit to/from Stand Sit to Stand: Min assist General transfer comment: Assist to power to standing with cues for anterior weight shift with BLEs stabilizing against bed rail. Fearful of falling initially. Ambulation/Gait Ambulation/Gait assistance: Mod assist Ambulation Distance (Feet): 3 Feet Assistive device: 1 person hand held assist Gait Pattern/deviations: Step-to pattern, Narrow base of support, Decreased stance time - right, Trunk flexed, Decreased weight shift to right General Gait Details: Able to take a few steps to get to chair with assist for weight shifting to advance BLEs; some difficulty with motor planning and sequencing. Cues to widen BoS. Gait velocity: decreased Gait velocity interpretation: <1.8 ft/sec, indicative of risk for recurrent falls    ADL:    Cognition: Cognition Overall Cognitive Status: Difficult to assess Orientation Level: Oriented to person, Oriented to place, Disoriented to time, Oriented to situation Cognition Arousal/Alertness: Awake/alert Behavior During Therapy: WFL for tasks assessed/performed Overall Cognitive Status: Difficult to assess General  Comments: A&Ox4; just does not know the year but knows January. Follows multi step commands with repetition and increased time. Requires repetition for questions asked and increased time to respond most likely due to dysarthria.  Difficult to assess due to: Impaired communication  Blood pressure (!) 82/57, pulse 72, temperature 98 F (36.7 C), temperature source Oral, resp. rate (!) 26,  weight 57.1 kg (125 lb 14.1 oz), SpO2 96 %. Physical Exam  Vitals reviewed. Constitutional: He appears well-developed.  Frail  HENT:  Head: Normocephalic and atraumatic.  Eyes: EOM are normal. Right eye exhibits no discharge. Left eye exhibits no discharge.  Neck: Normal range of motion. Neck supple. No thyromegaly present.  Cardiovascular: Normal rate and regular rhythm.  Respiratory:  Fair inspiratory effort clear to auscultation  GI: Soft. Bowel sounds are normal. He exhibits no distension.  Musculoskeletal:  No edema or tenderness in extremities  Neurological:  Severe dysarthria.  Expressive asphasia >> Receptive aphasia Follows simple commands at times. Left inattention Nonverbal Motor, limited by participation: RLE: 2+/5 HF, KE, 4/5 ADF/PF LLE: HF, KE, ADF/PF 4-/5, ADF/PF 4+/5 Bl UE: ?4+/5 proximal to distal  Skin: Skin is warm and dry.  Psychiatric:  Unable to assess due to mentation    Results for orders placed or performed during the hospital encounter of 06/25/17 (from the past 24 hour(s))  Protime-INR     Status: Abnormal   Collection Time: 06/27/17  5:32 AM  Result Value Ref Range   Prothrombin Time 16.7 (H) 11.4 - 15.2 seconds   INR 1.37    Ct Angio Head W Or Wo Contrast  Result Date: 06/25/2017 CLINICAL DATA:  Initial evaluation for follow-up stroke. EXAM: CT ANGIOGRAPHY HEAD AND NECK TECHNIQUE: Multidetector CT imaging of the head and neck was performed using the standard protocol during bolus administration of intravenous contrast. Multiplanar CT image reconstructions and MIPs were obtained to evaluate the vascular anatomy. Carotid stenosis measurements (when applicable) are obtained utilizing NASCET criteria, using the distal internal carotid diameter as the denominator. CONTRAST:  75mL ISOVUE-370 IOPAMIDOL (ISOVUE-370) INJECTION 76% COMPARISON:  Prior head CT from earlier the same day. FINDINGS: CTA NECK FINDINGS Aortic arch: Extensive atheromatous plaque  throughout the visualized aortic arch. No high-grade stenosis about the origin of the great vessels. Extensive atheromatous regularity throughout the subclavian artery is without high-grade stenosis. Right carotid system: Right common carotid artery is occluded at its mid aspect to the level of the bifurcation. Right ICA entirely included within the neck to the level of the terminus. Opacification of the right external carotid artery and its branches via collateral flow. Left carotid system: Left common carotid artery demonstrates multifocal atheromatous irregularity but is patent from its origin to the bifurcation without flow-limiting stenosis. No significant atheromatous narrowing about the left bifurcation. Left ICA patent within the neck without flow-limiting stenosis. Vertebral arteries: Both of the vertebral artery is arise from the subclavian artery is. Left vertebral artery dominant. Right vertebral artery diffusely hypoplastic. Atheromatous plaque at the origin of the vertebral arteries bilaterally with moderate to severe stenoses. Extensive multifocal atheromatous irregularity throughout the vertebral artery is with resultant mild to moderate multifocal stenoses. Right vertebral artery occludes at the cranial vault. Skeleton: No acute osseus abnormality. No worrisome lytic or blastic osseous lesions. Advanced degenerative spondylolysis throughout the cervical spine with bulky anterior osteophytic spurring. Other neck: No acute soft tissue abnormality within the neck. Salivary glands within normal limits. Thyroid normal. No adenopathy. Upper chest: Median sternotomy wires partially visualized. No acute  abnormality within the visualized upper chest. Emphysema. Review of the MIP images confirms the above findings CTA HEAD FINDINGS Anterior circulation: The right ICA occluded to the level of the terminus. Multifocal plaque within the petrous left ICA with mild narrowing. Advanced atheromatous plaque  throughout the cavernous/ supraclinoid left ICA with resultant moderate multifocal irregularity and narrowing. Left ICA terminus widely patent. A1 segments widely patent without stenosis. Patent anterior communicating artery. Anterior cerebral artery is widely patent to their distal aspects without flow-limiting stenosis. Perfusion of the right ICA terminus via flow cross the circle of Willis. Right M1 segment patent without stenosis. Left M1 segment patent as well. Normal MCA bifurcations. No proximal M2 occlusion. Distal MCA branches well perfused and symmetric. Posterior circulation: Left vertebral artery dominant. Multifocal atheromatous plaque throughout the left V4 segment with moderate multifocal narrowing. Left PICA patent. Hypoplastic right vertebral artery occluded at the cranial vault. Right PICA not visualized. Extensive multifocal atheromatous regularity throughout the basilar artery with moderate to severe stenoses, most notable at its distal aspect at the level of the basilar tip (series 8, image 97). Superior cerebral arteries patent bilaterally. Left PCA supplied via the basilar artery. Right PCA supplied via a hypoplastic right P1 segment as well as a right posterior communicating artery. 3 mm ovoid blush of contrast at the confluence of the right P1 segment and right P com suspicious for possible small aneurysm (series 8, image 97). Extensive atheromatous irregularity throughout the P2 segments bilaterally the without high-grade stenosis. Distal PCAs not well visualized, and may be occluded given the chronic bilateral PCA territory infarcts. Venous sinuses: Centrally positioned and linear filling defect involving the posterior aspect of the superior sagittal sinus is not seen on 5 minutes delayed sequence, and is favored to reflect a flow related artifact. No evidence for dural sinus thrombosis. Anatomic variants: None significant. Delayed phase: No abnormal enhancement. Review of the MIP images  confirms the above findings IMPRESSION: 1. Occlusion of the right common carotid artery at its mid aspect, age indeterminate, and may be chronic given at this would not fit with patient's current symptoms. Right carotid artery system is occluded to the level of the circle of Willis. Distal reconstitution at the level of the right ICA terminus with fairly robust flow throughout the right MCA territory. 2. Extensive atheromatous irregularity throughout the left ICA and MCA without large vessel occlusion. Moderate carotid siphon atherosclerosis without high-grade or correctable stenosis. 3. Extensive atheromatous irregularity throughout the vertebrobasilar system, with moderate to severe multifocal stenoses involving the basilar artery. 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 Distal PCA branches not well visualized, suspected to be occluded given the chronic bilateral PCA territory infarcts. 4. 3 mm focal outpouching arising at the confluence of the right P1 segment and posterior communicating artery, suspicious for small aneurysm. Critical Value/emergent results were called by telephone at the time of interpretation on 06/25/2017 at 7:47 pm to Dr. Ferd Glassing , who verbally acknowledged these results. Electronically Signed   By: Rise Mu M.D.   On: 06/25/2017 20:00   Dg Chest 2 View  Result Date: 06/26/2017 CLINICAL DATA:  Acute onset of shortness of breath. Status post CVA. EXAM: CHEST  2 VIEW COMPARISON:  Chest radiograph performed 06/11/2017 FINDINGS: The lungs are well-aerated and clear. There is no evidence of focal opacification, pleural effusion or pneumothorax. The heart is borderline normal in size. The patient is status post median sternotomy, with evidence of prior CABG. No acute osseous abnormalities are seen.  IMPRESSION: No acute cardiopulmonary process seen. Electronically Signed   By: Roanna Raider M.D.   On: 06/26/2017 02:51   Ct Angio Neck W Or  Wo Contrast  Result Date: 06/25/2017 CLINICAL DATA:  Initial evaluation for follow-up stroke. EXAM: CT ANGIOGRAPHY HEAD AND NECK TECHNIQUE: Multidetector CT imaging of the head and neck was performed using the standard protocol during bolus administration of intravenous contrast. Multiplanar CT image reconstructions and MIPs were obtained to evaluate the vascular anatomy. Carotid stenosis measurements (when applicable) are obtained utilizing NASCET criteria, using the distal internal carotid diameter as the denominator. CONTRAST:  75mL ISOVUE-370 IOPAMIDOL (ISOVUE-370) INJECTION 76% COMPARISON:  Prior head CT from earlier the same day. FINDINGS: CTA NECK FINDINGS Aortic arch: Extensive atheromatous plaque throughout the visualized aortic arch. No high-grade stenosis about the origin of the great vessels. Extensive atheromatous regularity throughout the subclavian artery is without high-grade stenosis. Right carotid system: Right common carotid artery is occluded at its mid aspect to the level of the bifurcation. Right ICA entirely included within the neck to the level of the terminus. Opacification of the right external carotid artery and its branches via collateral flow. Left carotid system: Left common carotid artery demonstrates multifocal atheromatous irregularity but is patent from its origin to the bifurcation without flow-limiting stenosis. No significant atheromatous narrowing about the left bifurcation. Left ICA patent within the neck without flow-limiting stenosis. Vertebral arteries: Both of the vertebral artery is arise from the subclavian artery is. Left vertebral artery dominant. Right vertebral artery diffusely hypoplastic. Atheromatous plaque at the origin of the vertebral arteries bilaterally with moderate to severe stenoses. Extensive multifocal atheromatous irregularity throughout the vertebral artery is with resultant mild to moderate multifocal stenoses. Right vertebral artery occludes at the  cranial vault. Skeleton: No acute osseus abnormality. No worrisome lytic or blastic osseous lesions. Advanced degenerative spondylolysis throughout the cervical spine with bulky anterior osteophytic spurring. Other neck: No acute soft tissue abnormality within the neck. Salivary glands within normal limits. Thyroid normal. No adenopathy. Upper chest: Median sternotomy wires partially visualized. No acute abnormality within the visualized upper chest. Emphysema. Review of the MIP images confirms the above findings CTA HEAD FINDINGS Anterior circulation: The right ICA occluded to the level of the terminus. Multifocal plaque within the petrous left ICA with mild narrowing. Advanced atheromatous plaque throughout the cavernous/ supraclinoid left ICA with resultant moderate multifocal irregularity and narrowing. Left ICA terminus widely patent. A1 segments widely patent without stenosis. Patent anterior communicating artery. Anterior cerebral artery is widely patent to their distal aspects without flow-limiting stenosis. Perfusion of the right ICA terminus via flow cross the circle of Willis. Right M1 segment patent without stenosis. Left M1 segment patent as well. Normal MCA bifurcations. No proximal M2 occlusion. Distal MCA branches well perfused and symmetric. Posterior circulation: Left vertebral artery dominant. Multifocal atheromatous plaque throughout the left V4 segment with moderate multifocal narrowing. Left PICA patent. Hypoplastic right vertebral artery occluded at the cranial vault. Right PICA not visualized. Extensive multifocal atheromatous regularity throughout the basilar artery with moderate to severe stenoses, most notable at its distal aspect at the level of the basilar tip (series 8, image 97). Superior cerebral arteries patent bilaterally. Left PCA supplied via the basilar artery. Right PCA supplied via a hypoplastic right P1 segment as well as a right posterior communicating artery. 3 mm ovoid  blush of contrast at the confluence of the right P1 segment and right P com suspicious for possible small aneurysm (series 8, image 97). Extensive  atheromatous irregularity throughout the P2 segments bilaterally the without high-grade stenosis. Distal PCAs not well visualized, and may be occluded given the chronic bilateral PCA territory infarcts. Venous sinuses: Centrally positioned and linear filling defect involving the posterior aspect of the superior sagittal sinus is not seen on 5 minutes delayed sequence, and is favored to reflect a flow related artifact. No evidence for dural sinus thrombosis. Anatomic variants: None significant. Delayed phase: No abnormal enhancement. Review of the MIP images confirms the above findings IMPRESSION: 1. Occlusion of the right common carotid artery at its mid aspect, age indeterminate, and may be chronic given at this would not fit with patient's current symptoms. Right carotid artery system is occluded to the level of the circle of Willis. Distal reconstitution at the level of the right ICA terminus with fairly robust flow throughout the right MCA territory. 2. Extensive atheromatous irregularity throughout the left ICA and MCA without large vessel occlusion. Moderate carotid siphon atherosclerosis without high-grade or correctable stenosis. 3. Extensive atheromatous irregularity throughout the vertebrobasilar system, with moderate to severe multifocal stenoses involving the basilar artery. 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 Distal PCA branches not well visualized, suspected to be occluded given the chronic bilateral PCA territory infarcts. 4. 3 mm focal outpouching arising at the confluence of the right P1 segment and posterior communicating artery, suspicious for small aneurysm. Critical Value/emergent results were called by telephone at the time of interpretation on 06/25/2017 at 7:47 pm to Dr. Ferd Glassing , who verbally  acknowledged these results. Electronically Signed   By: Rise Mu M.D.   On: 06/25/2017 20:00   Ct Cervical Spine Wo Contrast  Result Date: 06/25/2017 CLINICAL DATA:  Acute onset of slurred speech and right-sided facial droop. Last seen normal 9 hours ago. EXAM: CT HEAD WITHOUT CONTRAST CT CERVICAL SPINE WITHOUT CONTRAST TECHNIQUE: Multidetector CT imaging of the head and cervical spine was performed following the standard protocol without intravenous contrast. Multiplanar CT image reconstructions of the cervical spine were also generated. COMPARISON:  CT head without contrast 02/06/2016. FINDINGS: CT HEAD FINDINGS Brain: Remote encephalomalacia of the left occipital lobe known bilateral parietal lobes is stable. Remote lacunar infarcts of the basal ganglia are stable bilaterally. Remote lacunar infarct is again seen in the right thalamus. No acute cortical infarct, hemorrhage, or mass lesion is present. The insular ribbon is intact bilaterally. The basal ganglia are stable. The brainstem and cerebellum are within normal limits. Vascular: Dense calcifications are present within the cavernous internal carotid arteries in the left vertebral artery. There is no hyperdense vessel. Skull: Calvarium is intact. No focal lytic or blastic lesions are present. Sinuses/Orbits: Remote left orbital blowout fracture is stable. The paranasal sinuses and mastoid air cells are otherwise within normal limits. Globes and orbits are otherwise normal. CT CERVICAL SPINE FINDINGS Alignment: Grade 1 anterolisthesis at C2-3 is degenerative. AP alignment is otherwise anatomic. Skull base and vertebrae: The craniocervical junction is normal. Marked degenerative changes are again seen at C1-2. Vertebral body heights are normal. No acute or healing fracture is present. Soft tissues and spinal canal: Vascular calcifications are present bilaterally, most prominent at the right carotid bifurcation. There are calcifications along  the left vertebral artery. These stable. Disc levels: Central canal stenosis is most severe at C3-4 with prominent posterior calcifications narrow the canal. Foraminal narrowing is also greatest at C3-4 without significant change. There is left-sided foraminal disease at C5-6 and C6-7 similar the prior study as well. Upper chest: The lung  apices are clear. IMPRESSION: 1. No acute intracranial abnormality or significant interval change. 2. Aspects 10/10 3. 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 4. Stable remote lacunar infarcts of the basal ganglia. 5. Extensive vascular calcifications. 6. Stable multilevel spondylosis cervical spine without acute abnormality. 7.  Aortic Atherosclerosis (ICD10-I70.0). These results were called by telephone at the time of interpretation on 06/25/2017 at 6:10 pm to Dr. Bethann Berkshire , who verbally acknowledged these results. Electronically Signed   By: Marin Roberts M.D.   On: 06/25/2017 18:10   Mr Brain Wo Contrast  Result Date: 06/27/2017 CLINICAL DATA:  Initial evaluation for acute slurred speech with right facial droop, TIA. EXAM: MRI HEAD WITHOUT CONTRAST TECHNIQUE: Multiplanar, multiecho pulse sequences of the brain and surrounding structures were obtained without intravenous contrast. COMPARISON:  Prior CT and CTA from 06/25/2017. FINDINGS: Brain: Moderately advanced cerebral atrophy. Patchy and confluent T2/FLAIR hyperintensity throughout the periventricular and deep white matter both cerebral hemispheres, consistent with chronic microvascular ischemic changes. Multiple remote lacunar infarcts seen involving the bilateral basal ganglia and right thalamus. Additional remote small vessel infarct present at the left paramedian pons. Remote bilateral PCA territory infarcts. Associated laminar necrosis noted about the chronic right PCA infarct. Chronic hemosiderin staining seen about several of these infarcts, consistent with  chronic blood products. Bifrontal encephalomalacia suspected to be related to remote trauma, although sequelae of prior ischemia could also be considered. 2 cm focus of restricted diffusion seen involving the posterior limb of the right internal capsule, consistent with acute ischemic infarct (series 4, image 25). No associated hemorrhage or mass effect. No other evidence for acute or subacute ischemia. No mass lesion, midline shift, or mass effect. No hydrocephalus. No extra-axial fluid collection. Major dural sinuses are grossly patent. Pituitary gland suprasellar region normal. Midline structures intact. Vascular: Abnormal flow void within the right ICA to the level of the terminus, consistent with previous identified occlusion. Hypoplastic right vertebral artery poorly visualized. Major intracranial vascular flow voids otherwise maintained. Skull and upper cervical spine: Craniocervical junction normal. Upper cervical spine grossly within normal limits. Bone marrow signal intensity somewhat mottled in appearance without discrete osseous lesion. No scalp soft tissue abnormality. Sinuses/Orbits: Globes and orbital soft tissues within normal limits. Remote defect noted at the left lamina for pre shaft. Paranasal sinuses are largely clear. Trace bilateral mastoid effusions, of doubtful significance. Inner ear structures normal. Other: None. IMPRESSION: 1. 2 cm acute ischemic nonhemorrhagic infarct involving the posterior limb of the right internal capsule. 2. Multiple remote infarcts involving the bilateral basal ganglia, right thalamus, and left paramedian pons, with additional remote bilateral PCA territory infarcts. Additional areas of bifrontal encephalomalacia favored to be posttraumatic in nature given location and morphology. 3. 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 4. Moderate cerebral atrophy with chronic microvascular ischemic disease. Electronically Signed   By: Rise Mu M.D.   On: 06/27/2017 00:32   Dg Chest Port 1 View  Result Date: 06/27/2017 CLINICAL DATA:  Shortness of breath.  Dyspnea. EXAM: PORTABLE CHEST 1 VIEW COMPARISON:  06/26/2017 and 06/11/2017 FINDINGS: The heart size and mediastinal contours are within normal limits. Both lungs are clear. The visualized skeletal structures are unremarkable. CABG. IMPRESSION: No active disease. Electronically Signed   By: Francene Boyers M.D.   On: 06/27/2017 11:50   Ct Head Code Stroke Wo Contrast  Result Date: 06/25/2017 CLINICAL DATA:  Acute onset of slurred speech and right-sided facial  droop. Last seen normal 9 hours ago. EXAM: CT HEAD WITHOUT CONTRAST CT CERVICAL SPINE WITHOUT CONTRAST TECHNIQUE: Multidetector CT imaging of the head and cervical spine was performed following the standard protocol without intravenous contrast. Multiplanar CT image reconstructions of the cervical spine were also generated. COMPARISON:  CT head without contrast 02/06/2016. FINDINGS: CT HEAD FINDINGS Brain: Remote encephalomalacia of the left occipital lobe known bilateral parietal lobes is stable. Remote lacunar infarcts of the basal ganglia are stable bilaterally. Remote lacunar infarct is again seen in the right thalamus. No acute cortical infarct, hemorrhage, or mass lesion is present. The insular ribbon is intact bilaterally. The basal ganglia are stable. The brainstem and cerebellum are within normal limits. Vascular: Dense calcifications are present within the cavernous internal carotid arteries in the left vertebral artery. There is no hyperdense vessel. Skull: Calvarium is intact. No focal lytic or blastic lesions are present. Sinuses/Orbits: Remote left orbital blowout fracture is stable. The paranasal sinuses and mastoid air cells are otherwise within normal limits. Globes and orbits are otherwise normal. CT CERVICAL SPINE FINDINGS Alignment: Grade 1 anterolisthesis at C2-3 is degenerative. AP alignment is otherwise  anatomic. Skull base and vertebrae: The craniocervical junction is normal. Marked degenerative changes are again seen at C1-2. Vertebral body heights are normal. No acute or healing fracture is present. Soft tissues and spinal canal: Vascular calcifications are present bilaterally, most prominent at the right carotid bifurcation. There are calcifications along the left vertebral artery. These stable. Disc levels: Central canal stenosis is most severe at C3-4 with prominent posterior calcifications narrow the canal. Foraminal narrowing is also greatest at C3-4 without significant change. There is left-sided foraminal disease at C5-6 and C6-7 similar the prior study as well. Upper chest: The lung apices are clear. IMPRESSION: 1. No acute intracranial abnormality or significant interval change. 2. Aspects 10/10 3. 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 4. Stable remote lacunar infarcts of the basal ganglia. 5. Extensive vascular calcifications. 6. Stable multilevel spondylosis cervical spine without acute abnormality. 7.  Aortic Atherosclerosis (ICD10-I70.0). These results were called by telephone at the time of interpretation on 06/25/2017 at 6:10 pm to Dr. Bethann Berkshire , who verbally acknowledged these results. Electronically Signed   By: Marin Roberts M.D.   On: 06/25/2017 18:10    Assessment/Plan: Diagnosis: Acute ischemic nonhemorrhagic infarct involving the posterior limb of the right internal capsule Labs and images independently reviewed.  Records reviewed and summated above. Stroke: Continue secondary stroke prophylaxis and Risk Factor Modification listed below:   Antiplatelet therapy:   Blood Pressure Management:  Continue current medication with prn's with permisive HTN per primary team Statin Agent:   ? Right sided hemiparesis: fit for orthosis to prevent contractures (splint at night, PRAFO, etc) Motor recovery: Fluoxetine  1. Does the  need for close, 24 hr/day medical supervision in concert with the patient's rehab needs make it unreasonable for this patient to be served in a less intensive setting? Yes  2. Co-Morbidities requiring supervision/potential complications: post-stroke dysphagia (advance diet as tolerated), history of CVA, left CEA, CAD with CABG (cont meds),chronic systolic congestive heart failure (Monitor in accordance with increased physical activity and avoid UE resistance excercises), atrial fibrillation/ intracardiac thrombus (cont meds, monitor HR with increased physical activity), noncompliant (counsel), HTN, hypotensive at present (monitor and provide prns in accordance with increased physical exertion and pain), tachypnea (monitor RR and O2 Sats with increased physical exertion) 3. Due to bladder management, bowel management, skin/wound care,  disease management, medication administration and patient education, does the patient require 24 hr/day rehab nursing? Yes 4. Does the patient require coordinated care of a physician, rehab nurse, PT (1-2 hrs/day, 5 days/week), OT (1-2 hrs/day, 5 days/week) and SLP (1-2 hrs/day, 5 days/week) to address physical and functional deficits in the context of the above medical diagnosis(es)? Yes Addressing deficits in the following areas: balance, endurance, locomotion, strength, transferring, bowel/bladder control, bathing, dressing, feeding, grooming, toileting, cognition, speech, language, swallowing and psychosocial support 5. Can the patient actively participate in an intensive therapy program of at least 3 hrs of therapy per day at least 5 days per week? Potentially 6. The potential for patient to make measurable gains while on inpatient rehab is excellent 7. Anticipated functional outcomes upon discharge from inpatient rehab are supervision and min assist  with PT, supervision and min assist with OT, supervision and min assist with SLP. 8. Estimated rehab length of stay to reach  the above functional goals is: 20-25 days. 9. Anticipated D/C setting: Other 10. Anticipated post D/C treatments: SNF 11. Overall Rehab/Functional Prognosis: good  RECOMMENDATIONS: This patient's condition is appropriate for continued rehabilitative care in the following setting: CIR after completion of medical workup and stable. Patient has agreed to participate in recommended program. Potentially Note that insurance prior authorization may be required for reimbursement for recommended care.  Comment: Rehab Admissions Coordinator to follow up.  Maryla Morrow, MD, ABPMR Charles Rossetti Angiulli, PA-C 06/27/2017

## 2017-06-27 NOTE — Progress Notes (Signed)
NEUROHOSPITALISTS STROKE TEAM - DAILY PROGRESS NOTE   ADMISSION HISTORY: Charles Hatfield is an 77 y.o. male who presented with acute onset of slurred speech and right facial droop. He was evaluated by teleneurology who felt that the patient had a left hemispheric CVA with pure motor weakness and right field cut. It was suspected that the field cut was old however. The patient was not a tPA candidate per teleneurology evaluation.   SUBJECTIVE (INTERVAL HISTORY) No family is at the bedside. Patient is found laying in bed in NAD. Overall he feels his condition is unchanged. Voices no new complaints. No new events reported overnight.  Patient question as to why he was not taking his Coumadin, patient states he does not know why.  No family readily available to get better history.   OBJECTIVE Lab Results: CBC:  Recent Labs  Lab 06/25/17 1749 06/25/17 1802  WBC 8.5  --   HGB 15.6 15.0  HCT 44.9 44.0  MCV 96.8  --   PLT 172  --    BMP: Recent Labs  Lab 06/25/17 1749 06/25/17 1802  NA 141 141  K 4.5 4.5  CL 109 106  CO2 25  --   GLUCOSE 113* 112*  BUN 13 12  CREATININE 1.08 1.00  CALCIUM 9.1  --    Liver Function Tests:  Recent Labs  Lab 06/25/17 1749  AST 19  ALT 11*  ALKPHOS 80  BILITOT 0.8  PROT 6.9  ALBUMIN 4.1   Coagulation Studies:  Recent Labs    06/25/17 1749 06/27/17 0532  APTT 28  --   INR 1.08 1.37   PHYSICAL EXAM Temp:  [97.6 F (36.4 C)-98.2 F (36.8 C)] 98 F (36.7 C) (01/02 1000) Pulse Rate:  [69-82] 72 (01/02 1000) Resp:  [17-26] 26 (01/02 1000) BP: (82-156)/(50-61) 82/57 (01/02 1000) SpO2:  [93 %-100 %] 96 % (01/02 1000) Weight:  [57.1 kg (125 lb 14.1 oz)] 57.1 kg (125 lb 14.1 oz) (01/02 0006) General - Well nourished, well developed, in no apparent distress HEENT-  Normocephalic, Normal external eye/conjunctiva.  Normal external ears. Normal external nose, mucus membranes and septum.    Cardiovascular - Regular rate and rhythm  Respiratory - Lungs clear bilaterally. No wheezing. Abdomen - soft and non-tender, BS normal Extremities- no edema or cyanosis Neurologic Examination: Mental Status: Alert, oriented to day, month, year, city and state. Naming intact. In the context of severe dysarthria, speech is fluent.  Able to follow all commands without difficulty. Cranial Nerves: II:  Right lower quadrant visual field cut. PERRL. III,IV, VI: Eyes conjugate. Saccadic visual pursuits are full horizontally. Mild impairment of upgaze bilaterally.  No nystagmus.  V,VII: Right facial droop. Facial temp sensation equal bilaterally. VIII: hearing intact to voice IX,X: Pharyngeal sever dysarthria XI: No definite asymmetry XII: Lingual dysarthria. +drooling  Motor: RUE: 4/5, RLE 4/5 LUE: 4/5, LLE: 3+/5 Sensory: FT grossly intact x 4  Deep Tendon Reflexes:  Right biceps/brachioradialis 2+ Left biceps/brachioradialis 1+ Right patella 2+, left patella 0. 0 achilles bilaterally. Plantars: Upgoing bilaterally Cerebellar: No ataxia with FNF bilaterally Gait: Deferred.   IMAGING: I have personally reviewed the radiological images below and agree with the radiology interpretations.  Ct Angio Head and Neck W Or Wo Contrast Result Date: 06/25/2017 IMPRESSION: 1. Occlusion of the right common carotid artery at its mid aspect, age indeterminate, and may be chronic given at this would not fit with patient's current symptoms. Right carotid artery system is occluded to the  level of the circle of Willis. Distal reconstitution at the level of the right ICA terminus with fairly robust flow throughout the right MCA territory. 2. Extensive atheromatous irregularity throughout the left ICA and MCA without large vessel occlusion. Moderate carotid siphon atherosclerosis without high-grade or correctable stenosis. 3. Extensive atheromatous irregularity throughout the vertebrobasilar system, with  moderate to severe multifocal stenoses involving the basilar artery. 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 Distal PCA branches not well visualized, suspected to be occluded given the chronic bilateral PCA territory infarcts. 4. 3 mm focal outpouching arising at the confluence of the right P1 segment and posterior communicating artery, suspicious for small aneurysm. Critical Value/emergent results were called by telephone at the time of interpretation on 06/25/2017 at 7:47 pm to Dr. Ferd Glassing , who verbally acknowledged these results. Electronically Signed   By: Rise Mu M.D.   On: 06/25/2017 20:00   Ct Cervical Spine Wo Contrast Result Date: 06/25/2017 IMPRESSION: 1. No acute intracranial abnormality or significant interval change. 2. Aspects 10/10 3. 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 4. Stable remote lacunar infarcts of the basal ganglia. 5. Extensive vascular calcifications. 6. Stable multilevel spondylosis cervical spine without acute abnormality. 7.  Aortic Atherosclerosis (ICD10-I70.0). These results were called by telephone at the time of interpretation on 06/25/2017 at 6:10 pm to Dr. Bethann Berkshire , who verbally acknowledged these results. Electronically Signed   By: Marin Roberts M.D.   On: 06/25/2017 18:10   Mr Brain Wo Contrast Result Date: 06/27/2017 IMPRESSION: 1. 2 cm acute ischemic nonhemorrhagic infarct involving the posterior limb of the right internal capsule. 2. Multiple remote infarcts involving the bilateral basal ganglia, right thalamus, and left paramedian pons, with additional remote bilateral PCA territory infarcts. Additional areas of bifrontal encephalomalacia favored to be posttraumatic in nature given location and morphology. 3. 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 4. Moderate cerebral atrophy with chronic microvascular ischemic disease.  Electronically Signed   By: Rise Mu M.D.   On: 06/27/2017 00:32   Ct Head Code Stroke Wo Contrast Result Date: 06/25/2017 IMPRESSION: 1. No acute intracranial abnormality or significant interval change. 2. Aspects 10/10 3. 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 4. Stable remote lacunar infarcts of the basal ganglia. 5. Extensive vascular calcifications. 6. Stable multilevel spondylosis cervical spine without acute abnormality. 7.  Aortic Atherosclerosis (ICD10-I70.0). These results were called by telephone at the time of interpretation on 06/25/2017 at 6:10 pm to Dr. Bethann Berkshire , who verbally acknowledged these results. Electronically Signed   By: Marin Roberts M.D.   On: 06/25/2017 18:10   Echocardiogram:                                              PENDING    IMPRESSION: [name] is a 77 y.o. male with PMH of PMHx includes severe PAD, prior CVA, CAD s/p CABG, L CEA, severe right carotid disease, HTN and prior aortobifemoral bypass, Hx of perioperative atrial fibrillation and history of left ventricular mural thrombus following NSTEMI in 2013. He is non-compliant with Coumadin with admission INR subtherapeutic. MRI reveals:  2 cm acute ischemic nonhemorrhagic infarct posterior limb of the right internal capsule.  Suspected Etiology: Atrial fibrillation with subtherapeutic INR Resultant Symptoms:  Right facial droop, dysarthria, right field cut, right-sided weakness Stroke Risk Factors: atrial fibrillation, hyperlipidemia and hypertension Other Stroke Risk Factors: Advanced age, Hx stroke, CAD, PAD  Outstanding Stroke Work-up Studies:     Echocardiogram:                                                    PENDING  06/27/2017 ASSESSMENT:   Neurology exam remains stable with right facial droop dysarthria right field cut and right-sided weakness.  No family at bedside.  Patient unsure why he was not taking his Coumadin.   Coumadin restarted today.  Echocardiogram pending.  Patient will need close PCP follow-up and assistance from case management.  Neurology follow-up in 6 weeks.  PLAN  06/27/2017: Continue Aspirin/Coumadin Irven Easterly Avoid episodes of hypotension Check orthostatics Frequent neuro checks Telemetry monitoring PT/OT/SLP Consult PM & Rehab Consult Case Management /MSW Ongoing aggressive stroke risk factor management Patient counseled to be compliant with his antithrombotic medications Patient counseled on Lifestyle modifications including, Diet, Exercise, and Stress Follow up with [facility] in 6 weeks  HX OF STROKES: Stable remote infarct left occipital lobe Bilateral parietal lobes Stable remote lacunar infarcts of the basal ganglia.   3 mm focal outpouching right P1 segment & posterior communicating artery, suspicious for small aneurysm Stable Outpatient neurology surveillance as appropriate  INTRACRANIAL Atherosclerosis &Stenosis: On DAPT, ASA and Coumadin for now  History of atrial fibrillation, chronic Admission INR subtherapeutic Patient states he was not taking Coumadin, for unknown reason. Coumadin to be restarted per pharmacy  DYSPHAGIA: Passed SLP swallow evaluation -dysphasia 1 diet Aspiration Precautions in progress  HYPERTENSION: Stable, hypotension noted overnight Avoid hypotension Permissive hypertension (OK if <220/120) for 24-48 hours post stroke and then gradually normalized within 5-7 days. Long term BP goal normotensive. May slowly start  B/P medications, if necessary Home Meds: NONE  HYPERLIPIDEMIA:    Component Value Date/Time   CHOL 176 06/26/2017 0500   TRIG 98 06/26/2017 0500   HDL 39 (L) 06/26/2017 0500   CHOLHDL 4.5 06/26/2017 0500   VLDL 20 06/26/2017 0500   LDLCALC 117 (H) 06/26/2017 0500  Home Meds: Lipitor 20 mg LDL  goal < 70 Increased to  Lipitor to 80 mg daily Continue statin at discharge  R/O DIABETES: Lab Results    Component Value Date   HGBA1C 4.9 06/26/2017  HgbA1c goal < 7.0  Other Active Problems: Principal Problem:   Acute CVA (cerebrovascular accident) (HCC) Active Problems:   HTN (hypertension)   Left ventricular apical thrombus following MI, NSTEMI, 01/13/12   CAD (coronary artery disease) status post CABG July 2013   COPD (chronic obstructive pulmonary disease) (HCC)   Ischemic cardiomyopathy, EF 40-45% 2D 01/15/12   History of stroke   S/P carotid endarterectomy   Lt, 01/23/12   CKD (chronic kidney disease) stage 3, GFR 30-59 ml/min (HCC)   Mixed hyperlipidemia   Right hemiparesis (HCC)   Dysarthria   Dysphagia   Long term (current) use of anticoagulants    Hospital day # 2 VTE prophylaxis: Coumadin Diet : DIET - DYS 1 Room service appropriate? Yes; Fluid consistency: Honey Thick   FAMILY UPDATES: No family at bedside  TEAM UPDATES: Leatha Gilding, MD   Prior Home Stroke Medications:  aspirin 81 mg daily  Discharge Stroke Meds:  Please discharge patient on  warfarin daily , ASA 81 mg and Lipitor 80 mg  Disposition: 01-Home or Self Care Therapy Recs:               CIR vs SNF Home Equipment:         PENDING Follow Up:  Follow-up Information    [name], NP. Schedule an appointment as soon as possible for a visit in 6 week(s).   Specialty:  Family Medicine Contact information: [address] [place] [address] [phone]          Default, Provider, MD -PCP Follow up in 1-2 weeks   Case Management aware of need   Assessment & plan discussed with with attending physician and they are in agreement.    Brita Romp Stroke Neurology Team 06/27/2017 12:25 PM  ATTENDING NOTE: I reviewed above note and agree with the assessment and plan. I have made any additions or clarifications directly to the above note. Pt was seen and examined.   77 year old male with history of CAD/MI status post CABG in 2013, perioperative A. fib and LV  thrombus post MI in 2013, bilateral carotid stenosis status post left CEA in 2013, diabetes, hypertension, hyperlipidemia, CHF, severe PAD status post bypass, status post ascending aorta repair, and stroke admitted for dysarthria, right facial droop, and likely old right field cut.  CT showed remote infarcts and left PCA and bilateral MCA and basal ganglia.  CTA head and neck showed right CCA chronic occlusion, left ICA siphon atherosclerosis, left VA, PA multifocal stenosis, right PCA distal stenosis.  Status post CVA and right VA hypoplastic.  MRI showed right internal capsule small infarct.  2D echo pending.  LDL 117, A1c 4.9, UDS negative.  According to his medication list, he is supposed to be on Coumadin, however, he has not been taking.  INR 1.08 on admission.  Patient living condition is concerning.  He and his son lives in the hotel/motel, not able to afford medication and INR check as well as Dr. appointment.  Currently he is on aspirin 81, Lipitor 80 and Coumadin with INR goal 2-3.  Case manager and social worker is working on placement.  Severe dysarthria on pured diet and honey thick liquid.  PT OT recommend CIR. Will follow.  Marvel Plan, MD PhD Stroke Neurology 06/27/2017 2:22 PM     To contact Stroke Continuity provider, please refer to [email or web address]. After hours, contact General Neurology

## 2017-06-27 NOTE — Progress Notes (Signed)
Inpatient Rehabilitation  Per PT request, patient was screened by [facility] Monestime for appropriateness for an Inpatient Acute Rehab consult.  At this time we are recommending an Inpatient Rehab consult.  Text paged MD to notify; please order if you are agreeable.    Tangia Pinard, M.A., CCC/SLP Admission Coordinator  [facility]  Cell [phone]  

## 2017-06-27 NOTE — Progress Notes (Signed)
Pt experiencing difficulty breathing, NIH increase from 2 to 5 from the previous shift. BP 82/57, BP meds due. MD notified, gave verbal order to hold med.

## 2017-06-27 NOTE — Progress Notes (Signed)
Orthostatic vitals taken at 1700. Pt became agitated before 3 minutes and sat down and refused to stand up due to pain

## 2017-06-27 NOTE — Evaluation (Signed)
Occupational Therapy Evaluation Patient Details Name: Charles Hatfield MRN: [id number] DOB: 01/27/1941 Today's Date: 06/27/2017    History of Present Illness Patient is a 77 y/o male who presents with right facial droop and dysarthria. NIH 7. Unwitnessed fall at home and 3 week history of right sided visual deficits. MRI- acute infarct posterior limb of the internal capsule. CTA- occlusion right common carotid- maybe chronic. PMH includes CVA, PAD s/p aortobifemoral bypass and left CEA, intracardiac thrombus nonadherent to coumadin, CABG, HTN, DM, COPD, A-fib, CAD.    Clinical Impression   Pt reports he was independent with ADL PTA. Currently pt requires mod assist for sit to stand from EOB and min-max assist for ADL. Pt presenting with cognitive deficits, visual impairments, R sided weakness, and poor balance impacting his independence and safety with ADL and functional mobility. Recommending CIR level therapies to maximize independence and safety with ADL and functional mobility prior to return home. Pt would benefit from continued skilled OT to address established goals.    Follow Up Recommendations  CIR;Supervision/Assistance - 24 hour    Equipment Recommendations  Other (comment)(TBD at next venue)    Recommendations for Other Services       Precautions / Restrictions Precautions Precautions: Fall Restrictions Weight Bearing Restrictions: No      Mobility Bed Mobility Overal bed mobility: Needs Assistance Bed Mobility: Supine to Sit;Sit to Supine     Supine to sit: Mod assist;HOB elevated Sit to supine: Mod assist;HOB elevated   General bed mobility comments: Assist for trunk elevation and scooting hips out to EOB. Assist for LEs back to bed.   Transfers Overall transfer level: Needs assistance Equipment used: 1 person hand held assist Transfers: Sit to/from Stand Sit to Stand: Mod assist         General transfer comment: Mod assist to boost up from EOB and for  standing balance x2.    Balance Overall balance assessment: Needs assistance Sitting-balance support: Feet supported;No upper extremity supported Sitting balance-Leahy Scale: Fair Sitting balance - Comments: Min guard throughout   Standing balance support: Single extremity supported;During functional activity Standing balance-Leahy Scale: Poor                             ADL either performed or assessed with clinical judgement   ADL Overall ADL's : Needs assistance/impaired     Grooming: Set up;Supervision/safety;Bed level;Wash/dry face   Upper Body Bathing: Moderate assistance;Sitting   Lower Body Bathing: Maximal assistance;Sit to/from stand   Upper Body Dressing : Minimal assistance;Sitting Upper Body Dressing Details (indicate cue type and reason): to doff/don new gown Lower Body Dressing: Maximal assistance;Sit to/from stand               Functional mobility during ADLs: Moderate assistance(for sit to stand only)       Vision Baseline Vision/History: No visual deficits Patient Visual Report: Blurring of vision Vision Assessment?: Vision impaired- to be further tested in functional context Additional Comments: Needs further assessment. Right sided deficits--old vs new?     Perception     Praxis      Pertinent Vitals/Pain Pain Assessment: No/denies pain     Hand Dominance Right   Extremity/Trunk Assessment Upper Extremity Assessment Upper Extremity Assessment: Generalized weakness   Lower Extremity Assessment Lower Extremity Assessment: Defer to PT evaluation   Cervical / Trunk Assessment Cervical / Trunk Assessment: Kyphotic   Communication Communication Communication: Expressive difficulties   Cognition Arousal/Alertness: Awake/alert  Behavior During Therapy: WFL for tasks assessed/performed Overall Cognitive Status: Difficult to assess                                 General Comments: Pt following one step commands  consistenlty. Difficult to understand for formal cognitive testing.   General Comments       Exercises     Shoulder Instructions      Home Living Family/patient expects to be discharged to:: Private residence Living Arrangements: Children Available Help at Discharge: Family;Available PRN/intermittently Type of Home: House Home Access: Level entry     Home Layout: One level         Bathroom Toilet: Standard     Home Equipment: Cane - single point   Additional Comments: Information obtained from PT eval  Lives With: Son    Prior Functioning/Environment Level of Independence: Independent        Comments: Reports driving and using SPC for ambulaton PTA as needed. Lives with son.        OT Problem List: Decreased strength;Decreased activity tolerance;Impaired balance (sitting and/or standing);Impaired vision/perception;Decreased safety awareness;Decreased cognition;Decreased knowledge of use of DME or AE      OT Treatment/Interventions: Self-care/ADL training;Therapeutic exercise;Neuromuscular education;Energy conservation;DME and/or AE instruction;Therapeutic activities;Cognitive remediation/compensation;Visual/perceptual remediation/compensation;Patient/family education;Balance training    OT Goals(Current goals can be found in the care plan section) Acute Rehab OT Goals Patient Stated Goal: to return to independence OT Goal Formulation: With patient Time For Goal Achievement: 07/11/17 Potential to Achieve Goals: Good ADL Goals Pt Will Perform Grooming: (P) with supervision;sitting(x3 tasks) Pt Will Perform Upper Body Bathing: (P) with supervision;sitting Pt Will Perform Lower Body Bathing: (P) with min assist;sit to/from stand Pt Will Transfer to Toilet: (P) with min assist;ambulating;bedside commode Pt Will Perform Toileting - Clothing Manipulation and hygiene: (P) with min assist;sit to/from stand  OT Frequency: Min 2X/week   Barriers to D/C:             Co-evaluation              AM-PAC PT "6 Clicks" Daily Activity     Outcome Measure Help from another person eating meals?: A Little Help from another person taking care of personal grooming?: A Little Help from another person toileting, which includes using toliet, bedpan, or urinal?: A Lot Help from another person bathing (including washing, rinsing, drying)?: A Lot Help from another person to put on and taking off regular upper body clothing?: A Little Help from another person to put on and taking off regular lower body clothing?: A Lot 6 Click Score: 15   End of Session    Activity Tolerance: Patient tolerated treatment well Patient left: in bed;with call bell/phone within reach;with bed alarm set  OT Visit Diagnosis: Unsteadiness on feet (R26.81);Other abnormalities of gait and mobility (R26.89);Muscle weakness (generalized) (M62.81)                Time: 8469-6295 OT Time Calculation (min): 15 min Charges:  OT General Charges $OT Visit: 1 Visit OT Evaluation $OT Eval Moderate Complexity: 1 Mod G-Codes:     Britiney Blahnik A. Brett Albino, M.S., OTR/L Pager: [phone]  Gaye Alken 06/27/2017, 4:51 PM

## 2017-06-27 NOTE — Progress Notes (Addendum)
PROGRESS NOTE  Charles Hatfield EAV:[id number] DOB: [birth year] DOA: 06/25/2017 PCP: Default, Provider, MD   LOS: 2 days   Brief Narrative / Interim history:  Charles Hatfield is a 77 y.o. male with a history of PAD s/p aortobifemoral bypass and left CEA, CVA, CAD s/p CABG, intracardiac thrombus nonadherent to coumadin, and HTN who presented to the ED for new slurred speech and right facial droop for several hours. He also had an unwitnessed fall at home in the bathroom, and had a 3 week history of right-sided vision deficits. Initial CT in the ED demonstrated stable remote infarcts in bilateral parietal and left occipital lobes, remote basal ganglia lacunes and no acute findings. Teleneurology was consulted, felt he was not tPA candidate due to outside window. CTA head and neck showed time-indeterminate occlusion of the right common carotid artery and extensive atherosclerosis.   MRI was positive for stroke.  Assessment & Plan: Principal Problem:   Acute CVA (cerebrovascular accident) [facility]) Active Problems:   HTN (hypertension)   Left ventricular apical thrombus following MI, NSTEMI, 01/13/12   CAD (coronary artery disease) status post CABG July 2013   COPD (chronic obstructive pulmonary disease) (HCC)   Ischemic cardiomyopathy, EF 40-45% 2D 01/15/12   History of stroke   S/P carotid endarterectomy   Lt, 01/23/12   CKD (chronic kidney disease) stage 3, GFR 30-59 ml/min (HCC)   Mixed hyperlipidemia   Right hemiparesis (HCC)   Dysarthria   Dysphagia   Long term (current) use of anticoagulants   CVA -Neurology consulted, appreciate input.  Charles Hatfield already on Coumadin, continue per pharmacy.  Charles Hatfield with profound dysarthria, speech therapy evaluated on 1/1 and recommended dysphagia 1 with honey thick liquids.  Concern for increased work of breathing on 1/2 in the morning, suspect Charles Hatfield also has silent aspiration, please elevate head of bed at 30 degrees.  We will repeat the chest x-ray this  morning. -Continue aspirin -2D echocardiogram pending -Hemoglobin A1c 4.9 -Lipid panel with LDL 117, continue atorvastatin at 80 mg -Physical therapy recommended CIR, consulted today  Diffuse atherosclerotic disease including PAD, carotid stenosis and CVD as above.  - ASA, BP and CBG control.   CAD s/p CABG and ischemic cardiomyopathy  -No chest pain/dyspnea. Troponin neg. Euvolemic -Aspirin.  -Per cardiology note, beta blockers avoided due to COPD.   Chronic systolic CHF -most recent 2D echo back in 2014 showed an EF of 40-45% -Charles Hatfield has no lower extremity edema, chest x-ray is pending but appears euvolemic -2D echo pending  COPD -Stable, no exacerbation currently.  -Continue prn bronchodilators    DVT prophylaxis: Coumadin Code Status: FUll code Family Communication: no family at bedside Disposition Plan: CIR to evaluate  Consultants:   Neurology   Procedures:   2D echo: pending   Antimicrobials:  None    Subjective: - no chest pain, shortness of breath, no abdominal pain, nausea or vomiting.   Objective: Vitals:   06/27/17 0006 06/27/17 0022 06/27/17 0553 06/27/17 1000  BP:  (!) 125/56 (!) 125/52 (!) 82/57  Pulse:  75 70 72  Resp:  20 18 (!) 26  Temp:  97.6 F (36.4 C) 97.7 F (36.5 C) 98 F (36.7 C)  TempSrc:  Oral Oral Oral  SpO2:  99% 99% 96%  Weight: 57.1 kg (125 lb 14.1 oz)       Intake/Output Summary (Last 24 hours) at 06/27/2017 1117 Last data filed at 06/27/2017 0553 Gross per 24 hour  Intake -  Output 800 ml  Net -800 ml   Filed Weights   06/27/17 0006  Weight: 57.1 kg (125 lb 14.1 oz)    Examination:  Constitutional: NAD, however there are audible gurgling sounds with Charles Hatfield laying flat Eyes: lids and conjunctivae normal ENMT: Mucous membranes are moist. Respiratory: Coarse breath sounds, no wheezing, no crackles.  Breath sounds improving with Charles Hatfield upright.  He has no accessory muscle use however appears slightly  tachypneic Cardiovascular: Regular rate and rhythm, no murmurs / rubs / gallops. No LE edema. Abdomen: no tenderness. Bowel sounds positive.  Skin: no rashes Neurologic: non focal   Data Reviewed: I have independently reviewed following labs and imaging studies   CBC: Recent Labs  Lab 06/25/17 1749 06/25/17 1802  WBC 8.5  --   NEUTROABS 6.1  --   HGB 15.6 15.0  HCT 44.9 44.0  MCV 96.8  --   PLT 172  --    Basic Metabolic Panel: Recent Labs  Lab 06/25/17 1749 06/25/17 1802  NA 141 141  K 4.5 4.5  CL 109 106  CO2 25  --   GLUCOSE 113* 112*  BUN 13 12  CREATININE 1.08 1.00  CALCIUM 9.1  --    GFR: CrCl cannot be calculated (Unknown ideal weight.). Liver Function Tests: Recent Labs  Lab 06/25/17 1749  AST 19  ALT 11*  ALKPHOS 80  BILITOT 0.8  PROT 6.9  ALBUMIN 4.1   No results for input(s): LIPASE, AMYLASE in the last 168 hours. No results for input(s): AMMONIA in the last 168 hours. Coagulation Profile: Recent Labs  Lab 06/25/17 1749 06/27/17 0532  INR 1.08 1.37   Cardiac Enzymes: No results for input(s): CKTOTAL, CKMB, CKMBINDEX, TROPONINI in the last 168 hours. BNP (last 3 results) No results for input(s): PROBNP in the last 8760 hours. HbA1C: Recent Labs    06/26/17 0500  HGBA1C 4.9   CBG: Recent Labs  Lab 06/25/17 1741  GLUCAP 111*   Lipid Profile: Recent Labs    06/26/17 0500  CHOL 176  HDL 39*  LDLCALC 117*  TRIG 98  CHOLHDL 4.5   Thyroid Function Tests: No results for input(s): TSH, T4TOTAL, FREET4, T3FREE, THYROIDAB in the last 72 hours. Anemia Panel: No results for input(s): VITAMINB12, FOLATE, FERRITIN, TIBC, IRON, RETICCTPCT in the last 72 hours. Urine analysis:    Component Value Date/Time   COLORURINE YELLOW 06/25/2017 1900   APPEARANCEUR HAZY (A) 06/25/2017 1900   LABSPEC 1.021 06/25/2017 1900   PHURINE 7.0 06/25/2017 1900   GLUCOSEU NEGATIVE 06/25/2017 1900   HGBUR SMALL (A) 06/25/2017 1900   BILIRUBINUR  NEGATIVE 06/25/2017 1900   KETONESUR NEGATIVE 06/25/2017 1900   PROTEINUR NEGATIVE 06/25/2017 1900   UROBILINOGEN 1 06/10/2014 1518   NITRITE NEGATIVE 06/25/2017 1900   LEUKOCYTESUR NEGATIVE 06/25/2017 1900   Sepsis Labs: Invalid input(s): PROCALCITONIN, LACTICIDVEN  No results found for this or any previous visit (from the past 240 hour(s)).    Radiology Studies: Ct Angio Head W Or Wo Contrast  Result Date: 06/25/2017 CLINICAL DATA:  Initial evaluation for follow-up stroke. EXAM: CT ANGIOGRAPHY HEAD AND NECK TECHNIQUE: Multidetector CT imaging of the head and neck was performed using the standard protocol during bolus administration of intravenous contrast. Multiplanar CT image reconstructions and MIPs were obtained to evaluate the vascular anatomy. Carotid stenosis measurements (when applicable) are obtained utilizing NASCET criteria, using the distal internal carotid diameter as the denominator. CONTRAST:  75mL ISOVUE-370 IOPAMIDOL (ISOVUE-370) INJECTION 76% COMPARISON:  Prior head CT from earlier the  same day. FINDINGS: CTA NECK FINDINGS Aortic arch: Extensive atheromatous plaque throughout the visualized aortic arch. No high-grade stenosis about the origin of the great vessels. Extensive atheromatous regularity throughout the subclavian artery is without high-grade stenosis. Right carotid system: Right common carotid artery is occluded at its mid aspect to the level of the bifurcation. Right ICA entirely included within the neck to the level of the terminus. Opacification of the right external carotid artery and its branches via collateral flow. Left carotid system: Left common carotid artery demonstrates multifocal atheromatous irregularity but is patent from its origin to the bifurcation without flow-limiting stenosis. No significant atheromatous narrowing about the left bifurcation. Left ICA patent within the neck without flow-limiting stenosis. Vertebral arteries: Both of the vertebral  artery is arise from the subclavian artery is. Left vertebral artery dominant. Right vertebral artery diffusely hypoplastic. Atheromatous plaque at the origin of the vertebral arteries bilaterally with moderate to severe stenoses. Extensive multifocal atheromatous irregularity throughout the vertebral artery is with resultant mild to moderate multifocal stenoses. Right vertebral artery occludes at the cranial vault. Skeleton: No acute osseus abnormality. No worrisome lytic or blastic osseous lesions. Advanced degenerative spondylolysis throughout the cervical spine with bulky anterior osteophytic spurring. Other neck: No acute soft tissue abnormality within the neck. Salivary glands within normal limits. Thyroid normal. No adenopathy. Upper chest: Median sternotomy wires partially visualized. No acute abnormality within the visualized upper chest. Emphysema. Review of the MIP images confirms the above findings CTA HEAD FINDINGS Anterior circulation: The right ICA occluded to the level of the terminus. Multifocal plaque within the petrous left ICA with mild narrowing. Advanced atheromatous plaque throughout the cavernous/ supraclinoid left ICA with resultant moderate multifocal irregularity and narrowing. Left ICA terminus widely patent. A1 segments widely patent without stenosis. Patent anterior communicating artery. Anterior cerebral artery is widely patent to their distal aspects without flow-limiting stenosis. Perfusion of the right ICA terminus via flow cross the circle of Willis. Right M1 segment patent without stenosis. Left M1 segment patent as well. Normal MCA bifurcations. No proximal M2 occlusion. Distal MCA branches well perfused and symmetric. Posterior circulation: Left vertebral artery dominant. Multifocal atheromatous plaque throughout the left V4 segment with moderate multifocal narrowing. Left PICA patent. Hypoplastic right vertebral artery occluded at the cranial vault. Right PICA not visualized.  Extensive multifocal atheromatous regularity throughout the basilar artery with moderate to severe stenoses, most notable at its distal aspect at the level of the basilar tip (series 8, image 97). Superior cerebral arteries patent bilaterally. Left PCA supplied via the basilar artery. Right PCA supplied via a hypoplastic right P1 segment as well as a right posterior communicating artery. 3 mm ovoid blush of contrast at the confluence of the right P1 segment and right P com suspicious for possible small aneurysm (series 8, image 97). Extensive atheromatous irregularity throughout the P2 segments bilaterally the without high-grade stenosis. Distal PCAs not well visualized, and may be occluded given the chronic bilateral PCA territory infarcts. Venous sinuses: Centrally positioned and linear filling defect involving the posterior aspect of the superior sagittal sinus is not seen on 5 minutes delayed sequence, and is favored to reflect a flow related artifact. No evidence for dural sinus thrombosis. Anatomic variants: None significant. Delayed phase: No abnormal enhancement. Review of the MIP images confirms the above findings IMPRESSION: 1. Occlusion of the right common carotid artery at its mid aspect, age indeterminate, and may be chronic given at this would not fit with Charles Hatfield's current symptoms. Right carotid artery  system is occluded to the level of the circle of Willis. Distal reconstitution at the level of the right ICA terminus with fairly robust flow throughout the right MCA territory. 2. Extensive atheromatous irregularity throughout the left ICA and MCA without large vessel occlusion. Moderate carotid siphon atherosclerosis without high-grade or correctable stenosis. 3. Extensive atheromatous irregularity throughout the vertebrobasilar system, with moderate to severe multifocal stenoses involving the basilar artery. 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 Distal PCA branches not  well visualized, suspected to be occluded given the chronic bilateral PCA territory infarcts. 4. 3 mm focal outpouching arising at the confluence of the right P1 segment and posterior communicating artery, suspicious for small aneurysm. Critical Value/emergent results were called by telephone at the time of interpretation on 06/25/2017 at 7:47 pm to Dr. Ferd Glassing , who verbally acknowledged these results. Electronically Signed   By: Rise Mu M.D.   On: 06/25/2017 20:00   Dg Chest 2 View  Result Date: 06/26/2017 CLINICAL DATA:  Acute onset of shortness of breath. Status post CVA. EXAM: CHEST  2 VIEW COMPARISON:  Chest radiograph performed 06/11/2017 FINDINGS: The lungs are well-aerated and clear. There is no evidence of focal opacification, pleural effusion or pneumothorax. The heart is borderline normal in size. The Charles Hatfield is status post median sternotomy, with evidence of prior CABG. No acute osseous abnormalities are seen. IMPRESSION: No acute cardiopulmonary process seen. Electronically Signed   By: Roanna Raider M.D.   On: 06/26/2017 02:51   Ct Angio Neck W Or Wo Contrast  Result Date: 06/25/2017 CLINICAL DATA:  Initial evaluation for follow-up stroke. EXAM: CT ANGIOGRAPHY HEAD AND NECK TECHNIQUE: Multidetector CT imaging of the head and neck was performed using the standard protocol during bolus administration of intravenous contrast. Multiplanar CT image reconstructions and MIPs were obtained to evaluate the vascular anatomy. Carotid stenosis measurements (when applicable) are obtained utilizing NASCET criteria, using the distal internal carotid diameter as the denominator. CONTRAST:  75mL ISOVUE-370 IOPAMIDOL (ISOVUE-370) INJECTION 76% COMPARISON:  Prior head CT from earlier the same day. FINDINGS: CTA NECK FINDINGS Aortic arch: Extensive atheromatous plaque throughout the visualized aortic arch. No high-grade stenosis about the origin of the great vessels. Extensive  atheromatous regularity throughout the subclavian artery is without high-grade stenosis. Right carotid system: Right common carotid artery is occluded at its mid aspect to the level of the bifurcation. Right ICA entirely included within the neck to the level of the terminus. Opacification of the right external carotid artery and its branches via collateral flow. Left carotid system: Left common carotid artery demonstrates multifocal atheromatous irregularity but is patent from its origin to the bifurcation without flow-limiting stenosis. No significant atheromatous narrowing about the left bifurcation. Left ICA patent within the neck without flow-limiting stenosis. Vertebral arteries: Both of the vertebral artery is arise from the subclavian artery is. Left vertebral artery dominant. Right vertebral artery diffusely hypoplastic. Atheromatous plaque at the origin of the vertebral arteries bilaterally with moderate to severe stenoses. Extensive multifocal atheromatous irregularity throughout the vertebral artery is with resultant mild to moderate multifocal stenoses. Right vertebral artery occludes at the cranial vault. Skeleton: No acute osseus abnormality. No worrisome lytic or blastic osseous lesions. Advanced degenerative spondylolysis throughout the cervical spine with bulky anterior osteophytic spurring. Other neck: No acute soft tissue abnormality within the neck. Salivary glands within normal limits. Thyroid normal. No adenopathy. Upper chest: Median sternotomy wires partially visualized. No acute abnormality within the visualized upper chest. Emphysema. Review of the  MIP images confirms the above findings CTA HEAD FINDINGS Anterior circulation: The right ICA occluded to the level of the terminus. Multifocal plaque within the petrous left ICA with mild narrowing. Advanced atheromatous plaque throughout the cavernous/ supraclinoid left ICA with resultant moderate multifocal irregularity and narrowing. Left ICA  terminus widely patent. A1 segments widely patent without stenosis. Patent anterior communicating artery. Anterior cerebral artery is widely patent to their distal aspects without flow-limiting stenosis. Perfusion of the right ICA terminus via flow cross the circle of Willis. Right M1 segment patent without stenosis. Left M1 segment patent as well. Normal MCA bifurcations. No proximal M2 occlusion. Distal MCA branches well perfused and symmetric. Posterior circulation: Left vertebral artery dominant. Multifocal atheromatous plaque throughout the left V4 segment with moderate multifocal narrowing. Left PICA patent. Hypoplastic right vertebral artery occluded at the cranial vault. Right PICA not visualized. Extensive multifocal atheromatous regularity throughout the basilar artery with moderate to severe stenoses, most notable at its distal aspect at the level of the basilar tip (series 8, image 97). Superior cerebral arteries patent bilaterally. Left PCA supplied via the basilar artery. Right PCA supplied via a hypoplastic right P1 segment as well as a right posterior communicating artery. 3 mm ovoid blush of contrast at the confluence of the right P1 segment and right P com suspicious for possible small aneurysm (series 8, image 97). Extensive atheromatous irregularity throughout the P2 segments bilaterally the without high-grade stenosis. Distal PCAs not well visualized, and may be occluded given the chronic bilateral PCA territory infarcts. Venous sinuses: Centrally positioned and linear filling defect involving the posterior aspect of the superior sagittal sinus is not seen on 5 minutes delayed sequence, and is favored to reflect a flow related artifact. No evidence for dural sinus thrombosis. Anatomic variants: None significant. Delayed phase: No abnormal enhancement. Review of the MIP images confirms the above findings IMPRESSION: 1. Occlusion of the right common carotid artery at its mid aspect, age  indeterminate, and may be chronic given at this would not fit with Charles Hatfield's current symptoms. Right carotid artery system is occluded to the level of the circle of Willis. Distal reconstitution at the level of the right ICA terminus with fairly robust flow throughout the right MCA territory. 2. Extensive atheromatous irregularity throughout the left ICA and MCA without large vessel occlusion. Moderate carotid siphon atherosclerosis without high-grade or correctable stenosis. 3. Extensive atheromatous irregularity throughout the vertebrobasilar system, with moderate to severe multifocal stenoses involving the basilar artery. 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 Distal PCA branches not well visualized, suspected to be occluded given the chronic bilateral PCA territory infarcts. 4. 3 mm focal outpouching arising at the confluence of the right P1 segment and posterior communicating artery, suspicious for small aneurysm. Critical Value/emergent results were called by telephone at the time of interpretation on 06/25/2017 at 7:47 pm to Dr. Ferd Glassing , who verbally acknowledged these results. Electronically Signed   By: Rise Mu M.D.   On: 06/25/2017 20:00   Ct Cervical Spine Wo Contrast  Result Date: 06/25/2017 CLINICAL DATA:  Acute onset of slurred speech and right-sided facial droop. Last seen normal 9 hours ago. EXAM: CT HEAD WITHOUT CONTRAST CT CERVICAL SPINE WITHOUT CONTRAST TECHNIQUE: Multidetector CT imaging of the head and cervical spine was performed following the standard protocol without intravenous contrast. Multiplanar CT image reconstructions of the cervical spine were also generated. COMPARISON:  CT head without contrast 02/06/2016. FINDINGS: CT HEAD FINDINGS Brain: Remote encephalomalacia of the left  occipital lobe known bilateral parietal lobes is stable. Remote lacunar infarcts of the basal ganglia are stable bilaterally. Remote lacunar infarct  is again seen in the right thalamus. No acute cortical infarct, hemorrhage, or mass lesion is present. The insular ribbon is intact bilaterally. The basal ganglia are stable. The brainstem and cerebellum are within normal limits. Vascular: Dense calcifications are present within the cavernous internal carotid arteries in the left vertebral artery. There is no hyperdense vessel. Skull: Calvarium is intact. No focal lytic or blastic lesions are present. Sinuses/Orbits: Remote left orbital blowout fracture is stable. The paranasal sinuses and mastoid air cells are otherwise within normal limits. Globes and orbits are otherwise normal. CT CERVICAL SPINE FINDINGS Alignment: Grade 1 anterolisthesis at C2-3 is degenerative. AP alignment is otherwise anatomic. Skull base and vertebrae: The craniocervical junction is normal. Marked degenerative changes are again seen at C1-2. Vertebral body heights are normal. No acute or healing fracture is present. Soft tissues and spinal canal: Vascular calcifications are present bilaterally, most prominent at the right carotid bifurcation. There are calcifications along the left vertebral artery. These stable. Disc levels: Central canal stenosis is most severe at C3-4 with prominent posterior calcifications narrow the canal. Foraminal narrowing is also greatest at C3-4 without significant change. There is left-sided foraminal disease at C5-6 and C6-7 similar the prior study as well. Upper chest: The lung apices are clear. IMPRESSION: 1. No acute intracranial abnormality or significant interval change. 2. Aspects 10/10 3. 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 4. Stable remote lacunar infarcts of the basal ganglia. 5. Extensive vascular calcifications. 6. Stable multilevel spondylosis cervical spine without acute abnormality. 7.  Aortic Atherosclerosis (ICD10-I70.0). These results were called by telephone at the time of interpretation on  06/25/2017 at 6:10 pm to Dr. Bethann Berkshire , who verbally acknowledged these results. Electronically Signed   By: Marin Roberts M.D.   On: 06/25/2017 18:10   Mr Brain Wo Contrast  Result Date: 06/27/2017 CLINICAL DATA:  Initial evaluation for acute slurred speech with right facial droop, TIA. EXAM: MRI HEAD WITHOUT CONTRAST TECHNIQUE: Multiplanar, multiecho pulse sequences of the brain and surrounding structures were obtained without intravenous contrast. COMPARISON:  Prior CT and CTA from 06/25/2017. FINDINGS: Brain: Moderately advanced cerebral atrophy. Patchy and confluent T2/FLAIR hyperintensity throughout the periventricular and deep white matter both cerebral hemispheres, consistent with chronic microvascular ischemic changes. Multiple remote lacunar infarcts seen involving the bilateral basal ganglia and right thalamus. Additional remote small vessel infarct present at the left paramedian pons. Remote bilateral PCA territory infarcts. Associated laminar necrosis noted about the chronic right PCA infarct. Chronic hemosiderin staining seen about several of these infarcts, consistent with chronic blood products. Bifrontal encephalomalacia suspected to be related to remote trauma, although sequelae of prior ischemia could also be considered. 2 cm focus of restricted diffusion seen involving the posterior limb of the right internal capsule, consistent with acute ischemic infarct (series 4, image 25). No associated hemorrhage or mass effect. No other evidence for acute or subacute ischemia. No mass lesion, midline shift, or mass effect. No hydrocephalus. No extra-axial fluid collection. Major dural sinuses are grossly patent. Pituitary gland suprasellar region normal. Midline structures intact. Vascular: Abnormal flow void within the right ICA to the level of the terminus, consistent with previous identified occlusion. Hypoplastic right vertebral artery poorly visualized. Major intracranial vascular flow  voids otherwise maintained. Skull and upper cervical spine: Craniocervical junction normal. Upper cervical spine grossly within normal limits. Bone  marrow signal intensity somewhat mottled in appearance without discrete osseous lesion. No scalp soft tissue abnormality. Sinuses/Orbits: Globes and orbital soft tissues within normal limits. Remote defect noted at the left lamina for pre shaft. Paranasal sinuses are largely clear. Trace bilateral mastoid effusions, of doubtful significance. Inner ear structures normal. Other: None. IMPRESSION: 1. 2 cm acute ischemic nonhemorrhagic infarct involving the posterior limb of the right internal capsule. 2. Multiple remote infarcts involving the bilateral basal ganglia, right thalamus, and left paramedian pons, with additional remote bilateral PCA territory infarcts. Additional areas of bifrontal encephalomalacia favored to be posttraumatic in nature given location and morphology. 3. 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 4. Moderate cerebral atrophy with chronic microvascular ischemic disease. Electronically Signed   By: Rise Mu M.D.   On: 06/27/2017 00:32   Ct Head Code Stroke Wo Contrast  Result Date: 06/25/2017 CLINICAL DATA:  Acute onset of slurred speech and right-sided facial droop. Last seen normal 9 hours ago. EXAM: CT HEAD WITHOUT CONTRAST CT CERVICAL SPINE WITHOUT CONTRAST TECHNIQUE: Multidetector CT imaging of the head and cervical spine was performed following the standard protocol without intravenous contrast. Multiplanar CT image reconstructions of the cervical spine were also generated. COMPARISON:  CT head without contrast 02/06/2016. FINDINGS: CT HEAD FINDINGS Brain: Remote encephalomalacia of the left occipital lobe known bilateral parietal lobes is stable. Remote lacunar infarcts of the basal ganglia are stable bilaterally. Remote lacunar infarct is again seen in the right thalamus. No acute cortical  infarct, hemorrhage, or mass lesion is present. The insular ribbon is intact bilaterally. The basal ganglia are stable. The brainstem and cerebellum are within normal limits. Vascular: Dense calcifications are present within the cavernous internal carotid arteries in the left vertebral artery. There is no hyperdense vessel. Skull: Calvarium is intact. No focal lytic or blastic lesions are present. Sinuses/Orbits: Remote left orbital blowout fracture is stable. The paranasal sinuses and mastoid air cells are otherwise within normal limits. Globes and orbits are otherwise normal. CT CERVICAL SPINE FINDINGS Alignment: Grade 1 anterolisthesis at C2-3 is degenerative. AP alignment is otherwise anatomic. Skull base and vertebrae: The craniocervical junction is normal. Marked degenerative changes are again seen at C1-2. Vertebral body heights are normal. No acute or healing fracture is present. Soft tissues and spinal canal: Vascular calcifications are present bilaterally, most prominent at the right carotid bifurcation. There are calcifications along the left vertebral artery. These stable. Disc levels: Central canal stenosis is most severe at C3-4 with prominent posterior calcifications narrow the canal. Foraminal narrowing is also greatest at C3-4 without significant change. There is left-sided foraminal disease at C5-6 and C6-7 similar the prior study as well. Upper chest: The lung apices are clear. IMPRESSION: 1. No acute intracranial abnormality or significant interval change. 2. Aspects 10/10 3. 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 4. Stable remote lacunar infarcts of the basal ganglia. 5. Extensive vascular calcifications. 6. Stable multilevel spondylosis cervical spine without acute abnormality. 7.  Aortic Atherosclerosis (ICD10-I70.0). These results were called by telephone at the time of interpretation on 06/25/2017 at 6:10 pm to Dr. Bethann Berkshire , who verbally  acknowledged these results. Electronically Signed   By: Marin Roberts M.D.   On: 06/25/2017 18:10   Scheduled Meds: . aspirin EC  81 mg Oral Daily  . atorvastatin  80 mg Oral q1800  . warfarin  5 mg Oral ONCE-1800  . Warfarin - Pharmacist Dosing Inpatient  Does not apply q1800   Continuous Infusions:   Pamella Pert, MD, PhD [facility] Pager [phone]  If 7PM-7AM, please contact night-coverage [email or web address] Password [email or web address] 06/27/2017, 11:17 AM

## 2017-06-27 NOTE — Progress Notes (Signed)
ANTICOAGULATION CONSULT NOTE  Pharmacy Consult for warfarin Indication: atrial fibrillation  No Known Allergies  Patient Measurements: Weight: 125 lb 14.1 oz (57.1 kg)   Vital Signs: Temp: 97.7 F (36.5 C) (01/02 0553) Temp Source: Oral (01/02 0553) BP: 125/52 (01/02 0553) Pulse Rate: 70 (01/02 0553)  Labs: Recent Labs    06/25/17 1749 06/25/17 1802 06/27/17 0532  HGB 15.6 15.0  --   HCT 44.9 44.0  --   PLT 172  --   --   APTT 28  --   --   LABPROT 13.9  --  16.7*  INR 1.08  --  1.37  CREATININE 1.08 1.00  --     CrCl cannot be calculated (Unknown ideal weight.).   Medical History: Past Medical History:  Diagnosis Date  . Atrial fibrillation (HCC)    perioperative  . CAD (coronary artery disease)   . Carotid stenosis    bilateral  . COPD (chronic obstructive pulmonary disease) (HCC)   . Critical lower limb ischemia   . Diabetes mellitus   . DM (diabetes mellitus) (HCC) 01/15/2012  . Dyslipidemia   . H/O ascending aorta repair   . Hematuria 01/15/2012  . Hypertension   . Ischemic cardiomyopathy   . Left ventricular apical thrombus following MI, NSTEMI, 01/13/12 01/15/2012  . LV (left ventricular) mural thrombus    inferapical  . S/P CABG x 3 01/22/2012   LIMA to LAD,SVG to second diagonal,SVG to distal RCA  . Staghorn renal calculus 01/15/2012  . Stroke (HCC)   . Thrombocytopenia (HCC)     Medications:  Medications Prior to Admission  Medication Sig Dispense Refill Last Dose  . acetaminophen (TYLENOL) 500 MG tablet Take 1,000 mg by mouth every 6 (six) hours as needed for pain.    06/24/2017 at Unknown time  . aspirin EC 81 MG tablet Take 81 mg by mouth daily.   Past Week at Unknown time  . albuterol (PROVENTIL HFA;VENTOLIN HFA) 108 (90 BASE) MCG/ACT inhaler Inhale 2 puffs into the lungs every 6 (six) hours as needed for wheezing. (Patient not taking: Reported on 06/11/2017) 1 Inhaler 6 Not Taking at Unknown time  . atorvastatin (LIPITOR) 20 MG tablet TAKE  ONE TABLET BY MOUTH ONCE DAILY (Patient not taking: Reported on 06/11/2017) 30 tablet 0 Not Taking at Unknown time  . ipratropium (ATROVENT HFA) 17 MCG/ACT inhaler Inhale 2 puffs into the lungs every 6 (six) hours as needed for wheezing. (Patient not taking: Reported on 06/11/2017) 1 Inhaler 0 Not Taking at Unknown time  . lisinopril (PRINIVIL,ZESTRIL) 5 MG tablet TAKE ONE TABLET BY MOUTH ONCE DAILY (Patient not taking: Reported on 06/11/2017) 30 tablet 0 Not Taking at Unknown time  . NITROSTAT 0.4 MG SL tablet DISSOLVE ONE TABLET UNDER THE TONGUE EVERY 5 MINUTES AS NEEDED FOR CHEST PAIN.  DO NOT EXCEED A TOTAL OF 3 DOSES IN 15 MINUTES NOW (Patient not taking: Reported on 06/11/2017) 25 tablet 2 Not Taking at Unknown time  . traMADol (ULTRAM) 50 MG tablet TAKE ONE TABLET BY MOUTH EVERY 6 HOURS AS NEEDED FOR MODERATE PAIN (Patient not taking: Reported on 02/06/2016) 60 tablet 0 Not Taking at Unknown time  . warfarin (COUMADIN) 5 MG tablet TAKE ONE TABLET BY MOUTH ONCE DAILY OR AS  DIRECTED  BY  COUMADIN  CLINIC (Patient not taking: Reported on 06/11/2017) 30 tablet 2 Not Taking at Unknown time   Scheduled:  . aspirin EC  81 mg Oral Daily  . atorvastatin  80  mg Oral q1800  . lisinopril  5 mg Oral Daily  . Warfarin - Pharmacist Dosing Inpatient   Does not apply q1800    Assessment: 47 yoM with PMH Afib, severe PAD, prior CVA, CAD sp CABG, L CEA, HTN, prior aortobifemoral bypass, admitted for stroke Sx but not a TPA candidate. Pt upposed to be taking warfarin for AFib but does not appear to be PTA, and INR ~1.0. Txfer to [facility] for neuro workup; Neuro OK to have pharmacy resume warfarin while admitted, no bridging needed. No bleed documented.  Baseline INR 1.08 - up to 1.37 this AM. CBC wnl on 12/31 Prior anticoagulation: warfarin 5 mg daily, LD unknown (not recent)  Goal of Therapy: INR 2-3  Plan: Warfarin 5 mg PO x 1 Daily INR Monitor cbc, for signs of bleeding  Babs Bertin, PharmD,  BCPS Clinical Pharmacist Clinical phone for 06/27/2017 until 3:30pm: [phone] If after 3:30pm, please call main pharmacy at: [phone] 06/27/2017 10:14 AM

## 2017-06-27 NOTE — Progress Notes (Signed)
  Echocardiogram 2D Echocardiogram has been performed.  Zvi Duplantis G Adora Yeh 06/27/2017, 12:51 PM

## 2017-06-27 NOTE — Progress Notes (Signed)
  Speech Language Pathology Treatment: Dysphagia  Patient Details Name: Charles Hatfield MRN: [id number] DOB: 1941/06/07 Today's Date: 06/27/2017 Time: 3825-0539 SLP Time Calculation (min) (ACUTE ONLY): 25 min  Assessment / Plan / Recommendation Clinical Impression  Pt demonstrates tolerance of nectar thick liquids today, when given with tactile support to right lower lip for adequate seal as well as tactile cues for small single sips. Pt is drooling at baseline and coughing on secretions. Will proceed with objective testing tomorrow to determine best diet and strategies/interventions. Pt to continue puree and honey thick liquids for now.   HPI HPI: Patient is a 77 y.o. male with PMH: severe PAD, prior CVA, CAD s/p CABG, HTN, L CEA who presented to the ED with son, who reported patient with new onset slurred speech and right facial droop. Head CT did not reveal any acute abnormality, with stable remote infarcts involving left occipital lobe, bilateral parietal lobes, lacunar infarcts of basal ganglia.      SLP Plan  Continue with current plan of care       Recommendations  Diet recommendations: Dysphagia 1 (puree);Honey-thick liquid Liquids provided via: Cup Medication Administration: Crushed with puree Supervision: Staff to assist with self feeding Compensations: Minimize environmental distractions;Small sips/bites;Slow rate Postural Changes and/or Swallow Maneuvers: Seated upright 90 degrees                Oral Care Recommendations: Oral care BID Follow up Recommendations: Skilled Nursing facility Plan: Continue with current plan of care       GO               [facility], [place] CCC-SLP [phone]  Claudine Mouton 06/27/2017, 2:35 PM

## 2017-06-28 ENCOUNTER — Inpatient Hospital Stay (HOSPITAL_COMMUNITY)
Admission: RE | Admit: 2017-06-28 | Discharge: 2017-07-13 | DRG: 057 | Disposition: A | Discharge status: Home Health | Payer: Medicare Other | Source: Intra-hospital | Attending: Physical Medicine & Rehabilitation | Admitting: Physical Medicine & Rehabilitation

## 2017-06-28 ENCOUNTER — Encounter (HOSPITAL_COMMUNITY): Payer: Self-pay | Admitting: *Deleted

## 2017-06-28 ENCOUNTER — Other Ambulatory Visit: Payer: Self-pay

## 2017-06-28 ENCOUNTER — Inpatient Hospital Stay (HOSPITAL_COMMUNITY): Payer: Medicare Other

## 2017-06-28 DIAGNOSIS — F0280 Dementia in other diseases classified elsewhere without behavioral disturbance: Secondary | ICD-10-CM | POA: Diagnosis present

## 2017-06-28 DIAGNOSIS — E785 Hyperlipidemia, unspecified: Secondary | ICD-10-CM | POA: Diagnosis present

## 2017-06-28 DIAGNOSIS — I482 Chronic atrial fibrillation: Secondary | ICD-10-CM | POA: Diagnosis not present

## 2017-06-28 DIAGNOSIS — R059 Cough, unspecified: Secondary | ICD-10-CM

## 2017-06-28 DIAGNOSIS — I2510 Atherosclerotic heart disease of native coronary artery without angina pectoris: Secondary | ICD-10-CM | POA: Diagnosis present

## 2017-06-28 DIAGNOSIS — Z951 Presence of aortocoronary bypass graft: Secondary | ICD-10-CM | POA: Diagnosis not present

## 2017-06-28 DIAGNOSIS — R1310 Dysphagia, unspecified: Secondary | ICD-10-CM | POA: Diagnosis present

## 2017-06-28 DIAGNOSIS — R32 Unspecified urinary incontinence: Secondary | ICD-10-CM | POA: Diagnosis present

## 2017-06-28 DIAGNOSIS — Z87891 Personal history of nicotine dependence: Secondary | ICD-10-CM

## 2017-06-28 DIAGNOSIS — I255 Ischemic cardiomyopathy: Secondary | ICD-10-CM | POA: Diagnosis present

## 2017-06-28 DIAGNOSIS — I69351 Hemiplegia and hemiparesis following cerebral infarction affecting right dominant side: Principal | ICD-10-CM

## 2017-06-28 DIAGNOSIS — I252 Old myocardial infarction: Secondary | ICD-10-CM

## 2017-06-28 DIAGNOSIS — I69319 Unspecified symptoms and signs involving cognitive functions following cerebral infarction: Secondary | ICD-10-CM

## 2017-06-28 DIAGNOSIS — F0150 Vascular dementia without behavioral disturbance: Secondary | ICD-10-CM | POA: Diagnosis present

## 2017-06-28 DIAGNOSIS — R471 Dysarthria and anarthria: Secondary | ICD-10-CM | POA: Diagnosis present

## 2017-06-28 DIAGNOSIS — E1151 Type 2 diabetes mellitus with diabetic peripheral angiopathy without gangrene: Secondary | ICD-10-CM | POA: Diagnosis present

## 2017-06-28 DIAGNOSIS — Z9119 Patient's noncompliance with other medical treatment and regimen: Secondary | ICD-10-CM

## 2017-06-28 DIAGNOSIS — I5022 Chronic systolic (congestive) heart failure: Secondary | ICD-10-CM | POA: Diagnosis present

## 2017-06-28 DIAGNOSIS — I69391 Dysphagia following cerebral infarction: Secondary | ICD-10-CM

## 2017-06-28 DIAGNOSIS — J449 Chronic obstructive pulmonary disease, unspecified: Secondary | ICD-10-CM | POA: Diagnosis present

## 2017-06-28 DIAGNOSIS — R4701 Aphasia: Secondary | ICD-10-CM | POA: Diagnosis present

## 2017-06-28 DIAGNOSIS — I6340 Cerebral infarction due to embolism of unspecified cerebral artery: Secondary | ICD-10-CM | POA: Diagnosis present

## 2017-06-28 DIAGNOSIS — R29810 Facial weakness: Secondary | ICD-10-CM | POA: Diagnosis present

## 2017-06-28 DIAGNOSIS — I110 Hypertensive heart disease with heart failure: Secondary | ICD-10-CM | POA: Diagnosis present

## 2017-06-28 DIAGNOSIS — Z8673 Personal history of transient ischemic attack (TIA), and cerebral infarction without residual deficits: Secondary | ICD-10-CM

## 2017-06-28 DIAGNOSIS — I6349 Cerebral infarction due to embolism of other cerebral artery: Secondary | ICD-10-CM

## 2017-06-28 DIAGNOSIS — I10 Essential (primary) hypertension: Secondary | ICD-10-CM | POA: Diagnosis not present

## 2017-06-28 DIAGNOSIS — I63133 Cerebral infarction due to embolism of bilateral carotid arteries: Secondary | ICD-10-CM

## 2017-06-28 DIAGNOSIS — I63231 Cerebral infarction due to unspecified occlusion or stenosis of right carotid arteries: Secondary | ICD-10-CM

## 2017-06-28 DIAGNOSIS — I4891 Unspecified atrial fibrillation: Secondary | ICD-10-CM | POA: Diagnosis present

## 2017-06-28 DIAGNOSIS — I6312 Cerebral infarction due to embolism of basilar artery: Secondary | ICD-10-CM | POA: Diagnosis not present

## 2017-06-28 DIAGNOSIS — R05 Cough: Secondary | ICD-10-CM | POA: Diagnosis not present

## 2017-06-28 DIAGNOSIS — Z7901 Long term (current) use of anticoagulants: Secondary | ICD-10-CM | POA: Diagnosis not present

## 2017-06-28 LAB — GLUCOSE, CAPILLARY
Glucose-Capillary: 91 mg/dL (ref 65–99)
Glucose-Capillary: 112 mg/dL — ABNORMAL HIGH (ref 65–99)

## 2017-06-28 LAB — PROTIME-INR
INR: 1.87
PROTHROMBIN TIME: 21.4 s — AB (ref 11.4–15.2)

## 2017-06-28 LAB — BASIC METABOLIC PANEL
Anion gap: 6 (ref 5–15)
BUN: 18 mg/dL (ref 6–20)
CO2: 26 mmol/L (ref 22–32)
CREATININE: 1.21 mg/dL (ref 0.61–1.24)
Calcium: 8.6 mg/dL — ABNORMAL LOW (ref 8.9–10.3)
Chloride: 106 mmol/L (ref 101–111)
GFR calc Af Amer: >60 mL/min (ref >60)
GFR calc non Af Amer: 56 mL/min — ABNORMAL LOW (ref >60)
Glucose, Bld: 87 mg/dL (ref 65–99)
Potassium: 4.1 mmol/L (ref 3.5–5.1)
Sodium: 138 mmol/L (ref 135–145)

## 2017-06-28 MED ORDER — ASPIRIN EC 81 MG PO TBEC
81.0000 mg | DELAYED_RELEASE_TABLET | Freq: Every day | ORAL | Status: DC
  Administered 2017-06-29 – 2017-06-30 (×2): 81 mg via ORAL
  Filled 2017-06-28 (×3): qty 1

## 2017-06-28 MED ORDER — ALBUTEROL SULFATE (2.5 MG/3ML) 0.083% IN NEBU: 3.0000 mL | INHALATION_SOLUTION | Freq: Four times a day (QID) | RESPIRATORY_TRACT | Status: DC | PRN

## 2017-06-28 MED ORDER — ACETAMINOPHEN 160 MG/5ML PO SOLN: 650.0000 mg | ORAL | Status: DC | PRN

## 2017-06-28 MED ORDER — SORBITOL 70 % SOLN
30.0000 mL | Freq: Every day | Status: DC | PRN
  Administered 2017-07-01 – 2017-07-05 (×2): 30 mL via ORAL
  Filled 2017-06-28 (×2): qty 30

## 2017-06-28 MED ORDER — WARFARIN SODIUM 2.5 MG PO TABS
2.5000 mg | ORAL_TABLET | Freq: Once | ORAL | Status: DC
  Administered 2017-06-28: 2.5 mg via ORAL
  Filled 2017-06-28: qty 1

## 2017-06-28 MED ORDER — RESOURCE THICKENUP CLEAR PO POWD
ORAL | Status: DC | PRN
  Filled 2017-06-28 (×3): qty 125

## 2017-06-28 MED ORDER — WARFARIN - PHARMACIST DOSING INPATIENT
Freq: Every day | Status: DC
  Administered 2017-06-29 – 2017-07-08 (×5)

## 2017-06-28 MED ORDER — ACETAMINOPHEN 325 MG PO TABS
650.0000 mg | ORAL_TABLET | ORAL | Status: DC | PRN
  Administered 2017-06-28 – 2017-07-01 (×3): 650 mg via ORAL
  Filled 2017-06-28 (×5): qty 2

## 2017-06-28 MED ORDER — ONDANSETRON HCL 4 MG PO TABS: 4.0000 mg | ORAL_TABLET | Freq: Four times a day (QID) | ORAL | Status: DC | PRN

## 2017-06-28 MED ORDER — WARFARIN SODIUM 2.5 MG PO TABS: 2.5000 mg | ORAL_TABLET | Freq: Once | ORAL | Status: DC

## 2017-06-28 MED ORDER — ACETAMINOPHEN 650 MG RE SUPP: 650.0000 mg | RECTAL | Status: DC | PRN

## 2017-06-28 MED ORDER — ONDANSETRON HCL 4 MG/2ML IJ SOLN: 4.0000 mg | Freq: Four times a day (QID) | INTRAMUSCULAR | Status: DC | PRN

## 2017-06-28 MED ORDER — SENNOSIDES-DOCUSATE SODIUM 8.6-50 MG PO TABS
1.0000 | ORAL_TABLET | Freq: Every evening | ORAL | Status: DC | PRN
  Administered 2017-06-28 – 2017-07-05 (×2): 1 via ORAL
  Filled 2017-06-28 (×3): qty 1

## 2017-06-28 MED ORDER — ATORVASTATIN CALCIUM 80 MG PO TABS
80.0000 mg | ORAL_TABLET | Freq: Every day | ORAL | Status: DC
  Administered 2017-06-29 – 2017-07-12 (×14): 80 mg via ORAL
  Filled 2017-06-28 (×14): qty 1

## 2017-06-28 MED ORDER — WARFARIN SODIUM 5 MG PO TABS: ORAL_TABLET | ORAL | 2 refills | Status: DC

## 2017-06-28 NOTE — Progress Notes (Signed)
Occupational Therapy Treatment Patient Details Name: Charles Hatfield MRN: [id number] DOB: [birth year] Today's Date: 06/28/2017    History of present illness Patient is a 77 y/o male who presents with right facial droop and dysarthria. NIH 7. Unwitnessed fall at home and 3 week history of right sided visual deficits. MRI- acute infarct posterior limb of the internal capsule. CTA- occlusion right common carotid- maybe chronic. PMH includes CVA, PAD s/p aortobifemoral bypass and left CEA, intracardiac thrombus nonadherent to coumadin, CABG, HTN, DM, COPD, A-fib, CAD.    OT comments  Pt making good progress toward OT goals this session. Able to perform functional mobility with mod assist +2 and grooming in standing with mod assist. Pt with apparent visual deficits during functional tasks requiring max cues for object identification and and visual scanning. Pt with R and L sided visual field deficits noted; it is difficult to assess due to cognitive and communication deficits. Continue to recommend CIR. Will continue to follow acutely.   Follow Up Recommendations  CIR;Supervision/Assistance - 24 hour    Equipment Recommendations  Other (comment)(TBD at next venue)    Recommendations for Other Services      Precautions / Restrictions Precautions Precautions: Fall Restrictions Weight Bearing Restrictions: No       Mobility Bed Mobility               General bed mobility comments: Pt sitting EOB with PT upon arrival.  Transfers Overall transfer level: Needs assistance Equipment used: 2 person hand held assist Transfers: Sit to/from Stand Sit to Stand: Min assist;+2 physical assistance         General transfer comment: Cues for hand placement and technique. Min assist +2 for sit to stand from EOB x1 and chair x1.    Balance Overall balance assessment: Needs assistance Sitting-balance support: Feet supported Sitting balance-Leahy Scale: Fair     Standing balance support:  No upper extremity supported;During functional activity Standing balance-Leahy Scale: Poor Standing balance comment: Min assist for static standing without UE support                           ADL either performed or assessed with clinical judgement   ADL Overall ADL's : Needs assistance/impaired     Grooming: Moderate assistance;Standing;Wash/dry hands Grooming Details (indicate cue type and reason): Assist for balance and for identifying ADL items for task                 Toilet Transfer: Moderate assistance;+2 for physical assistance;+2 for safety/equipment;Ambulation(HHA) Toilet Transfer Details (indicate cue type and reason): Simulated by functional mobility         Functional mobility during ADLs: Moderate assistance;+2 for physical assistance;+2 for safety/equipment(HHA)       Vision   Vision Assessment?: Vision impaired- to be further tested in functional context Additional Comments: Pt requires cues to identify objects in R and L visual field. Pt able to identify # of fingers held up but unable to identify functional objects. In R visual field pt reports he cannot see and is visually overshooting to his R.   Perception     Praxis      Cognition Arousal/Alertness: Awake/alert Behavior During Therapy: WFL for tasks assessed/performed Overall Cognitive Status: Difficult to assess  Exercises     Shoulder Instructions       General Comments      Pertinent Vitals/ Pain       Pain Assessment: No/denies pain  Home Living                                          Prior Functioning/Environment              Frequency  Min 2X/week        Progress Toward Goals  OT Goals(current goals can now be found in the care plan section)  Progress towards OT goals: Progressing toward goals  Acute Rehab OT Goals Patient Stated Goal: to return to independence OT Goal  Formulation: With patient  Plan Discharge plan remains appropriate    Co-evaluation    PT/OT/SLP Co-Evaluation/Treatment: Yes Reason for Co-Treatment: Complexity of the patient's impairments (multi-system involvement);Necessary to address cognition/behavior during functional activity;For patient/therapist safety;To address functional/ADL transfers   OT goals addressed during session: ADL's and self-care      AM-PAC PT "6 Clicks" Daily Activity     Outcome Measure   Help from another person eating meals?: A Little Help from another person taking care of personal grooming?: A Lot Help from another person toileting, which includes using toliet, bedpan, or urinal?: A Lot Help from another person bathing (including washing, rinsing, drying)?: A Lot Help from another person to put on and taking off regular upper body clothing?: A Little Help from another person to put on and taking off regular lower body clothing?: A Lot 6 Click Score: 14    End of Session Equipment Utilized During Treatment: Gait belt  OT Visit Diagnosis: Unsteadiness on feet (R26.81);Other abnormalities of gait and mobility (R26.89);Muscle weakness (generalized) (M62.81)   Activity Tolerance Patient tolerated treatment well   Patient Left in chair;with call bell/phone within reach;with chair alarm set   Nurse Communication          Time: 1610-9604 OT Time Calculation (min): 26 min  Charges: OT General Charges $OT Visit: 1 Visit OT Treatments $Self Care/Home Management : 8-22 mins  Tennyson Wacha A. Brett Albino, M.S., OTR/L Pager: [phone]   Gaye Alken 06/28/2017, 1:34 PM

## 2017-06-28 NOTE — Care Management Note (Signed)
Case Management Note  Patient Details  Name: Charles Hatfield MRN: [id number] Date of Birth: [birth year]  Subjective/Objective:                    Action/Plan: Pt discharging to CIR today. CM found that patient and his son are staying at the [facility] in [place]. CM spoke to Casimiro Needle and he says the patient does not have a PCP. CM unable to set up currently since not sure how long he will be in CIR.  CM also consulted about medications for the patient. Per Casimiro Needle the patient can afford his medications he had just chosen to stop taking them.   Expected Discharge Date:  06/28/17               Expected Discharge Plan:  IP Rehab Facility  In-House Referral:     Discharge planning Services  CM Consult  Post Acute Care Choice:    Choice offered to:     DME Arranged:    DME Agency:     HH Arranged:    HH Agency:     Status of Service:  Completed, signed off  If discussed at [employer], dates discussed:    Additional Comments:  Kermit Balo, RN 06/28/2017, 3:21 PM

## 2017-06-28 NOTE — Discharge Summary (Signed)
Physician Discharge Summary  Charles Hatfield ZOX:[id number] DOB: February 12, 1941 DOA: 06/25/2017  PCP: Default, Provider, MD  Admit date: 06/25/2017 Discharge date: 06/28/2017  Admitted From: home Charles Hatfield  Disposition:  CIR>   Recommendations for Outpatient Follow-up:  1. Follow up with PCP in 1-2 weeks 2. Please obtain BMP/CBC in one week 3. Please check INR and adjust coumadin as needed.      Discharge Condition: Stable.  CODE STATUS: full code.  Diet recommendation: Dysphagia 2  Brief/Interim Summary:  Charles Hatfield [facility] a77 y.o.malewith a history of PADs/p aortobifemoral bypass and left CEA, CVA, CAD s/p CABG,intracardiac thrombus nonadherent to coumadin, and HTN who presented to the ED for new slurred speech and right facial droop for several hours. He also had an unwitnessed fall at home in the bathroom, and had a 3 week history of right-sided vision deficits. Initial CT in the ED demonstrated stable remote infarcts in bilateral parietal and left occipital lobes, remote basal ganglia lacunes and no acute findings. Teleneurology was consulted, felt he was not tPA candidate due to outside window. CTA head and neck showed time-indeterminate occlusion of the right common carotid artery and extensive atherosclerosis.  MRI was positive for stroke.  Assessment & Plan: Principal Problem:   Acute CVA (cerebrovascular accident) [facility]) Active Problems:   HTN (hypertension)   Left ventricular apical thrombus following MI, NSTEMI, 01/13/12   CAD (coronary artery disease) status post CABG July 2013   COPD (chronic obstructive pulmonary disease) (HCC)   Ischemic cardiomyopathy, EF 40-45% 2D 01/15/12   History of stroke   S/P carotid endarterectomy   Lt, 01/23/12   CKD (chronic kidney disease) stage 3, GFR 30-59 ml/min (HCC)   Mixed hyperlipidemia   Right hemiparesis (HCC)   Dysarthria   Dysphagia   Long term (current) use of anticoagulants   CVA -Neurology consulted, appreciate  input.  Patient already on Coumadin, continue per pharmacy.  Patient with profound dysarthria, speech therapy evaluated on 1/1 and recommended dysphagia 1 with honey thick liquids.  Concern for increased work of breathing on 1/2 , suspect patient also has silent aspiration, please elevate head of bed at 30 degrees. Chest x ray; no active diseases. Respiratory status stable.  -Continue aspirin -2D echocardiogram EF 45 %  -Hemoglobin A1c 4.9 -Lipid panel with LDL 117, continue atorvastatin at 80 mg -Physical therapy recommended CIR. Awaiting bed  Diffuse atherosclerotic disease including PAD, carotid stenosis and CVD as above.  - ASA, BP and CBG control.   CAD s/p CABG and ischemic cardiomyopathy  -No chest pain/dyspnea. Troponin neg. Euvolemic -Aspirin.  -Per cardiology note, beta blockers avoided due to COPD.   Chronic systolic CHF -most recent 2D echo back in 2014 showed an EF of 40-45% -Patient has no lower extremity edema, chest x-ray is pending but appears euvolemic -2D echo EF 45 % no thrombus.   COPD -Stable, no exacerbation currently.  -Continue prn bronchodilators      Discharge Diagnoses:  Principal Problem:   CVA (cerebral vascular accident) (HCC) Active Problems:   HTN (hypertension)   Left ventricular apical thrombus following MI, NSTEMI, 01/13/12   CAD (coronary artery disease) status post CABG July 2013   COPD (chronic obstructive pulmonary disease) (HCC)   Ischemic cardiomyopathy, EF 40-45% 2D 01/15/12   History of stroke   S/P carotid endarterectomy   Lt, 01/23/12   CKD (chronic kidney disease) stage 3, GFR 30-59 ml/min (HCC)   Mixed hyperlipidemia   Right hemiparesis (HCC)   Dysarthria   Dysphagia  Long term (current) use of anticoagulants   Dysphagia, post-stroke   Chronic combined systolic and diastolic CHF (congestive heart failure) (HCC)   Benign essential HTN   PAF (paroxysmal atrial fibrillation) [facility])   Intracardiac thrombosis    Tachypnea    Discharge Instructions  Discharge Instructions    Ambulatory referral to Neurology   Complete by:  As directed    An appointment is requested in approximately: 6 weeks Follow up with stroke clinic Charles Hatfield preferred, if not available, then consider Charles Hatfield, Charles Hatfield or Charles Hatfield whoever is available) at [facility] in about 6-8 weeks. Thanks.     Allergies as of 06/28/2017   No Known Allergies     Medication List    TAKE these medications   acetaminophen 500 MG tablet Commonly known as:  TYLENOL Take 1,000 mg by mouth every 6 (six) hours as needed for pain.   albuterol 108 (90 Base) MCG/ACT inhaler Commonly known as:  PROVENTIL HFA;VENTOLIN HFA Inhale 2 puffs into the lungs every 6 (six) hours as needed for wheezing.   aspirin EC 81 MG tablet Take 81 mg by mouth daily.   atorvastatin 20 MG tablet Commonly known as:  LIPITOR TAKE ONE TABLET BY MOUTH ONCE DAILY   ipratropium 17 MCG/ACT inhaler Commonly known as:  ATROVENT HFA Inhale 2 puffs into the lungs every 6 (six) hours as needed for wheezing.   lisinopril 5 MG tablet Commonly known as:  PRINIVIL,ZESTRIL TAKE ONE TABLET BY MOUTH ONCE DAILY   NITROSTAT 0.4 MG SL tablet Generic drug:  nitroGLYCERIN DISSOLVE ONE TABLET UNDER THE TONGUE EVERY 5 MINUTES AS NEEDED FOR CHEST PAIN.  DO NOT EXCEED A TOTAL OF 3 DOSES IN 15 MINUTES NOW   traMADol 50 MG tablet Commonly known as:  ULTRAM TAKE ONE TABLET BY MOUTH EVERY 6 HOURS AS NEEDED FOR MODERATE PAIN   warfarin 5 MG tablet Commonly known as:  COUMADIN Take as directed. If you are unsure how to take this medication, talk to your nurse or doctor. Original instructions:  Take 2.5 mg daily What changed:  additional instructions      Follow-up Information    Charles Riggs, NP. Schedule an appointment as soon as possible for a visit in 6 week(s).   Specialty:  Family Medicine Contact information: [address] [place]  [address] [phone]          No Known Allergies  Consultations:  Neurology    Procedures/Studies: Ct Angio Head W Or Wo Contrast  Result Date: 06/25/2017 CLINICAL DATA:  Initial evaluation for follow-up stroke. EXAM: CT ANGIOGRAPHY HEAD AND NECK TECHNIQUE: Multidetector CT imaging of the head and neck was performed using the standard protocol during bolus administration of intravenous contrast. Multiplanar CT image reconstructions and MIPs were obtained to evaluate the vascular anatomy. Carotid stenosis measurements (when applicable) are obtained utilizing NASCET criteria, using the distal internal carotid diameter as the denominator. CONTRAST:  75mL ISOVUE-370 IOPAMIDOL (ISOVUE-370) INJECTION 76% COMPARISON:  Prior head CT from earlier the same day. FINDINGS: CTA NECK FINDINGS Aortic arch: Extensive atheromatous plaque throughout the visualized aortic arch. No high-grade stenosis about the origin of the great vessels. Extensive atheromatous regularity throughout the subclavian artery is without high-grade stenosis. Right carotid system: Right common carotid artery is occluded at its mid aspect to the level of the bifurcation. Right ICA entirely included within the neck to the level of the terminus. Opacification of the right external carotid artery and its branches via collateral flow.  Left carotid system: Left common carotid artery demonstrates multifocal atheromatous irregularity but is patent from its origin to the bifurcation without flow-limiting stenosis. No significant atheromatous narrowing about the left bifurcation. Left ICA patent within the neck without flow-limiting stenosis. Vertebral arteries: Both of the vertebral artery is arise from the subclavian artery is. Left vertebral artery dominant. Right vertebral artery diffusely hypoplastic. Atheromatous plaque at the origin of the vertebral arteries bilaterally with moderate to severe stenoses. Extensive multifocal atheromatous  irregularity throughout the vertebral artery is with resultant mild to moderate multifocal stenoses. Right vertebral artery occludes at the cranial vault. Skeleton: No acute osseus abnormality. No worrisome lytic or blastic osseous lesions. Advanced degenerative spondylolysis throughout the cervical spine with bulky anterior osteophytic spurring. Other neck: No acute soft tissue abnormality within the neck. Salivary glands within normal limits. Thyroid normal. No adenopathy. Upper chest: Median sternotomy wires partially visualized. No acute abnormality within the visualized upper chest. Emphysema. Review of the MIP images confirms the above findings CTA HEAD FINDINGS Anterior circulation: The right ICA occluded to the level of the terminus. Multifocal plaque within the petrous left ICA with mild narrowing. Advanced atheromatous plaque throughout the cavernous/ supraclinoid left ICA with resultant moderate multifocal irregularity and narrowing. Left ICA terminus widely patent. A1 segments widely patent without stenosis. Patent anterior communicating artery. Anterior cerebral artery is widely patent to their distal aspects without flow-limiting stenosis. Perfusion of the right ICA terminus via flow cross the circle of Willis. Right M1 segment patent without stenosis. Left M1 segment patent as well. Normal MCA bifurcations. No proximal M2 occlusion. Distal MCA branches well perfused and symmetric. Posterior circulation: Left vertebral artery dominant. Multifocal atheromatous plaque throughout the left V4 segment with moderate multifocal narrowing. Left PICA patent. Hypoplastic right vertebral artery occluded at the cranial vault. Right PICA not visualized. Extensive multifocal atheromatous regularity throughout the basilar artery with moderate to severe stenoses, most notable at its distal aspect at the level of the basilar tip (series 8, image 97). Superior cerebral arteries patent bilaterally. Left PCA supplied via  the basilar artery. Right PCA supplied via a hypoplastic right P1 segment as well as a right posterior communicating artery. 3 mm ovoid blush of contrast at the confluence of the right P1 segment and right P com suspicious for possible small aneurysm (series 8, image 97). Extensive atheromatous irregularity throughout the P2 segments bilaterally the without high-grade stenosis. Distal PCAs not well visualized, and may be occluded given the chronic bilateral PCA territory infarcts. Venous sinuses: Centrally positioned and linear filling defect involving the posterior aspect of the superior sagittal sinus is not seen on 5 minutes delayed sequence, and is favored to reflect a flow related artifact. No evidence for dural sinus thrombosis. Anatomic variants: None significant. Delayed phase: No abnormal enhancement. Review of the MIP images confirms the above findings IMPRESSION: 1. Occlusion of the right common carotid artery at its mid aspect, age indeterminate, and may be chronic given at this would not fit with patient's current symptoms. Right carotid artery system is occluded to the level of the circle of Willis. Distal reconstitution at the level of the right ICA terminus with fairly robust flow throughout the right MCA territory. 2. Extensive atheromatous irregularity throughout the left ICA and MCA without large vessel occlusion. Moderate carotid siphon atherosclerosis without high-grade or correctable stenosis. 3. Extensive atheromatous irregularity throughout the vertebrobasilar system, with moderate to severe multifocal stenoses involving the basilar artery. 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 Distal PCA  branches not well visualized, suspected to be occluded given the chronic bilateral PCA territory infarcts. 4. 3 mm focal outpouching arising at the confluence of the right P1 segment and posterior communicating artery, suspicious for small aneurysm. Critical Value/emergent results  were called by telephone at the time of interpretation on 06/25/2017 at 7:47 pm to Dr. Ferd Glassing , who verbally acknowledged these results. Electronically Signed   By: Rise Mu M.D.   On: 06/25/2017 20:00   Dg Chest 2 View  Result Date: 06/26/2017 CLINICAL DATA:  Acute onset of shortness of breath. Status post CVA. EXAM: CHEST  2 VIEW COMPARISON:  Chest radiograph performed 06/11/2017 FINDINGS: The lungs are well-aerated and clear. There is no evidence of focal opacification, pleural effusion or pneumothorax. The heart is borderline normal in size. The patient is status post median sternotomy, with evidence of prior CABG. No acute osseous abnormalities are seen. IMPRESSION: No acute cardiopulmonary process seen. Electronically Signed   By: Roanna Raider M.D.   On: 06/26/2017 02:51   Dg Chest 2 View  Result Date: 06/11/2017 CLINICAL DATA:  Checking episode.  Choked on a bologna sandwich. EXAM: CHEST  2 VIEW COMPARISON:  Radiograph 04/20/2012 FINDINGS: Post median sternotomy. The cardiomediastinal contours are normal. Again seen calcified granuloma in the right midlung. Pulmonary vasculature is normal. No consolidation, pleural effusion, or pneumothorax. No acute osseous abnormalities are seen. IMPRESSION: No acute abnormality. Electronically Signed   By: Rubye Oaks M.D.   On: 06/11/2017 01:28   Ct Angio Neck W Or Wo Contrast  Result Date: 06/25/2017 CLINICAL DATA:  Initial evaluation for follow-up stroke. EXAM: CT ANGIOGRAPHY HEAD AND NECK TECHNIQUE: Multidetector CT imaging of the head and neck was performed using the standard protocol during bolus administration of intravenous contrast. Multiplanar CT image reconstructions and MIPs were obtained to evaluate the vascular anatomy. Carotid stenosis measurements (when applicable) are obtained utilizing NASCET criteria, using the distal internal carotid diameter as the denominator. CONTRAST:  75mL ISOVUE-370 IOPAMIDOL  (ISOVUE-370) INJECTION 76% COMPARISON:  Prior head CT from earlier the same day. FINDINGS: CTA NECK FINDINGS Aortic arch: Extensive atheromatous plaque throughout the visualized aortic arch. No high-grade stenosis about the origin of the great vessels. Extensive atheromatous regularity throughout the subclavian artery is without high-grade stenosis. Right carotid system: Right common carotid artery is occluded at its mid aspect to the level of the bifurcation. Right ICA entirely included within the neck to the level of the terminus. Opacification of the right external carotid artery and its branches via collateral flow. Left carotid system: Left common carotid artery demonstrates multifocal atheromatous irregularity but is patent from its origin to the bifurcation without flow-limiting stenosis. No significant atheromatous narrowing about the left bifurcation. Left ICA patent within the neck without flow-limiting stenosis. Vertebral arteries: Both of the vertebral artery is arise from the subclavian artery is. Left vertebral artery dominant. Right vertebral artery diffusely hypoplastic. Atheromatous plaque at the origin of the vertebral arteries bilaterally with moderate to severe stenoses. Extensive multifocal atheromatous irregularity throughout the vertebral artery is with resultant mild to moderate multifocal stenoses. Right vertebral artery occludes at the cranial vault. Skeleton: No acute osseus abnormality. No worrisome lytic or blastic osseous lesions. Advanced degenerative spondylolysis throughout the cervical spine with bulky anterior osteophytic spurring. Other neck: No acute soft tissue abnormality within the neck. Salivary glands within normal limits. Thyroid normal. No adenopathy. Upper chest: Median sternotomy wires partially visualized. No acute abnormality within the visualized upper chest. Emphysema. Review of the MIP  images confirms the above findings CTA HEAD FINDINGS Anterior circulation: The  right ICA occluded to the level of the terminus. Multifocal plaque within the petrous left ICA with mild narrowing. Advanced atheromatous plaque throughout the cavernous/ supraclinoid left ICA with resultant moderate multifocal irregularity and narrowing. Left ICA terminus widely patent. A1 segments widely patent without stenosis. Patent anterior communicating artery. Anterior cerebral artery is widely patent to their distal aspects without flow-limiting stenosis. Perfusion of the right ICA terminus via flow cross the circle of Willis. Right M1 segment patent without stenosis. Left M1 segment patent as well. Normal MCA bifurcations. No proximal M2 occlusion. Distal MCA branches well perfused and symmetric. Posterior circulation: Left vertebral artery dominant. Multifocal atheromatous plaque throughout the left V4 segment with moderate multifocal narrowing. Left PICA patent. Hypoplastic right vertebral artery occluded at the cranial vault. Right PICA not visualized. Extensive multifocal atheromatous regularity throughout the basilar artery with moderate to severe stenoses, most notable at its distal aspect at the level of the basilar tip (series 8, image 97). Superior cerebral arteries patent bilaterally. Left PCA supplied via the basilar artery. Right PCA supplied via a hypoplastic right P1 segment as well as a right posterior communicating artery. 3 mm ovoid blush of contrast at the confluence of the right P1 segment and right P com suspicious for possible small aneurysm (series 8, image 97). Extensive atheromatous irregularity throughout the P2 segments bilaterally the without high-grade stenosis. Distal PCAs not well visualized, and may be occluded given the chronic bilateral PCA territory infarcts. Venous sinuses: Centrally positioned and linear filling defect involving the posterior aspect of the superior sagittal sinus is not seen on 5 minutes delayed sequence, and is favored to reflect a flow related  artifact. No evidence for dural sinus thrombosis. Anatomic variants: None significant. Delayed phase: No abnormal enhancement. Review of the MIP images confirms the above findings IMPRESSION: 1. Occlusion of the right common carotid artery at its mid aspect, age indeterminate, and may be chronic given at this would not fit with patient's current symptoms. Right carotid artery system is occluded to the level of the circle of Willis. Distal reconstitution at the level of the right ICA terminus with fairly robust flow throughout the right MCA territory. 2. Extensive atheromatous irregularity throughout the left ICA and MCA without large vessel occlusion. Moderate carotid siphon atherosclerosis without high-grade or correctable stenosis. 3. Extensive atheromatous irregularity throughout the vertebrobasilar system, with moderate to severe multifocal stenoses involving the basilar artery. 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 Distal PCA branches not well visualized, suspected to be occluded given the chronic bilateral PCA territory infarcts. 4. 3 mm focal outpouching arising at the confluence of the right P1 segment and posterior communicating artery, suspicious for small aneurysm. Critical Value/emergent results were called by telephone at the time of interpretation on 06/25/2017 at 7:47 pm to Dr. Ferd Glassing , who verbally acknowledged these results. Electronically Signed   By: Rise Mu M.D.   On: 06/25/2017 20:00   Ct Cervical Spine Wo Contrast  Result Date: 06/25/2017 CLINICAL DATA:  Acute onset of slurred speech and right-sided facial droop. Last seen normal 9 hours ago. EXAM: CT HEAD WITHOUT CONTRAST CT CERVICAL SPINE WITHOUT CONTRAST TECHNIQUE: Multidetector CT imaging of the head and cervical spine was performed following the standard protocol without intravenous contrast. Multiplanar CT image reconstructions of the cervical spine were also generated.  COMPARISON:  CT head without contrast 02/06/2016. FINDINGS: CT HEAD FINDINGS Brain: Remote encephalomalacia of the left  occipital lobe known bilateral parietal lobes is stable. Remote lacunar infarcts of the basal ganglia are stable bilaterally. Remote lacunar infarct is again seen in the right thalamus. No acute cortical infarct, hemorrhage, or mass lesion is present. The insular ribbon is intact bilaterally. The basal ganglia are stable. The brainstem and cerebellum are within normal limits. Vascular: Dense calcifications are present within the cavernous internal carotid arteries in the left vertebral artery. There is no hyperdense vessel. Skull: Calvarium is intact. No focal lytic or blastic lesions are present. Sinuses/Orbits: Remote left orbital blowout fracture is stable. The paranasal sinuses and mastoid air cells are otherwise within normal limits. Globes and orbits are otherwise normal. CT CERVICAL SPINE FINDINGS Alignment: Grade 1 anterolisthesis at C2-3 is degenerative. AP alignment is otherwise anatomic. Skull base and vertebrae: The craniocervical junction is normal. Marked degenerative changes are again seen at C1-2. Vertebral body heights are normal. No acute or healing fracture is present. Soft tissues and spinal canal: Vascular calcifications are present bilaterally, most prominent at the right carotid bifurcation. There are calcifications along the left vertebral artery. These stable. Disc levels: Central canal stenosis is most severe at C3-4 with prominent posterior calcifications narrow the canal. Foraminal narrowing is also greatest at C3-4 without significant change. There is left-sided foraminal disease at C5-6 and C6-7 similar the prior study as well. Upper chest: The lung apices are clear. IMPRESSION: 1. No acute intracranial abnormality or significant interval change. 2. Aspects 10/10 3. 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 4. Stable remote  lacunar infarcts of the basal ganglia. 5. Extensive vascular calcifications. 6. Stable multilevel spondylosis cervical spine without acute abnormality. 7.  Aortic Atherosclerosis (ICD10-I70.0). These results were called by telephone at the time of interpretation on 06/25/2017 at 6:10 pm to Dr. Bethann Berkshire , who verbally acknowledged these results. Electronically Signed   By: Marin Roberts M.D.   On: 06/25/2017 18:10   Mr Brain Wo Contrast  Result Date: 06/27/2017 CLINICAL DATA:  Initial evaluation for acute slurred speech with right facial droop, TIA. EXAM: MRI HEAD WITHOUT CONTRAST TECHNIQUE: Multiplanar, multiecho pulse sequences of the brain and surrounding structures were obtained without intravenous contrast. COMPARISON:  Prior CT and CTA from 06/25/2017. FINDINGS: Brain: Moderately advanced cerebral atrophy. Patchy and confluent T2/FLAIR hyperintensity throughout the periventricular and deep white matter both cerebral hemispheres, consistent with chronic microvascular ischemic changes. Multiple remote lacunar infarcts seen involving the bilateral basal ganglia and right thalamus. Additional remote small vessel infarct present at the left paramedian pons. Remote bilateral PCA territory infarcts. Associated laminar necrosis noted about the chronic right PCA infarct. Chronic hemosiderin staining seen about several of these infarcts, consistent with chronic blood products. Bifrontal encephalomalacia suspected to be related to remote trauma, although sequelae of prior ischemia could also be considered. 2 cm focus of restricted diffusion seen involving the posterior limb of the right internal capsule, consistent with acute ischemic infarct (series 4, image 25). No associated hemorrhage or mass effect. No other evidence for acute or subacute ischemia. No mass lesion, midline shift, or mass effect. No hydrocephalus. No extra-axial fluid collection. Major dural sinuses are grossly patent. Pituitary gland  suprasellar region normal. Midline structures intact. Vascular: Abnormal flow void within the right ICA to the level of the terminus, consistent with previous identified occlusion. Hypoplastic right vertebral artery poorly visualized. Major intracranial vascular flow voids otherwise maintained. Skull and upper cervical spine: Craniocervical junction normal. Upper cervical spine grossly within normal limits. Bone marrow  signal intensity somewhat mottled in appearance without discrete osseous lesion. No scalp soft tissue abnormality. Sinuses/Orbits: Globes and orbital soft tissues within normal limits. Remote defect noted at the left lamina for pre shaft. Paranasal sinuses are largely clear. Trace bilateral mastoid effusions, of doubtful significance. Inner ear structures normal. Other: None. IMPRESSION: 1. 2 cm acute ischemic nonhemorrhagic infarct involving the posterior limb of the right internal capsule. 2. Multiple remote infarcts involving the bilateral basal ganglia, right thalamus, and left paramedian pons, with additional remote bilateral PCA territory infarcts. Additional areas of bifrontal encephalomalacia favored to be posttraumatic in nature given location and morphology. 3. 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 4. Moderate cerebral atrophy with chronic microvascular ischemic disease. Electronically Signed   By: Rise Mu M.D.   On: 06/27/2017 00:32   Dg Chest Port 1 View  Result Date: 06/27/2017 CLINICAL DATA:  Shortness of breath.  Dyspnea. EXAM: PORTABLE CHEST 1 VIEW COMPARISON:  06/26/2017 and 06/11/2017 FINDINGS: The heart size and mediastinal contours are within normal limits. Both lungs are clear. The visualized skeletal structures are unremarkable. CABG. IMPRESSION: No active disease. Electronically Signed   By: Francene Boyers M.D.   On: 06/27/2017 11:50   Dg Swallowing Func-speech Pathology  Result Date: 06/28/2017 Objective Swallowing  Evaluation: Type of Study: MBS-Modified Barium Swallow Study  Patient Details Name: ANTWYNE PINGREE MRN: [id number] Date of Birth: 1940/10/14 Today's Date: 06/28/2017 Time: SLP Start Time (ACUTE ONLY): 0945 -SLP Stop Time (ACUTE ONLY): 1010 SLP Time Calculation (min) (ACUTE ONLY): 25 min Past Medical History: Past Medical History: Diagnosis Date . Atrial fibrillation (HCC)   perioperative . CAD (coronary artery disease)  . Carotid stenosis   bilateral . COPD (chronic obstructive pulmonary disease) (HCC)  . Critical lower limb ischemia  . Diabetes mellitus  . DM (diabetes mellitus) (HCC) 01/15/2012 . Dyslipidemia  . H/O ascending aorta repair  . Hematuria 01/15/2012 . Hypertension  . Ischemic cardiomyopathy  . Left ventricular apical thrombus following MI, NSTEMI, 01/13/12 01/15/2012 . LV (left ventricular) mural thrombus   inferapical . S/P CABG x 3 01/22/2012  LIMA to LAD,SVG to second diagonal,SVG to distal RCA . Staghorn renal calculus 01/15/2012 . Stroke (HCC)  . Thrombocytopenia (HCC)  Past Surgical History: Past Surgical History: Procedure Laterality Date . ABDOMINAL ANGIOGRAM  03/03/2013  Procedure: ABDOMINAL ANGIOGRAM;  Surgeon: Runell Gess, MD;  Location: [facility] CATH LAB;  Service: Cardiovascular;; . CAROTID ENDARTERECTOMY  01/13/2012 . CORONARY ANGIOGRAM  01/16/2012  Procedure: CORONARY ANGIOGRAM;  Surgeon: Thurmon Fair, MD;  Location: [facility] CATH LAB;  Service: Cardiovascular;; . CORONARY ARTERY BYPASS GRAFT  01/22/2012  Procedure: CORONARY ARTERY BYPASS GRAFTING (CABG);  Surgeon: Kerin Perna, MD;  Location: [facility] OR;  Service: Open Heart Surgery;  Laterality: N/A;  CABG x three using left internal mammary artery and right leg greater saphenous vein harvested endoscopically . ENDARTERECTOMY  01/22/2012  Procedure: ENDARTERECTOMY CAROTID;  Surgeon: Larina Earthly, MD;  Location: [facility] OR;  Service: Vascular;  Laterality: Left; . LOWER EXTREMITY ANGIOGRAM Left 03/03/2013  Procedure: LOWER EXTREMITY ANGIOGRAM;  Surgeon: Runell Gess, MD;  Location: [facility] CATH LAB;  Service: Cardiovascular;  Laterality: Left; . NM MYOVIEW LTD  02/20/2012  infarct/scar w/mild perinfarct ischemia, EF 38%, abnormal nuc, no sign. ischemia . REPAIR THORACIC AORTA    Ascending thoracic aorto repair HPI: Patient is a 77 y.o. male with PMH: severe PAD, prior CVA, CAD s/p CABG, HTN, L CEA who presented to  the ED with son, who reported patient with new onset slurred speech and right facial droop. Head CT did not reveal any acute abnormality, with stable remote infarcts involving left occipital lobe, bilateral parietal lobes, lacunar infarcts of basal ganglia. Per MD note pt is living in a motel with his son, does not have adequate resources for pt to take medication regularly.  Subjective: extremely dysarthric and very difficult to understand Assessment / Plan / Recommendation CHL IP CLINICAL IMPRESSIONS 06/28/2017 Clinical Impression Pt demonstrates a pirmary oral dysphagia that impacts pharyngeal function. Pt has significantly right labial and buccal weakness with decreased ability to seal lips and produce adequate intraoral pressures. There is occasional anterior spillage and pooling in the right anterior/buccal cavity. There is piecemeal oral propulsion of bolus with delayed swallow initiation. Pt requries moderate sized bolus to initaite effective swallow. Thin liquids pooling the pharynx prior to swallow are aspirated with hard sensation and ejected. Nectar is tolerated as long as bolus is not overlarge. Pt was able to take moderate cup and straw sips with verbal cues, but is aspiration does occur it is immediately ejected with hard cough. Mild pharyngeal wall weakness, likely on the right side, results in unilateral mild residuals with solids. Will Recommend a dys 2 (fine chopped diet) with nectar thick liquids with interventions to target pharyngeal strength, and oral compensatory strategies.  SLP Visit Diagnosis Dysphagia, oropharyngeal phase  (R13.12);Dysphagia, oral phase (R13.11) Attention and concentration deficit following -- Frontal lobe and executive function deficit following -- Impact on safety and function Moderate aspiration risk   CHL IP TREATMENT RECOMMENDATION 06/28/2017 Treatment Recommendations Therapy as outlined in treatment plan below   Prognosis 06/28/2017 Prognosis for Safe Diet Advancement Good Barriers to Reach Goals -- Barriers/Prognosis Comment -- CHL IP DIET RECOMMENDATION 06/28/2017 SLP Diet Recommendations Dysphagia 2 (Fine chop) solids;Nectar thick liquid Liquid Administration via Cup;Straw Medication Administration -- Compensations Slow rate;Small sips/bites;Multiple dry swallows after each bite/sip;Monitor for anterior loss Postural Changes Seated upright at 90 degrees   CHL IP OTHER RECOMMENDATIONS 06/28/2017 Recommended Consults -- Oral Care Recommendations -- Other Recommendations Order thickener from pharmacy;Clarify dietary restrictions;Prohibited food (jello, ice cream, thin soups)   CHL IP FOLLOW UP RECOMMENDATIONS 06/28/2017 Follow up Recommendations Inpatient Rehab   CHL IP FREQUENCY AND DURATION 06/28/2017 Speech Therapy Frequency (ACUTE ONLY) min 2x/week Treatment Duration 2 weeks      CHL IP ORAL PHASE 06/28/2017 Oral Phase Impaired Oral - Pudding Teaspoon -- Oral - Pudding Cup -- Oral - Honey Teaspoon -- Oral - Honey Cup -- Oral - Nectar Teaspoon -- Oral - Nectar Cup Pocketing in anterior sulcus;Right pocketing in lateral sulci;Right anterior bolus loss;Decreased bolus cohesion;Premature spillage Oral - Nectar Straw Pocketing in anterior sulcus;Right pocketing in lateral sulci;Right anterior bolus loss;Decreased bolus cohesion;Premature spillage Oral - Thin Teaspoon -- Oral - Thin Cup Pocketing in anterior sulcus;Right pocketing in lateral sulci;Right anterior bolus loss;Decreased bolus cohesion;Premature spillage Oral - Thin Straw Pocketing in anterior sulcus;Right pocketing in lateral sulci;Right anterior bolus  loss;Decreased bolus cohesion;Premature spillage Oral - Puree Pocketing in anterior sulcus;Right pocketing in lateral sulci;Right anterior bolus loss;Decreased bolus cohesion;Premature spillage Oral - Mech Soft Pocketing in anterior sulcus;Right pocketing in lateral sulci;Right anterior bolus loss;Decreased bolus cohesion;Premature spillage Oral - Regular -- Oral - Multi-Consistency -- Oral - Pill -- Oral Phase - Comment --  CHL IP PHARYNGEAL PHASE 06/28/2017 Pharyngeal Phase Impaired Pharyngeal- Pudding Teaspoon -- Pharyngeal -- Pharyngeal- Pudding Cup -- Pharyngeal -- Pharyngeal- Honey Teaspoon -- Pharyngeal -- Pharyngeal- Honey Cup --  Pharyngeal -- Pharyngeal- Nectar Teaspoon -- Pharyngeal -- Pharyngeal- Nectar Cup Delayed swallow initiation-pyriform sinuses;Penetration/Aspiration before swallow;Penetration/Aspiration during swallow Pharyngeal Material enters airway, passes BELOW cords then ejected out;Material does not enter airway Pharyngeal- Nectar Straw Delayed swallow initiation-pyriform sinuses;Penetration/Aspiration during swallow;Penetration/Aspiration before swallow;Trace aspiration Pharyngeal Material enters airway, passes BELOW cords then ejected out;Material does not enter airway Pharyngeal- Thin Teaspoon -- Pharyngeal -- Pharyngeal- Thin Cup Delayed swallow initiation-pyriform sinuses;Penetration/Aspiration during swallow;Penetration/Aspiration before swallow;Trace aspiration Pharyngeal Material enters airway, passes BELOW cords then ejected out;Material does not enter airway Pharyngeal- Thin Straw Delayed swallow initiation-pyriform sinuses;Penetration/Aspiration during swallow;Penetration/Aspiration before swallow;Trace aspiration Pharyngeal Material enters airway, passes BELOW cords then ejected out;Material does not enter airway Pharyngeal- Puree Reduced pharyngeal peristalsis;Pharyngeal residue - valleculae;Pharyngeal residue - pyriform;Lateral channel residue;Compensatory strategies attempted  (with notebox) Pharyngeal -- Pharyngeal- Mechanical Soft Reduced pharyngeal peristalsis;Pharyngeal residue - valleculae;Pharyngeal residue - pyriform;Lateral channel residue Pharyngeal -- Pharyngeal- Regular -- Pharyngeal -- Pharyngeal- Multi-consistency -- Pharyngeal -- Pharyngeal- Pill -- Pharyngeal -- Pharyngeal Comment --  No flowsheet data found. No flowsheet data found. Harlon Ditty, MA CCC-SLP [phone] Dyanne Iha Riley Nearing 06/28/2017, 12:14 PM              Ct Head Code Stroke Wo Contrast  Result Date: 06/25/2017 CLINICAL DATA:  Acute onset of slurred speech and right-sided facial droop. Last seen normal 9 hours ago. EXAM: CT HEAD WITHOUT CONTRAST CT CERVICAL SPINE WITHOUT CONTRAST TECHNIQUE: Multidetector CT imaging of the head and cervical spine was performed following the standard protocol without intravenous contrast. Multiplanar CT image reconstructions of the cervical spine were also generated. COMPARISON:  CT head without contrast 02/06/2016. FINDINGS: CT HEAD FINDINGS Brain: Remote encephalomalacia of the left occipital lobe known bilateral parietal lobes is stable. Remote lacunar infarcts of the basal ganglia are stable bilaterally. Remote lacunar infarct is again seen in the right thalamus. No acute cortical infarct, hemorrhage, or mass lesion is present. The insular ribbon is intact bilaterally. The basal ganglia are stable. The brainstem and cerebellum are within normal limits. Vascular: Dense calcifications are present within the cavernous internal carotid arteries in the left vertebral artery. There is no hyperdense vessel. Skull: Calvarium is intact. No focal lytic or blastic lesions are present. Sinuses/Orbits: Remote left orbital blowout fracture is stable. The paranasal sinuses and mastoid air cells are otherwise within normal limits. Globes and orbits are otherwise normal. CT CERVICAL SPINE FINDINGS Alignment: Grade 1 anterolisthesis at C2-3 is degenerative. AP alignment is  otherwise anatomic. Skull base and vertebrae: The craniocervical junction is normal. Marked degenerative changes are again seen at C1-2. Vertebral body heights are normal. No acute or healing fracture is present. Soft tissues and spinal canal: Vascular calcifications are present bilaterally, most prominent at the right carotid bifurcation. There are calcifications along the left vertebral artery. These stable. Disc levels: Central canal stenosis is most severe at C3-4 with prominent posterior calcifications narrow the canal. Foraminal narrowing is also greatest at C3-4 without significant change. There is left-sided foraminal disease at C5-6 and C6-7 similar the prior study as well. Upper chest: The lung apices are clear. IMPRESSION: 1. No acute intracranial abnormality or significant interval change. 2. Aspects 10/10 3. 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 4. Stable remote lacunar infarcts of the basal ganglia. 5. Extensive vascular calcifications. 6. Stable multilevel spondylosis cervical spine without acute abnormality. 7.  Aortic Atherosclerosis (ICD10-I70.0). These results were called by telephone at the time of interpretation on 06/25/2017 at 6:10 pm to Dr. Jomarie Longs  ZAMMIT , who verbally acknowledged these results. Electronically Signed   By: Marin Roberts M.D.   On: 06/25/2017 18:10      Subjective: He think speech has improved some.   Discharge Exam: Vitals:   06/28/17 0500 06/28/17 1310  BP: 131/68 (!) 105/56  Pulse: (!) 55 66  Resp: (!) 22 17  Temp: 97.9 F (36.6 C) (!) 97.5 F (36.4 C)  SpO2: 96% 93%   Vitals:   06/27/17 2152 06/28/17 0147 06/28/17 0500 06/28/17 1310  BP: (!) 123/56 122/60 131/68 (!) 105/56  Pulse: 62 79 (!) 55 66  Resp: 20 20 (!) 22 17  Temp: 97.7 F (36.5 C) (!) 97.5 F (36.4 C) 97.9 F (36.6 C) (!) 97.5 F (36.4 C)  TempSrc: Oral Oral Oral Axillary  SpO2: 96% 99% 96% 93%  Weight:        General: Pt  is alert, awake, not in acute distress Cardiovascular: RRR, S1/S2 +, no rubs, no gallops Respiratory: CTA bilaterally, no wheezing, no rhonchi Abdominal: Soft, NT, ND, bowel sounds + Extremities: no edema, no cyanosis Neuro; dysarthria.     The results of significant diagnostics from this hospitalization (including imaging, microbiology, ancillary and laboratory) are listed below for reference.     Microbiology: No results found for this or any previous visit (from the past 240 hour(s)).   Labs: BNP (last 3 results) No results for input(s): BNP in the last 8760 hours. Basic Metabolic Panel: Recent Labs  Lab 06/25/17 1749 06/25/17 1802 06/28/17 0811  NA 141 141 138  K 4.5 4.5 4.1  CL 109 106 106  CO2 25  --  26  GLUCOSE 113* 112* 87  BUN 13 12 18   CREATININE 1.08 1.00 1.21  CALCIUM 9.1  --  8.6*   Liver Function Tests: Recent Labs  Lab 06/25/17 1749  AST 19  ALT 11*  ALKPHOS 80  BILITOT 0.8  PROT 6.9  ALBUMIN 4.1   No results for input(s): LIPASE, AMYLASE in the last 168 hours. No results for input(s): AMMONIA in the last 168 hours. CBC: Recent Labs  Lab 06/25/17 1749 06/25/17 1802  WBC 8.5  --   NEUTROABS 6.1  --   HGB 15.6 15.0  HCT 44.9 44.0  MCV 96.8  --   PLT 172  --    Cardiac Enzymes: No results for input(s): CKTOTAL, CKMB, CKMBINDEX, TROPONINI in the last 168 hours. BNP: Invalid input(s): POCBNP CBG: Recent Labs  Lab 06/25/17 1741 06/28/17 1120  GLUCAP 111* 91   D-Dimer No results for input(s): DDIMER in the last 72 hours. Hgb A1c Recent Labs    06/26/17 0500  HGBA1C 4.9   Lipid Profile Recent Labs    06/26/17 0500  CHOL 176  HDL 39*  LDLCALC 117*  TRIG 98  CHOLHDL 4.5   Thyroid function studies No results for input(s): TSH, T4TOTAL, T3FREE, THYROIDAB in the last 72 hours.  Invalid input(s): FREET3 Anemia work up No results for input(s): VITAMINB12, FOLATE, FERRITIN, TIBC, IRON, RETICCTPCT in the last 72  hours. Urinalysis    Component Value Date/Time   COLORURINE YELLOW 06/25/2017 1900   APPEARANCEUR HAZY (A) 06/25/2017 1900   LABSPEC 1.021 06/25/2017 1900   PHURINE 7.0 06/25/2017 1900   GLUCOSEU NEGATIVE 06/25/2017 1900   HGBUR SMALL (A) 06/25/2017 1900   BILIRUBINUR NEGATIVE 06/25/2017 1900   KETONESUR NEGATIVE 06/25/2017 1900   PROTEINUR NEGATIVE 06/25/2017 1900   UROBILINOGEN 1 06/10/2014 1518   NITRITE NEGATIVE 06/25/2017 1900  LEUKOCYTESUR NEGATIVE 06/25/2017 1900   Sepsis Labs Invalid input(s): PROCALCITONIN,  WBC,  LACTICIDVEN Microbiology No results found for this or any previous visit (from the past 240 hour(s)).   Time coordinating discharge: Over 30 minutes  SIGNED:   Alba Cory, MD  [facility] 06/28/2017, 1:46 PM Pager [phone]  If 7PM-7AM, please contact night-coverage [email or web address] Password [email or web address]

## 2017-06-28 NOTE — Progress Notes (Signed)
Physical Medicine and Rehabilitation Admission H&P        Chief Complaint  Patient presents with  . Facial Droop  . Aphasia  : HPI: Charles Hatfield is a 77 y.o.right handed male with history of CVA, left CEA, CAD with CABG, COPD with remote tobacco abuse, chronic systolic congestive heart failure,atrial fibrillation/ intracardiac thrombus noncompliant to Coumadin and hypertension.Per chart review patient lives with son. Uses a straight point cane for ambulation and still driving. One level home. Presented 06/25/2017 with right sided weakness and slurred speech. INR on admission of 1.08.UDS negative. Cranial CT unremarkable for acute process. Per report, stable remote infarct left occipital lobe and bilateral parietal lobes.CT angiogram head and neck showed occlusion of the right common carotid artery at its mid aspect age indeterminate. Extensive atheromatous irregularity throughout the left ICA and MCA without large vessel occlusion.3 mm focal outpouching arising at the confluence of the right P1 segment posterior communicating artery suspicious for small aneurysm. Patient did not receive TPA.MRI the brain reviewed, showing right internal capsule CVA. Per report, 2 cm acute ischemic nonhemorrhagic infarct involving the posterior limb of the right internal capsule.Echocardiogram with ejection fraction of 52% grade 1 diastolic dysfunction.Chronic Coumadin ongoing. Initially maintained on a dysphagia 1 Honey thick liquid diet with swallow study 06/28/2016 and diet advanced to dysphagia #2 nectar thick liquids.Physical and occupational therapy evaluations completed 06/27/2016 with recommendations of physical medicine rehabilitation consult. Patient was admitted for a comprehensive rehabilitation program       ROS  Review of Systems  Constitutional: Negative for chills and fever.  HENT: Negative for hearing loss.   Eyes: Negative for blurred vision and double vision.  Respiratory: Positive for  shortness of breath.   Cardiovascular: Positive for palpitations and leg swelling.  Gastrointestinal: Positive for constipation. Negative for nausea and vomiting.  Genitourinary: Positive for hematuria. Negative for dysuria.  Musculoskeletal: Positive for myalgias.  Skin: Negative for rash.  Neurological: Positive for weakness. Negative for seizures.  All other systems reviewed and are negative             Past Medical History:  Diagnosis Date  . Atrial fibrillation ([place])      perioperative  . CAD (coronary artery disease)    . Carotid stenosis      bilateral  . COPD (chronic obstructive pulmonary disease) ([place])    . Critical lower limb ischemia    . Diabetes mellitus    . DM (diabetes mellitus) ([place]) 01/15/2012  . Dyslipidemia    . H/O ascending aorta repair    . Hematuria 01/15/2012  . Hypertension    . Ischemic cardiomyopathy    . Left ventricular apical thrombus following MI, NSTEMI, 01/13/12 01/15/2012  . LV (left ventricular) mural thrombus      inferapical  . S/P CABG x 3 01/22/2012    LIMA to LAD,SVG to second diagonal,SVG to distal RCA  . Staghorn renal calculus 01/15/2012  . Stroke ([place])    . Thrombocytopenia ([place])           Past Surgical History:  Procedure Laterality Date  . ABDOMINAL ANGIOGRAM   03/03/2013    Procedure: ABDOMINAL ANGIOGRAM;  Surgeon: Lorretta Harp, MD;  Location[facility] CATH LAB;  Service: Cardiovascular;;  . CAROTID ENDARTERECTOMY   01/13/2012  . CORONARY ANGIOGRAM   01/16/2012    Procedure: CORONARY ANGIOGRAM;  Surgeon: Sanda Klein, MD;  Location: [facility] CATH LAB;  Service: Cardiovascular;;  . CORONARY ARTERY BYPASS GRAFT   01/22/2012  Procedure: CORONARY ARTERY BYPASS GRAFTING (CABG);  Surgeon: Ivin Poot, MD;  Location: [facility];  Service: Open Heart Surgery;  Laterality: N/A;  CABG x three using left internal mammary artery and right leg greater saphenous vein harvested endoscopically  . ENDARTERECTOMY   01/22/2012    Procedure: ENDARTERECTOMY  CAROTID;  Surgeon: Rosetta Posner, MD;  Location: [place];  Service: Vascular;  Laterality: Left;  . LOWER EXTREMITY ANGIOGRAM Left 03/03/2013    Procedure: LOWER EXTREMITY ANGIOGRAM;  Surgeon: Lorretta Harp, MD;  Location: [facility] CATH LAB;  Service: Cardiovascular;  Laterality: Left;  . NM MYOVIEW LTD   02/20/2012    infarct/scar w/mild perinfarct ischemia, EF 38%, abnormal nuc, no sign. ischemia  . REPAIR THORACIC AORTA        Ascending thoracic aorto repair         Family History  Problem Relation Age of Onset  . Heart disease Father    . Heart attack Brother      Social History:  reports that he quit smoking about 5 years ago. His smoking use included cigarettes. He smoked 1.00 pack per day. he has never used smokeless tobacco. He reports that he does not drink alcohol or use drugs. Allergies: No Known Allergies       Medications Prior to Admission  Medication Sig Dispense Refill  . acetaminophen (TYLENOL) 500 MG tablet Take 1,000 mg by mouth every 6 (six) hours as needed for pain.       Marland Kitchen aspirin EC 81 MG tablet Take 81 mg by mouth daily.      Marland Kitchen albuterol (PROVENTIL HFA;VENTOLIN HFA) 108 (90 BASE) MCG/ACT inhaler Inhale 2 puffs into the lungs every 6 (six) hours as needed for wheezing. (Patient not taking: Reported on 06/11/2017) 1 Inhaler 6  . atorvastatin (LIPITOR) 20 MG tablet TAKE ONE TABLET BY MOUTH ONCE DAILY (Patient not taking: Reported on 06/11/2017) 30 tablet 0  . ipratropium (ATROVENT HFA) 17 MCG/ACT inhaler Inhale 2 puffs into the lungs every 6 (six) hours as needed for wheezing. (Patient not taking: Reported on 06/11/2017) 1 Inhaler 0  . lisinopril (PRINIVIL,ZESTRIL) 5 MG tablet TAKE ONE TABLET BY MOUTH ONCE DAILY (Patient not taking: Reported on 06/11/2017) 30 tablet 0  . NITROSTAT 0.4 MG SL tablet DISSOLVE ONE TABLET UNDER THE TONGUE EVERY 5 MINUTES AS NEEDED FOR CHEST PAIN.  DO NOT EXCEED A TOTAL OF 3 DOSES IN 15 MINUTES NOW (Patient not taking: Reported on 06/11/2017) 25 tablet  2  . traMADol (ULTRAM) 50 MG tablet TAKE ONE TABLET BY MOUTH EVERY 6 HOURS AS NEEDED FOR MODERATE PAIN (Patient not taking: Reported on 02/06/2016) 60 tablet 0      Drug Regimen Review Drug regimen was reviewed and remains appropriate with no significant issues identified   Home: Home Living Family/patient expects to be discharged to:: Private residence Living Arrangements: Children Available Help at Discharge: Family, Available PRN/intermittently Type of Home: House Home Access: Level entry Home Layout: One level Biochemist, clinical: Standard Home Equipment: Cane - single point Additional Comments: Information obtained from PT eval  Lives With: Son   Functional History: Prior Function Level of Independence: Independent Comments: Reports driving and using SPC for ambulaton PTA as needed. Lives with son.   Functional Status:  Mobility: Bed Mobility Overal bed mobility: Needs Assistance Bed Mobility: Supine to Sit, Sit to Supine Rolling: Min guard Sidelying to sit: Mod assist, HOB elevated Supine to sit: Mod assist, HOB elevated Sit to supine: Mod assist, HOB  elevated General bed mobility comments: Sitting EOB upon PT arrival.  Transfers Overall transfer level: Needs assistance Equipment used: 2 person hand held assist Transfers: Sit to/from Stand Sit to Stand: Min assist, +2 physical assistance General transfer comment: Cues for hand placement and technique. Min assist +2 for sit to stand from EOB x1 and chair x1. Ambulation/Gait Ambulation/Gait assistance: +2 physical assistance Ambulation Distance (Feet): 50 Feet Assistive device: 2 person hand held assist Gait Pattern/deviations: Step-to pattern, Narrow base of support, Decreased stance time - right, Decreased weight shift to right General Gait Details: Cues to widen BoS; increased WB through [place]. Some knee instability noted LLE. Pt veering left and then right about to run into doorway and IV pole in front of him. Visual  deficits bilaterally noted.  Gait velocity: decreased Gait velocity interpretation: <1.8 ft/sec, indicative of risk for recurrent falls   ADL: ADL Overall ADL's : Needs assistance/impaired Grooming: Moderate assistance, Standing, Wash/dry hands Grooming Details (indicate cue type and reason): Assist for balance and for identifying ADL items for task Upper Body Bathing: Moderate assistance, Sitting Lower Body Bathing: Maximal assistance, Sit to/from stand Upper Body Dressing : Minimal assistance, Sitting Upper Body Dressing Details (indicate cue type and reason): to doff/don new gown Lower Body Dressing: Maximal assistance, Sit to/from stand Toilet Transfer: Moderate assistance, +2 for physical assistance, +2 for safety/equipment, Ambulation(HHA) Toilet Transfer Details (indicate cue type and reason): Simulated by functional mobility Functional mobility during ADLs: Moderate assistance, +2 for physical assistance, +2 for safety/equipment(HHA)   Cognition: Cognition Overall Cognitive Status: Difficult to assess Arousal/Alertness: Awake/alert Orientation Level: Oriented to person, Oriented to place, Disoriented to time, Oriented to situation Attention: Focused, Sustained Focused Attention: Appears intact Sustained Attention: Appears intact Memory: Impaired Memory Impairment: Decreased short term memory Decreased Short Term Memory: Verbal basic Problem Solving: Impaired Problem Solving Impairment: Verbal basic Cognition Arousal/Alertness: Awake/alert Behavior During Therapy: WFL for tasks assessed/performed Overall Cognitive Status: Difficult to assess General Comments: Pt following one step commands consistenlty. Difficult to understand for formal cognitive testing. A&Ox4 but does not know date. Jokes appropriately.  Difficult to assess due to: Impaired communication   Physical Exam: Blood pressure (!) 105/56, pulse 66, temperature (!) 97.5 F (36.4 C), temperature source  Axillary, resp. rate 17, weight 57.1 kg (125 lb 14.1 oz), SpO2 93 %. Physical Exam Constitutional:  Frail white male.  HENT:  Head: Normocephalic.  Eyes:  Pupils sluggish but reactive to light  Neck: Normal range of motion. Neck supple. No thyromegaly present.  Cardiovascular:  Cardiac rate controlled  Respiratory:  Limited inspiratory effort but clear to auscultation  GI: Soft. Bowel sounds are normal. He exhibits no distension.  Skin. Warm and dry Musculoskeletal. No edema or tenderness Neurological:  Severe dysarthria.  Expressive asphasia >> Receptive aphasia Follows simple commands at times. Left inattention Nonverbal Motor, limited by participation: RLE: 2+/5 HF, KE, 4/5 ADF/PF LLE: HF, KE, ADF/PF 4-/5, ADF/PF 4+/5 Bl UE: ?4+/5 proximal to distal   Sensation     Lab Results Last 48 Hours        Results for orders placed or performed during the hospital encounter of 06/25/17 (from the past 48 hour(s))  Protime-INR     Status: Abnormal    Collection Time: 06/27/17  5:32 AM  Result Value Ref Range    Prothrombin Time 16.7 (H) 11.4 - 15.2 seconds    INR 1.37    Protime-INR     Status: Abnormal    Collection Time: 06/28/17  8:11  AM  Result Value Ref Range    Prothrombin Time 21.4 (H) 11.4 - 15.2 seconds    INR 1.61    Basic metabolic panel     Status: Abnormal    Collection Time: 06/28/17  8:11 AM  Result Value Ref Range    Sodium 138 135 - 145 mmol/L    Potassium 4.1 3.5 - 5.1 mmol/L    Chloride 106 101 - 111 mmol/L    CO2 26 22 - 32 mmol/L    Glucose, Bld 87 65 - 99 mg/dL    BUN 18 6 - 20 mg/dL    Creatinine, Ser 1.21 0.61 - 1.24 mg/dL    Calcium 8.6 (L) 8.9 - 10.3 mg/dL    GFR calc non Af Amer 56 (L) >60 mL/min    GFR calc Af Amer >60 >60 mL/min      Comment: (NOTE) The eGFR has been calculated using the CKD EPI equation. This calculation has not been validated in all clinical situations. eGFR's persistently <60 mL/min signify possible Chronic  Kidney Disease.      Anion gap 6 5 - 15  Glucose, capillary     Status: None    Collection Time: 06/28/17 11:20 AM  Result Value Ref Range    Glucose-Capillary 91 65 - 99 mg/dL    Comment 1 Notify RN      Comment 2 Document in Chart         Imaging Results (Last 48 hours)  Mr Brain Wo Contrast   Result Date: 06/27/2017 CLINICAL DATA:  Initial evaluation for acute slurred speech with right facial droop, TIA. EXAM: MRI HEAD WITHOUT CONTRAST TECHNIQUE: Multiplanar, multiecho pulse sequences of the brain and surrounding structures were obtained without intravenous contrast. COMPARISON:  Prior CT and CTA from 06/25/2017. FINDINGS: Brain: Moderately advanced cerebral atrophy. Patchy and confluent T2/FLAIR hyperintensity throughout the periventricular and deep white matter both cerebral hemispheres, consistent with chronic microvascular ischemic changes. Multiple remote lacunar infarcts seen involving the bilateral basal ganglia and right thalamus. Additional remote small vessel infarct present at the left paramedian pons. Remote bilateral PCA territory infarcts. Associated laminar necrosis noted about the chronic right PCA infarct. Chronic hemosiderin staining seen about several of these infarcts, consistent with chronic blood products. Bifrontal encephalomalacia suspected to be related to remote trauma, although sequelae of prior ischemia could also be considered. 2 cm focus of restricted diffusion seen involving the posterior limb of the right internal capsule, consistent with acute ischemic infarct (series 4, image 25). No associated hemorrhage or mass effect. No other evidence for acute or subacute ischemia. No mass lesion, midline shift, or mass effect. No hydrocephalus. No extra-axial fluid collection. Major dural sinuses are grossly patent. Pituitary gland suprasellar region normal. Midline structures intact. Vascular: Abnormal flow void within the right ICA to the level of the terminus, consistent  with previous identified occlusion. Hypoplastic right vertebral artery poorly visualized. Major intracranial vascular flow voids otherwise maintained. Skull and upper cervical spine: Craniocervical junction normal. Upper cervical spine grossly within normal limits. Bone marrow signal intensity somewhat mottled in appearance without discrete osseous lesion. No scalp soft tissue abnormality. Sinuses/Orbits: Globes and orbital soft tissues within normal limits. Remote defect noted at the left lamina for pre shaft. Paranasal sinuses are largely clear. Trace bilateral mastoid effusions, of doubtful significance. Inner ear structures normal. Other: None. IMPRESSION: 1. 2 cm acute ischemic nonhemorrhagic infarct involving the posterior limb of the right internal capsule. 2. Multiple remote infarcts involving the bilateral basal ganglia,  right thalamus, and left paramedian pons, with additional remote bilateral PCA territory infarcts. Additional areas of bifrontal encephalomalacia favored to be posttraumatic in nature given location and morphology. 3. 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 4. Moderate cerebral atrophy with chronic microvascular ischemic disease. Electronically Signed   By: Jeannine Boga M.D.   On: 06/27/2017 00:32    Dg Chest Port 1 View   Result Date: 06/27/2017 CLINICAL DATA:  Shortness of breath.  Dyspnea. EXAM: PORTABLE CHEST 1 VIEW COMPARISON:  06/26/2017 and 06/11/2017 FINDINGS: The heart size and mediastinal contours are within normal limits. Both lungs are clear. The visualized skeletal structures are unremarkable. CABG. IMPRESSION: No active disease. Electronically Signed   By: Lorriane Shire M.D.   On: 06/27/2017 11:50    Dg Swallowing Func-speech Pathology   Result Date: 06/28/2017 Objective Swallowing Evaluation: Type of Study: MBS-Modified Barium Swallow Study  Patient Details Name: Charles Hatfield MRN: [id number] Date of Birth: 06-11-1941 Today's  Date: 06/28/2017 Time: SLP Start Time (ACUTE ONLY): 0945 -SLP Stop Time (ACUTE ONLY): 1010 SLP Time Calculation (min) (ACUTE ONLY): 25 min Past Medical History: Past Medical History: Diagnosis Date . Atrial fibrillation ([place])   perioperative . CAD (coronary artery disease)  . Carotid stenosis   bilateral . COPD (chronic obstructive pulmonary disease) ([place])  . Critical lower limb ischemia  . Diabetes mellitus  . DM (diabetes mellitus) ([place]) 01/15/2012 . Dyslipidemia  . H/O ascending aorta repair  . Hematuria 01/15/2012 . Hypertension  . Ischemic cardiomyopathy  . Left ventricular apical thrombus following MI, NSTEMI, 01/13/12 01/15/2012 . LV (left ventricular) mural thrombus   inferapical . S/P CABG x 3 01/22/2012  LIMA to LAD,SVG to second diagonal,SVG to distal RCA . Staghorn renal calculus 01/15/2012 . Stroke ([place])  . Thrombocytopenia ([place])  Past Surgical History: Past Surgical History: Procedure Laterality Date . ABDOMINAL ANGIOGRAM  03/03/2013  Procedure: ABDOMINAL ANGIOGRAM;  Surgeon: Lorretta Harp, MD;  Location: [facility] CATH LAB;  Service: Cardiovascular;; . CAROTID ENDARTERECTOMY  01/13/2012 . CORONARY ANGIOGRAM  01/16/2012  Procedure: CORONARY ANGIOGRAM;  Surgeon: Sanda Klein, MD;  Location: [facility] CATH LAB;  Service: Cardiovascular;; . CORONARY ARTERY BYPASS GRAFT  01/22/2012  Procedure: CORONARY ARTERY BYPASS GRAFTING (CABG);  Surgeon: Ivin Poot, MD;  Location: [place];  Service: Open Heart Surgery;  Laterality: N/A;  CABG x three using left internal mammary artery and right leg greater saphenous vein harvested endoscopically . ENDARTERECTOMY  01/22/2012  Procedure: ENDARTERECTOMY CAROTID;  Surgeon: Rosetta Posner, MD;  Location: [place];  Service: Vascular;  Laterality: Left; . LOWER EXTREMITY ANGIOGRAM Left 03/03/2013  Procedure: LOWER EXTREMITY ANGIOGRAM;  Surgeon: Lorretta Harp, MD;  Location: [facility] CATH LAB;  Service: Cardiovascular;  Laterality: Left; . NM MYOVIEW LTD  02/20/2012  infarct/scar w/mild perinfarct  ischemia, EF 38%, abnormal nuc, no sign. ischemia . REPAIR THORACIC AORTA    Ascending thoracic aorto repair HPI: Patient is a 77 y.o. male with PMH: severe PAD, prior CVA, CAD s/p CABG, HTN, L CEA who presented to the ED with son, who reported patient with new onset slurred speech and right facial droop. Head CT did not reveal any acute abnormality, with stable remote infarcts involving left occipital lobe, bilateral parietal lobes, lacunar infarcts of basal ganglia. Per MD note pt is living in a motel with his son, does not have adequate resources for pt to take medication regularly.  Subjective: extremely dysarthric and very difficult to understand Assessment /  Plan / Recommendation CHL IP CLINICAL IMPRESSIONS 06/28/2017 Clinical Impression Pt demonstrates a pirmary oral dysphagia that impacts pharyngeal function. Pt has significantly right labial and buccal weakness with decreased ability to seal lips and produce adequate intraoral pressures. There is occasional anterior spillage and pooling in the right anterior/buccal cavity. There is piecemeal oral propulsion of bolus with delayed swallow initiation. Pt requries moderate sized bolus to initaite effective swallow. Thin liquids pooling the pharynx prior to swallow are aspirated with hard sensation and ejected. Nectar is tolerated as long as bolus is not overlarge. Pt was able to take moderate cup and straw sips with verbal cues, but is aspiration does occur it is immediately ejected with hard cough. Mild pharyngeal wall weakness, likely on the right side, results in unilateral mild residuals with solids. Will Recommend a dys 2 (fine chopped diet) with nectar thick liquids with interventions to target pharyngeal strength, and oral compensatory strategies.  SLP Visit Diagnosis Dysphagia, oropharyngeal phase (R13.12);Dysphagia, oral phase (R13.11) Attention and concentration deficit following -- Frontal lobe and executive function deficit following -- Impact on  safety and function Moderate aspiration risk   CHL IP TREATMENT RECOMMENDATION 06/28/2017 Treatment Recommendations Therapy as outlined in treatment plan below   Prognosis 06/28/2017 Prognosis for Safe Diet Advancement Good Barriers to Reach Goals -- Barriers/Prognosis Comment -- CHL IP DIET RECOMMENDATION 06/28/2017 SLP Diet Recommendations Dysphagia 2 (Fine chop) solids;Nectar thick liquid Liquid Administration via Cup;Straw Medication Administration -- Compensations Slow rate;Small sips/bites;Multiple dry swallows after each bite/sip;Monitor for anterior loss Postural Changes Seated upright at 90 degrees   CHL IP OTHER RECOMMENDATIONS 06/28/2017 Recommended Consults -- Oral Care Recommendations -- Other Recommendations Order thickener from pharmacy;Clarify dietary restrictions;Prohibited food (jello, ice cream, thin soups)   CHL IP FOLLOW UP RECOMMENDATIONS 06/28/2017 Follow up Recommendations Inpatient Rehab   CHL IP FREQUENCY AND DURATION 06/28/2017 Speech Therapy Frequency (ACUTE ONLY) min 2x/week Treatment Duration 2 weeks      CHL IP ORAL PHASE 06/28/2017 Oral Phase Impaired Oral - Pudding Teaspoon -- Oral - Pudding Cup -- Oral - Honey Teaspoon -- Oral - Honey Cup -- Oral - Nectar Teaspoon -- Oral - Nectar Cup Pocketing in anterior sulcus;Right pocketing in lateral sulci;Right anterior bolus loss;Decreased bolus cohesion;Premature spillage Oral - Nectar Straw Pocketing in anterior sulcus;Right pocketing in lateral sulci;Right anterior bolus loss;Decreased bolus cohesion;Premature spillage Oral - Thin Teaspoon -- Oral - Thin Cup Pocketing in anterior sulcus;Right pocketing in lateral sulci;Right anterior bolus loss;Decreased bolus cohesion;Premature spillage Oral - Thin Straw Pocketing in anterior sulcus;Right pocketing in lateral sulci;Right anterior bolus loss;Decreased bolus cohesion;Premature spillage Oral - Puree Pocketing in anterior sulcus;Right pocketing in lateral sulci;Right anterior bolus loss;Decreased bolus  cohesion;Premature spillage Oral - Mech Soft Pocketing in anterior sulcus;Right pocketing in lateral sulci;Right anterior bolus loss;Decreased bolus cohesion;Premature spillage Oral - Regular -- Oral - Multi-Consistency -- Oral - Pill -- Oral Phase - Comment --  CHL IP PHARYNGEAL PHASE 06/28/2017 Pharyngeal Phase Impaired Pharyngeal- Pudding Teaspoon -- Pharyngeal -- Pharyngeal- Pudding Cup -- Pharyngeal -- Pharyngeal- Honey Teaspoon -- Pharyngeal -- Pharyngeal- Honey Cup -- Pharyngeal -- Pharyngeal- Nectar Teaspoon -- Pharyngeal -- Pharyngeal- Nectar Cup Delayed swallow initiation-pyriform sinuses;Penetration/Aspiration before swallow;Penetration/Aspiration during swallow Pharyngeal Material enters airway, passes BELOW cords then ejected out;Material does not enter airway Pharyngeal- Nectar Straw Delayed swallow initiation-pyriform sinuses;Penetration/Aspiration during swallow;Penetration/Aspiration before swallow;Trace aspiration Pharyngeal Material enters airway, passes BELOW cords then ejected out;Material does not enter airway Pharyngeal- Thin Teaspoon -- Pharyngeal -- Pharyngeal- Thin Cup Delayed swallow initiation-pyriform sinuses;Penetration/Aspiration during  swallow;Penetration/Aspiration before swallow;Trace aspiration Pharyngeal Material enters airway, passes BELOW cords then ejected out;Material does not enter airway Pharyngeal- Thin Straw Delayed swallow initiation-pyriform sinuses;Penetration/Aspiration during swallow;Penetration/Aspiration before swallow;Trace aspiration Pharyngeal Material enters airway, passes BELOW cords then ejected out;Material does not enter airway Pharyngeal- Puree Reduced pharyngeal peristalsis;Pharyngeal residue - valleculae;Pharyngeal residue - pyriform;Lateral channel residue;Compensatory strategies attempted (with notebox) Pharyngeal -- Pharyngeal- Mechanical Soft Reduced pharyngeal peristalsis;Pharyngeal residue - valleculae;Pharyngeal residue - pyriform;Lateral channel  residue Pharyngeal -- Pharyngeal- Regular -- Pharyngeal -- Pharyngeal- Multi-consistency -- Pharyngeal -- Pharyngeal- Pill -- Pharyngeal -- Pharyngeal Comment --  No flowsheet data found. No flowsheet data found. Herbie Baltimore, [place] [phone] Lynann Beaver 06/28/2017, 12:14 PM                        Medical Problem List and Plan: 1.  Right side weakness secondary to previous left pontine infarct with dysarthria secondary to acute ischemic nonhemorrhagic infarct involving the posterior limb right internal capsule 2.  DVT Prophylaxis/Anticoagulation: Chronic Coumadin. Monitor for any bleeding episodes 3. Pain Management: Tylenol as needed 4. Mood: Provide emotional support 5. Neuropsych: This patient is capable of making decisions on his own behalf. 6. Skin/Wound Care: Routine skin checks 7. Fluids/Electrolytes/Nutrition: Routine I&O's with follow-up chemistries 8. Dysphagia. Dysphagia #2 nectar liquids. Monitor hydration with follow-up speech therapy 9. Atrial fibrillation. Cardiac rate controlled. Continue Coumadin 10. CAD/left ventricular apical thrombus/NSTEMI with CABG 2013. No chest pain noted. Continue aspirin 11. Hypertension. Monitor with increased mobility 12. COPD with remote tobacco abuse. Continue albuterol treatments as needed 13. Chronic systolic congestive heart failure. Monitor for any signs of fluid overload 14. Hyperlipidemia. Lipitor  15.  Vascular dementia secondary to multi-infarcts small vessel disease Post Admission Physician Evaluation: 1. Functional deficits secondary  to new right posterior limb internal capsule infarct with previous left pontine infarcts and scattered cortical and subcortical white matter infarcts bilaterally. 2. Patient is admitted to receive collaborative, interdisciplinary care between the physiatrist, rehab nursing staff, and therapy team. 3. Patient's level of medical complexity and substantial therapy needs in context of that  medical necessity cannot be provided at a lesser intensity of care such as a SNF. 4. Patient has experienced substantial functional loss from his/her baseline which was documented above under the "Functional History" and "Functional Status" headings.  Judging by the patient's diagnosis, physical exam, and functional history, the patient has potential for functional progress which will result in measurable gains while on inpatient rehab.  These gains will be of substantial and practical use upon discharge  in facilitating mobility and self-care at the household level. 5. Physiatrist will provide 24 hour management of medical needs as well as oversight of the therapy plan/treatment and provide guidance as appropriate regarding the interaction of the two. 6. The Preadmission Screening has been reviewed and patient status is unchanged unless otherwise stated above. 7. 24 hour rehab nursing will assist with bladder management, bowel management, safety, skin/wound care, disease management, medication administration, pain management and patient education  and help integrate therapy concepts, techniques,education, etc. 8. PT will assess and treat for/with: pre gait, gait training, endurance , safety, equipment, neuromuscular re education.   Goals are: Min/Sup. 9. OT will assess and treat for/with: ADLs, Cognitive perceptual skills, Neuromuscular re education, safety, endurance, equipment.   Goals are: Min/Sup. Therapy may proceed with showering this patient. 10. SLP will assess and treat for/with: Evaluate cognition and swallowing.  Goals are: Safe and adequate p.o. intake with normal diet,  advised on memory strategies. 11. Case Management and Social Worker will assess and treat for psychological issues and discharge planning. 12. Team conference will be held weekly to assess progress toward goals and to determine barriers to discharge. 13. Patient will receive at least 3 hours of therapy per day at least 5 days  per week. 14. ELOS: 14-17d       15. Prognosis:  fair         Charlett Blake M.D. [facility] FAAPM&R (Sports Med, Neuromuscular Med) Diplomate Am Board of Electrodiagnostic Med  Cathlyn Parsons, PA-C 06/28/2017

## 2017-06-28 NOTE — Progress Notes (Signed)
Inpatient Rehabilitation  Attempted to meet with patient at bedside to discuss team's recommendaiton for IP Rehab; however, given dysarthria it was difficult to understand patient.  He kept referring to his son and seemed worried that something happened and he had not been to the hospital yet today.  I called son, Casimiro Needle and left a voicemail requesting he call me back today.  Plan to follow up with team.  Call if questions.    Charlane Ferretti., CCC/SLP Admission Coordinator  [facility]  Cell [phone]

## 2017-06-28 NOTE — Progress Notes (Signed)
Modified Barium Swallow Progress Note  Patient Details  Name: Charles Hatfield MRN: [id number] Date of Birth: 07-02-1940  Today's Date: 06/28/2017  Modified Barium Swallow completed.  Full report located under Chart Review in the Imaging Section.  Brief recommendations include the following:  Clinical Impression  Pt demonstrates a pirmary oral dysphagia that impacts pharyngeal function. Pt has significantly right labial and buccal weakness with decreased ability to seal lips and produce adequate intraoral pressures. There is occasional anterior spillage and pooling in the right anterior/buccal cavity. There is piecemeal oral propulsion of bolus with delayed swallow initiation. Pt requries moderate sized bolus to initaite effective swallow. Thin liquids pooling the pharynx prior to swallow are aspirated with hard sensation and ejected. Nectar is tolerated as long as bolus is not overlarge. Pt was able to take moderate cup and straw sips with verbal cues, but is aspiration does occur it is immediately ejected with hard cough. Mild pharyngeal wall weakness, likely on the right side, results in unilateral mild residuals with solids. Will Recommend a dys 2 (fine chopped diet) with nectar thick liquids with interventions to target pharyngeal strength, and oral compensatory strategies.    Swallow Evaluation Recommendations       SLP Diet Recommendations: Dysphagia 2 (Fine chop) solids;Nectar thick liquid   Liquid Administration via: Cup;Straw       Supervision: Staff to assist with self feeding;Comment(visual deficits)   Compensations: Slow rate;Small sips/bites;Multiple dry swallows after each bite/sip;Monitor for anterior loss   Postural Changes: Seated upright at 90 degrees       Other Recommendations: Order thickener from pharmacy;Clarify dietary restrictions;Prohibited food (jello, ice cream, thin soups)    Charles Hatfield, Riley Nearing 06/28/2017,12:14 PM

## 2017-06-28 NOTE — Progress Notes (Signed)
ANTICOAGULATION CONSULT NOTE  Pharmacy Consult for warfarin Indication: atrial fibrillation  No Known Allergies  Patient Measurements: Weight: 125 lb 14.1 oz (57.1 kg)   Vital Signs: Temp: 97.9 F (36.6 C) (01/03 0500) Temp Source: Oral (01/03 0500) BP: 131/68 (01/03 0500) Pulse Rate: 55 (01/03 0500)  Labs: Recent Labs    06/25/17 1749 06/25/17 1802 06/27/17 0532 06/28/17 0811  HGB 15.6 15.0  --   --   HCT 44.9 44.0  --   --   PLT 172  --   --   --   APTT 28  --   --   --   LABPROT 13.9  --  16.7* 21.4*  INR 1.08  --  1.37 1.87  CREATININE 1.08 1.00  --  1.21    CrCl cannot be calculated (Unknown ideal weight.).   Medical History: Past Medical History:  Diagnosis Date  . Atrial fibrillation (HCC)    perioperative  . CAD (coronary artery disease)   . Carotid stenosis    bilateral  . COPD (chronic obstructive pulmonary disease) (HCC)   . Critical lower limb ischemia   . Diabetes mellitus   . DM (diabetes mellitus) (HCC) 01/15/2012  . Dyslipidemia   . H/O ascending aorta repair   . Hematuria 01/15/2012  . Hypertension   . Ischemic cardiomyopathy   . Left ventricular apical thrombus following MI, NSTEMI, 01/13/12 01/15/2012  . LV (left ventricular) mural thrombus    inferapical  . S/P CABG x 3 01/22/2012   LIMA to LAD,SVG to second diagonal,SVG to distal RCA  . Staghorn renal calculus 01/15/2012  . Stroke (HCC)   . Thrombocytopenia (HCC)     Medications:  Medications Prior to Admission  Medication Sig Dispense Refill Last Dose  . acetaminophen (TYLENOL) 500 MG tablet Take 1,000 mg by mouth every 6 (six) hours as needed for pain.    06/24/2017 at Unknown time  . aspirin EC 81 MG tablet Take 81 mg by mouth daily.   Past Week at Unknown time  . albuterol (PROVENTIL HFA;VENTOLIN HFA) 108 (90 BASE) MCG/ACT inhaler Inhale 2 puffs into the lungs every 6 (six) hours as needed for wheezing. (Patient not taking: Reported on 06/11/2017) 1 Inhaler 6 Not Taking at  Unknown time  . atorvastatin (LIPITOR) 20 MG tablet TAKE ONE TABLET BY MOUTH ONCE DAILY (Patient not taking: Reported on 06/11/2017) 30 tablet 0 Not Taking at Unknown time  . ipratropium (ATROVENT HFA) 17 MCG/ACT inhaler Inhale 2 puffs into the lungs every 6 (six) hours as needed for wheezing. (Patient not taking: Reported on 06/11/2017) 1 Inhaler 0 Not Taking at Unknown time  . lisinopril (PRINIVIL,ZESTRIL) 5 MG tablet TAKE ONE TABLET BY MOUTH ONCE DAILY (Patient not taking: Reported on 06/11/2017) 30 tablet 0 Not Taking at Unknown time  . NITROSTAT 0.4 MG SL tablet DISSOLVE ONE TABLET UNDER THE TONGUE EVERY 5 MINUTES AS NEEDED FOR CHEST PAIN.  DO NOT EXCEED A TOTAL OF 3 DOSES IN 15 MINUTES NOW (Patient not taking: Reported on 06/11/2017) 25 tablet 2 Not Taking at Unknown time  . traMADol (ULTRAM) 50 MG tablet TAKE ONE TABLET BY MOUTH EVERY 6 HOURS AS NEEDED FOR MODERATE PAIN (Patient not taking: Reported on 02/06/2016) 60 tablet 0 Not Taking at Unknown time  . warfarin (COUMADIN) 5 MG tablet TAKE ONE TABLET BY MOUTH ONCE DAILY OR AS  DIRECTED  BY  COUMADIN  CLINIC (Patient not taking: Reported on 06/11/2017) 30 tablet 2 Not Taking at Unknown time  Scheduled:  . aspirin EC  81 mg Oral Daily  . atorvastatin  80 mg Oral q1800  . Warfarin - Pharmacist Dosing Inpatient   Does not apply q1800    Assessment: 77 yoM with PMH Afib, severe PAD, prior CVA, CAD sp CABG, L CEA, HTN, prior aortobifemoral bypass, admitted for stroke Sx but not a TPA candidate. Pt upposed to be taking warfarin for AFib but does not appear to be PTA, and INR ~1.0. Txfer to [facility] for neuro workup; Neuro OK to have pharmacy resume warfarin while admitted, no bridging needed. No bleed documented.  Baseline INR 1.08 - up to 1.87 this AM. CBC wnl on 12/31 Prior anticoagulation: warfarin 5 mg daily, LD unknown (not recent)  Goal of Therapy: INR 2-3  Plan: Warfarin 2.5 mg PO x 1 Daily INR Monitor cbc, for signs of  bleeding  Babs Bertin, PharmD, BCPS Clinical Pharmacist Clinical phone for 06/28/2017 until 3:30pm: [phone] If after 3:30pm, please call main pharmacy at: [phone] 06/28/2017 9:54 AM

## 2017-06-28 NOTE — Progress Notes (Signed)
Physical Therapy Treatment Patient Details Name: Charles Hatfield MRN: [id number] DOB: 12-24-1940 Today's Date: 06/28/2017    History of Present Illness Patient is a 77 y/o male who presents with right facial droop and dysarthria. NIH 7. Unwitnessed fall at home and 3 week history of right sided visual deficits. MRI- acute infarct posterior limb of the internal capsule. CTA- occlusion right common carotid- maybe chronic. PMH includes CVA, PAD s/p aortobifemoral bypass and left CEA, intracardiac thrombus nonadherent to coumadin, CABG, HTN, DM, COPD, A-fib, CAD.     PT Comments    Patient progressing well towards PT goals. Tolerated gait training today with Mod A of 2 for balance/safety. Pt continues to demonstrate visual deficits bilaterally; exhibits no peripheral vision on left side. Difficulty naming objects today- not sure if this is apraxia vs visual impairments. Pt running into door on left side and not able to see IV pole directly in front of him during gait training, also with difficulty performing ADLs at sink. Still with dysarthria so speech unintelligible at times. Good rehab candidate. Will continue to follow.   Follow Up Recommendations  CIR     Equipment Recommendations  None recommended by PT    Recommendations for Other Services Rehab consult     Precautions / Restrictions Precautions Precautions: Fall Restrictions Weight Bearing Restrictions: No    Mobility  Bed Mobility               General bed mobility comments: Sitting EOB upon PT arrival.   Transfers Overall transfer level: Needs assistance Equipment used: 2 person hand held assist Transfers: Sit to/from Stand Sit to Stand: Min assist;+2 physical assistance         General transfer comment: Cues for hand placement and technique. Min assist +2 for sit to stand from EOB x1 and chair x1.  Ambulation/Gait Ambulation/Gait assistance: +2 physical assistance Ambulation Distance (Feet): 50  Feet Assistive device: 2 person hand held assist Gait Pattern/deviations: Step-to pattern;Narrow base of support;Decreased stance time - right;Decreased weight shift to right Gait velocity: decreased   General Gait Details: Cues to widen BoS; increased WB through BUEs. Some knee instability noted LLE. Pt veering left and then right about to run into doorway and IV pole in front of him. Visual deficits bilaterally noted.    Stairs            Wheelchair Mobility    Modified Rankin (Stroke Patients Only) Modified Rankin (Stroke Patients Only) Pre-Morbid Rankin Score: Slight disability Modified Rankin: Moderately severe disability     Balance Overall balance assessment: Needs assistance Sitting-balance support: Feet supported;No upper extremity supported Sitting balance-Leahy Scale: Fair Sitting balance - Comments: Min guard throughout. Able to scoot self to EOB without LOB.   Standing balance support: No upper extremity supported;During functional activity Standing balance-Leahy Scale: Poor Standing balance comment: Min assist for static standing without UE support. Able to stand at sink and wash hands with cues due to visual deficits. Difficulty turning on faucet and finding paper towels without cues.                             Cognition Arousal/Alertness: Awake/alert Behavior During Therapy: WFL for tasks assessed/performed Overall Cognitive Status: Difficult to assess                                 General Comments: Pt following one step  commands consistenlty. Difficult to understand for formal cognitive testing. A&Ox4 but does not know date. Jokes appropriately.       Exercises      General Comments General comments (skin integrity, edema, etc.): Pt with visual deficits noted. Difficult to accurately assess. Able to state correct number of fingers with contrasted background in left field but difficulty in right visual field; no peripheral  vision noted left eye. Right gaze preference. Seems to have some swiss cheese like vision on right side.       Pertinent Vitals/Pain Pain Assessment: No/denies pain    Home Living                      Prior Function            PT Goals (current goals can now be found in the care plan section) Acute Rehab PT Goals Patient Stated Goal: to return to independence Progress towards PT goals: Progressing toward goals    Frequency    Min 4X/week      PT Plan Current plan remains appropriate    Co-evaluation PT/OT/SLP Co-Evaluation/Treatment: Yes Reason for Co-Treatment: For patient/therapist safety;To address functional/ADL transfers;Complexity of the patient's impairments (multi-system involvement);Necessary to address cognition/behavior during functional activity PT goals addressed during session: Mobility/safety with mobility;Balance;Strengthening/ROM OT goals addressed during session: ADL's and self-care      AM-PAC PT "6 Clicks" Daily Activity  Outcome Measure  Difficulty turning over in bed (including adjusting bedclothes, sheets and blankets)?: None Difficulty moving from lying on back to sitting on the side of the bed? : None Difficulty sitting down on and standing up from a chair with arms (e.g., wheelchair, bedside commode, etc,.)?: Unable Help needed moving to and from a bed to chair (including a wheelchair)?: A Little Help needed walking in hospital room?: A Lot Help needed climbing 3-5 steps with a railing? : A Lot 6 Click Score: 16    End of Session Equipment Utilized During Treatment: Gait belt Activity Tolerance: Patient tolerated treatment well Patient left: in chair;with call bell/phone within reach;with chair alarm set;with nursing/sitter in room Nurse Communication: Mobility status PT Visit Diagnosis: Difficulty in walking, not elsewhere classified (R26.2);Other symptoms and signs involving the nervous system (R29.898);History of falling  (Z91.81)     Time: 3729-0211 PT Time Calculation (min) (ACUTE ONLY): 27 min  Charges:  $Gait Training: 8-22 mins                    G Codes:       Mylo Red, PT, DPT [phone]     Blake Divine A Hazely Sealey 06/28/2017, 2:22 PM

## 2017-06-28 NOTE — Progress Notes (Signed)
NEUROHOSPITALISTS STROKE TEAM - DAILY PROGRESS NOTE   SUBJECTIVE (INTERVAL HISTORY) No family is at the bedside. Patient is found laying in bed in NAD. Overall he feels his condition is slowly improving. Voices no new complaints. No new events reported overnight.  Recently returned from modified barium swallow, passed with dysphagia 2 diet. Work with PT OT in the hallway, able to walk slowly with minimal assistance.     OBJECTIVE Lab Results: CBC:  Recent Labs  Lab 06/25/17 1749 06/25/17 1802  WBC 8.5  --   HGB 15.6 15.0  HCT 44.9 44.0  MCV 96.8  --   PLT 172  --    BMP: Recent Labs  Lab 06/25/17 1749 06/25/17 1802 06/28/17 0811  NA 141 141 138  K 4.5 4.5 4.1  CL 109 106 106  CO2 25  --  26  GLUCOSE 113* 112* 87  BUN 13 12 18   CREATININE 1.08 1.00 1.21  CALCIUM 9.1  --  8.6*   Liver Function Tests:  Recent Labs  Lab 06/25/17 1749  AST 19  ALT 11*  ALKPHOS 80  BILITOT 0.8  PROT 6.9  ALBUMIN 4.1   Coagulation Studies:  Recent Labs    06/25/17 1749 06/27/17 0532 06/28/17 0811  APTT 28  --   --   INR 1.08 1.37 1.87   PHYSICAL EXAM Temp:  [97.5 F (36.4 C)-98.2 F (36.8 C)] 97.9 F (36.6 C) (01/03 0500) Pulse Rate:  [55-79] 55 (01/03 0500) Resp:  [20-22] 22 (01/03 0500) BP: (80-131)/(44-68) 131/68 (01/03 0500) SpO2:  [96 %-99 %] 96 % (01/03 0500) General - Well nourished, well developed, in no apparent distress HEENT-  Normocephalic, Normal external eye/conjunctiva.  Normal external ears. Normal external nose, mucus membranes and septum.   Cardiovascular - Regular rate and rhythm  Respiratory - Lungs clear bilaterally. No wheezing. Abdomen - soft and non-tender, BS normal Extremities- no edema or cyanosis Neurologic Examination: Mental Status: Alert, oriented to day, month, year, city and state. Naming intact. In the context of severe dysarthria, speech is fluent.  Able to follow all commands  without difficulty. Cranial Nerves: II:  Right lower quadrant visual field cut. PERRL. III,IV, VI: Eyes conjugate. Saccadic visual pursuits are full horizontally. Mild impairment of upgaze bilaterally.  No nystagmus.  V,VII: Right facial droop. Facial temp sensation equal bilaterally. VIII: hearing intact to voice IX,X: Pharyngeal sever dysarthria XI: No definite asymmetry XII: Lingual dysarthria. +drooling  Motor: RUE: 4/5, RLE 4/5 LUE: 4/5, LLE: 3+/5 Sensory: FT grossly intact x 4  Deep Tendon Reflexes:  Right biceps/brachioradialis 2+ Left biceps/brachioradialis 1+ Right patella 2+, left patella 0. 0 achilles bilaterally. Plantars: Upgoing bilaterally Cerebellar: No ataxia with FNF bilaterally Gait: Deferred.   IMAGING: I have personally reviewed the radiological images below and agree with the radiology interpretations.  Ct Angio Head and Neck W Or Wo Contrast Result Date: 06/25/2017 IMPRESSION: 1. Occlusion of the right common carotid artery at its mid aspect, age indeterminate, and may be chronic given at this would not fit with patient's current symptoms. Right carotid artery system is occluded to the level of the circle of Willis. Distal reconstitution at the level of the right ICA terminus with fairly robust flow throughout the right MCA territory. 2. Extensive atheromatous irregularity throughout the left ICA and MCA without large vessel occlusion. Moderate carotid siphon atherosclerosis without high-grade or correctable stenosis. 3. Extensive atheromatous irregularity throughout the vertebrobasilar system, with moderate to severe multifocal stenoses involving the basilar artery.  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 Distal PCA branches not well visualized, suspected to be occluded given the chronic bilateral PCA territory infarcts. 4. 3 mm focal outpouching arising at the confluence of the right P1 segment and posterior communicating artery,  suspicious for small aneurysm. Critical Value/emergent results were called by telephone at the time of interpretation on 06/25/2017 at 7:47 pm to Dr. Ferd Glassing , who verbally acknowledged these results. Electronically Signed   By: Rise Mu M.D.   On: 06/25/2017 20:00   Ct Cervical Spine Wo Contrast Result Date: 06/25/2017 IMPRESSION: 1. No acute intracranial abnormality or significant interval change. 2. Aspects 10/10 3. 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 4. Stable remote lacunar infarcts of the basal ganglia. 5. Extensive vascular calcifications. 6. Stable multilevel spondylosis cervical spine without acute abnormality. 7.  Aortic Atherosclerosis (ICD10-I70.0). These results were called by telephone at the time of interpretation on 06/25/2017 at 6:10 pm to Dr. Bethann Berkshire , who verbally acknowledged these results. Electronically Signed   By: Marin Roberts M.D.   On: 06/25/2017 18:10   Mr Brain Wo Contrast Result Date: 06/27/2017 IMPRESSION: 1. 2 cm acute ischemic nonhemorrhagic infarct involving the posterior limb of the right internal capsule. 2. Multiple remote infarcts involving the bilateral basal ganglia, right thalamus, and left paramedian pons, with additional remote bilateral PCA territory infarcts. Additional areas of bifrontal encephalomalacia favored to be posttraumatic in nature given location and morphology. 3. 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 4. Moderate cerebral atrophy with chronic microvascular ischemic disease. Electronically Signed   By: Rise Mu M.D.   On: 06/27/2017 00:32   Ct Head Code Stroke Wo Contrast Result Date: 06/25/2017 IMPRESSION: 1. No acute intracranial abnormality or significant interval change. 2. Aspects 10/10 3. 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 4. Stable remote lacunar infarcts of  the basal ganglia. 5. Extensive vascular calcifications. 6. Stable multilevel spondylosis cervical spine without acute abnormality. 7.  Aortic Atherosclerosis (ICD10-I70.0). These results were called by telephone at the time of interpretation on 06/25/2017 at 6:10 pm to Dr. Bethann Berkshire , who verbally acknowledged these results. Electronically Signed   By: Marin Roberts M.D.   On: 06/25/2017 18:10   Echocardiogram:     Study Conclusions - Left ventricle: The cavity size was normal. There was moderate   concentric hypertrophy. Systolic function was mildly to   moderately reduced. The estimated ejection fraction was in the   range of 40% to 45%. Diffuse hypokinesis. Doppler parameters are   consistent with abnormal left ventricular relaxation (grade 1   diastolic dysfunction). - Aortic valve: There was moderate regurgitation. - Mitral valve: There was mild regurgitation. - Right ventricle: Systolic function was normal. - Right atrium: The atrium was normal in size. - Tricuspid valve: There was no regurgitation. - Pulmonary arteries: Systolic pressure could not be accurately   estimated. - Inferior vena cava: The vessel was normal in size. - Pericardium, extracardiac: There was no pericardial effusion.                                              IMPRESSION: 77 year old male with history of CAD/MI status post CABG in 2013, perioperative A. fib and LV thrombus post MI in 2013, bilateral carotid  stenosis status post left CEA in 2013, diabetes, hypertension, hyperlipidemia, CHF, severe PAD status post bypass, status post ascending aorta repair, and stroke admitted for dysarthria, right facial droop, and likely old right field cut.  CT showed remote infarcts and left PCA and bilateral MCA and basal ganglia.  CTA head and neck showed right CCA chronic occlusion, left ICA siphon atherosclerosis, left VA, PA multifocal stenosis, right PCA distal stenosis.  Status post CVA and right VA  hypoplastic.  MRI showed right internal capsule small infarct.  2D echo pending.  LDL 117, A1c 4.9, UDS negative.  According to his medication list, he is supposed to be on Coumadin, however, he has not been taking.  INR 1.08 on admission.  Patient living condition is concerning.  He and his son lives in the hotel/motel, not able to afford medication and INR check as well as Dr. appointment.  Currently he is on aspirin 81, Lipitor 80 and Coumadin with INR goal 2-3.  Case manager and social worker is working on placement.  Severe dysarthria on pured diet and honey thick liquid.  PT OT recommend CIR.  2 cm acute ischemic nonhemorrhagic infarct posterior limb of the right internal capsule.  Suspected Etiology: Atrial fibrillation with subtherapeutic INR Resultant Symptoms: Right facial droop, dysarthria, right field cut, right-sided weakness Stroke Risk Factors: atrial fibrillation, hyperlipidemia and hypertension Other Stroke Risk Factors: Advanced age, Hx stroke, CAD, PAD  Outstanding Stroke Work-up Studies:    Workup complete at this time  06/28/2017 ASSESSMENT:   Neurology exam remains stable with right facial droop dysarthria right field cut and right-sided weakness.  Working with therapies.  Some low blood pressures noted overnight.  Compression stockings ordered.  Avoid dehydration, p.o. fluid encouraged.  No family at bedside.  Past modified barium swallow.  Patient will need close PCP follow-up and assistance from case management.  Neurology follow-up in 6 weeks.  PLAN  06/28/2017: Continue Aspirin/Coumadin Irven Easterly Avoid episodes of hypotension and dehydration C compression stockings ordered Frequent neuro checks Telemetry monitoring PT/OT/SLP Consult PM & Rehab Consult Case Management /MSW Ongoing aggressive stroke risk factor management Patient counseled to be compliant with his antithrombotic medications Patient counseled on Lifestyle modifications including, Diet, Exercise, and  Stress Follow up with [facility] in 6 weeks  HX OF STROKES: Stable remote infarct left occipital lobe Bilateral parietal lobes Stable remote lacunar infarcts of the basal ganglia.   3 mm focal outpouching right P1 segment & posterior communicating artery, suspicious for small aneurysm Stable Outpatient neurology surveillance as appropriate  INTRACRANIAL Atherosclerosis &Stenosis: On DAPT, ASA and Coumadin for now  History of atrial fibrillation, chronic Admission INR subtherapeutic Patient states he was not taking Coumadin, for unknown reason. Coumadin to be restarted per pharmacy  DYSPHAGIA: Passed MBS swallow evaluation -dysphasia 1 diet Aspiration Precautions in progress  HYPERTENSION: Stable, hypotension noted overnight Avoid hypotension Permissive hypertension (OK if <220/120) for 24-48 hours post stroke and then gradually normalized within 5-7 days. Long term BP goal normotensive. May slowly start  B/P medications, if necessary Home Meds: NONE  HYPERLIPIDEMIA:    Component Value Date/Time   CHOL 176 06/26/2017 0500   TRIG 98 06/26/2017 0500   HDL 39 (L) 06/26/2017 0500   CHOLHDL 4.5 06/26/2017 0500   VLDL 20 06/26/2017 0500   LDLCALC 117 (H) 06/26/2017 0500  Home Meds: Lipitor 20 mg LDL  goal < 70 Increased to  Lipitor to 80 mg daily Continue statin at discharge  R/O DIABETES: Lab Results  Component Value Date   HGBA1C 4.9 06/26/2017  HgbA1c goal < 7.0  Other Active Problems: Principal Problem:   CVA (cerebral vascular accident) (HCC) Active Problems:   HTN (hypertension)   Left ventricular apical thrombus following MI, NSTEMI, 01/13/12   CAD (coronary artery disease) status post CABG July 2013   COPD (chronic obstructive pulmonary disease) (HCC)   Ischemic cardiomyopathy, EF 40-45% 2D 01/15/12   History of stroke   S/P carotid endarterectomy   Lt, 01/23/12   CKD (chronic kidney disease) stage 3, GFR 30-59 ml/min (HCC)   Mixed  hyperlipidemia   Right hemiparesis (HCC)   Dysarthria   Dysphagia   Long term (current) use of anticoagulants   Dysphagia, post-stroke   Chronic combined systolic and diastolic CHF (congestive heart failure) (HCC)   Benign essential HTN   PAF (paroxysmal atrial fibrillation) (HCC)   Intracardiac thrombosis   Tachypnea    Hospital day # 3 VTE prophylaxis: Coumadin Diet : DIET DYS 2 Room service appropriate? Yes; Fluid consistency: Nectar Thick   FAMILY UPDATES: No family at bedside  TEAM UPDATES: Alba Cory, MD   Prior Home Stroke Medications:  aspirin 81 mg daily  Discharge Stroke Meds:  Please discharge patient on warfarin daily , ASA 81 mg and Lipitor 80 mg  Disposition: 01-Home or Self Care Therapy Recs:               CIR vs SNF Home Equipment:         PENDING Follow Up:  Follow-up Information    [name], NP. Schedule an appointment as soon as possible for a visit in 6 week(s).   Specialty:  Family Medicine Contact information: [address] [place] [address] [phone]          Default, Provider, MD -PCP Follow up in 1-2 weeks   Case Management aware of need   Assessment & plan discussed with with attending physician and they are in agreement.    Beryl Meager, ANP-C Stroke Neurology Team 06/28/2017 1:10 PM  ATTENDING NOTE: I reviewed above note and agree with the assessment and plan. I have made any additions or clarifications directly to the above note. Pt was seen and examined.   77 year old male with history of CAD/MI status post CABG in 2013, perioperative A. fib and LV thrombus post MI in 2013, bilateral carotid stenosis status post left CEA in 2013, diabetes, hypertension, hyperlipidemia, CHF, severe PAD status post bypass, status post ascending aorta repair, and stroke admitted for dysarthria, right facial droop, and likely old right field cut.  CT showed remote infarcts and left PCA and bilateral MCA and basal  ganglia.  CTA head and neck showed right CCA chronic occlusion, left ICA siphon atherosclerosis, left VA, PA multifocal stenosis, right PCA distal stenosis.  Status post CVA and right VA hypoplastic.  MRI showed right internal capsule small infarct.  2D echo pending.  LDL 117, A1c 4.9, UDS negative.  According to his medication list, he is supposed to be on Coumadin, however, he has not been taking.  INR 1.08 on admission.  Patient living condition is concerning.  He and his son lives in the hotel/motel, not able to afford medication and INR check as well as Dr. appointment.  Currently he is on aspirin 81, Lipitor 80 and Coumadin with INR goal 2-3.  Severe dysarthria on pured diet and honey thick liquid.  PT OT recommend CIR.   Neurology will sign off. Please call with  questions. Pt will follow up with [name], NP, at [facility] in about 6 weeks. Thanks for the consult.  Marvel Plan, MD PhD Stroke Neurology 06/28/2017 5:27 PM   To contact Stroke Continuity provider, please refer to [email or web address]. After hours, contact General Neurology

## 2017-06-28 NOTE — Progress Notes (Signed)
Patient transferred to CIR report called. Left floor via wheelchair accompanied by staff.  [place] Kinyon, Kae Heller, RN

## 2017-06-28 NOTE — Progress Notes (Signed)
Patient ID: Charles Hatfield, male   DOB: 04-17-1941, 77 y.o.   MRN: [id number] Patient arrived from 3W13 with RN and patient belongings. Patient oriented to room, nurse call system, rehab schedule, and rehab process. Patient resenting in bed with no complaints of pain, bed alarm on, and call bell at side.

## 2017-06-28 NOTE — Progress Notes (Signed)
Inpatient Rehabilitation  I have spoken with patient's son and he is eager for his dad to get to IP Rehab toady.  I note patient has been discharged and have a bed to offer today.  Will proceed with admission.  Call if questions.   Charlane Ferretti., CCC/SLP Admission Coordinator  [facility] Inpatient Rehabilitation  Cell [phone]

## 2017-06-28 NOTE — PMR Pre-admission (Signed)
PMR Admission Coordinator Pre-Admission Assessment  Patient: Charles Hatfield is an 77 y.o., male MRN: [id number] DOB: 1941-03-20 Height:   Weight: 57.1 kg (125 lb 14.1 oz)              Insurance Information HMO:     PPO:      PCP:      IPA:      80/20:      OTHER:  PRIMARY: Medicare A & B      Policy#: [id number]      Subscriber: Self CM Name:       Phone#:      Fax#:  Pre-Cert#: Eligible       Employer: Retired Benefits:  Phone #: Verified online      Name: [employer]  Eff. Date: 04/26/06     Deduct: $1364      Out of Pocket Max: N/A      Life Max: N/A CIR: 100%      SNF: 100% days 1-20; 80% days 21-100 Outpatient: 80%     Co-Pay: 20% Home Health: 100%      Co-Pay: none DME: 80%     Co-Pay: 20% Providers: Patient's choice   SECONDARY: BC[employer] PPO      Policy#: [id number]      Subscriber: Self CM Name:       Phone#:      Fax#:  Pre-Cert#:       Employer: Retired  Benefits:  Phone #: [phone]     Name:  Eff. Date:      Deduct:       Out of Pocket Max:       Life Max:  CIR:       SNF:  Outpatient:      Co-Pay:  Home Health:       Co-Pay:  DME:      Co-Pay:   Medicaid Application Date:       Case Manager:  Disability Application Date:       Case Worker:   Emergency Contact Information Contact Information    Name Relation Home Work [place] Son [phone]  [phone]   No name specified         Current Medical History  Patient Admitting Diagnosis: Acute ischemic nonhemorrhagic infarct involving the posterior limb of the right internal capsule  History of Present Illness: Charles Hatfield a 77 y.o.right handedmalewith history of CVA, left CEA, CAD with CABG,COPD with remote tobacco abuse, chronic systolic congestive heart failure,atrial fibrillation/ intracardiac thrombus noncompliant to Coumadin and hypertension.Per chart review patient lives with son. Uses a straight point cane for ambulation and still driving. One level home.  Presented 06/25/2017 with right sided weakness and slurred speech. INR on admission of 1.08.UDS negative.Cranial CTunremarkable for acute process. Per report, stable remote infarct left occipital lobe and bilateral parietal lobes.CT angiogram head and neck showed occlusion of the right common carotid artery at its mid aspect age indeterminate. Extensive atheromatous irregularity throughout the left ICA and MCA without large vessel occlusion.3 mm focal outpouching arising at the confluence of the right P1 segment posterior communicating artery suspicious for small aneurysm. Patient did not receive TPA.MRI the brainreviewed, showing right internal capsule CVA. Per report,2 cm acute ischemic nonhemorrhagic infarct involving the posterior limb of the right internal capsule.Echocardiogramwith ejection fraction of 45% grade 1 diastolic dysfunction.Chronic Coumadin ongoing.Initiallymaintained on a dysphagia 1 Honey thick liquid dietwith swallow study 06/28/2016 and diet advanced to dysphagia #2 nectar thick  liquids.Physicaland occupationaltherapy evaluationscompleted 06/27/2016 with recommendations of physical medicine rehabilitation consult.Patient was admitted for a comprehensive rehabilitation program 06/28/17.   NIH Total: 8    Past Medical History  Past Medical History:  Diagnosis Date  . Atrial fibrillation (HCC)    perioperative  . CAD (coronary artery disease)   . Carotid stenosis    bilateral  . COPD (chronic obstructive pulmonary disease) (HCC)   . Critical lower limb ischemia   . Diabetes mellitus   . DM (diabetes mellitus) (HCC) 01/15/2012  . Dyslipidemia   . H/O ascending aorta repair   . Hematuria 01/15/2012  . Hypertension   . Ischemic cardiomyopathy   . Left ventricular apical thrombus following MI, NSTEMI, 01/13/12 01/15/2012  . LV (left ventricular) mural thrombus    inferapical  . S/P CABG x 3 01/22/2012   LIMA to LAD,SVG to second diagonal,SVG to distal RCA  . Staghorn  renal calculus 01/15/2012  . Stroke (HCC)   . Thrombocytopenia (HCC)     Family History  family history includes Heart attack in his brother; Heart disease in his father.  Prior Rehab/Hospitalizations:  Has the patient had major surgery during 100 days prior to admission? No  Current Medications   Current Facility-Administered Medications:  .  acetaminophen (TYLENOL) tablet 650 mg, 650 mg, Oral, Q4H PRN **OR** acetaminophen (TYLENOL) solution 650 mg, 650 mg, Per Tube, Q4H PRN **OR** acetaminophen (TYLENOL) suppository 650 mg, 650 mg, Rectal, Q4H PRN, Delano Metz, MD .  albuterol (PROVENTIL) (2.5 MG/3ML) 0.083% nebulizer solution 3 mL, 3 mL, Inhalation, Q6H PRN, Delano Metz, MD .  aspirin EC tablet 81 mg, 81 mg, Oral, Daily, Delano Metz, MD, 81 mg at 06/28/17 1137 .  atorvastatin (LIPITOR) tablet 80 mg, 80 mg, Oral, q1800, Tyrone Nine, MD, 80 mg at 06/27/17 1706 .  RESOURCE THICKENUP CLEAR, , Oral, PRN, Tyrone Nine, MD .  senna-docusate (Senokot-S) tablet 1 tablet, 1 tablet, Oral, QHS PRN, Delano Metz, MD .  warfarin (COUMADIN) tablet 2.5 mg, 2.5 mg, Oral, ONCE-1800, Almon Hercules, [place] .  Warfarin - Pharmacist Dosing Inpatient, , Does not apply, q1800, [place], Deirdre Evener, RPH  Patients Current Diet: DIET DYS 2 Room service appropriate? Yes; Fluid consistency: Nectar Thick  Precautions / Restrictions Precautions Precautions: Fall Precaution Comments: increased secretions with difficulty managing them Restrictions Weight Bearing Restrictions: No   Has the patient had 2 or more falls or a fall with injury in the past year?Yes  Prior Activity Level Limited Community (1-2x/wk): Prior to admission patient resided with son who, was with him most of the time and assisted his dad as needed.  Patient did not go out alot and was resistant to medical care and medications.  Patient enjoys his cats.    Home Assistive Devices / Equipment Home Assistive Devices/Equipment: Environmental consultant  (specify type), Wheelchair, Bedside commode/3-in-1, Cane (specify quad or straight), Shower chair with back(urinal, depends, walker with wheels and seat) Home Equipment: Cane - single point  Prior Device Use: Indicate devices/aids used by the patient prior to current illness, exacerbation or injury? Patient had devices but liked to furniture walk   Prior Functional Level Prior Function Level of Independence: Independent Comments: Reports driving and using SPC for ambulaton PTA as needed. Lives with son.  Self Care: Did the patient need help bathing, dressing, using the toilet or eating? Needed some help  Indoor Mobility: Did the patient need assistance with walking from room to room (with or without device)? Independent  Stairs: Did  the patient need assistance with internal or external stairs (with or without device)? Needed some help  Functional Cognition: Did the patient need help planning regular tasks such as shopping or remembering to take medications? Needed some help  Current Functional Level Cognition  Arousal/Alertness: Awake/alert Overall Cognitive Status: Difficult to assess Difficult to assess due to: Impaired communication Orientation Level: Oriented to person, Oriented to place, Disoriented to time, Oriented to situation General Comments: Pt following one step commands consistenlty. Difficult to understand for formal cognitive testing. A&Ox4 but does not know date. Jokes appropriately.  Attention: Focused, Sustained Focused Attention: Appears intact Sustained Attention: Appears intact Memory: Impaired Memory Impairment: Decreased short term memory Decreased Short Term Memory: Verbal basic Problem Solving: Impaired Problem Solving Impairment: Verbal basic    Extremity Assessment (includes Sensation/Coordination)  Upper Extremity Assessment: Generalized weakness RUE Sensation: (WFL)  Lower Extremity Assessment: Defer to PT evaluation RLE Deficits / Details: Grossly  ~2+/5 ankle DF, knee extension/flexion, hip flexion. RLE Sensation: [facility]) RLE Coordination: decreased fine motor, decreased gross motor    ADLs  Overall ADL's : Needs assistance/impaired Grooming: Moderate assistance, Standing, Wash/dry hands Grooming Details (indicate cue type and reason): Assist for balance and for identifying ADL items for task Upper Body Bathing: Moderate assistance, Sitting Lower Body Bathing: Maximal assistance, Sit to/from stand Upper Body Dressing : Minimal assistance, Sitting Upper Body Dressing Details (indicate cue type and reason): to doff/don new gown Lower Body Dressing: Maximal assistance, Sit to/from stand Toilet Transfer: Moderate assistance, +2 for physical assistance, +2 for safety/equipment, Ambulation(HHA) Toilet Transfer Details (indicate cue type and reason): Simulated by functional mobility Functional mobility during ADLs: Moderate assistance, +2 for physical assistance, +2 for safety/equipment(HHA)    Mobility  Overal bed mobility: Needs Assistance Bed Mobility: Supine to Sit, Sit to Supine Rolling: Min guard Sidelying to sit: Mod assist, HOB elevated Supine to sit: Mod assist, HOB elevated Sit to supine: Mod assist, HOB elevated General bed mobility comments: Sitting EOB upon PT arrival.     Transfers  Overall transfer level: Needs assistance Equipment used: 2 person hand held assist Transfers: Sit to/from Stand Sit to Stand: Min assist, +2 physical assistance General transfer comment: Cues for hand placement and technique. Min assist +2 for sit to stand from EOB x1 and chair x1.    Ambulation / Gait / Stairs / Wheelchair Mobility  Ambulation/Gait Ambulation/Gait assistance: +2 physical assistance Ambulation Distance (Feet): 50 Feet Assistive device: 2 person hand held assist Gait Pattern/deviations: Step-to pattern, Narrow base of support, Decreased stance time - right, Decreased weight shift to right General Gait Details: Cues to  widen BoS; increased WB through BUEs. Some knee instability noted LLE. Pt veering left and then right about to run into doorway and IV pole in front of him. Visual deficits bilaterally noted.  Gait velocity: decreased Gait velocity interpretation: <1.8 ft/sec, indicative of risk for recurrent falls    Posture / Balance Dynamic Sitting Balance Sitting balance - Comments: Min guard throughout. Able to scoot self to EOB without LOB. Balance Overall balance assessment: Needs assistance Sitting-balance support: Feet supported, No upper extremity supported Sitting balance-Leahy Scale: Fair Sitting balance - Comments: Min guard throughout. Able to scoot self to EOB without LOB. Standing balance support: No upper extremity supported, During functional activity Standing balance-Leahy Scale: Poor Standing balance comment: Min assist for static standing without UE support. Able to stand at sink and wash hands with cues due to visual deficits. Difficulty turning on faucet and finding paper towels without  cues.     Special needs/care consideration BiPAP/CPAP: No CPM: No Continuous Drip IV: No Dialysis: No         Life Vest: No Oxygen: No Special Bed: No Trach Size: No Wound Vac (area): No       Skin: Dry, Abrasion and bruising to left knee and bilateral arms                                Bowel mgmt: PTA 06/25/17 Bladder mgmt: External catheter, amber and malodorous per acute flow chart  Diabetic mgmt: HGBA1c 4.9, goal <7.0      Previous Home Environment Living Arrangements: Children  Lives With: Son Available Help at Discharge: Family, Available PRN/intermittently Type of Home: House Home Layout: One level Home Access: Level entry Firefighter: Standard Home Care Services: No Additional Comments: Information obtained from PT eval  Discharge Living Setting Plans for Discharge Living Setting: Other (Comment)(Motel/extended stay) Type of Home at Discharge: Other  (Comment)(Motel/extended stay, son is working on [employer] housing) Discharge Home Layout: One level Discharge Home Access: Level entry Discharge Bathroom Shower/Tub: Tub/shower unit Discharge Bathroom Toilet: Standard Discharge Bathroom Accessibility: Yes How Accessible: Accessible via walker Does the patient have any problems obtaining your medications?: No  Social/Family/Support Systems Patient Roles: Parent Contact Information: Son: Casimiro Needle  Anticipated Caregiver: Son  Anticipated Caregiver's Contact Information: see above  Ability/Limitations of Caregiver: Son works 3-4 hours at night when his dad is asleep  Caregiver Availability: 24/7(short term, requesting assist ) Discharge Plan Discussed with Primary Caregiver: Yes Is Caregiver In Agreement with Plan?: Yes Does Caregiver/Family have Issues with Lodging/Transportation while Pt is in Rehab?: Yes  Goals/Additional Needs Patient/Family Goal for Rehab: PT/OT/SLP: Supervision-Min A Expected length of stay: 14-17 days  Cultural Considerations: Catholic  Dietary Needs: Dys.2 textures and nectar-thick liquids  Equipment Needs: TBD Special Service Needs: Son requesting assist with getting assistance for his dad, does he qualify for disability or caregiver some? Additional Information: see above Pt/Family Agrees to Admission and willing to participate: Yes Program Orientation Provided & Reviewed with Pt/Caregiver Including Roles  & Responsibilities: Yes Additional Information Needs: see above  Information Needs to be Provided By: CSW  Barriers to Discharge: Lack of/limited family support, Medication compliance, Other (comments)(Housing, ? Qualify for disability, or caregiver  )  Decrease burden of Care through IP rehab admission: No  Possible need for SNF placement upon discharge: Not anticipated   Patient Condition: This patient's condition remains as documented in the consult dated 06/27/17, in which the Rehabilitation Physician  determined and documented that the patient's condition is appropriate for intensive rehabilitative care in an inpatient rehabilitation facility. Will admit to inpatient rehab today.  Preadmission Screen Completed By:  Fae Pippin, 06/28/2017 3:43 PM ______________________________________________________________________   Discussed status with Dr. Wynn Banker on 06/28/17 at 1553 and received telephone approval for admission today.  Admission Coordinator:  Fae Pippin, time 0981 Dorna Bloom 06/28/17

## 2017-06-29 ENCOUNTER — Inpatient Hospital Stay (HOSPITAL_COMMUNITY): Payer: Federal, State, Local not specified - PPO

## 2017-06-29 ENCOUNTER — Encounter (HOSPITAL_COMMUNITY): Payer: Self-pay

## 2017-06-29 ENCOUNTER — Inpatient Hospital Stay (HOSPITAL_COMMUNITY): Payer: Federal, State, Local not specified - PPO | Admitting: Physical Therapy

## 2017-06-29 ENCOUNTER — Inpatient Hospital Stay (HOSPITAL_COMMUNITY): Payer: Federal, State, Local not specified - PPO | Admitting: Speech Pathology

## 2017-06-29 LAB — CBC WITH DIFFERENTIAL/PLATELET
Basophils Absolute: 0 10*3/uL (ref 0.0–0.1)
Basophils Relative: 1 %
EOS ABS: 0.3 10*3/uL (ref 0.0–0.7)
EOS PCT: 4 %
HCT: 40.3 % (ref 39.0–52.0)
Hemoglobin: 13.5 g/dL (ref 13.0–17.0)
LYMPHS ABS: 1.9 10*3/uL (ref 0.7–4.0)
LYMPHS PCT: 33 %
MCH: 32.3 pg (ref 26.0–34.0)
MCHC: 33.5 g/dL (ref 30.0–36.0)
MCV: 96.4 fL (ref 78.0–100.0)
MONO ABS: 0.5 10*3/uL (ref 0.1–1.0)
MONOS PCT: 9 %
Neutro Abs: 3.2 10*3/uL (ref 1.7–7.7)
Neutrophils Relative %: 53 %
PLATELETS: 136 10*3/uL — AB (ref 150–400)
RBC: 4.18 MIL/uL — ABNORMAL LOW (ref 4.22–5.81)
RDW: 13.9 % (ref 11.5–15.5)
WBC: 5.9 10*3/uL (ref 4.0–10.5)

## 2017-06-29 LAB — COMPREHENSIVE METABOLIC PANEL
ALT: 10 U/L — AB (ref 17–63)
ANION GAP: 7 (ref 5–15)
AST: 22 U/L (ref 15–41)
Albumin: 3.4 g/dL — ABNORMAL LOW (ref 3.5–5.0)
Alkaline Phosphatase: 66 U/L (ref 38–126)
BUN: 20 mg/dL (ref 6–20)
CHLORIDE: 108 mmol/L (ref 101–111)
CO2: 25 mmol/L (ref 22–32)
CREATININE: 1.21 mg/dL (ref 0.61–1.24)
Calcium: 8.7 mg/dL — ABNORMAL LOW (ref 8.9–10.3)
GFR, EST NON AFRICAN AMERICAN: 56 mL/min — AB (ref >60)
Glucose, Bld: 92 mg/dL (ref 65–99)
Potassium: 4.4 mmol/L (ref 3.5–5.1)
SODIUM: 140 mmol/L (ref 135–145)
Total Bilirubin: 0.7 mg/dL (ref 0.3–1.2)
Total Protein: 5.7 g/dL — ABNORMAL LOW (ref 6.5–8.1)

## 2017-06-29 LAB — PROTIME-INR
INR: 2.07
Prothrombin Time: 23.1 s — ABNORMAL HIGH (ref 11.4–15.2)

## 2017-06-29 MED ORDER — WARFARIN SODIUM 2.5 MG PO TABS
2.5000 mg | ORAL_TABLET | Freq: Once | ORAL | Status: AC
  Administered 2017-06-29: 2.5 mg via ORAL
  Filled 2017-06-29: qty 1

## 2017-06-29 NOTE — Progress Notes (Signed)
Social Work Patient ID: Charles Hatfield, male   DOB: 1940/09/05, 77 y.o.   MRN: [id number]   CSW met with pt to introduce self and role of CSW, as well as to complete assessment.  Pt's son was supposedly on his way to CIR, but did not come while CSW was on unit.  CSW shared with pt that son had called Gunnar Fusi, admissions coordinator, to let her know he had car trouble and would be late to CIR to bring pt's clothes and go through evaluations with pt.  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 Pt admits that pt has poor eye sight and should not be driving anyway, but still wants his [place] Lei to be able to run.  Pt reports a fair relationship with his only child, but CSW was not able to verify with son.  Left son a message to let him know CSW would be working with pt.  Await return call.  CSW will work with pt and son to transition pt home with f/u therapies, DME, and PCP.  Pt will accept CSW's assistance with finding a PCP - no preference on gender.  Pt was emotional at one point during assessment and CSW offered support - will refer to Neuropsychologist, as well.  CSW will continue to follow and assist as needed.

## 2017-06-29 NOTE — Evaluation (Signed)
Occupational Therapy Assessment and Plan  Patient Details  Name: Charles Hatfield MRN: [id number] Date of Birth: 06-27-1940  OT Diagnosis: blindness and low vision, cognitive deficits, disturbance of vision, hemiplegia affecting dominant side and muscle weakness (generalized) Rehab Potential: Rehab Potential (ACUTE ONLY): Good ELOS: 14-18   Today's Date: 06/29/2017 OT Individual Time: 0700-0800 OT Individual Time Calculation (min): 60 min     Problem List:  Patient Active Problem List   Diagnosis Date Noted  . Embolic cerebral infarction ([place]) 06/28/2017  . Hemiparesis affecting right side as late effect of cerebrovascular accident (CVA) ([place])   . Residual cognitive deficit as late effect of stroke   . Dysphagia as late effect of stroke   . Chronic combined systolic and diastolic CHF (congestive heart failure) ([place])   . Benign essential HTN   . PAF (paroxysmal atrial fibrillation) ([place])   . Intracardiac thrombosis   . Tachypnea   . Right hemiparesis ([place]) 06/25/2017  . Dysarthria 06/25/2017  . Dysphagia 06/25/2017  . CVA (cerebral vascular accident) ([place]) 06/25/2017  . Long term (current) use of anticoagulants 06/25/2017  . Mixed hyperlipidemia 06/15/2013  . CKD (chronic kidney disease) stage 3, GFR 30-59 ml/min (HCC) 03/02/2013  . Critical lower limb ischemia 02/10/2013  . Preop cardiovascular exam 12/18/2012  . Long term current use of anticoagulant therapy 09/12/2012  . Occlusion and stenosis of carotid artery without mention of cerebral infarction 02/13/2012  . S/P CABG x 3     01/23/12 01/24/2012  . S/P carotid endarterectomy   Lt, 01/23/12 01/24/2012  . Carotid arterial disease, severe LICA stenosis 81/03/3158  . History of stroke 01/18/2012  . CAD (coronary artery disease) status post CABG July 2013 01/17/2012  . COPD (chronic obstructive pulmonary disease) ([place]) 01/17/2012  . Acute renal insufficiency, SCr up to 1.43 01/17/2012  . Ischemic cardiomyopathy, EF 40-45% 2D  01/15/12 01/17/2012  . Staghorn renal calculus 01/15/2012  . Left ventricular apical thrombus following MI, NSTEMI, 01/13/12 01/15/2012  . DM (diabetes mellitus) ([place]) 01/15/2012  . NSTEMI (non-ST elevated myocardial infarction) ([place]) 01/14/2012  . HTN (hypertension) 01/14/2012  . Thrombocytopenia ([place]) 01/14/2012  . Tobacco abuse 01/14/2012  . H/O ascending aorta repair     Past Medical History:  Past Medical History:  Diagnosis Date  . Atrial fibrillation ([place])    perioperative  . CAD (coronary artery disease)   . Carotid stenosis    bilateral  . COPD (chronic obstructive pulmonary disease) ([place])   . Critical lower limb ischemia   . Diabetes mellitus   . DM (diabetes mellitus) ([place]) 01/15/2012  . Dyslipidemia   . H/O ascending aorta repair   . Hematuria 01/15/2012  . Hypertension   . Ischemic cardiomyopathy   . Left ventricular apical thrombus following MI, NSTEMI, 01/13/12 01/15/2012  . LV (left ventricular) mural thrombus    inferapical  . S/P CABG x 3 01/22/2012   LIMA to LAD,SVG to second diagonal,SVG to distal RCA  . Staghorn renal calculus 01/15/2012  . Stroke ([place])   . Thrombocytopenia ([place])    Past Surgical History:  Past Surgical History:  Procedure Laterality Date  . ABDOMINAL ANGIOGRAM  03/03/2013   Procedure: ABDOMINAL ANGIOGRAM;  Surgeon: Lorretta Harp, MD;  Location: [facility] CATH LAB;  Service: Cardiovascular;;  . CAROTID ENDARTERECTOMY  01/13/2012  . CORONARY ANGIOGRAM  01/16/2012   Procedure: CORONARY ANGIOGRAM;  Surgeon: Sanda Klein, MD;  Location: [facility] CATH LAB;  Service: Cardiovascular;;  . CORONARY ARTERY BYPASS GRAFT  01/22/2012  Procedure: CORONARY ARTERY BYPASS GRAFTING (CABG);  Surgeon: Ivin Poot, MD;  Location: [place];  Service: Open Heart Surgery;  Laterality: N/A;  CABG x three using left internal mammary artery and right leg greater saphenous vein harvested endoscopically  . ENDARTERECTOMY  01/22/2012   Procedure: ENDARTERECTOMY CAROTID;  Surgeon:  Rosetta Posner, MD;  Location: [place];  Service: Vascular;  Laterality: Left;  . LOWER EXTREMITY ANGIOGRAM Left 03/03/2013   Procedure: LOWER EXTREMITY ANGIOGRAM;  Surgeon: Lorretta Harp, MD;  Location: [facility] CATH LAB;  Service: Cardiovascular;  Laterality: Left;  . NM MYOVIEW LTD  02/20/2012   infarct/scar w/mild perinfarct ischemia, EF 38%, abnormal nuc, no sign. ischemia  . REPAIR THORACIC AORTA     Ascending thoracic aorto repair    Assessment & Plan Clinical Impression:  Charles Hatfield a 77 y.o.right handedmalewith history of CVA, left CEA, CAD with CABG,COPD with remote tobacco abuse, chronic systolic congestive heart failure,atrial fibrillation/ intracardiac thrombus noncompliant to Coumadin and hypertension.Per chart review patient lives with son. Uses a straight point cane for ambulation and still driving. One level home. Presented 06/25/2017 with right sided weakness and slurred speech. INR on admission of 1.08.UDS negative.Cranial CTunremarkable for acute process. Per report, stable remote infarct left occipital lobe and bilateral parietal lobes.CT angiogram head and neck showed occlusion of the right common carotid artery at its mid aspect age indeterminate. Extensive atheromatous irregularity throughout the left ICA and MCA without large vessel occlusion.3 mm focal outpouching arising at the confluence of the right P1 segment posterior communicating artery suspicious for small aneurysm. Patient did not receive TPA.MRI the brainreviewed, showing right internal capsule CVA. Per report,2 cm acute ischemic nonhemorrhagic infarct involving the posterior limb of the right internal capsule.Echocardiogramwith ejection fraction of 76% grade 1 diastolic dysfunction.Chronic Coumadin ongoing.Initiallymaintained on a dysphagia 1 Honey thick liquid dietwith swallow study 06/28/2016 and diet advanced to dysphagia #2 nectar thick liquids.Physicaland occupationaltherapy evaluationscompleted  06/27/2016 with recommendations of physical medicine rehabilitation consult.Patient was admitted for acomprehensive rehabilitation program  Patient currently requires min with basic self-care skills secondary to muscle weakness, decreased cardiorespiratoy endurance, decreased coordination, decreased visual acuity and field cut, decreased problem solving and decreased safety awareness and decreased standing balance, decreased postural control, hemiplegia and decreased balance strategies.  Prior to hospitalization, patient could complete ADL/IADL with independent .  Patient will benefit from skilled intervention to increase independence with basic self-care skills prior to discharge home with care partner.  Anticipate patient will require 24 hour supervision and follow up home health.  OT - End of Session Activity Tolerance: Tolerates 30+ min activity with multiple rests Endurance Deficit: Yes OT Assessment Rehab Potential (ACUTE ONLY): Good OT Barriers to Discharge: Decreased caregiver support;Other (comments)(vision) OT Barriers to Discharge Comments: unsure if family will be able to provide supervision OT Patient demonstrates impairments in the following area(s): Balance;Cognition;Endurance;Motor;Safety;Vision OT Basic ADL's Functional Problem(s): Eating;Grooming;Bathing;Dressing;Toileting OT Transfers Functional Problem(s): Toilet;Tub/Shower OT Additional Impairment(s): Fuctional Use of Upper Extremity OT Plan OT Intensity: Minimum of 1-2 x/day, 45 to 90 minutes OT Frequency: 5 out of 7 days OT Duration/Estimated Length of Stay: 14-18 OT Treatment/Interventions: Balance/vestibular training;Community reintegration;Disease mangement/prevention;Neuromuscular re-education;Patient/family education;Self Care/advanced ADL retraining;Therapeutic Exercise;UE/LE Coordination activities;Wheelchair propulsion/positioning;Cognitive remediation/compensation;Discharge planning;DME/adaptive equipment  instruction;Functional mobility training;Pain management;Psychosocial support;Skin care/wound managment;Therapeutic Activities;UE/LE Strength taining/ROM;Visual/perceptual remediation/compensation OT Self Feeding Anticipated Outcome(s): S OT Basic Self-Care Anticipated Outcome(s): S OT Toileting Anticipated Outcome(s): S OT Bathroom Transfers Anticipated Outcome(s): S OT Recommendation Patient destination: Home Follow Up Recommendations: Home health OT Equipment  Recommended: To be determined   Skilled Therapeutic Intervention 1;1. Pt educated on role/purpose of OT, ELOS, CIR, POC and pt centered  Goal planning. Pt requesting to shower this date. Pt transfer EOB<>w/c<>TTB throughout session with touching A and HOH A for hand placement. Pt engages at shower level self care retraining, requiring multimodal cueing to locate needed items for bathing. Pt able to sequence bathing body parts wihtout cues and crosses BLE into seated figure four for BLE bathing. Pt stands with min A to wash buttocks. OT provides A to wash back. Seated at sink pt able to locate comb with cue to look for "black" however pt asks "what is it?" Pt combs hair and applies deodorant with supervsion. Pt declines donning clothes at this time and dons hospital gown. Exited session with pt suine in bed, call light in reach and all needs met.   OT Evaluation Precautions/Restrictions  Precautions Precautions: Fall Precaution Comments: visual deficits Restrictions Weight Bearing Restrictions: No General Chart Reviewed: Yes Vital Signs Therapy Vitals Temp: 98.9 F (37.2 C) Temp Source: Oral Pulse Rate: 66 Resp: 17 BP: (!) 142/78 Patient Position (if appropriate): Lying Oxygen Therapy SpO2: 97 % O2 Device: Not Delivered Pain Pain Assessment Pain Assessment: No/denies pain Home Living/Prior Functioning Home Living Family/patient expects to be discharged to:: Private residence Living Arrangements: Children Available  Help at Discharge: Family, Available PRN/intermittently Type of Home: House Home Access: Level entry Home Layout: One level Biochemist, clinical: Standard  Lives With: Son Prior Function Level of Independence: Independent with basic ADLs ADL   Vision Baseline Vision/History: No visual deficits Vision Assessment?: Vision impaired- to be further tested in functional context Additional Comments: Pt requires VC to locate items and HOH A 50% of time to correctly reach to grab needed items during ADL Perception  Perception: Within Functional Limits Praxis Praxis: Intact Cognition Overall Cognitive Status: Difficult to assess Arousal/Alertness: Awake/alert Orientation Level: (difficult to assess 2/2 dysarthria) Memory: Impaired Sensation Sensation Light Touch: Appears Intact Hot/Cold: Appears Intact Coordination Gross Motor Movements are Fluid and Coordinated: Yes Fine Motor Movements are Fluid and Coordinated: No Motor  Motor Motor: Hemiplegia(mild) Mobility  Bed Mobility Bed Mobility: Supine to Sit Supine to Sit: 5: Supervision;HOB elevated;With rails Transfers Sit to Stand: 4: Min guard Stand to Sit: 4: Min guard  Trunk/Postural Assessment  Cervical Assessment Cervical Assessment: Within Functional Limits Thoracic Assessment Thoracic Assessment: Within Functional Limits Lumbar Assessment Lumbar Assessment: Within Functional Limits Postural Control Postural Control: Deficits on evaluation Protective Responses: delayed Postural Limitations: decreased  Balance Balance Balance Assessed: Yes Dynamic Sitting Balance Sitting balance - Comments: Min guard throughout. Able to scoot self to EOB without LOB. Static Standing Balance Static Standing - Balance Support: Right upper extremity supported;During functional activity Static Standing - Level of Assistance: 4: Min assist Dynamic Standing Balance Dynamic Standing - Balance Support: Bilateral upper extremity  supported;During functional activity Dynamic Standing - Level of Assistance: 4: Min assist Extremity/Trunk Assessment RUE Assessment RUE Assessment: Exceptions to WFL(generalized weakness) LUE Assessment LUE Assessment: Within Functional Limits   See Function Navigator for Current Functional Status.   Refer to Care Plan for Long Term Goals  Recommendations for other services: Therapeutic Recreation  Pet therapy and Stress management   Discharge Criteria: Patient will be discharged from OT if patient refuses treatment 3 consecutive times without medical reason, if treatment goals not met, if there is a change in medical status, if patient makes no progress towards goals or if patient is discharged from hospital.  The above assessment, treatment plan, treatment alternatives and goals were discussed and mutually agreed upon: by patient  Tonny Branch 06/29/2017, 8:01 AM

## 2017-06-29 NOTE — Evaluation (Signed)
Speech Language Pathology Assessment and Plan  Patient Details  Name: Charles Hatfield MRN: [id number] Date of Birth: 1940/08/17  SLP Diagnosis: Dysphagia;Dysarthria;Cognitive Impairments  Rehab Potential: Good ELOS: 14-21 days     Today's Date: 06/29/2017 SLP Individual Time: 1000-1100 SLP Individual Time Calculation (min): 60 min   Problem List:  Patient Active Problem List   Diagnosis Date Noted  . Embolic cerebral infarction ([place]) 06/28/2017  . Hemiparesis affecting right side as late effect of cerebrovascular accident (CVA) ([place])   . Residual cognitive deficit as late effect of stroke   . Dysphagia as late effect of stroke   . Chronic combined systolic and diastolic CHF (congestive heart failure) ([place])   . Benign essential HTN   . PAF (paroxysmal atrial fibrillation) ([place])   . Intracardiac thrombosis   . Tachypnea   . Right hemiparesis ([place]) 06/25/2017  . Dysarthria 06/25/2017  . Dysphagia 06/25/2017  . CVA (cerebral vascular accident) ([place]) 06/25/2017  . Long term (current) use of anticoagulants 06/25/2017  . Mixed hyperlipidemia 06/15/2013  . CKD (chronic kidney disease) stage 3, GFR 30-59 ml/min (HCC) 03/02/2013  . Critical lower limb ischemia 02/10/2013  . Preop cardiovascular exam 12/18/2012  . Long term current use of anticoagulant therapy 09/12/2012  . Occlusion and stenosis of carotid artery without mention of cerebral infarction 02/13/2012  . S/P CABG x 3     01/23/12 01/24/2012  . S/P carotid endarterectomy   Lt, 01/23/12 01/24/2012  . Carotid arterial disease, severe LICA stenosis 69/48/5462  . History of stroke 01/18/2012  . CAD (coronary artery disease) status post CABG July 2013 01/17/2012  . COPD (chronic obstructive pulmonary disease) ([place]) 01/17/2012  . Acute renal insufficiency, SCr up to 1.43 01/17/2012  . Ischemic cardiomyopathy, EF 40-45% 2D 01/15/12 01/17/2012  . Staghorn renal calculus 01/15/2012  . Left ventricular apical thrombus following MI,  NSTEMI, 01/13/12 01/15/2012  . DM (diabetes mellitus) ([place]) 01/15/2012  . NSTEMI (non-ST elevated myocardial infarction) ([place]) 01/14/2012  . HTN (hypertension) 01/14/2012  . Thrombocytopenia ([place]) 01/14/2012  . Tobacco abuse 01/14/2012  . H/O ascending aorta repair    Past Medical History:  Past Medical History:  Diagnosis Date  . Atrial fibrillation ([place])    perioperative  . CAD (coronary artery disease)   . Carotid stenosis    bilateral  . COPD (chronic obstructive pulmonary disease) ([place])   . Critical lower limb ischemia   . Diabetes mellitus   . DM (diabetes mellitus) ([place]) 01/15/2012  . Dyslipidemia   . H/O ascending aorta repair   . Hematuria 01/15/2012  . Hypertension   . Ischemic cardiomyopathy   . Left ventricular apical thrombus following MI, NSTEMI, 01/13/12 01/15/2012  . LV (left ventricular) mural thrombus    inferapical  . S/P CABG x 3 01/22/2012   LIMA to LAD,SVG to second diagonal,SVG to distal RCA  . Staghorn renal calculus 01/15/2012  . Stroke ([place])   . Thrombocytopenia ([place])    Past Surgical History:  Past Surgical History:  Procedure Laterality Date  . ABDOMINAL ANGIOGRAM  03/03/2013   Procedure: ABDOMINAL ANGIOGRAM;  Surgeon: Lorretta Harp, MD;  Location: [facility] CATH LAB;  Service: Cardiovascular;;  . CAROTID ENDARTERECTOMY  01/13/2012  . CORONARY ANGIOGRAM  01/16/2012   Procedure: CORONARY ANGIOGRAM;  Surgeon: Sanda Klein, MD;  Location: [facility] CATH LAB;  Service: Cardiovascular;;  . CORONARY ARTERY BYPASS GRAFT  01/22/2012   Procedure: CORONARY ARTERY BYPASS GRAFTING (CABG);  Surgeon: Ivin Poot, MD;  Location: [place];  Service: Open Heart Surgery;  Laterality: N/A;  CABG x three using left internal mammary artery and right leg greater saphenous vein harvested endoscopically  . ENDARTERECTOMY  01/22/2012   Procedure: ENDARTERECTOMY CAROTID;  Surgeon: Rosetta Posner, MD;  Location: [place];  Service: Vascular;  Laterality: Left;  . LOWER EXTREMITY ANGIOGRAM Left  03/03/2013   Procedure: LOWER EXTREMITY ANGIOGRAM;  Surgeon: Lorretta Harp, MD;  Location: [facility] CATH LAB;  Service: Cardiovascular;  Laterality: Left;  . NM MYOVIEW LTD  02/20/2012   infarct/scar w/mild perinfarct ischemia, EF 38%, abnormal nuc, no sign. ischemia  . REPAIR THORACIC AORTA     Ascending thoracic aorto repair    Assessment / Plan / Recommendation Clinical Impression   Charles Hatfield a 77 y.o.right handedmalewith history of CVA, left CEA, CAD with CABG,COPD with remote tobacco abuse, chronic systolic congestive heart failure,atrial fibrillation/ intracardiac thrombus noncompliant to Coumadin and hypertension.Per chart review patient lives with son. Uses a straight point cane for ambulation and still driving. One level home. Presented 06/25/2017 with right sided weakness and slurred speech. INR on admission of 1.08.UDS negative.Cranial CTunremarkable for acute process. Per report, stable remote infarct left occipital lobe and bilateral parietal lobes.CT angiogram head and neck showed occlusion of the right common carotid artery at its mid aspect age indeterminate. Extensive atheromatous irregularity throughout the left ICA and MCA without large vessel occlusion.3 mm focal outpouching arising at the confluence of the right P1 segment posterior communicating artery suspicious for small aneurysm. Patient did not receive TPA.MRI the brainreviewed, showing right internal capsule CVA. Per report,2 cm acute ischemic nonhemorrhagic infarct involving the posterior limb of the right internal capsule.Echocardiogramwith ejection fraction of 63% grade 1 diastolic dysfunction.Chronic Coumadin ongoing.Initiallymaintained on a dysphagia 1 Honey thick liquid dietwith swallow study 06/28/2016 and diet advanced to dysphagia #2 nectar thick liquids.Physicaland occupationaltherapy evaluationscompleted 06/27/2016 with recommendations of physical medicine rehabilitation consult.Patient was admitted  for acomprehensive rehabilitation program.  SLP evaluation completed with the following results:  Pt presents with s/s of a moderate oropharyngeal dysphagia.  Pt has decreased management of secretions with intermittently wet vocal quality with and without PO intake and anterior spillage of his saliva.  Pt's swallow response subjectively appears delayed which is likely related to his oral motor weakness and prolonged oral phase.  Pt had suprisingly good manipulation of dys 2 textures given labial and lingual weakness.  For now, recommend that pt remain on his currently prescribed diet.   Pt demonstrates a moderately severe dysarthria resulting form right sided oral motor weakness which impacts articulatory precision of consonants and leads to decreased intelligibility at the phrase level.  Pt currently needs max assist for use of intelligibility strategies.  His expressive and receptive language appear grossly intact for tasks assessed.  Cognition was difficulty to formally assess due to intelligibility; however, pt presents with at least mild impairment for recall of new information, selective attention to tasks, safety awareness, and problem solving.  As a result, pt would benefit from skilled ST while inpatient in order to maximize functional independence and reduce burden of care prior to discharge.  Anticipate that pt will need 24/7 supervision at discharge in addition to [place] follow up at next level of care.    Skilled Therapeutic Interventions          Cognitive-linguistic and bedside swallow evaluation were completed, see above.    SLP Assessment  Patient will need skilled [facility] during CIR admission    Recommendations  SLP  Diet Recommendations: Dysphagia 2 (Fine chop);Nectar Liquid Administration via: Straw;Cup Medication Administration: Crushed with puree Supervision: Patient able to self feed;Full supervision/cueing for compensatory strategies Compensations: Slow  rate;Small sips/bites;Multiple dry swallows after each bite/sip;Monitor for anterior loss Postural Changes and/or Swallow Maneuvers: Seated upright 90 degrees Oral Care Recommendations: Oral care BID Patient destination: Home Follow up Recommendations: Home Health SLP;Outpatient SLP;Skilled Nursing facility;24 hour supervision/assistance Equipment Recommended: To be determined    SLP Frequency 3 to 5 out of 7 days   SLP Duration  SLP Intensity  SLP Treatment/Interventions 14-21 days   Minumum of 1-2 x/day, 30 to 90 minutes  Cognitive remediation/compensation;Cueing hierarchy;Dysphagia/aspiration precaution training;Environmental controls;Internal/external aids;Functional tasks;Patient/family education    Pain Pain Assessment Pain Assessment: No/denies pain  Prior Functioning Cognitive/Linguistic Baseline: Information not available Type of Home: Other(Comment)(lves in a motel)  Lives With: Son Available Help at Discharge: Family;Available PRN/intermittently Vocation: Retired  Function:  Eating Eating   Modified Consistency Diet: Yes Eating Assist Level: Supervision or verbal cues           Cognition Comprehension Comprehension assist level: Follows basic conversation/direction with no assist  Expression   Expression assist level: Expresses basic 25 - 49% of the time/requires cueing 50 - 75% of the time. Uses single words/gestures.  Social Interaction Social Interaction assist level: Interacts appropriately 75 - 89% of the time - Needs redirection for appropriate language or to initiate interaction.  Problem Solving Problem solving assist level: Solves basic 75 - 89% of the time/requires cueing 10 - 24% of the time  Memory Memory assist level: Recognizes or recalls 75 - 89% of the time/requires cueing 10 - 24% of the time   Short Term Goals: Week 1: SLP Short Term Goal 1 (Week 1): Pt will consume therapeutic trials of thin liquids with minimal overt s/s of aspiration  and supervision cues for use of swallowing precautions.   SLP Short Term Goal 2 (Week 1): Pt will consume dys 2 textures and nectar thick liquids with supervision cues for use of swallowing precautions and minimal overt s/s of aspiration.   SLP Short Term Goal 3 (Week 1): Pt will slow rate, overarticulate, and increase vocal intensity to achieve intelligibility at the phrase level with mod assist.  SLP Short Term Goal 4 (Week 1): Pt will complete basic, familiar tasks with min assist for problem solving.   SLP Short Term Goal 5 (Week 1): Pt will selectively attend to basic tasks for 15 minutes with min verbal cues for redirection.   SLP Short Term Goal 6 (Week 1): Pt will recall daily information with mod cues for use of external aids.    Refer to Care Plan for Long Term Goals  Recommendations for other services: None   Discharge Criteria: Patient will be discharged from SLP if patient refuses treatment 3 consecutive times without medical reason, if treatment goals not met, if there is a change in medical status, if patient makes no progress towards goals or if patient is discharged from hospital.  The above assessment, treatment plan, treatment alternatives and goals were discussed and mutually agreed upon: by patient  Emilio Math 06/29/2017, 4:40 PM

## 2017-06-29 NOTE — Progress Notes (Signed)
Physical Medicine and Rehabilitation Consult Reason for Consult: Right side weakness and slurred speech Referring Physician: [facility]   HPI: Charles Hatfield is a 77 y.o.right handed male with history of CVA, left CEA, CAD with CABG,chronic systolic congestive heart failure,atrial fibrillation/ intracardiac thrombus noncompliant to Coumadin and hypertension.Per chart review patient lives with son. Uses a straight point cane for ambulation and still driving. One level home. Presented 06/25/2017 with right sided weakness and slurred speech. INR on admission of 1.08.Cranial CT unremarkable for acute process. Per report, stable remote infarct left occipital lobe and bilateral parietal lobes.CT angiogram head and neck showed occlusion of the right common carotid artery at its mid aspect age indeterminate. Extensive atheromatous irregularity throughout the left ICA and MCA without large vessel occlusion.3 mm focal outpouching arising at the confluence of the right P1 segment posterior communicating artery suspicious for small aneurysm. Patient did not receive TPA.MRI the brain reviewed, showing right internal capsule CVA. Per report, 2 cm acute ischemic nonhemorrhagic infarct involving the posterior limb of the right internal capsule.Echocardiogram pending.Chronic Coumadin ongoing.Currently maintained on a dysphagia 1 Honey thick liquid diet.Physical therapy evaluation completed 06/27/2016 with recommendations of physical medicine rehabilitation consult.   Review of Systems  Unable to perform ROS: Mental acuity       Past Medical History:  Diagnosis Date  . Atrial fibrillation (HCC)    perioperative  . CAD (coronary artery disease)   . Carotid stenosis    bilateral  . COPD (chronic obstructive pulmonary disease) (HCC)   . Critical lower limb ischemia   . Diabetes mellitus   . DM (diabetes mellitus) (HCC) 01/15/2012  . Dyslipidemia   . H/O ascending aorta repair   . Hematuria 01/15/2012    . Hypertension   . Ischemic cardiomyopathy   . Left ventricular apical thrombus following MI, NSTEMI, 01/13/12 01/15/2012  . LV (left ventricular) mural thrombus    inferapical  . S/P CABG x 3 01/22/2012   LIMA to LAD,SVG to second diagonal,SVG to distal RCA  . Staghorn renal calculus 01/15/2012  . Stroke (HCC)   . Thrombocytopenia (HCC)         Past Surgical History:  Procedure Laterality Date  . ABDOMINAL ANGIOGRAM  03/03/2013   Procedure: ABDOMINAL ANGIOGRAM;  Surgeon: Runell Gess, MD;  Location: [facility] CATH LAB;  Service: Cardiovascular;;  . CAROTID ENDARTERECTOMY  01/13/2012  . CORONARY ANGIOGRAM  01/16/2012   Procedure: CORONARY ANGIOGRAM;  Surgeon: Thurmon Fair, MD;  Location: [facility] CATH LAB;  Service: Cardiovascular;;  . CORONARY ARTERY BYPASS GRAFT  01/22/2012   Procedure: CORONARY ARTERY BYPASS GRAFTING (CABG);  Surgeon: Kerin Perna, MD;  Location: [facility] OR;  Service: Open Heart Surgery;  Laterality: N/A;  CABG x three using left internal mammary artery and right leg greater saphenous vein harvested endoscopically  . ENDARTERECTOMY  01/22/2012   Procedure: ENDARTERECTOMY CAROTID;  Surgeon: Larina Earthly, MD;  Location: [facility] OR;  Service: Vascular;  Laterality: Left;  . LOWER EXTREMITY ANGIOGRAM Left 03/03/2013   Procedure: LOWER EXTREMITY ANGIOGRAM;  Surgeon: Runell Gess, MD;  Location: [facility] CATH LAB;  Service: Cardiovascular;  Laterality: Left;  . NM MYOVIEW LTD  02/20/2012   infarct/scar w/mild perinfarct ischemia, EF 38%, abnormal nuc, no sign. ischemia  . REPAIR THORACIC AORTA     Ascending thoracic aorto repair        Family History  Problem Relation Age of Onset  . Heart disease Father   . Heart attack Brother    Social  History:  reports that he quit smoking about 5 years ago. His smoking use included cigarettes. He smoked 1.00 pack per day. he has never used smokeless tobacco. He reports that he does not drink alcohol or use drugs. Allergies: No  Known Allergies       Medications Prior to Admission  Medication Sig Dispense Refill  . acetaminophen (TYLENOL) 500 MG tablet Take 1,000 mg by mouth every 6 (six) hours as needed for pain.     Marland Kitchen aspirin EC 81 MG tablet Take 81 mg by mouth daily.    Marland Kitchen albuterol (PROVENTIL HFA;VENTOLIN HFA) 108 (90 BASE) MCG/ACT inhaler Inhale 2 puffs into the lungs every 6 (six) hours as needed for wheezing. (Patient not taking: Reported on 06/11/2017) 1 Inhaler 6  . atorvastatin (LIPITOR) 20 MG tablet TAKE ONE TABLET BY MOUTH ONCE DAILY (Patient not taking: Reported on 06/11/2017) 30 tablet 0  . ipratropium (ATROVENT HFA) 17 MCG/ACT inhaler Inhale 2 puffs into the lungs every 6 (six) hours as needed for wheezing. (Patient not taking: Reported on 06/11/2017) 1 Inhaler 0  . lisinopril (PRINIVIL,ZESTRIL) 5 MG tablet TAKE ONE TABLET BY MOUTH ONCE DAILY (Patient not taking: Reported on 06/11/2017) 30 tablet 0  . NITROSTAT 0.4 MG SL tablet DISSOLVE ONE TABLET UNDER THE TONGUE EVERY 5 MINUTES AS NEEDED FOR CHEST PAIN.  DO NOT EXCEED A TOTAL OF 3 DOSES IN 15 MINUTES NOW (Patient not taking: Reported on 06/11/2017) 25 tablet 2  . traMADol (ULTRAM) 50 MG tablet TAKE ONE TABLET BY MOUTH EVERY 6 HOURS AS NEEDED FOR MODERATE PAIN (Patient not taking: Reported on 02/06/2016) 60 tablet 0  . warfarin (COUMADIN) 5 MG tablet TAKE ONE TABLET BY MOUTH ONCE DAILY OR AS  DIRECTED  BY  COUMADIN  CLINIC (Patient not taking: Reported on 06/11/2017) 30 tablet 2    Home: Home Living Family/patient expects to be discharged to:: Private residence(or hotel??) Living Arrangements: Children(son) Available Help at Discharge: Family, Available PRN/intermittently Type of Home: House Home Access: Level entry Home Layout: One level Bathroom Toilet: Standard Home Equipment: Cane - single point  Functional History: Prior Function Level of Independence: Independent Comments: Reports driving and using SPC for ambulaton PTA as needed. Lives  with son. Functional Status:  Mobility: Bed Mobility Overal bed mobility: Needs Assistance Bed Mobility: Rolling, Sidelying to Sit Rolling: Min guard Sidelying to sit: Mod assist, HOB elevated General bed mobility comments: Able to initiate movement of LEs to EOB with some assist towards end, cues to reach for rail and assist to elevate trunk to get to EOB.  Transfers Overall transfer level: Needs assistance Equipment used: 1 person hand held assist Transfers: Sit to/from Stand Sit to Stand: Min assist General transfer comment: Assist to power to standing with cues for anterior weight shift with BLEs stabilizing against bed rail. Fearful of falling initially. Ambulation/Gait Ambulation/Gait assistance: Mod assist Ambulation Distance (Feet): 3 Feet Assistive device: 1 person hand held assist Gait Pattern/deviations: Step-to pattern, Narrow base of support, Decreased stance time - right, Trunk flexed, Decreased weight shift to right General Gait Details: Able to take a few steps to get to chair with assist for weight shifting to advance BLEs; some difficulty with motor planning and sequencing. Cues to widen BoS. Gait velocity: decreased Gait velocity interpretation: <1.8 ft/sec, indicative of risk for recurrent falls  ADL:  Cognition: Cognition Overall Cognitive Status: Difficult to assess Orientation Level: Oriented to person, Oriented to place, Disoriented to time, Oriented to situation Cognition Arousal/Alertness: Awake/alert Behavior  During Therapy: WFL for tasks assessed/performed Overall Cognitive Status: Difficult to assess General Comments: A&Ox4; just does not know the year but knows January. Follows multi step commands with repetition and increased time. Requires repetition for questions asked and increased time to respond most likely due to dysarthria.  Difficult to assess due to: Impaired communication  Blood pressure (!) 82/57, pulse 72, temperature 98 F (36.7 C),  temperature source Oral, resp. rate (!) 26, weight 57.1 kg (125 lb 14.1 oz), SpO2 96 %. Physical Exam  Vitals reviewed. Constitutional: He appears well-developed.  Frail  HENT:  Head: Normocephalic and atraumatic.  Eyes: EOM are normal. Right eye exhibits no discharge. Left eye exhibits no discharge.  Neck: Normal range of motion. Neck supple. No thyromegaly present.  Cardiovascular: Normal rate and regular rhythm.  Respiratory:  Fair inspiratory effort clear to auscultation  GI: Soft. Bowel sounds are normal. He exhibits no distension.  Musculoskeletal:  No edema or tenderness in extremities  Neurological:  Severe dysarthria.  Expressive asphasia >> Receptive aphasia Follows simple commands at times. Left inattention Nonverbal Motor, limited by participation: RLE: 2+/5 HF, KE, 4/5 ADF/PF LLE: HF, KE, ADF/PF 4-/5, ADF/PF 4+/5 Bl UE: ?4+/5 proximal to distal  Skin: Skin is warm and dry.  Psychiatric:  Unable to assess due to mentation  Assessment/Plan: Diagnosis: Acute ischemic nonhemorrhagic infarct involving the posterior limb of the right internal capsule Labs and images independently reviewed.  Records reviewed and summated above. Stroke: Continue secondary stroke prophylaxis and Risk Factor Modification listed below:   Antiplatelet therapy:   Blood Pressure Management:  Continue current medication with prn's with permisive HTN per primary team Statin Agent:   ? Right sided hemiparesis: fit for orthosis to prevent contractures (splint at night, PRAFO, etc) Motor recovery: Fluoxetine  1. Does the need for close, 24 hr/day medical supervision in concert with the patient's rehab needs make it unreasonable for this patient to be served in a less intensive setting? Yes  2. Co-Morbidities requiring supervision/potential complications: post-stroke dysphagia (advance diet as tolerated), history of CVA, left CEA, CAD with CABG (cont meds),chronic systolic congestive heart failure  (Monitor in accordance with increased physical activity and avoid UE resistance excercises), atrial fibrillation/ intracardiac thrombus (cont meds, monitor HR with increased physical activity), noncompliant (counsel), HTN, hypotensive at present (monitor and provide prns in accordance with increased physical exertion and pain), tachypnea (monitor RR and O2 Sats with increased physical exertion) 3. Due to bladder management, bowel management, skin/wound care, disease management, medication administration and patient education, does the patient require 24 hr/day rehab nursing? Yes 4. Does the patient require coordinated care of a physician, rehab nurse, PT (1-2 hrs/day, 5 days/week), OT (1-2 hrs/day, 5 days/week) and SLP (1-2 hrs/day, 5 days/week) to address physical and functional deficits in the context of the above medical diagnosis(es)? Yes Addressing deficits in the following areas: balance, endurance, locomotion, strength, transferring, bowel/bladder control, bathing, dressing, feeding, grooming, toileting, cognition, speech, language, swallowing and psychosocial support 5. Can the patient actively participate in an intensive therapy program of at least 3 hrs of therapy per day at least 5 days per week? Potentially 6. The potential for patient to make measurable gains while on inpatient rehab is excellent 7. Anticipated functional outcomes upon discharge from inpatient rehab are supervision and min assist  with PT, supervision and min assist with OT, supervision and min assist with SLP. 8. Estimated rehab length of stay to reach the above functional goals is: 20-25 days. 9. Anticipated D/C setting:  Other 10. Anticipated post D/C treatments: SNF 11. Overall Rehab/Functional Prognosis: good  RECOMMENDATIONS: This patient's condition is appropriate for continued rehabilitative care in the following setting: CIR after completion of medical workup and stable. Patient has agreed to participate in  recommended program. Potentially Note that insurance prior authorization may be required for reimbursement for recommended care.  Comment: Rehab Admissions Coordinator to follow up.  Maryla Morrow, MD, ABPMR Mcarthur Rossetti Angiulli, PA-C 06/27/2017          Revision History

## 2017-06-29 NOTE — Progress Notes (Addendum)
[facility] Individual Statement of Services  Patient Name:  Charles Hatfield  Date:  06/29/2017  Welcome to the [facility]patient [facility].  Our goal is to provide you with an individualized program based on your diagnosis and situation, designed to meet your specific needs.  With this comprehensive rehabilitation program, you will be expected to participate in at least 3 hours of rehabilitation therapies Monday-Friday, with modified therapy programming on the weekends.  Your rehabilitation program will include the following services:  Physical Therapy (PT), Occupational Therapy (OT), Speech Therapy (ST), 24 hour per day rehabilitation nursing, Case Management (Social Worker), Rehabilitation Medicine, Nutrition Services and Pharmacy Services  Weekly team conferences will be held on Wednesdays to discuss your progress.  Your Social Worker will talk with you frequently to get your input and to update you on team discussions.  Team conferences with you and your family in attendance may also be held.  Expected length of stay:  12 to 18 days  Overall anticipated outcome:  Supervision  Depending on your progress and recovery, your program may change. Your Social Worker will coordinate services and will keep you informed of any changes. Your Social Worker's name and contact numbers are listed  below.  The following services may also be recommended but are not provided by the Inpatient [facility]:   Driving Evaluations  Home Health Rehabiltiation Services  Outpatient Rehabilitation Services   Arrangements will be made to provide these services after discharge if needed.  Arrangements include referral to agencies that provide these services.  Your insurance has been verified to be:  Medicare and [employer] Your primary doctor is: You do not currently have one, but I can help you work on finding someone, as you need a primary care  provider.  Pertinent information will be shared with your doctor and your insurance company.  Social Worker:  Staci Acosta, LCSW  [phone] or ([phone]  Information discussed with and copy given to patient by: Elvera Lennox, 06/29/2017, 12:21 PM

## 2017-06-29 NOTE — H&P (Signed)
Physical Medicine and Rehabilitation Admission H&P     Chief Complaint  Patient presents with  . Facial Droop  . Aphasia  :  HPI: Charles Hatfield is a 77 y.o.right handed male with history of CVA, left CEA, CAD with CABG, COPD with remote tobacco abuse, chronic systolic congestive heart failure,atrial fibrillation/ intracardiac thrombus noncompliant to Coumadin and hypertension.Per chart review patient lives with son. Uses a straight point cane for ambulation and still driving. One level home. Presented 06/25/2017 with right sided weakness and slurred speech. INR on admission of 1.08.UDS negative. Cranial CT unremarkable for acute process. Per report, stable remote infarct left occipital lobe and bilateral parietal lobes.CT angiogram head and neck showed occlusion of the right common carotid artery at its mid aspect age indeterminate. Extensive atheromatous irregularity throughout the left ICA and MCA without large vessel occlusion.3 mm focal outpouching arising at the confluence of the right P1 segment posterior communicating artery suspicious for small aneurysm. Patient did not receive TPA.MRI the brain reviewed, showing right internal capsule CVA. Per report, 2 cm acute ischemic nonhemorrhagic infarct involving the posterior limb of the right internal capsule.Echocardiogram with ejection fraction of 00% grade 1 diastolic dysfunction.Chronic Coumadin ongoing. Initially maintained on a dysphagia 1 Honey thick liquid diet with swallow study 06/28/2016 and diet advanced to dysphagia #2 nectar thick liquids.Physical and occupational therapy evaluations completed 06/27/2016 with recommendations of physical medicine rehabilitation consult. Patient was admitted for a comprehensive rehabilitation program  ROS  Review of Systems  Constitutional: Negative for chills and fever.  HENT: Negative for hearing loss.  Eyes: Negative for blurred vision and double vision.  Respiratory: Positive for shortness of breath.   Cardiovascular: Positive for palpitations and leg swelling.  Gastrointestinal: Positive for constipation. Negative for nausea and vomiting.  Genitourinary: Positive for hematuria. Negative for dysuria.  Musculoskeletal: Positive for myalgias.  Skin: Negative for rash.  Neurological: Positive for weakness. Negative for seizures.  All other systems reviewed and are negative      Past Medical History:  Diagnosis Date  . Atrial fibrillation ([place])    perioperative  . CAD (coronary artery disease)   . Carotid stenosis    bilateral  . COPD (chronic obstructive pulmonary disease) ([place])   . Critical lower limb ischemia   . Diabetes mellitus   . DM (diabetes mellitus) ([place]) 01/15/2012  . Dyslipidemia   . H/O ascending aorta repair   . Hematuria 01/15/2012  . Hypertension   . Ischemic cardiomyopathy   . Left ventricular apical thrombus following MI, NSTEMI, 01/13/12 01/15/2012  . LV (left ventricular) mural thrombus    inferapical  . S/P CABG x 3 01/22/2012   LIMA to LAD,SVG to second diagonal,SVG to distal RCA  . Staghorn renal calculus 01/15/2012  . Stroke ([place])   . Thrombocytopenia ([place])         Past Surgical History:  Procedure Laterality Date  . ABDOMINAL ANGIOGRAM  03/03/2013   Procedure: ABDOMINAL ANGIOGRAM; Surgeon: Lorretta Harp, MD; Location: [facility] CATH LAB; Service: Cardiovascular;;  . CAROTID ENDARTERECTOMY  01/13/2012  . CORONARY ANGIOGRAM  01/16/2012   Procedure: CORONARY ANGIOGRAM; Surgeon: Sanda Klein, MD; Location: [place] CATH LAB; Service: Cardiovascular;;  . CORONARY ARTERY BYPASS GRAFT  01/22/2012   Procedure: CORONARY ARTERY BYPASS GRAFTING (CABG); Surgeon: Ivin Poot, MD; Location: [place]; Service: Open Heart Surgery; Laterality: N/A; CABG x three using left internal mammary artery and right leg greater saphenous vein harvested endoscopically  . ENDARTERECTOMY  01/22/2012   Procedure: ENDARTERECTOMY CAROTID;  Surgeon: Rosetta Posner, MD; Location: [place]; Service:  Vascular; Laterality: Left;  . LOWER EXTREMITY ANGIOGRAM Left 03/03/2013   Procedure: LOWER EXTREMITY ANGIOGRAM; Surgeon: Lorretta Harp, MD; Location: [facility] CATH LAB; Service: Cardiovascular; Laterality: Left;  . NM MYOVIEW LTD  02/20/2012   infarct/scar w/mild perinfarct ischemia, EF 38%, abnormal nuc, no sign. ischemia  . REPAIR THORACIC AORTA     Ascending thoracic aorto repair        Family History  Problem Relation Age of Onset  . Heart disease Father   . Heart attack Brother    Social History: reports that he quit smoking about 5 years ago. His smoking use included cigarettes. He smoked 1.00 pack per day. he has never used smokeless tobacco. He reports that he does not drink alcohol or use drugs.  Allergies: No Known Allergies        Medications Prior to Admission  Medication Sig Dispense Refill  . acetaminophen (TYLENOL) 500 MG tablet Take 1,000 mg by mouth every 6 (six) hours as needed for pain.     Marland Kitchen aspirin EC 81 MG tablet Take 81 mg by mouth daily.    Marland Kitchen albuterol (PROVENTIL HFA;VENTOLIN HFA) 108 (90 BASE) MCG/ACT inhaler Inhale 2 puffs into the lungs every 6 (six) hours as needed for wheezing. (Patient not taking: Reported on 06/11/2017) 1 Inhaler 6  . atorvastatin (LIPITOR) 20 MG tablet TAKE ONE TABLET BY MOUTH ONCE DAILY (Patient not taking: Reported on 06/11/2017) 30 tablet 0  . ipratropium (ATROVENT HFA) 17 MCG/ACT inhaler Inhale 2 puffs into the lungs every 6 (six) hours as needed for wheezing. (Patient not taking: Reported on 06/11/2017) 1 Inhaler 0  . lisinopril (PRINIVIL,ZESTRIL) 5 MG tablet TAKE ONE TABLET BY MOUTH ONCE DAILY (Patient not taking: Reported on 06/11/2017) 30 tablet 0  . NITROSTAT 0.4 MG SL tablet DISSOLVE ONE TABLET UNDER THE TONGUE EVERY 5 MINUTES AS NEEDED FOR CHEST PAIN. DO NOT EXCEED A TOTAL OF 3 DOSES IN 15 MINUTES NOW (Patient not taking: Reported on 06/11/2017) 25 tablet 2  . traMADol (ULTRAM) 50 MG tablet TAKE ONE TABLET BY MOUTH EVERY 6 HOURS AS  NEEDED FOR MODERATE PAIN (Patient not taking: Reported on 02/06/2016) 60 tablet 0   Drug Regimen Review  Drug regimen was reviewed and remains appropriate with no significant issues identified  Home:  Home Living  Family/patient expects to be discharged to:: Private residence  Living Arrangements: Children  Available Help at Discharge: Family, Available PRN/intermittently  Type of Home: House  Home Access: Level entry  Home Layout: One level  Biochemist, clinical: Standard  Home Equipment: Cane - single point  Additional Comments: Information obtained from PT eval  Lives With: Son  Functional History:  Prior Function  Level of Independence: Independent  Comments: Reports driving and using SPC for ambulaton PTA as needed. Lives with son.  Functional Status:  Mobility:  Bed Mobility  Overal bed mobility: Needs Assistance  Bed Mobility: Supine to Sit, Sit to Supine  Rolling: Min guard  Sidelying to sit: Mod assist, HOB elevated  Supine to sit: Mod assist, HOB elevated  Sit to supine: Mod assist, HOB elevated  General bed mobility comments: Sitting EOB upon PT arrival.  Transfers  Overall transfer level: Needs assistance  Equipment used: 2 person hand held assist  Transfers: Sit to/from Stand  Sit to Stand: Min assist, +2 physical assistance  General transfer comment: Cues for hand placement and technique. Min assist +2 for sit to stand from EOB  x1 and chair x1.  Ambulation/Gait  Ambulation/Gait assistance: +2 physical assistance  Ambulation Distance (Feet): 50 Feet  Assistive device: 2 person hand held assist  Gait Pattern/deviations: Step-to pattern, Narrow base of support, Decreased stance time - right, Decreased weight shift to right  General Gait Details: Cues to widen BoS; increased WB through [place]. Some knee instability noted LLE. Pt veering left and then right about to run into doorway and IV pole in front of him. Visual deficits bilaterally noted.  Gait velocity: decreased   Gait velocity interpretation: <1.8 ft/sec, indicative of risk for recurrent falls  ADL:  ADL  Overall ADL's : Needs assistance/impaired  Grooming: Moderate assistance, Standing, Wash/dry hands  Grooming Details (indicate cue type and reason): Assist for balance and for identifying ADL items for task  Upper Body Bathing: Moderate assistance, Sitting  Lower Body Bathing: Maximal assistance, Sit to/from stand  Upper Body Dressing : Minimal assistance, Sitting  Upper Body Dressing Details (indicate cue type and reason): to doff/don new gown  Lower Body Dressing: Maximal assistance, Sit to/from stand  Toilet Transfer: Moderate assistance, +2 for physical assistance, +2 for safety/equipment, Ambulation(HHA)  Toilet Transfer Details (indicate cue type and reason): Simulated by functional mobility  Functional mobility during ADLs: Moderate assistance, +2 for physical assistance, +2 for safety/equipment(HHA)  Cognition:  Cognition  Overall Cognitive Status: Difficult to assess  Arousal/Alertness: Awake/alert  Orientation Level: Oriented to person, Oriented to place, Disoriented to time, Oriented to situation  Attention: Focused, Sustained  Focused Attention: Appears intact  Sustained Attention: Appears intact  Memory: Impaired  Memory Impairment: Decreased short term memory  Decreased Short Term Memory: Verbal basic  Problem Solving: Impaired  Problem Solving Impairment: Verbal basic  Cognition  Arousal/Alertness: Awake/alert  Behavior During Therapy: WFL for tasks assessed/performed  Overall Cognitive Status: Difficult to assess  General Comments: Pt following one step commands consistenlty. Difficult to understand for formal cognitive testing. A&Ox4 but does not know date. Jokes appropriately.  Difficult to assess due to: Impaired communication  Physical Exam:  Blood pressure (!) 105/56, pulse 66, temperature (!) 97.5 F (36.4 C), temperature source Axillary, resp. rate 17, weight 57.1  kg (125 lb 14.1 oz), SpO2 93 %.  Physical Exam  Constitutional:  Frail white male.  HENT:  Head: Normocephalic.  Eyes:  Pupils sluggish but reactive to light  Neck: Normal range of motion. Neck supple. No thyromegaly present.  Cardiovascular:  Cardiac rate controlled  Respiratory:  Limited inspiratory effort but clear to auscultation  GI: Soft. Bowel sounds are normal. He exhibits no distension.  Skin. Warm and dry  Musculoskeletal. No edema or tenderness  Neurological:  Severe dysarthria.  Expressive asphasia >> Receptive aphasia Follows simple commands at times. Left inattention Nonverbal Motor, limited by participation: RLE: 2+/5 HF, KE, 4/5 ADF/PF LLE: HF, KE, ADF/PF 4-/5, ADF/PF 4+/5 Bl UE: ?4+/5 proximal to distal  Sensation  Lab Results Last 48 Hours        Results for orders placed or performed during the hospital encounter of 06/25/17 (from the past 48 hour(s))  Protime-INR Status: Abnormal   Collection Time: 06/27/17 5:32 AM  Result Value Ref Range   Prothrombin Time 16.7 (H) 11.4 - 15.2 seconds   INR 1.37   Protime-INR Status: Abnormal   Collection Time: 06/28/17 8:11 AM  Result Value Ref Range   Prothrombin Time 21.4 (H) 11.4 - 15.2 seconds   INR 9.73   Basic metabolic panel Status: Abnormal   Collection Time: 06/28/17 8:11 AM  Result Value Ref Range   Sodium 138 135 - 145 mmol/L   Potassium 4.1 3.5 - 5.1 mmol/L   Chloride 106 101 - 111 mmol/L   CO2 26 22 - 32 mmol/L   Glucose, Bld 87 65 - 99 mg/dL   BUN 18 6 - 20 mg/dL   Creatinine, Ser 1.21 0.61 - 1.24 mg/dL   Calcium 8.6 (L) 8.9 - 10.3 mg/dL   GFR calc non Af Amer 56 (L) >60 mL/min   GFR calc Af Amer >60 >60 mL/min    Comment: (NOTE)  The eGFR has been calculated using the CKD EPI equation.  This calculation has not been validated in all clinical situations.  eGFR's persistently <60 mL/min signify possible Chronic Kidney  Disease.    Anion gap 6 5 - 15  Glucose, capillary Status: None    Collection Time: 06/28/17 11:20 AM  Result Value Ref Range   Glucose-Capillary 91 65 - 99 mg/dL   Comment 1 Notify RN    Comment 2 Document in Chart     Imaging Results (Last 48 hours)  Mr Brain Wo Contrast  Result Date: 06/27/2017  CLINICAL DATA: Initial evaluation for acute slurred speech with right facial droop, TIA. EXAM: MRI HEAD WITHOUT CONTRAST TECHNIQUE: Multiplanar, multiecho pulse sequences of the brain and surrounding structures were obtained without intravenous contrast. COMPARISON: Prior CT and CTA from 06/25/2017. FINDINGS: Brain: Moderately advanced cerebral atrophy. Patchy and confluent T2/FLAIR hyperintensity throughout the periventricular and deep white matter both cerebral hemispheres, consistent with chronic microvascular ischemic changes. Multiple remote lacunar infarcts seen involving the bilateral basal ganglia and right thalamus. Additional remote small vessel infarct present at the left paramedian pons. Remote bilateral PCA territory infarcts. Associated laminar necrosis noted about the chronic right PCA infarct. Chronic hemosiderin staining seen about several of these infarcts, consistent with chronic blood products. Bifrontal encephalomalacia suspected to be related to remote trauma, although sequelae of prior ischemia could also be considered. 2 cm focus of restricted diffusion seen involving the posterior limb of the right internal capsule, consistent with acute ischemic infarct (series 4, image 25). No associated hemorrhage or mass effect. No other evidence for acute or subacute ischemia. No mass lesion, midline shift, or mass effect. No hydrocephalus. No extra-axial fluid collection. Major dural sinuses are grossly patent. Pituitary gland suprasellar region normal. Midline structures intact. Vascular: Abnormal flow void within the right ICA to the level of the terminus, consistent with previous identified occlusion. Hypoplastic right vertebral artery poorly visualized. Major  intracranial vascular flow voids otherwise maintained. Skull and upper cervical spine: Craniocervical junction normal. Upper cervical spine grossly within normal limits. Bone marrow signal intensity somewhat mottled in appearance without discrete osseous lesion. No scalp soft tissue abnormality. Sinuses/Orbits: Globes and orbital soft tissues within normal limits. Remote defect noted at the left lamina for pre shaft. Paranasal sinuses are largely clear. Trace bilateral mastoid effusions, of doubtful significance. Inner ear structures normal. Other: None. IMPRESSION: 1. 2 cm acute ischemic nonhemorrhagic infarct involving the posterior limb of the right internal capsule. 2. Multiple remote infarcts involving the bilateral basal ganglia, right thalamus, and left paramedian pons, with additional remote bilateral PCA territory infarcts. Additional areas of bifrontal encephalomalacia favored to be posttraumatic in nature given location and morphology. 3. 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 4. Moderate cerebral atrophy with chronic microvascular ischemic disease. Electronically Signed By: Jeannine Boga M.D. On: 06/27/2017 00:32  Dg Chest Port 1 View  Result Date:  06/27/2017  CLINICAL DATA: Shortness of breath. Dyspnea. EXAM: PORTABLE CHEST 1 VIEW COMPARISON: 06/26/2017 and 06/11/2017 FINDINGS: The heart size and mediastinal contours are within normal limits. Both lungs are clear. The visualized skeletal structures are unremarkable. CABG. IMPRESSION: No active disease. Electronically Signed By: Lorriane Shire M.D. On: 06/27/2017 11:50  Dg Swallowing Func-speech Pathology  Result Date: 06/28/2017  Objective Swallowing Evaluation: Type of Study: MBS-Modified Barium Swallow Study Patient Details Name: MAURY GRONINGER MRN: [id number] Date of Birth: 1940/11/07 Today's Date: 06/28/2017 Time: SLP Start Time (ACUTE ONLY): 0945 -SLP Stop Time (ACUTE ONLY): 1010 SLP Time Calculation (min)  (ACUTE ONLY): 25 min Past Medical History: Past Medical History: Diagnosis Date . Atrial fibrillation ([place]) perioperative . CAD (coronary artery disease) . Carotid stenosis bilateral . COPD (chronic obstructive pulmonary disease) ([place]) . Critical lower limb ischemia . Diabetes mellitus . DM (diabetes mellitus) ([place]) 01/15/2012 . Dyslipidemia . H/O ascending aorta repair . Hematuria 01/15/2012 . Hypertension . Ischemic cardiomyopathy . Left ventricular apical thrombus following MI, NSTEMI, 01/13/12 01/15/2012 . LV (left ventricular) mural thrombus inferapical . S/P CABG x 3 01/22/2012 LIMA to LAD,SVG to second diagonal,SVG to distal RCA . Staghorn renal calculus 01/15/2012 . Stroke ([place]) . Thrombocytopenia ([place]) Past Surgical History: Past Surgical History: Procedure Laterality Date . ABDOMINAL ANGIOGRAM 03/03/2013 Procedure: ABDOMINAL ANGIOGRAM; Surgeon: Lorretta Harp, MD; Location: [facility] CATH LAB; Service: Cardiovascular;; . CAROTID ENDARTERECTOMY 01/13/2012 . CORONARY ANGIOGRAM 01/16/2012 Procedure: CORONARY ANGIOGRAM; Surgeon: Sanda Klein, MD; Location: [place] CATH LAB; Service: Cardiovascular;; . CORONARY ARTERY BYPASS GRAFT 01/22/2012 Procedure: CORONARY ARTERY BYPASS GRAFTING (CABG); Surgeon: Ivin Poot, MD; Location: [place]; Service: Open Heart Surgery; Laterality: N/A; CABG x three using left internal mammary artery and right leg greater saphenous vein harvested endoscopically . ENDARTERECTOMY 01/22/2012 Procedure: ENDARTERECTOMY CAROTID; Surgeon: Rosetta Posner, MD; Location: [place]; Service: Vascular; Laterality: Left; . LOWER EXTREMITY ANGIOGRAM Left 03/03/2013 Procedure: LOWER EXTREMITY ANGIOGRAM; Surgeon: Lorretta Harp, MD; Location: [facility] CATH LAB; Service: Cardiovascular; Laterality: Left; . NM MYOVIEW LTD 02/20/2012 infarct/scar w/mild perinfarct ischemia, EF 38%, abnormal nuc, no sign. ischemia . REPAIR THORACIC AORTA Ascending thoracic aorto repair HPI: Patient is a 77 y.o. male with PMH: severe PAD, prior CVA,  CAD s/p CABG, HTN, L CEA who presented to the ED with son, who reported patient with new onset slurred speech and right facial droop. Head CT did not reveal any acute abnormality, with stable remote infarcts involving left occipital lobe, bilateral parietal lobes, lacunar infarcts of basal ganglia. Per MD note pt is living in a motel with his son, does not have adequate resources for pt to take medication regularly. Subjective: extremely dysarthric and very difficult to understand Assessment / Plan / Recommendation CHL IP CLINICAL IMPRESSIONS 06/28/2017 Clinical Impression Pt demonstrates a pirmary oral dysphagia that impacts pharyngeal function. Pt has significantly right labial and buccal weakness with decreased ability to seal lips and produce adequate intraoral pressures. There is occasional anterior spillage and pooling in the right anterior/buccal cavity. There is piecemeal oral propulsion of bolus with delayed swallow initiation. Pt requries moderate sized bolus to initaite effective swallow. Thin liquids pooling the pharynx prior to swallow are aspirated with hard sensation and ejected. Nectar is tolerated as long as bolus is not overlarge. Pt was able to take moderate cup and straw sips with verbal cues, but is aspiration does occur it is immediately ejected with hard cough. Mild pharyngeal wall weakness, likely on the right side, results in unilateral mild residuals with solids. Will Recommend  a dys 2 (fine chopped diet) with nectar thick liquids with interventions to target pharyngeal strength, and oral compensatory strategies. SLP Visit Diagnosis Dysphagia, oropharyngeal phase (R13.12);Dysphagia, oral phase (R13.11) Attention and concentration deficit following -- Frontal lobe and executive function deficit following -- Impact on safety and function Moderate aspiration risk CHL IP TREATMENT RECOMMENDATION 06/28/2017 Treatment Recommendations Therapy as outlined in treatment plan below Prognosis 06/28/2017  Prognosis for Safe Diet Advancement Good Barriers to Reach Goals -- Barriers/Prognosis Comment -- CHL IP DIET RECOMMENDATION 06/28/2017 SLP Diet Recommendations Dysphagia 2 (Fine chop) solids;Nectar thick liquid Liquid Administration via Cup;Straw Medication Administration -- Compensations Slow rate;Small sips/bites;Multiple dry swallows after each bite/sip;Monitor for anterior loss Postural Changes Seated upright at 90 degrees CHL IP OTHER RECOMMENDATIONS 06/28/2017 Recommended Consults -- Oral Care Recommendations -- Other Recommendations Order thickener from pharmacy;Clarify dietary restrictions;Prohibited food (jello, ice cream, thin soups) CHL IP FOLLOW UP RECOMMENDATIONS 06/28/2017 Follow up Recommendations Inpatient Rehab CHL IP FREQUENCY AND DURATION 06/28/2017 Speech Therapy Frequency (ACUTE ONLY) min 2x/week Treatment Duration 2 weeks CHL IP ORAL PHASE 06/28/2017 Oral Phase Impaired Oral - Pudding Teaspoon -- Oral - Pudding Cup -- Oral - Honey Teaspoon -- Oral - Honey Cup -- Oral - Nectar Teaspoon -- Oral - Nectar Cup Pocketing in anterior sulcus;Right pocketing in lateral sulci;Right anterior bolus loss;Decreased bolus cohesion;Premature spillage Oral - Nectar Straw Pocketing in anterior sulcus;Right pocketing in lateral sulci;Right anterior bolus loss;Decreased bolus cohesion;Premature spillage Oral - Thin Teaspoon -- Oral - Thin Cup Pocketing in anterior sulcus;Right pocketing in lateral sulci;Right anterior bolus loss;Decreased bolus cohesion;Premature spillage Oral - Thin Straw Pocketing in anterior sulcus;Right pocketing in lateral sulci;Right anterior bolus loss;Decreased bolus cohesion;Premature spillage Oral - Puree Pocketing in anterior sulcus;Right pocketing in lateral sulci;Right anterior bolus loss;Decreased bolus cohesion;Premature spillage Oral - Mech Soft Pocketing in anterior sulcus;Right pocketing in lateral sulci;Right anterior bolus loss;Decreased bolus cohesion;Premature spillage Oral - Regular  -- Oral - Multi-Consistency -- Oral - Pill -- Oral Phase - Comment -- CHL IP PHARYNGEAL PHASE 06/28/2017 Pharyngeal Phase Impaired Pharyngeal- Pudding Teaspoon -- Pharyngeal -- Pharyngeal- Pudding Cup -- Pharyngeal -- Pharyngeal- Honey Teaspoon -- Pharyngeal -- Pharyngeal- Honey Cup -- Pharyngeal -- Pharyngeal- Nectar Teaspoon -- Pharyngeal -- Pharyngeal- Nectar Cup Delayed swallow initiation-pyriform sinuses;Penetration/Aspiration before swallow;Penetration/Aspiration during swallow Pharyngeal Material enters airway, passes BELOW cords then ejected out;Material does not enter airway Pharyngeal- Nectar Straw Delayed swallow initiation-pyriform sinuses;Penetration/Aspiration during swallow;Penetration/Aspiration before swallow;Trace aspiration Pharyngeal Material enters airway, passes BELOW cords then ejected out;Material does not enter airway Pharyngeal- Thin Teaspoon -- Pharyngeal -- Pharyngeal- Thin Cup Delayed swallow initiation-pyriform sinuses;Penetration/Aspiration during swallow;Penetration/Aspiration before swallow;Trace aspiration Pharyngeal Material enters airway, passes BELOW cords then ejected out;Material does not enter airway Pharyngeal- Thin Straw Delayed swallow initiation-pyriform sinuses;Penetration/Aspiration during swallow;Penetration/Aspiration before swallow;Trace aspiration Pharyngeal Material enters airway, passes BELOW cords then ejected out;Material does not enter airway Pharyngeal- Puree Reduced pharyngeal peristalsis;Pharyngeal residue - valleculae;Pharyngeal residue - pyriform;Lateral channel residue;Compensatory strategies attempted (with notebox) Pharyngeal -- Pharyngeal- Mechanical Soft Reduced pharyngeal peristalsis;Pharyngeal residue - valleculae;Pharyngeal residue - pyriform;Lateral channel residue Pharyngeal -- Pharyngeal- Regular -- Pharyngeal -- Pharyngeal- Multi-consistency -- Pharyngeal -- Pharyngeal- Pill -- Pharyngeal -- Pharyngeal Comment -- No flowsheet data found. No  flowsheet data found. Herbie [place], [place] [phone] Lynann Beaver 06/28/2017, 12:14 PM    Medical Problem List and Plan:  1. Right side weakness secondary to previous left pontine infarct with dysarthria secondary to acute ischemic nonhemorrhagic infarct involving the posterior limb right internal capsule  2. DVT Prophylaxis/Anticoagulation: Chronic  Coumadin. Monitor for any bleeding episodes  3. Pain Management: Tylenol as needed  4. Mood: Provide emotional support  5. Neuropsych: This patient is capable of making decisions on his own behalf.  6. Skin/Wound Care: Routine skin checks  7. Fluids/Electrolytes/Nutrition: Routine I&O's with follow-up chemistries  8. Dysphagia. Dysphagia #2 nectar liquids. Monitor hydration with follow-up speech therapy  9. Atrial fibrillation. Cardiac rate controlled. Continue Coumadin  10. CAD/left ventricular apical thrombus/NSTEMI with CABG 2013. No chest pain noted. Continue aspirin  11. Hypertension. Monitor with increased mobility  12. COPD with remote tobacco abuse. Continue albuterol treatments as needed  13. Chronic systolic congestive heart failure. Monitor for any signs of fluid overload  14. Hyperlipidemia. Lipitor  15. Vascular dementia secondary to multi-infarcts small vessel disease  Post Admission Physician Evaluation:  1. Functional deficits secondary to new right posterior limb internal capsule infarct with previous left pontine infarcts and scattered cortical and subcortical white matter infarcts bilaterally. 2. Patient is admitted to receive collaborative, interdisciplinary care between the physiatrist, rehab nursing staff, and therapy team. 3. Patient's level of medical complexity and substantial therapy needs in context of that medical necessity cannot be provided at a lesser intensity of care such as a SNF. 4. Patient has experienced substantial functional loss from his/her baseline which was documented above under the  "Functional History" and "Functional Status" headings. Judging by the patient's diagnosis, physical exam, and functional history, the patient has potential for functional progress which will result in measurable gains while on inpatient rehab. These gains will be of substantial and practical use upon discharge in facilitating mobility and self-care at the household level. 5. Physiatrist will provide 24 hour management of medical needs as well as oversight of the therapy plan/treatment and provide guidance as appropriate regarding the interaction of the two. 6. The Preadmission Screening has been reviewed and patient status is unchanged unless otherwise stated above. 7. 24 hour rehab nursing will assist with bladder management, bowel management, safety, skin/wound care, disease management, medication administration, pain management and patient education and help integrate therapy concepts, techniques,education, etc. 8. PT will assess and treat for/with: pre gait, gait training, endurance , safety, equipment, neuromuscular re education. Goals are: Min/Sup. 9. OT will assess and treat for/with: ADLs, Cognitive perceptual skills, Neuromuscular re education, safety, endurance, equipment. Goals are: Min/Sup. Therapy may proceed with showering this patient. 10. SLP will assess and treat for/with: Evaluate cognition and swallowing. Goals are: Safe and adequate p.o. intake with normal diet, advised on memory strategies. 11. Case Management and Social Worker will assess and treat for psychological issues and discharge planning. 12. Team conference will be held weekly to assess progress toward goals and to determine barriers to discharge. 13. Patient will receive at least 3 hours of therapy per day at least 5 days per week. 14. ELOS: 14-17d  15. Prognosis: fair Charlett Blake M.D.  [facility]  FAAPM&R (Sports Med, Neuromuscular Med)  Diplomate Am Board of Electrodiagnostic Med  Cathlyn Parsons, PA-C  06/28/2017

## 2017-06-29 NOTE — Progress Notes (Signed)
Physical Therapy Session Note  Patient Details  Name: Charles Hatfield MRN: [id number] Date of Birth: Oct 15, 1940  Today's Date: 06/29/2017 PT Individual Time: 1430-1500 PT Individual Time Calculation (min): 30 min   Short Term Goals: Week 1:  PT Short Term Goal 1 (Week 1): Pt will ambulate 30' with LRAD and mod assist PT Short Term Goal 2 (Week 1): Pt will negotiate 12 steps with min assist PT Short Term Goal 3 (Week 1): Pt will complete BERG Balance Scale  Skilled Therapeutic Interventions/Progress Updates:    no c/o pain.  Session focus on gait with RW.  Pt transfers supine<>sit with supervision and increased time.  Stand/pivot throughout session with RW and min assist.  Gait 2x30' with RW and min assist with rest break between trials, cues for walker positioning and advancing BLEs.  Pt returned to room at end of session and positioned in bed with call bell in reach and needs met.   Therapy Documentation Precautions:  Precautions Precautions: Fall Precaution Comments: profound visual deficits Restrictions Weight Bearing Restrictions: No   See Function Navigator for Current Functional Status.   Therapy/Group: Individual Therapy  Michel Santee 06/29/2017, 5:00 PM

## 2017-06-29 NOTE — Progress Notes (Signed)
PMR Admission Coordinator Pre-Admission Assessment  Patient: Charles Hatfield is an 77 y.o., male MRN: [id number] DOB: June 24, 1941 Height:   Weight: 57.1 kg (125 lb 14.1 oz)                                                                                                                                                  Insurance Information HMO:     PPO:      PCP:      IPA:      80/20:      OTHER:  PRIMARY: Medicare A & B      Policy#: [id number]      Subscriber: Self CM Name:       Phone#:      Fax#:  Pre-Cert#: Eligible       Employer: Retired Benefits:  Phone #: Verified online      Name: [employer]  Eff. Date: 04/26/06     Deduct: $1364      Out of Pocket Max: N/A      Life Max: N/A CIR: 100%      SNF: 100% days 1-20; 80% days 21-100 Outpatient: 80%     Co-Pay: 20% Home Health: 100%      Co-Pay: none DME: 80%     Co-Pay: 20% Providers: Patient's choice   SECONDARY: BCBS Federal [employer]. PPO      Policy#: [id number]      Subscriber: Self CM Name:       Phone#:      Fax#:  Pre-Cert#:       Employer: Retired  Benefits:  Phone #: [phone]     Name:  Eff. Date:      Deduct:       Out of Pocket Max:       Life Max:  CIR:       SNF:  Outpatient:      Co-Pay:  Home Health:       Co-Pay:  DME:      Co-Pay:   Medicaid Application Date:       Case Manager:  Disability Application Date:       Case Worker:   Emergency Contact Information        Contact Information    Name Relation Home Work [place] Son [phone]  [phone]   No name specified         Current Medical History  Patient Admitting Diagnosis: Acute ischemic nonhemorrhagic infarct involving the posterior limb of the right internal capsule  History of Present Illness: Charles Hatfield a 77 y.o.right handedmalewith history of CVA, left CEA, CAD with CABG,COPD with remote tobacco abuse, chronic systolic congestive heart failure,atrial fibrillation/ intracardiac thrombus  noncompliant to Coumadin and hypertension.Per chart review patient lives with son. Uses a straight point cane for ambulation and still driving. One level  home. Presented 06/25/2017 with right sided weakness and slurred speech. INR on admission of 1.08.UDS negative.Cranial CTunremarkable for acute process. Per report, stable remote infarct left occipital lobe and bilateral parietal lobes.CT angiogram head and neck showed occlusion of the right common carotid artery at its mid aspect age indeterminate. Extensive atheromatous irregularity throughout the left ICA and MCA without large vessel occlusion.3 mm focal outpouching arising at the confluence of the right P1 segment posterior communicating artery suspicious for small aneurysm. Patient did not receive TPA.MRI the brainreviewed, showing right internal capsule CVA. Per report,2 cm acute ischemic nonhemorrhagic infarct involving the posterior limb of the right internal capsule.Echocardiogramwith ejection fraction of 45% grade 1 diastolic dysfunction.Chronic Coumadin ongoing.Initiallymaintained on a dysphagia 1 Honey thick liquid dietwith swallow study 06/28/2016 and diet advanced to dysphagia #2 nectar thick liquids.Physicaland occupationaltherapy evaluationscompleted 06/27/2016 with recommendations of physical medicine rehabilitation consult.Patient was admitted for acomprehensive rehabilitation program 06/28/17.   NIH Total: 8  Past Medical History      Past Medical History:  Diagnosis Date  . Atrial fibrillation (HCC)    perioperative  . CAD (coronary artery disease)   . Carotid stenosis    bilateral  . COPD (chronic obstructive pulmonary disease) (HCC)   . Critical lower limb ischemia   . Diabetes mellitus   . DM (diabetes mellitus) (HCC) 01/15/2012  . Dyslipidemia   . H/O ascending aorta repair   . Hematuria 01/15/2012  . Hypertension   . Ischemic cardiomyopathy   . Left ventricular apical thrombus following MI,  NSTEMI, 01/13/12 01/15/2012  . LV (left ventricular) mural thrombus    inferapical  . S/P CABG x 3 01/22/2012   LIMA to LAD,SVG to second diagonal,SVG to distal RCA  . Staghorn renal calculus 01/15/2012  . Stroke (HCC)   . Thrombocytopenia (HCC)     Family History  family history includes Heart attack in his brother; Heart disease in his father.  Prior Rehab/Hospitalizations:  Has the patient had major surgery during 100 days prior to admission? No  Current Medications   Current Facility-Administered Medications:  .  acetaminophen (TYLENOL) tablet 650 mg, 650 mg, Oral, Q4H PRN **OR** acetaminophen (TYLENOL) solution 650 mg, 650 mg, Per Tube, Q4H PRN **OR** acetaminophen (TYLENOL) suppository 650 mg, 650 mg, Rectal, Q4H PRN, Delano Metz, MD .  albuterol (PROVENTIL) (2.5 MG/3ML) 0.083% nebulizer solution 3 mL, 3 mL, Inhalation, Q6H PRN, Delano Metz, MD .  aspirin EC tablet 81 mg, 81 mg, Oral, Daily, Delano Metz, MD, 81 mg at 06/28/17 1137 .  atorvastatin (LIPITOR) tablet 80 mg, 80 mg, Oral, q1800, Tyrone Nine, MD, 80 mg at 06/27/17 1706 .  RESOURCE THICKENUP CLEAR, , Oral, PRN, Tyrone Nine, MD .  senna-docusate (Senokot-S) tablet 1 tablet, 1 tablet, Oral, QHS PRN, Delano Metz, MD .  warfarin (COUMADIN) tablet 2.5 mg, 2.5 mg, Oral, ONCE-1800, Almon Hercules, [place] .  Warfarin - Pharmacist Dosing Inpatient, , Does not apply, q1800, [place], Deirdre Evener, RPH  Patients Current Diet: DIET DYS 2 Room service appropriate? Yes; Fluid consistency: Nectar Thick  Precautions / Restrictions Precautions Precautions: Fall Precaution Comments: increased secretions with difficulty managing them Restrictions Weight Bearing Restrictions: No   Has the patient had 2 or more falls or a fall with injury in the past year?Yes  Prior Activity Level Limited Community (1-2x/wk): Prior to admission patient resided with son who, was with him most of the time and assisted his dad as  needed.  Patient did not go out alot and  was resistant to medical care and medications.  Patient enjoys his cats.    Home Assistive Devices / Equipment Home Assistive Devices/Equipment: Environmental consultant (specify type), Wheelchair, Bedside commode/3-in-1, Cane (specify quad or straight), Shower chair with back(urinal, depends, walker with wheels and seat) Home Equipment: Cane - single point  Prior Device Use: Indicate devices/aids used by the patient prior to current illness, exacerbation or injury? Patient had devices but liked to furniture walk   Prior Functional Level Prior Function Level of Independence: Independent Comments: Reports driving and using SPC for ambulaton PTA as needed. Lives with son.  Self Care: Did the patient need help bathing, dressing, using the toilet or eating? Needed some help  Indoor Mobility: Did the patient need assistance with walking from room to room (with or without device)? Independent  Stairs: Did the patient need assistance with internal or external stairs (with or without device)? Needed some help  Functional Cognition: Did the patient need help planning regular tasks such as shopping or remembering to take medications? Needed some help  Current Functional Level Cognition  Arousal/Alertness: Awake/alert Overall Cognitive Status: Difficult to assess Difficult to assess due to: Impaired communication Orientation Level: Oriented to person, Oriented to place, Disoriented to time, Oriented to situation General Comments: Pt following one step commands consistenlty. Difficult to understand for formal cognitive testing. A&Ox4 but does not know date. Jokes appropriately.  Attention: Focused, Sustained Focused Attention: Appears intact Sustained Attention: Appears intact Memory: Impaired Memory Impairment: Decreased short term memory Decreased Short Term Memory: Verbal basic Problem Solving: Impaired Problem Solving Impairment: Verbal basic    Extremity  Assessment (includes Sensation/Coordination)  Upper Extremity Assessment: Generalized weakness RUE Sensation: (WFL)  Lower Extremity Assessment: Defer to PT evaluation RLE Deficits / Details: Grossly ~2+/5 ankle DF, knee extension/flexion, hip flexion. RLE Sensation: [facility]) RLE Coordination: decreased fine motor, decreased gross motor    ADLs  Overall ADL's : Needs assistance/impaired Grooming: Moderate assistance, Standing, Wash/dry hands Grooming Details (indicate cue type and reason): Assist for balance and for identifying ADL items for task Upper Body Bathing: Moderate assistance, Sitting Lower Body Bathing: Maximal assistance, Sit to/from stand Upper Body Dressing : Minimal assistance, Sitting Upper Body Dressing Details (indicate cue type and reason): to doff/don new gown Lower Body Dressing: Maximal assistance, Sit to/from stand Toilet Transfer: Moderate assistance, +2 for physical assistance, +2 for safety/equipment, Ambulation(HHA) Toilet Transfer Details (indicate cue type and reason): Simulated by functional mobility Functional mobility during ADLs: Moderate assistance, +2 for physical assistance, +2 for safety/equipment(HHA)    Mobility  Overal bed mobility: Needs Assistance Bed Mobility: Supine to Sit, Sit to Supine Rolling: Min guard Sidelying to sit: Mod assist, HOB elevated Supine to sit: Mod assist, HOB elevated Sit to supine: Mod assist, HOB elevated General bed mobility comments: Sitting EOB upon PT arrival.     Transfers  Overall transfer level: Needs assistance Equipment used: 2 person hand held assist Transfers: Sit to/from Stand Sit to Stand: Min assist, +2 physical assistance General transfer comment: Cues for hand placement and technique. Min assist +2 for sit to stand from EOB x1 and chair x1.    Ambulation / Gait / Stairs / Wheelchair Mobility  Ambulation/Gait Ambulation/Gait assistance: +2 physical assistance Ambulation Distance (Feet): 50  Feet Assistive device: 2 person hand held assist Gait Pattern/deviations: Step-to pattern, Narrow base of support, Decreased stance time - right, Decreased weight shift to right General Gait Details: Cues to widen BoS; increased WB through BUEs. Some knee instability noted LLE. Pt veering  left and then right about to run into doorway and IV pole in front of him. Visual deficits bilaterally noted.  Gait velocity: decreased Gait velocity interpretation: <1.8 ft/sec, indicative of risk for recurrent falls    Posture / Balance Dynamic Sitting Balance Sitting balance - Comments: Min guard throughout. Able to scoot self to EOB without LOB. Balance Overall balance assessment: Needs assistance Sitting-balance support: Feet supported, No upper extremity supported Sitting balance-Leahy Scale: Fair Sitting balance - Comments: Min guard throughout. Able to scoot self to EOB without LOB. Standing balance support: No upper extremity supported, During functional activity Standing balance-Leahy Scale: Poor Standing balance comment: Min assist for static standing without UE support. Able to stand at sink and wash hands with cues due to visual deficits. Difficulty turning on faucet and finding paper towels without cues.     Special needs/care consideration BiPAP/CPAP: No CPM: No Continuous Drip IV: No Dialysis: No         Life Vest: No Oxygen: No Special Bed: No Trach Size: No Wound Vac (area): No       Skin: Dry, Abrasion and bruising to left knee and bilateral arms                                Bowel mgmt: PTA 06/25/17 Bladder mgmt: External catheter, amber and malodorous per acute flow chart  Diabetic mgmt: HGBA1c 4.9, goal <7.0      Previous Home Environment Living Arrangements: Children  Lives With: Son Available Help at Discharge: Family, Available PRN/intermittently Type of Home: House Home Layout: One level Home Access: Level entry Firefighter: Standard Home Care Services:  No Additional Comments: Information obtained from PT eval  Discharge Living Setting Plans for Discharge Living Setting: Other (Comment)(Motel/extended stay) Type of Home at Discharge: Other (Comment)([employer]el/extended stay, son is working on [employer] housing) Discharge Home Layout: One level Discharge Home Access: Level entry Discharge Bathroom Shower/Tub: Tub/shower unit Discharge Bathroom Toilet: Standard Discharge Bathroom Accessibility: Yes How Accessible: Accessible via walker Does the patient have any problems obtaining your medications?: No  Social/Family/Support Systems Patient Roles: Parent Contact Information: Son: Casimiro Needle  Anticipated Caregiver: Son  Anticipated Caregiver's Contact Information: see above  Ability/Limitations of Caregiver: Son works 3-4 hours at night when his dad is asleep  Caregiver Availability: 24/7(short term, requesting assist ) Discharge Plan Discussed with Primary Caregiver: Yes Is Caregiver In Agreement with Plan?: Yes Does Caregiver/Family have Issues with Lodging/Transportation while Pt is in Rehab?: Yes  Goals/Additional Needs Patient/Family Goal for Rehab: PT/OT/SLP: Supervision-Min A Expected length of stay: 14-17 days  Cultural Considerations: Catholic  Dietary Needs: Dys.2 textures and nectar-thick liquids  Equipment Needs: TBD Special Service Needs: Son requesting assist with getting assistance for his dad, does he qualify for disability or caregiver some? Additional Information: see above Pt/Family Agrees to Admission and willing to participate: Yes Program Orientation Provided & Reviewed with Pt/Caregiver Including Roles  & Responsibilities: Yes Additional Information Needs: see above  Information Needs to be Provided By: CSW  Barriers to Discharge: Lack of/limited family support, Medication compliance, Other (comments)(Housing, ? Qualify for disability, or caregiver  )  Decrease burden of Care through IP rehab admission:  No  Possible need for SNF placement upon discharge: Not anticipated   Patient Condition: This patient's condition remains as documented in the consult dated 06/27/17, in which the Rehabilitation Physician determined and documented that the patient's condition is appropriate for intensive rehabilitative care in  an inpatient rehabilitation facility. Will admit to inpatient rehab today.  Preadmission Screen Completed By:  Fae Pippin, 06/28/2017 3:43 PM ______________________________________________________________________   Discussed status with Dr. Wynn Banker on 06/28/17 at 1553 and received telephone approval for admission today.  Admission Coordinator:  Fae Pippin, time 5621 /Date 06/28/17             Revision History

## 2017-06-29 NOTE — Evaluation (Signed)
Physical Therapy Assessment and Plan  Patient Details  Name: Charles Hatfield MRN: [id number] Date of Birth: 1940/11/20  PT Diagnosis: Abnormality of gait, Coordination disorder and Muscle weakness Rehab Potential: Fair ELOS: 12-14   Today's Date: 06/29/2017 PT Individual Time: 0900-1000 PT Individual Time Calculation (min): 60 min    Problem List:  Patient Active Problem List   Diagnosis Date Noted  . Embolic cerebral infarction ([place]) 06/28/2017  . Hemiparesis affecting right side as late effect of cerebrovascular accident (CVA) ([place])   . Residual cognitive deficit as late effect of stroke   . Dysphagia as late effect of stroke   . Chronic combined systolic and diastolic CHF (congestive heart failure) ([place])   . Benign essential HTN   . PAF (paroxysmal atrial fibrillation) ([place])   . Intracardiac thrombosis   . Tachypnea   . Right hemiparesis ([place]) 06/25/2017  . Dysarthria 06/25/2017  . Dysphagia 06/25/2017  . CVA (cerebral vascular accident) ([place]) 06/25/2017  . Long term (current) use of anticoagulants 06/25/2017  . Mixed hyperlipidemia 06/15/2013  . CKD (chronic kidney disease) stage 3, GFR 30-59 ml/min (HCC) 03/02/2013  . Critical lower limb ischemia 02/10/2013  . Preop cardiovascular exam 12/18/2012  . Long term current use of anticoagulant therapy 09/12/2012  . Occlusion and stenosis of carotid artery without mention of cerebral infarction 02/13/2012  . S/P CABG x 3     01/23/12 01/24/2012  . S/P carotid endarterectomy   Lt, 01/23/12 01/24/2012  . Carotid arterial disease, severe LICA stenosis [id number]/[id number]  . History of stroke 01/18/2012  . CAD (coronary artery disease) status post CABG July 2013 01/17/2012  . COPD (chronic obstructive pulmonary disease) ([place]) 01/17/2012  . Acute renal insufficiency, SCr up to 1.43 01/17/2012  . Ischemic cardiomyopathy, EF 40-45% 2D 01/15/12 01/17/2012  . Staghorn renal calculus 01/15/2012  . Left ventricular apical thrombus following MI,  NSTEMI, 01/13/12 01/15/2012  . DM (diabetes mellitus) ([place]) 01/15/2012  . NSTEMI (non-ST elevated myocardial infarction) ([place]) 01/14/2012  . HTN (hypertension) 01/14/2012  . Thrombocytopenia ([place]) 01/14/2012  . Tobacco abuse 01/14/2012  . H/O ascending aorta repair     Past Medical History:  Past Medical History:  Diagnosis Date  . Atrial fibrillation ([place])    perioperative  . CAD (coronary artery disease)   . Carotid stenosis    bilateral  . COPD (chronic obstructive pulmonary disease) ([place])   . Critical lower limb ischemia   . Diabetes mellitus   . DM (diabetes mellitus) ([place]) 01/15/2012  . Dyslipidemia   . H/O ascending aorta repair   . Hematuria 01/15/2012  . Hypertension   . Ischemic cardiomyopathy   . Left ventricular apical thrombus following MI, NSTEMI, 01/13/12 01/15/2012  . LV (left ventricular) mural thrombus    inferapical  . S/P CABG x 3 01/22/2012   LIMA to LAD,SVG to second diagonal,SVG to distal RCA  . Staghorn renal calculus 01/15/2012  . Stroke ([place])   . Thrombocytopenia ([place])    Past Surgical History:  Past Surgical History:  Procedure Laterality Date  . ABDOMINAL ANGIOGRAM  03/03/2013   Procedure: ABDOMINAL ANGIOGRAM;  Surgeon: Lorretta Harp, MD;  Location: [facility] CATH LAB;  Service: Cardiovascular;;  . CAROTID ENDARTERECTOMY  01/13/2012  . CORONARY ANGIOGRAM  01/16/2012   Procedure: CORONARY ANGIOGRAM;  Surgeon: Sanda Klein, MD;  Location: [facility] CATH LAB;  Service: Cardiovascular;;  . CORONARY ARTERY BYPASS GRAFT  01/22/2012   Procedure: CORONARY ARTERY BYPASS GRAFTING (CABG);  Surgeon: Ivin Poot, MD;  Location:  [facility] OR;  Service: Open Heart Surgery;  Laterality: N/A;  CABG x three using left internal mammary artery and right leg greater saphenous vein harvested endoscopically  . ENDARTERECTOMY  01/22/2012   Procedure: ENDARTERECTOMY CAROTID;  Surgeon: Rosetta Posner, MD;  Location: [facility];  Service: Vascular;  Laterality: Left;  . LOWER EXTREMITY ANGIOGRAM Left  03/03/2013   Procedure: LOWER EXTREMITY ANGIOGRAM;  Surgeon: Lorretta Harp, MD;  Location: [facility] CATH LAB;  Service: Cardiovascular;  Laterality: Left;  . NM MYOVIEW LTD  02/20/2012   infarct/scar w/mild perinfarct ischemia, EF 38%, abnormal nuc, no sign. ischemia  . REPAIR THORACIC AORTA     Ascending thoracic aorto repair    Assessment & Plan Clinical Impression: Charles Hatfield is a 77 y.o.right handed male with history of CVA, left CEA, CAD with CABG, COPD with remote tobacco abuse, chronic systolic congestive heart failure,atrial fibrillation/ intracardiac thrombus noncompliant to Coumadin and hypertension.Per chart review patient lives with son. Uses a straight point cane for ambulation and still driving. One level home. Presented 06/25/2017 with right sided weakness and slurred speech. INR on admission of 1.08.UDS negative. Cranial CT unremarkable for acute process. Per report, stable remote infarct left occipital lobe and bilateral parietal lobes.CT angiogram head and neck showed occlusion of the right common carotid artery at its mid aspect age indeterminate. Extensive atheromatous irregularity throughout the left ICA and MCA without large vessel occlusion.3 mm focal outpouching arising at the confluence of the right P1 segment posterior communicating artery suspicious for small aneurysm. Patient did not receive TPA.MRI the brain reviewed, showing right internal capsule CVA. Per report, 2 cm acute ischemic nonhemorrhagic infarct involving the posterior limb of the right internal capsule.Echocardiogram with ejection fraction of 09% grade 1 diastolic dysfunction.Chronic Coumadin ongoing. Initially maintained on a dysphagia 1 Honey thick liquid diet with swallow study 06/28/2016 and diet advanced to dysphagia #2 nectar thick liquids.Physical and occupational therapy evaluations completed 06/27/2016 with recommendations of physical medicine rehabilitation consult. Patient was admitted for a comprehensive  rehabilitation program 06/28/17.   Patient transferred to CIR on 06/28/2017 .   Patient currently requires mod with mobility secondary to muscle weakness, impaired timing and sequencing, abnormal tone, unbalanced muscle activation, motor apraxia and decreased coordination, decreased visual acuity and decreased visual perceptual skills and decreased standing balance, decreased postural control and decreased balance strategies.  Prior to hospitalization, patient was independent  with mobility and lived with Son in a (lives in a motel ) home.  Home access is  Level entry.  Patient will benefit from skilled PT intervention to maximize safe functional mobility, minimize fall risk and decrease caregiver burden for planned discharge home with 24 hour supervision.  Anticipate patient will benefit from follow up [place] at discharge.  PT - End of Session Activity Tolerance: Tolerates 30+ min activity with multiple rests Endurance Deficit: Yes PT Assessment Rehab Potential (ACUTE/IP ONLY): Fair PT Barriers to Discharge: Decreased caregiver support PT Barriers to Discharge Comments: visual deficits PT Patient demonstrates impairments in the following area(s): Motor;Balance;Endurance PT Transfers Functional Problem(s): Bed Mobility;Bed to Chair;Car;Floor;Furniture PT Locomotion Functional Problem(s): Ambulation;Wheelchair Mobility;Stairs PT Plan PT Intensity: Minimum of 1-2 x/day ,45 to 90 minutes PT Frequency: 5 out of 7 days PT Duration Estimated Length of Stay: 12-14 PT Treatment/Interventions: Ambulation/gait training;Balance/vestibular training;Discharge planning;Community reintegration;DME/adaptive equipment instruction;Functional electrical stimulation;Functional mobility training;Patient/family education;Pain management;Neuromuscular re-education;Psychosocial support;Splinting/orthotics;Therapeutic Exercise;Therapeutic Activities;Stair training;UE/LE Strength taining/ROM;UE/LE Coordination  activities;Visual/perceptual remediation/compensation;Wheelchair propulsion/positioning PT Transfers Anticipated Outcome(s): supervision PT Locomotion Anticipated Outcome(s): supervision ambulatory with LRAD  PT Recommendation Recommendations for Other Services: Neuropsych consult;Therapeutic Recreation consult Therapeutic Recreation Interventions: Pet therapy Follow Up Recommendations: Home health PT;24 hour supervision/assistance Patient destination: Home Equipment Recommended: To be determined  Skilled Therapeutic Intervention No c/o pain.  PT provided pt education on rehab process, goals of therapy, plan of care, and ELOS.  Transfer training throughout session with cues for keeping feet on floor and pushing up from seated surface.  Attempted gait training, however pt began strongly pushing with RUE, forcing hips away from therapist, required max assist to return to midline and chair to be brought up underneath patient.  Stair negotiation with min assist.  Pt requires max cues throughout session 2/2 visual deficits.  Returned to room at end of session and positioned upright in w/c with call bell in reach and needs met.    PT Evaluation Precautions/Restrictions Precautions Precautions: Fall Precaution Comments: profound visual deficits Restrictions Weight Bearing Restrictions: No Pain Pain Assessment Pain Assessment: No/denies pain Home Living/Prior Functioning Home Living Available Help at Discharge: Family;Available PRN/intermittently Type of Home: (lives in a motel ) Home Access: Level entry Home Layout: One level  Lives With: Son Prior Function Level of Independence: Requires assistive device for independence(used SPC)  Able to Take Stairs?: Yes Driving: No Vocation: Retired Comments: reports he doesn't drive, SPC Vision/Perception  Vision - Assessment Additional Comments: Unable to formally assess vision, but pt demonstrates head turns to locate items and appears to be  intently focusing on objects Perception Perception: Within Functional Limits Praxis Praxis: Intact  Cognition Overall Cognitive Status: Difficult to assess Arousal/Alertness: Awake/alert Orientation Level: Oriented X4 Attention: Selective Focused Attention: Appears intact Sustained Attention: Appears intact Selective Attention: Appears intact Memory: Impaired Awareness: Appears intact Problem Solving: Impaired Safety/Judgment: Impaired Sensation Sensation Light Touch: Appears Intact Hot/Cold: Appears Intact Coordination Gross Motor Movements are Fluid and Coordinated: Yes Fine Motor Movements are Fluid and Coordinated: No Motor  Motor Motor: Hemiplegia(mild)  Mobility Bed Mobility Bed Mobility: Supine to Sit Supine to Sit: 5: Supervision;HOB elevated;With rails Transfers Transfers: Yes Sit to Stand: 4: Min guard Stand to Sit: 4: Min guard Stand Pivot Transfers: 4: Min assist Locomotion  Ambulation Ambulation: Yes Ambulation/Gait Assistance: 3: Mod assist;2: Max assist(progress from mod to max with pushing) Ambulation Distance (Feet): 10 Feet Assistive device: 1 person hand held assist Ambulation/Gait Assistance Details: Manual facilitation for weight shifting;Manual facilitation for weight bearing;Manual facilitation for placement;Verbal cues for precautions/safety;Verbal cues for safe use of DME/AE;Verbal cues for gait pattern;Verbal cues for technique;Verbal cues for sequencing Ambulation/Gait Assistance Details: pt initially ambulating with mod HHA, but progressed to max with strong push to the L despite cues to return to midline.   Stairs / Additional Locomotion Stairs: Yes Stairs Assistance: 4: Min assist Stairs Assistance Details: Verbal cues for technique;Verbal cues for gait pattern;Verbal cues for precautions/safety;Verbal cues for safe use of DME/AE Stair Management Technique: Two rails;Forwards Number of Stairs: 4 Height of Stairs: 6 Wheelchair  Mobility Wheelchair Mobility: No  Trunk/Postural Assessment  Cervical Assessment Cervical Assessment: Within Functional Limits Thoracic Assessment Thoracic Assessment: Within Functional Limits Lumbar Assessment Lumbar Assessment: Within Functional Limits Postural Control Postural Control: Deficits on evaluation Protective Responses: delayed Postural Limitations: decreased  Balance Balance Balance Assessed: Yes Dynamic Sitting Balance Sitting balance - Comments: Min guard throughout. Able to scoot self to EOB without LOB. Static Standing Balance Static Standing - Balance Support: Right upper extremity supported;During functional activity Static Standing - Level of Assistance: 4: Min assist Dynamic Standing Balance Dynamic Standing - Balance  Support: Bilateral upper extremity supported;During functional activity Dynamic Standing - Level of Assistance: 4: Min assist Extremity Assessment  RUE Assessment RUE Assessment: Exceptions to WFL(generalized weakness) LUE Assessment LUE Assessment: Within Functional Limits RLE Assessment RLE Assessment: Exceptions to [facility] RLE Strength Right Hip Flexion: 3+/5 Right Knee Flexion: 3/5 Right Knee Extension: 3+/5 Right Ankle Dorsiflexion: 3/5 Right Ankle Plantar Flexion: 3/5 LLE Assessment LLE Assessment: Exceptions to [facility] LLE Strength Left Hip Flexion: 4/5 Left Knee Flexion: 4+/5 Left Knee Extension: 4+/5 Left Ankle Dorsiflexion: 4+/5 Left Ankle Plantar Flexion: 4+/5   See Function Navigator for Current Functional Status.   Refer to Care Plan for Long Term Goals  Recommendations for other services: Therapeutic Recreation  Pet therapy  Discharge Criteria: Patient will be discharged from PT if patient refuses treatment 3 consecutive times without medical reason, if treatment goals not met, if there is a change in medical status, if patient makes no progress towards goals or if patient is discharged from hospital.  The above  assessment, treatment plan, treatment alternatives and goals were discussed and mutually agreed upon: by patient  Michel Santee 06/29/2017, 12:17 PM

## 2017-06-29 NOTE — Progress Notes (Signed)
Patient information reviewed and entered into eRehab system by Saifan Rayford, RN, CRRN, PPS Coordinator.  Information including medical coding and functional independence measure will be reviewed and updated through discharge.     Per nursing patient was given "Data Collection Information Summary for Patients in Inpatient Rehabilitation Facilities with attached "Privacy Act Statement-Health Care Records" upon admission.  

## 2017-06-29 NOTE — Progress Notes (Signed)
ANTICOAGULATION CONSULT NOTE  Pharmacy Consult for warfarin Indication: atrial fibrillation  No Known Allergies  Patient Measurements: Height: 5\' 6"  (167.6 cm) Weight: 130 lb 15.3 oz (59.4 kg) IBW/kg (Calculated) : 63.8   Vital Signs: Temp: 98.9 F (37.2 C) (01/04 0500) Temp Source: Oral (01/04 0500) BP: 142/78 (01/04 0500) Pulse Rate: 66 (01/04 0500)  Labs: Recent Labs    06/27/17 0532 06/28/17 0811 06/29/17 0515  HGB  --   --  13.5  HCT  --   --  40.3  PLT  --   --  136*  LABPROT 16.7* 21.4* 23.1*  INR 1.37 1.87 2.07  CREATININE  --  1.21 1.21    Estimated Creatinine Clearance: 43.6 mL/min (by C-G formula based on SCr of 1.21 mg/dL).   Medical History: Past Medical History:  Diagnosis Date  . Atrial fibrillation (HCC)    perioperative  . CAD (coronary artery disease)   . Carotid stenosis    bilateral  . COPD (chronic obstructive pulmonary disease) (HCC)   . Critical lower limb ischemia   . Diabetes mellitus   . DM (diabetes mellitus) (HCC) 01/15/2012  . Dyslipidemia   . H/O ascending aorta repair   . Hematuria 01/15/2012  . Hypertension   . Ischemic cardiomyopathy   . Left ventricular apical thrombus following MI, NSTEMI, 01/13/12 01/15/2012  . LV (left ventricular) mural thrombus    inferapical  . S/P CABG x 3 01/22/2012   LIMA to LAD,SVG to second diagonal,SVG to distal RCA  . Staghorn renal calculus 01/15/2012  . Stroke (HCC)   . Thrombocytopenia (HCC)     Medications:  Medications Prior to Admission  Medication Sig Dispense Refill Last Dose  . acetaminophen (TYLENOL) 500 MG tablet Take 1,000 mg by mouth every 6 (six) hours as needed for pain.    06/24/2017 at Unknown time  . albuterol (PROVENTIL HFA;VENTOLIN HFA) 108 (90 BASE) MCG/ACT inhaler Inhale 2 puffs into the lungs every 6 (six) hours as needed for wheezing. (Patient not taking: Reported on 06/11/2017) 1 Inhaler 6 Not Taking at Unknown time  . aspirin EC 81 MG tablet Take 81 mg by mouth  daily.   Past Week at Unknown time  . atorvastatin (LIPITOR) 20 MG tablet TAKE ONE TABLET BY MOUTH ONCE DAILY (Patient not taking: Reported on 06/11/2017) 30 tablet 0 Not Taking at Unknown time  . ipratropium (ATROVENT HFA) 17 MCG/ACT inhaler Inhale 2 puffs into the lungs every 6 (six) hours as needed for wheezing. (Patient not taking: Reported on 06/11/2017) 1 Inhaler 0 Not Taking at Unknown time  . lisinopril (PRINIVIL,ZESTRIL) 5 MG tablet TAKE ONE TABLET BY MOUTH ONCE DAILY (Patient not taking: Reported on 06/11/2017) 30 tablet 0 Not Taking at Unknown time  . NITROSTAT 0.4 MG SL tablet DISSOLVE ONE TABLET UNDER THE TONGUE EVERY 5 MINUTES AS NEEDED FOR CHEST PAIN.  DO NOT EXCEED A TOTAL OF 3 DOSES IN 15 MINUTES NOW (Patient not taking: Reported on 06/11/2017) 25 tablet 2 Not Taking at Unknown time  . traMADol (ULTRAM) 50 MG tablet TAKE ONE TABLET BY MOUTH EVERY 6 HOURS AS NEEDED FOR MODERATE PAIN (Patient not taking: Reported on 02/06/2016) 60 tablet 0 Not Taking at Unknown time  . warfarin (COUMADIN) 5 MG tablet Take 2.5 mg daily 30 tablet 2    Scheduled:  . aspirin EC  81 mg Oral Daily  . atorvastatin  80 mg Oral q1800  . Warfarin - Pharmacist Dosing Inpatient   Does not apply [phone]  Assessment: 32 yoM with PMH Afib, severe PAD, prior CVA, CAD sp CABG, L CEA, HTN, prior aortobifemoral bypass, admitted for stroke Sx but not a TPA candidate. Pt supposed to be taking warfarin for AFib but does not appear to be PTA, and INR ~1.0. Txfer to [name] for neuro workup; Neuro OK to have pharmacy resume warfarin while admitted, no bridging needed. No bleed documented.  Baseline INR 1.08 - up to 2.07 this AM. INR therapeutic after 3 doses. CBC remains wnl, Hgb down to 13.5 and pltc down to 136. No bleeding noted.   Prior anticoagulation: warfarin 5 mg daily, LD unknown (not recent)  Goal of Therapy: INR 2-3  Plan: Warfarin 2.5 mg PO x 1 Daily INR Monitor cbc, for signs of bleeding  Noah Delaine,  RPh Clinical Pharmacist Pager: [phone] #[id number] Main Pharmacy [phone] 06/29/2017 1:44 PM

## 2017-06-30 ENCOUNTER — Encounter (HOSPITAL_COMMUNITY): Payer: Self-pay | Admitting: *Deleted

## 2017-06-30 ENCOUNTER — Inpatient Hospital Stay (HOSPITAL_COMMUNITY): Payer: Federal, State, Local not specified - PPO

## 2017-06-30 ENCOUNTER — Inpatient Hospital Stay (HOSPITAL_COMMUNITY): Payer: Medicare Other | Admitting: Speech Pathology

## 2017-06-30 ENCOUNTER — Inpatient Hospital Stay (HOSPITAL_COMMUNITY): Payer: Federal, State, Local not specified - PPO | Admitting: Physical Therapy

## 2017-06-30 DIAGNOSIS — I6349 Cerebral infarction due to embolism of other cerebral artery: Secondary | ICD-10-CM

## 2017-06-30 LAB — PROTIME-INR
INR: 2
PROTHROMBIN TIME: 22.5 s — AB (ref 11.4–15.2)

## 2017-06-30 MED ORDER — WARFARIN SODIUM 5 MG PO TABS
5.0000 mg | ORAL_TABLET | Freq: Once | ORAL | Status: AC
  Administered 2017-06-30: 5 mg via ORAL
  Filled 2017-06-30: qty 1

## 2017-06-30 MED ORDER — ASPIRIN 81 MG PO CHEW
81.0000 mg | CHEWABLE_TABLET | Freq: Every day | ORAL | Status: DC
  Administered 2017-07-01 – 2017-07-13 (×13): 81 mg via ORAL
  Filled 2017-06-30 (×13): qty 1

## 2017-06-30 NOTE — Progress Notes (Signed)
Subjective/Complaints: Patient follows simple commands.  Speech is dysarthric Review of systems difficult to obtain secondary to poor attention as well as significant dysarthria Objective: Vital Signs: Blood pressure 103/61, pulse 72, temperature 99.5 F (37.5 C), temperature source Oral, resp. rate 18, height '5\' 6"'$  (1.676 m), weight 59.4 kg (130 lb 15.3 oz), SpO2 95 %. No results found. Results for orders placed or performed during the hospital encounter of 06/28/17 (from the past 72 hour(s))  CBC WITH DIFFERENTIAL     Status: Abnormal   Collection Time: 06/29/17  5:15 AM  Result Value Ref Range   WBC 5.9 4.0 - 10.5 K/uL   RBC 4.18 (L) 4.22 - 5.81 MIL/uL   Hemoglobin 13.5 13.0 - 17.0 g/dL   HCT 40.3 39.0 - 52.0 %   MCV 96.4 78.0 - 100.0 fL   MCH 32.3 26.0 - 34.0 pg   MCHC 33.5 30.0 - 36.0 g/dL   RDW 13.9 11.5 - 15.5 %   Platelets 136 (L) 150 - 400 K/uL   Neutrophils Relative % 53 %   Neutro Abs 3.2 1.7 - 7.7 K/uL   Lymphocytes Relative 33 %   Lymphs Abs 1.9 0.7 - 4.0 K/uL   Monocytes Relative 9 %   Monocytes Absolute 0.5 0.1 - 1.0 K/uL   Eosinophils Relative 4 %   Eosinophils Absolute 0.3 0.0 - 0.7 K/uL   Basophils Relative 1 %   Basophils Absolute 0.0 0.0 - 0.1 K/uL  Comprehensive metabolic panel     Status: Abnormal   Collection Time: 06/29/17  5:15 AM  Result Value Ref Range   Sodium 140 135 - 145 mmol/L   Potassium 4.4 3.5 - 5.1 mmol/L   Chloride 108 101 - 111 mmol/L   CO2 25 22 - 32 mmol/L   Glucose, Bld 92 65 - 99 mg/dL   BUN 20 6 - 20 mg/dL   Creatinine, Ser 1.21 0.61 - 1.24 mg/dL   Calcium 8.7 (L) 8.9 - 10.3 mg/dL   Total Protein 5.7 (L) 6.5 - 8.1 g/dL   Albumin 3.4 (L) 3.5 - 5.0 g/dL   AST 22 15 - 41 U/L   ALT 10 (L) 17 - 63 U/L   Alkaline Phosphatase 66 38 - 126 U/L   Total Bilirubin 0.7 0.3 - 1.2 mg/dL   GFR calc non Af Amer 56 (L) >60 mL/min   GFR calc Af Amer >60 >60 mL/min    Comment: (NOTE) The eGFR has been calculated using the CKD EPI  equation. This calculation has not been validated in all clinical situations. eGFR's persistently <60 mL/min signify possible Chronic Kidney Disease.    Anion gap 7 5 - 15  Protime-INR     Status: Abnormal   Collection Time: 06/29/17  5:15 AM  Result Value Ref Range   Prothrombin Time 23.1 (H) 11.4 - 15.2 seconds   INR 2.07   Protime-INR     Status: Abnormal   Collection Time: 06/30/17  5:50 AM  Result Value Ref Range   Prothrombin Time 22.5 (H) 11.4 - 15.2 seconds   INR 2.00      HEENT: Atraumatic normocephalic Cardio: irregular and Normal rate Resp: CTA B/L and Unlabored GI: BS positive and Nontender nondistended Extremity:  Pulses positive and No Edema Skin:   Intact Neuro: Confused, Abnormal Sensory Difficult to assess secondary to poor attention to task, Abnormal Motor 4- in the right deltoid bicep tricep grip hip flexion knee extension ankle dorsiflexion and Abnormal [facility] Ataxic/ dec [facility]  Musc/Skel:  Other No pain with upper limb or lower limb range of motion General no acute distress   Assessment/Plan: 1. Functional deficits secondary to Right side weakness secondary to previous left pontine infarct with dysarthria secondary to acute ischemic nonhemorrhagic infarct involving the posterior limb right internal capsule   which require 3+ hours per day of interdisciplinary therapy in a comprehensive inpatient rehab setting. Physiatrist is providing close team supervision and 24 hour management of active medical problems listed below. Physiatrist and rehab team continue to assess barriers to discharge/monitor patient progress toward functional and medical goals. FIM: Function - Bathing Position: Shower Body parts bathed by patient: Right arm, Left upper leg, Right lower leg, Left arm, Abdomen, Chest, Left lower leg, Front perineal area, Buttocks, Right upper leg Body parts bathed by helper: Back  Function- Upper Body Dressing/Undressing What is the patient wearing?: Pull over  shirt/dress Pull over shirt/dress - Perfomed by patient: Thread/unthread right sleeve, Thread/unthread left sleeve, Put head through opening, Pull shirt over trunk Assist Level: Set up Set up : To obtain clothing/put away Function - Lower Body Dressing/Undressing What is the patient wearing?: Pants Position: Sitting EOB Pants- Performed by patient: Thread/unthread right pants leg, Thread/unthread left pants leg, Pull pants up/down, Fasten/unfasten pants Assist for lower body dressing: Set up  Function - Toileting Toileting steps completed by patient: Adjust clothing prior to toileting Assist level: Touching or steadying assistance (Pt.75%)  Function - Toilet Transfers Toilet transfer assistive device: Elevated toilet seat/BSC over toilet Assist level to toilet: Touching or steadying assistance (Pt > 75%) Assist level from toilet: Touching or steadying assistance (Pt > 75%)  Function - Chair/bed transfer Chair/bed transfer method: Ambulatory Chair/bed transfer assist level: Touching or steadying assistance (Pt > 75%) Chair/bed transfer assistive device: Armrests, Walker Chair/bed transfer details: Verbal cues for technique, Verbal cues for precautions/safety  Function - Locomotion: Wheelchair Will patient use wheelchair at discharge?: (tbd) Type: Manual Max wheelchair distance: 150 Assist Level: Dependent (Pt equals 0%) Assist Level: Dependent (Pt equals 0%) Assist Level: Dependent (Pt equals 0%) Turns around,maneuvers to table,bed, and toilet,negotiates 3% grade,maneuvers on rugs and over doorsills: No Function - Locomotion: Ambulation Assistive device: Walker-rolling Max distance: 30 Assist level: Touching or steadying assistance (Pt > 75%) Assist level: Touching or steadying assistance (Pt > 75%) Walk 50 feet with 2 turns activity did not occur: Safety/medical concerns Walk 150 feet activity did not occur: Safety/medical concerns Walk 10 feet on uneven surfaces activity did  not occur: Safety/medical concerns  Function - Comprehension Comprehension: Auditory Comprehension assist level: Follows basic conversation/direction with no assist  Function - Expression Expression: Verbal Expression assist level: Expresses basic 25 - 49% of the time/requires cueing 50 - 75% of the time. Uses single words/gestures.  Function - Social Interaction Social Interaction assist level: Interacts appropriately 75 - 89% of the time - Needs redirection for appropriate language or to initiate interaction.  Function - Problem Solving Problem solving assist level: Solves basic 75 - 89% of the time/requires cueing 10 - 24% of the time  Function - Memory Memory assist level: Recognizes or recalls 75 - 89% of the time/requires cueing 10 - 24% of the time Patient normally able to recall (first 3 days only): Current season, Staff names and faces, That he or she is in a hospital  Medical Problem List and Plan:  1. Right side weakness secondary to previous left pontine infarct with dysarthria secondary to acute ischemic nonhemorrhagic infarct involving the posterior limb right internal capsule  CIR PT OT speech 2. DVT Prophylaxis/Anticoagulation: Chronic Coumadin.  History of poor compliance monitor for any bleeding episodes  3. Pain Management: Tylenol as needed  4. Mood: Provide emotional support  5. Neuropsych: This patient is capable of making decisions on his own behalf.  6. Skin/Wound Care: Routine skin checks  7. Fluids/Electrolytes/Nutrition: Routine I&O's with follow-up chemistries  8. Dysphagia. Dysphagia #2 nectar liquids. Monitor hydration with follow-up speech therapy  9. Atrial fibrillation. Cardiac rate controlled. Continue Coumadin  10. CAD/left ventricular apical thrombus/NSTEMI with CABG 2013. No chest pain noted. Continue aspirin  11. Hypertension. Monitor with increased mobility  Vitals:   06/29/17 1539 06/30/17 0503  BP: (!) 128/46 103/61  Pulse: 61 72  Resp: 16  18  Temp: (!) 97.5 F (36.4 C) 99.5 F (37.5 C)  SpO2: 100% 95%  Controlled 1/5 12. COPD with remote tobacco abuse. Continue albuterol treatments as needed appears compensated 13. Chronic systolic congestive heart failure. Monitor for any signs of fluid overload  No peripheral edema 14. Hyperlipidemia. Lipitor  15. Vascular dementia secondary to multi-infarcts small vessel disease     LOS (Days) 2 A FACE TO FACE EVALUATION WAS PERFORMED  Charlett Blake 06/30/2017, 12:20 PM

## 2017-06-30 NOTE — IPOC Note (Signed)
Overall Plan of Care [facility]) Patient Details Name: RAYMOUND KATICH MRN: [id number] DOB: October 01, 1940  Admitting Diagnosis: <principal problem not specified>  Hospital Problems: Active Problems:   Dysphagia as late effect of stroke   Embolic cerebral infarction (HCC)   Hemiparesis affecting right side as late effect of cerebrovascular accident (CVA) (HCC)   Residual cognitive deficit as late effect of stroke     Functional Problem List: Nursing Bladder, Bowel, Endurance, Medication Management, Pain, Safety, Skin Integrity  PT Motor, Balance, Endurance  OT Balance, Cognition, Endurance, Motor, Safety, Vision  SLP Cognition, Nutrition  TR         Basic ADL's: OT Eating, Grooming, Bathing, Dressing, Toileting     Advanced  ADL's: OT       Transfers: PT Bed Mobility, Bed to Chair, Car, Floor, Occupational psychologist, Research scientist (life sciences): PT Ambulation, Psychologist, prison and probation services, Stairs     Additional Impairments: OT Fuctional Use of Upper Extremity  SLP Swallowing, Communication, Social Cognition expression Problem Solving, Memory, Awareness, Attention  TR      Anticipated Outcomes Item Anticipated Outcome  Self Feeding S  Swallowing  mod I    Basic self-care  S  Toileting  S   Bathroom Transfers S  Bowel/Bladder  Min assist to [facility] or bathroom  Transfers  supervision  Locomotion  supervision ambulatory with LRAD  Communication  min assist   Cognition  supervision   Pain  <3 on a 0-10 pain scale  Safety/Judgment  Min-mod assist with transfers to wheelchair    Therapy Plan: PT Intensity: Minimum of 1-2 x/day ,45 to 90 minutes PT Frequency: 5 out of 7 days PT Duration Estimated Length of Stay: 12-14 OT Intensity: Minimum of 1-2 x/day, 45 to 90 minutes OT Frequency: 5 out of 7 days OT Duration/Estimated Length of Stay: 14-18 SLP Intensity: Minumum of 1-2 x/day, 30 to 90 minutes SLP Frequency: 3 to 5 out of 7 days SLP Duration/Estimated Length of Stay:  14-21 days     Team Interventions: Nursing Interventions Patient/Family Education, Pain Management, Dysphagia/Aspiration Precaution Training, Bladder Management, Medication Management, Discharge Planning, Bowel Management, Disease Management/Prevention  PT interventions Ambulation/gait training, Balance/vestibular training, Discharge planning, Community reintegration, DME/adaptive equipment instruction, Functional electrical stimulation, Functional mobility training, Patient/family education, Pain management, Neuromuscular re-education, Psychosocial support, Splinting/orthotics, Therapeutic Exercise, Therapeutic Activities, Stair training, UE/LE Strength taining/ROM, UE/LE Coordination activities, Visual/perceptual remediation/compensation, Wheelchair propulsion/positioning  OT Interventions Warden/ranger, Community reintegration, Disease mangement/prevention, Neuromuscular re-education, Equities trader education, Self Care/advanced ADL retraining, Therapeutic Exercise, UE/LE Coordination activities, Wheelchair propulsion/positioning, Cognitive remediation/compensation, Discharge planning, DME/adaptive equipment instruction, Functional mobility training, Pain management, Psychosocial support, Skin care/wound managment, Therapeutic Activities, UE/LE Strength taining/ROM, Visual/perceptual remediation/compensation  SLP Interventions Cognitive remediation/compensation, Cueing hierarchy, Dysphagia/aspiration precaution training, Environmental controls, Internal/external aids, Functional tasks, Patient/family education  TR Interventions    SW/CM Interventions Discharge Planning, Psychosocial Support, Patient/Family Education   Barriers to Discharge MD  Medical stability and Lack of/limited family support  Nursing Decreased caregiver support, Medical stability, Home environment access/layout, Incontinence, Inaccessible home environment, Lack of/limited family support, Medication  compliance Patient and son living in hotel per report received from [place], [place] on 3W  PT Decreased caregiver support visual deficits  OT Decreased caregiver support, Other (comments)(vision) unsure if family will be able to provide supervision  SLP Decreased caregiver support, Home environment access/layout pt lives in a motel, 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 and is only available intermittently  SW       Team Discharge Planning: Destination:  PT-Home ,OT- Home , SLP-Home Projected Follow-up: PT-Home health PT, 24 hour supervision/assistance, OT-  Home health OT, SLP-Home Health SLP, Outpatient SLP, Skilled Nursing facility, 24 hour supervision/assistance Projected Equipment Needs: PT-To be determined, OT- To be determined, SLP-To be determined Equipment Details: PT- , OT-  Patient/family involved in discharge planning: PT- Patient,  OT-Patient, SLP-Patient  MD ELOS: 14-17d Medical Rehab Prognosis:  Excellent Assessment:  77 y.o.right handed male with history of CVA, left CEA, CAD with CABG, COPD with remote tobacco abuse, chronic systolic congestive heart failure,atrial fibrillation/ intracardiac thrombus noncompliant to Coumadin and hypertension.Per chart review patient lives with son. Uses a straight point cane for ambulation and still driving. One level home. Presented 06/25/2017 with right sided weakness and slurred speech. INR on admission of 1.08.UDS negative. Cranial CT unremarkable for acute process. Per report, stable remote infarct left occipital lobe and bilateral parietal lobes.CT angiogram head and neck showed occlusion of the right common carotid artery at its mid aspect age indeterminate. Extensive atheromatous irregularity throughout the left ICA and MCA without large vessel occlusion.3 mm focal outpouching arising at the confluence of the right P1 segment posterior communicating artery suspicious for small aneurysm. Patient did not receive TPA.MRI the brain reviewed, showing right  internal capsule CVA. Per report, 2 cm acute ischemic nonhemorrhagic infarct involving the posterior limb of the right internal capsule.Echocardiogram with ejection fraction of 45% grade 1 diastolic dysfunction.Chronic Coumadin ongoing. Initially maintained on a dysphagia 1 Honey thick liquid diet with swallow study 06/28/2016 and diet advanced to dysphagia #2 nectar thick liquids.   Now requiring 24/7 Rehab RN,MD, as well as CIR level PT, OT and SLP.  Treatment team will focus on ADLs and mobility with goals set at Sup/MinA  See Team Conference Notes for weekly updates to the plan of care

## 2017-06-30 NOTE — Progress Notes (Signed)
Occupational Therapy Session Note  Patient Details  Name: Charles Hatfield MRN: [id number] Date of Birth: [birth year]  Today's Date: 06/30/2017 OT Individual Time: 5110-2111 OT Individual Time Calculation (min): 57 min    Short Term Goals: Week 1:  OT Short Term Goal 1 (Week 1): Pt will stand at sink wiht CGA to groom for 2/2 items to demo improved endurance OT Short Term Goal 2 (Week 1): Pt will bathe will locate 3/3 items on sink while grooming/bathing with no more than 1 VC OT Short Term Goal 3 (Week 1): Pt will transfer complete clothing managment for toileting wiht CGA OT Short Term Goal 4 (Week 1): Pt will transfer into shower wiht CGA  Skilled Therapeutic Interventions/Progress Updates:    1:1. Pt declines bathing this session, however pt requesting to eat. Focus of session on self feeding, visual scanning, visual compensation techniques and memory. OT states items on plate 2x throughout session and pt able to remember 3/5 items upon questioning. Pt requires max VC and intermittent HOH A to locate items on tray/food on plate 2/2 decreased visual acuity. Pt requires VC forehead turning techniques and max Vc for swallowing 2x after each bite. Pt demo difficulty managing secretions throughout session even before meal. Pt reports needing to use toilet and transfers EOB<>w/c<> toilet with min A for transfer and steps of toileting with Vc for use of grab bars. Exited session with pt seated in bed with call light in reach and exit alarm on.  Therapy Documentation Precautions:  Precautions Precautions: Fall Precaution Comments: profound visual deficits Restrictions Weight Bearing Restrictions: No  See Function Navigator for Current Functional Status.   Therapy/Group: Individual Therapy  Shon Hale 06/30/2017, 8:56 AM

## 2017-06-30 NOTE — Progress Notes (Signed)
Speech Language Pathology Daily Session Note  Patient Details  Name: Charles Hatfield MRN: [id number] Date of Birth: [birth year]  Today's Date: 06/30/2017 SLP Individual Time: 1330-1410 SLP Individual Time Calculation (min): 40 min  Short Term Goals: Week 1: SLP Short Term Goal 1 (Week 1): Pt will consume therapeutic trials of thin liquids with minimal overt s/s of aspiration and supervision cues for use of swallowing precautions.   SLP Short Term Goal 2 (Week 1): Pt will consume dys 2 textures and nectar thick liquids with supervision cues for use of swallowing precautions and minimal overt s/s of aspiration.   SLP Short Term Goal 3 (Week 1): Pt will slow rate, overarticulate, and increase vocal intensity to achieve intelligibility at the phrase level with mod assist.  SLP Short Term Goal 4 (Week 1): Pt will complete basic, familiar tasks with min assist for problem solving.   SLP Short Term Goal 5 (Week 1): Pt will selectively attend to basic tasks for 15 minutes with min verbal cues for redirection.   SLP Short Term Goal 6 (Week 1): Pt will recall daily information with mod cues for use of external aids.    Skilled Therapeutic Interventions: Skilled treatment session focused on speech intelligibility goals due to patient declining solids/liquids 2/2 just completing lunch. SLP facilitated session by education patient on speech intelligibility strategies while utilizing the word "SLOP" to maximize recall of strategies. SLP also provided Mod-Max A verbal cues for use of strategies at the sentence level during an informal conversation. Patient's biggest barrier to intelligibility appears to be his speech rate. Patient also appeared confused throughout session as he reported he lived at the "[place] motel" and was perseverative on concerns aboutt his son. CSW already aware. Patient left supine in bed with all needs within reach. Continue with current plan of care.       Function:    Cognition Comprehension Comprehension assist level: Follows basic conversation/direction with no assist  Expression   Expression assist level: Expresses basic 25 - 49% of the time/requires cueing 50 - 75% of the time. Uses single words/gestures.  Social Interaction Social Interaction assist level: Interacts appropriately 75 - 89% of the time - Needs redirection for appropriate language or to initiate interaction.  Problem Solving Problem solving assist level: Solves basic 75 - 89% of the time/requires cueing 10 - 24% of the time  Memory Memory assist level: Recognizes or recalls 75 - 89% of the time/requires cueing 10 - 24% of the time    Pain No/Denies Pain   Therapy/Group: Individual Therapy  Jonika Critz 06/30/2017, 3:20 PM

## 2017-06-30 NOTE — Progress Notes (Signed)
Occupational Therapy Session Note  Patient Details  Name: Charles Hatfield MRN: [id number] Date of Birth: [birth year]  Today's Date: 06/30/2017 OT Individual Time: 1435-1530 OT Individual Time Calculation (min): 55 min    Short Term Goals: Week 1:  OT Short Term Goal 1 (Week 1): Pt will stand at sink wiht CGA to groom for 2/2 items to demo improved endurance OT Short Term Goal 2 (Week 1): Pt will bathe will locate 3/3 items on sink while grooming/bathing with no more than 1 VC OT Short Term Goal 3 (Week 1): Pt will transfer complete clothing managment for toileting wiht CGA OT Short Term Goal 4 (Week 1): Pt will transfer into shower wiht CGA  Skilled Therapeutic Interventions/Progress Updates:    1:1. Pt agreeable to bathing and dressing this session. Pt without c/o pain. Pt stand pivot transfer throughout session with min A and HOH A for hand placement and VC for which direction to turn hips. With pt clothes placed on white bed for contrasting colors, pt unable to identify colors of clothing calling everything black. Pt dons shirt and socks with supervision and VC for orientation of shirt. Pt threads BLE into wrong pant legs, however pt able to correct after cueing. Pt stands with min A at sink to brush teeth with set up to apply toothpaste. Exited session with pt supine in bed with call light in reach and all needs met.   Therapy Documentation Precautions:  Precautions Precautions: Fall Precaution Comments: profound visual deficits Restrictions Weight Bearing Restrictions: No General:   Vital Signs: Therapy Vitals Temp: 97.8 F (36.6 C) Temp Source: Oral Pulse Rate: 64 Resp: 17 BP: (!) 119/58 Patient Position (if appropriate): Lying Oxygen Therapy SpO2: 98 % O2 Device: Not Delivered  See Function Navigator for Current Functional Status.   Therapy/Group: Individual Therapy  Tonny Branch 06/30/2017, 5:08 PM

## 2017-06-30 NOTE — Progress Notes (Signed)
ANTICOAGULATION CONSULT NOTE  Pharmacy Consult for warfarin Indication: atrial fibrillation  No Known Allergies  Patient Measurements: Height: 5\' 6"  (167.6 cm) Weight: 130 lb 15.3 oz (59.4 kg) IBW/kg (Calculated) : 63.8   Vital Signs: Temp: 99.5 F (37.5 C) (01/05 0503) Temp Source: Oral (01/05 0503) BP: 103/61 (01/05 0503) Pulse Rate: 72 (01/05 0503)  Labs: Recent Labs    06/28/17 0811 06/29/17 0515 06/30/17 0550  HGB  --  13.5  --   HCT  --  40.3  --   PLT  --  136*  --   LABPROT 21.4* 23.1* 22.5*  INR 1.87 2.07 2.00  CREATININE 1.21 1.21  --     Estimated Creatinine Clearance: 43.6 mL/min (by C-G formula based on SCr of 1.21 mg/dL).   Medical History: Past Medical History:  Diagnosis Date  . Atrial fibrillation (HCC)    perioperative  . CAD (coronary artery disease)   . Carotid stenosis    bilateral  . COPD (chronic obstructive pulmonary disease) (HCC)   . Critical lower limb ischemia   . Diabetes mellitus   . DM (diabetes mellitus) (HCC) 01/15/2012  . Dyslipidemia   . H/O ascending aorta repair   . Hematuria 01/15/2012  . Hypertension   . Ischemic cardiomyopathy   . Left ventricular apical thrombus following MI, NSTEMI, 01/13/12 01/15/2012  . LV (left ventricular) mural thrombus    inferapical  . S/P CABG x 3 01/22/2012   LIMA to LAD,SVG to second diagonal,SVG to distal RCA  . Staghorn renal calculus 01/15/2012  . Stroke (HCC)   . Thrombocytopenia (HCC)     Medications:  Medications Prior to Admission  Medication Sig Dispense Refill Last Dose  . acetaminophen (TYLENOL) 500 MG tablet Take 1,000 mg by mouth every 6 (six) hours as needed for pain.    06/24/2017 at Unknown time  . albuterol (PROVENTIL HFA;VENTOLIN HFA) 108 (90 BASE) MCG/ACT inhaler Inhale 2 puffs into the lungs every 6 (six) hours as needed for wheezing. (Patient not taking: Reported on 06/11/2017) 1 Inhaler 6 Not Taking at Unknown time  . aspirin EC 81 MG tablet Take 81 mg by mouth  daily.   Past Week at Unknown time  . atorvastatin (LIPITOR) 20 MG tablet TAKE ONE TABLET BY MOUTH ONCE DAILY (Patient not taking: Reported on 06/11/2017) 30 tablet 0 Not Taking at Unknown time  . ipratropium (ATROVENT HFA) 17 MCG/ACT inhaler Inhale 2 puffs into the lungs every 6 (six) hours as needed for wheezing. (Patient not taking: Reported on 06/11/2017) 1 Inhaler 0 Not Taking at Unknown time  . lisinopril (PRINIVIL,ZESTRIL) 5 MG tablet TAKE ONE TABLET BY MOUTH ONCE DAILY (Patient not taking: Reported on 06/11/2017) 30 tablet 0 Not Taking at Unknown time  . NITROSTAT 0.4 MG SL tablet DISSOLVE ONE TABLET UNDER THE TONGUE EVERY 5 MINUTES AS NEEDED FOR CHEST PAIN.  DO NOT EXCEED A TOTAL OF 3 DOSES IN 15 MINUTES NOW (Patient not taking: Reported on 06/11/2017) 25 tablet 2 Not Taking at Unknown time  . traMADol (ULTRAM) 50 MG tablet TAKE ONE TABLET BY MOUTH EVERY 6 HOURS AS NEEDED FOR MODERATE PAIN (Patient not taking: Reported on 02/06/2016) 60 tablet 0 Not Taking at Unknown time  . warfarin (COUMADIN) 5 MG tablet Take 2.5 mg daily 30 tablet 2    Scheduled:  . aspirin EC  81 mg Oral Daily  . atorvastatin  80 mg Oral q1800  . warfarin  5 mg Oral ONCE-1800  . Warfarin - Pharmacist  Dosing Inpatient   Does not apply q1800    Assessment: 66 yoM with PMH Afib, severe PAD, prior CVA, CAD sp CABG, L CEA, HTN, prior aortobifemoral bypass, admitted for stroke Sx but not a TPA candidate. Pt supposed to be taking warfarin for AFib but does not appear to be PTA, initial INR ~1.0. Txfer to [facility] for neuro workup; Neuro OK to have pharmacy resume warfarin while admitted, no bridging needed. No bleed documented.  INR 2.07>2.00 this AM. CBC of 1/4 stable. No bleeding noted.    Per Epic, last recorded anticoagulation visit was in 2016. Looks like was taking 2.5 mg MWF and 5 mg all other days.  Goal of Therapy: INR 2-3  Plan: Warfarin 5 mg PO x 1 Daily INR Monitor CBC and signs of bleeding  Adline Potter,  PharmD Pharmacy Resident Pager: [phone]

## 2017-06-30 NOTE — Progress Notes (Signed)
Physical Therapy Session Note  Patient Details  Name: RIDGELY FAWCETT MRN: [id number] Date of Birth: 10/14/1940  Today's Date: 06/30/2017 PT Individual Time: 1030-1100 PT Individual Time Calculation (min): 30 min   Short Term Goals: Week 1:  PT Short Term Goal 1 (Week 1): Pt will ambulate 62' with LRAD and mod assist PT Short Term Goal 2 (Week 1): Pt will negotiate 12 steps with min assist PT Short Term Goal 3 (Week 1): Pt will complete BERG Balance Scale  Skilled Therapeutic Interventions/Progress Updates:    no c/o pain.  Session focus on functional mobility and forced LLE weightbearing.    Pt continues to maintain LLE in elevated position when in the chair, and when attempting to stand does not place LLE on floor despite cues and facilitation.  Attempted sit<>stand with RLE wedged, however pt will not flex R knee to allow LLE to come to floor and pushing to the L so returned to chair.  Sit<>stand x2 at // bars with therapist providing forced L weight shift and pt completes 2x10 toe taps to // bar platform for forced weight bearing.  Pt returned to room at end of session and left upright in w/c with QRB in place to await SLP.    Therapy Documentation Precautions:  Precautions Precautions: Fall Precaution Comments: profound visual deficits Restrictions Weight Bearing Restrictions: No   See Function Navigator for Current Functional Status.   Therapy/Group: Individual Therapy  Stephania Fragmin 06/30/2017, 12:47 PM

## 2017-07-01 LAB — PROTIME-INR
INR: 2.26
Prothrombin Time: 24.8 seconds — ABNORMAL HIGH (ref 11.4–15.2)

## 2017-07-01 MED ORDER — WARFARIN SODIUM 5 MG PO TABS
5.0000 mg | ORAL_TABLET | Freq: Once | ORAL | Status: AC
  Administered 2017-07-01: 5 mg via ORAL
  Filled 2017-07-01: qty 1

## 2017-07-01 NOTE — Progress Notes (Signed)
ANTICOAGULATION CONSULT NOTE  Pharmacy Consult for warfarin Indication: atrial fibrillation  No Known Allergies  Patient Measurements: Height: 5\' 6"  (167.6 cm) Weight: 130 lb 15.3 oz (59.4 kg) IBW/kg (Calculated) : 63.8   Vital Signs: Temp: 97.8 F (36.6 C) (01/06 0539) Temp Source: Oral (01/06 0539) BP: 119/34 (01/06 0539) Pulse Rate: 66 (01/06 0539)  Labs: Recent Labs    06/29/17 0515 06/30/17 0550 07/01/17 0510  HGB 13.5  --   --   HCT 40.3  --   --   PLT 136*  --   --   LABPROT 23.1* 22.5* 24.8*  INR 2.07 2.00 2.26  CREATININE 1.21  --   --     Estimated Creatinine Clearance: 43.6 mL/min (by C-G formula based on SCr of 1.21 mg/dL).   Medical History: Past Medical History:  Diagnosis Date  . Atrial fibrillation (HCC)    perioperative  . CAD (coronary artery disease)   . Carotid stenosis    bilateral  . COPD (chronic obstructive pulmonary disease) (HCC)   . Critical lower limb ischemia   . Diabetes mellitus   . DM (diabetes mellitus) (HCC) 01/15/2012  . Dyslipidemia   . H/O ascending aorta repair   . Hematuria 01/15/2012  . Hypertension   . Ischemic cardiomyopathy   . Left ventricular apical thrombus following MI, NSTEMI, 01/13/12 01/15/2012  . LV (left ventricular) mural thrombus    inferapical  . S/P CABG x 3 01/22/2012   LIMA to LAD,SVG to second diagonal,SVG to distal RCA  . Staghorn renal calculus 01/15/2012  . Stroke (HCC)   . Thrombocytopenia (HCC)     Medications:  Medications Prior to Admission  Medication Sig Dispense Refill Last Dose  . acetaminophen (TYLENOL) 500 MG tablet Take 1,000 mg by mouth every 6 (six) hours as needed for pain.    06/24/2017 at Unknown time  . albuterol (PROVENTIL HFA;VENTOLIN HFA) 108 (90 BASE) MCG/ACT inhaler Inhale 2 puffs into the lungs every 6 (six) hours as needed for wheezing. (Patient not taking: Reported on 06/11/2017) 1 Inhaler 6 Not Taking at Unknown time  . aspirin EC 81 MG tablet Take 81 mg by mouth  daily.   Past Week at Unknown time  . atorvastatin (LIPITOR) 20 MG tablet TAKE ONE TABLET BY MOUTH ONCE DAILY (Patient not taking: Reported on 06/11/2017) 30 tablet 0 Not Taking at Unknown time  . ipratropium (ATROVENT HFA) 17 MCG/ACT inhaler Inhale 2 puffs into the lungs every 6 (six) hours as needed for wheezing. (Patient not taking: Reported on 06/11/2017) 1 Inhaler 0 Not Taking at Unknown time  . lisinopril (PRINIVIL,ZESTRIL) 5 MG tablet TAKE ONE TABLET BY MOUTH ONCE DAILY (Patient not taking: Reported on 06/11/2017) 30 tablet 0 Not Taking at Unknown time  . NITROSTAT 0.4 MG SL tablet DISSOLVE ONE TABLET UNDER THE TONGUE EVERY 5 MINUTES AS NEEDED FOR CHEST PAIN.  DO NOT EXCEED A TOTAL OF 3 DOSES IN 15 MINUTES NOW (Patient not taking: Reported on 06/11/2017) 25 tablet 2 Not Taking at Unknown time  . traMADol (ULTRAM) 50 MG tablet TAKE ONE TABLET BY MOUTH EVERY 6 HOURS AS NEEDED FOR MODERATE PAIN (Patient not taking: Reported on 02/06/2016) 60 tablet 0 Not Taking at Unknown time  . warfarin (COUMADIN) 5 MG tablet Take 2.5 mg daily 30 tablet 2    Scheduled:  . aspirin  81 mg Oral Daily  . atorvastatin  80 mg Oral q1800  . Warfarin - Pharmacist Dosing Inpatient   Does not apply  [id number]    Assessment: 6 yoM with PMH Afib, severe PAD, prior CVA, CAD sp CABG, L CEA, HTN, prior aortobifemoral bypass, admitted for stroke Sx but not a TPA candidate. Pt supposed to be taking warfarin for AFib but does not appear to be PTA, initial INR ~1.0. Txfer to [facility] for neuro workup; Neuro OK to have pharmacy resume warfarin while admitted, no bridging needed. No bleed documented.  INR 2.26 this AM. CBC of 1/4 stable. No bleeding noted.    Per Epic, last recorded anticoagulation visit was in 2016. Looks like was taking 2.5 mg MWF and 5 mg all other days.  Goal of Therapy: INR 2-3  Plan: Warfarin 5 mg PO x 1 Daily INR Monitor for signs of bleeding  Adline Potter, PharmD Pharmacy Resident Pager:  [phone]

## 2017-07-02 ENCOUNTER — Inpatient Hospital Stay (HOSPITAL_COMMUNITY): Payer: Federal, State, Local not specified - PPO | Admitting: Physical Therapy

## 2017-07-02 ENCOUNTER — Inpatient Hospital Stay (HOSPITAL_COMMUNITY): Payer: Medicare Other | Admitting: Speech Pathology

## 2017-07-02 ENCOUNTER — Inpatient Hospital Stay (HOSPITAL_COMMUNITY): Payer: Federal, State, Local not specified - PPO | Admitting: Occupational Therapy

## 2017-07-02 LAB — PROTIME-INR
INR: 2.64
Prothrombin Time: 28 seconds — ABNORMAL HIGH (ref 11.4–15.2)

## 2017-07-02 MED ORDER — WARFARIN SODIUM 2.5 MG PO TABS
2.5000 mg | ORAL_TABLET | Freq: Once | ORAL | Status: AC
  Administered 2017-07-02: 2.5 mg via ORAL
  Filled 2017-07-02: qty 1

## 2017-07-02 NOTE — Progress Notes (Signed)
ANTICOAGULATION CONSULT NOTE  Pharmacy Consult for warfarin Indication: atrial fibrillation  No Known Allergies  Patient Measurements: Height: 5\' 6"  (167.6 cm) Weight: 130 lb 15.3 oz (59.4 kg) IBW/kg (Calculated) : 63.8   Vital Signs: Temp: 97.5 F (36.4 C) (01/07 0458) Temp Source: Oral (01/07 0458) BP: 116/55 (01/07 0458) Pulse Rate: 63 (01/07 0458)  Labs: Recent Labs    06/30/17 0550 07/01/17 0510 07/02/17 0732  LABPROT 22.5* 24.8* 28.0*  INR 2.00 2.26 2.64    Estimated Creatinine Clearance: 43.6 mL/min (by C-G formula based on SCr of 1.21 mg/dL).   Assessment: 38 yoM with PMH Afib, severe PAD, prior CVA, CAD sp CABG, L CEA, HTN, prior aortobifemoral bypass, admitted for stroke Sx but not a TPA candidate. Pt supposed to be taking warfarin for AFib but does not appear to be PTA, and INR ~1.0. Txfer to [facility] for [facility] workup; Neuro OK to have pharmacy resume warfarin while admitted, no bridging needed. No bleed documented.  INR 2.64 this morning, no bleeding noted. Pt likely will need 5mg  4 days/week 2.5 mg 3 days/week   Goal of Therapy: INR 2-3  Plan: Warfarin 2.5 mg PO x 1 Daily INR Monitor cbc, for signs of bleeding  Bayard Hugger, PharmD, BCPS  Clinical Pharmacist  Pager: [phone]  07/02/2017 1:10 PM

## 2017-07-02 NOTE — Progress Notes (Signed)
Occupational Therapy Session Note  Patient Details  Name: Charles Hatfield MRN: [id number] Date of Birth: 09/12/1940  Today's Date: 07/02/2017  Session 1 OT Individual Time: 4403-4742 OT Individual Time Calculation (min): 75 min   Session 2 OT Individual Time: 1300-1330 OT Individual Time Calculation (min): 30 min    Short Term Goals: Week 1:  OT Short Term Goal 1 (Week 1): Pt will stand at sink wiht CGA to groom for 2/2 items to demo improved endurance OT Short Term Goal 2 (Week 1): Pt will bathe will locate 3/3 items on sink while grooming/bathing with no more than 1 VC OT Short Term Goal 3 (Week 1): Pt will transfer complete clothing managment for toileting wiht CGA OT Short Term Goal 4 (Week 1): Pt will transfer into shower wiht CGA  Skilled Therapeutic Interventions/Progress Updates:  Session 1   OT treatment session focused on functional ambulation, visual compensatory strategies, and functional use of R UE. Pt completed stand-pivot bed >wc with RW and min A + VC for pathfinding. Provided pt with walker bag and pt completed obstacle course in hallway using R hand to locate large rubber numbers placed on railings. Pt unable to see numbers, but followed commands well to pace out where numbers should be. Good use of stereognosis to then locate walker bag to place numbers. Obstacle course completed 3x.  Pt given medium red thera-putty and was instructed on hand exercises focused on rotation, pinch, and thumb opposition.   Session 2 Treatment session focused on dc planning and self-feeding. Pt's son present during OT session and dicussed PLOF and dc plan. Pt's son seems to have a good understanding of rehab process and therapy goals for his father. Pt's on states he has a rollator walker, wc, shower chair with back, walk-in shower. Lives in a Motel for now, but son is looking for more permanent housing. Pt ambulated to the sink w/ min guard A, RW, and cues for pathfinding and RW placement.  Worked on locating faucet, soap, and paper towels. Pt returned to wc and set-up for lunch. Pt needed set-up A for appropriate size bites and educated pt's son on how to provide appropriate supervision for meals. Pt left in care of son providing meal supervision.   Therapy Documentation Precautions:  Precautions Precautions: Fall Precaution Comments: profound visual deficits Restrictions Weight Bearing Restrictions: No Pain:  none/denies pain  See Function Navigator for Current Functional Status.   Therapy/Group: Individual Therapy  Mal Amabile 07/02/2017, 1:34 PM

## 2017-07-02 NOTE — Progress Notes (Signed)
Speech Language Pathology Daily Session Note  Patient Details  Name: Charles Hatfield MRN: [id number] Date of Birth: [birth year]  Today's Date: 07/02/2017 SLP Individual Time: 1030-1100 SLP Individual Time Calculation (min): 30 min  Short Term Goals: Week 1: SLP Short Term Goal 1 (Week 1): Pt will consume therapeutic trials of thin liquids with minimal overt s/s of aspiration and supervision cues for use of swallowing precautions.   SLP Short Term Goal 2 (Week 1): Pt will consume dys 2 textures and nectar thick liquids with supervision cues for use of swallowing precautions and minimal overt s/s of aspiration.   SLP Short Term Goal 3 (Week 1): Pt will slow rate, overarticulate, and increase vocal intensity to achieve intelligibility at the phrase level with mod assist.  SLP Short Term Goal 4 (Week 1): Pt will complete basic, familiar tasks with min assist for problem solving.   SLP Short Term Goal 5 (Week 1): Pt will selectively attend to basic tasks for 15 minutes with min verbal cues for redirection.   SLP Short Term Goal 6 (Week 1): Pt will recall daily information with mod cues for use of external aids.    Skilled Therapeutic Interventions: Skilled treatment session focused on dysphagia and communication goals. At first pt refused any PO intake but with mild encouragement he was willing to consume thin water. Pt consumed 4 oz via cup without overt s/s of aspiration. Therefore recommend pt be allowed water protocol. Education completed with nursing. SLP further facilitated session by providing Max A cues and re-education using "SLOP" strategies to increase speech intelligibility. Pt able to demonstrate over articulation at the simple phrase level to communicate need to use urinal. Pt was handed off to nursing. Continue per current plan of care.      Function:  Eating Eating   Modified Consistency Diet: No Eating Assist Level: Set up assist for;Supervision or verbal cues            Cognition Comprehension Comprehension assist level: Understands basic 50 - 74% of the time/ requires cueing 25 - 49% of the time  Expression   Expression assist level: Expresses basic 50 - 74% of the time/requires cueing 25 - 49% of the time. Needs to repeat parts of sentences.  Social Interaction Social Interaction assist level: Interacts appropriately 50 - 74% of the time - May be physically or verbally inappropriate.  Problem Solving Problem solving assist level: Solves basic 50 - 74% of the time/requires cueing 25 - 49% of the time  Memory Memory assist level: Recognizes or recalls 50 - 74% of the time/requires cueing 25 - 49% of the time    Pain    Therapy/Group: Individual Therapy  Charles Hatfield 07/02/2017, 12:13 PM

## 2017-07-02 NOTE — Progress Notes (Signed)
Physical Therapy Session Note  Patient Details  Name: Charles Hatfield MRN: [id number] Date of Birth: [birth year]  Today's Date: 07/02/2017 PT Individual Time: 1430-1530 PT Individual Time Calculation (min): 60 min   Short Term Goals: Week 1:  PT Short Term Goal 1 (Week 1): Pt will ambulate 75' with LRAD and mod assist PT Short Term Goal 2 (Week 1): Pt will negotiate 12 steps with min assist PT Short Term Goal 3 (Week 1): Pt will complete BERG Balance Scale  Skilled Therapeutic Interventions/Progress Updates:    no c/o pain at rest.  Pt on toilet on arrival, min assist for stand/pivot back to w/c.  Pt's son present initially and provided information regarding pt's PLOF, visual problems beginning prior to this CVA, and painful callous to L toe baseline.  PT educated son on pt's CLOF, ELOS, and goals for d/c.  Remainder of session focus on gait training with RW.  Pt ambulates 40'+40'+50' with RW.  Rest breaks between each trial.  PT applied R PLS AFO and shoe cover for reduced toe drag on RLE.  Pt requires min assist for gait and constant cues for walker positioning and widening BOS.  Gait training with 2x4 between feet to encourage increased BOS 3x30'.  Pt able to continue increased BOS x5' without 2x4 for feedback.  Pt returned to room at end of session, returned to bed with min assist and positioned with call bell in reach and needs met.   Therapy Documentation Precautions:  Precautions Precautions: Fall Precaution Comments: profound visual deficits Restrictions Weight Bearing Restrictions: No   See Function Navigator for Current Functional Status.   Therapy/Group: Individual Therapy  Michel Santee 07/02/2017, 4:42 PM

## 2017-07-03 ENCOUNTER — Inpatient Hospital Stay (HOSPITAL_COMMUNITY): Payer: Medicare Other

## 2017-07-03 ENCOUNTER — Inpatient Hospital Stay (HOSPITAL_COMMUNITY): Payer: Federal, State, Local not specified - PPO | Admitting: Physical Therapy

## 2017-07-03 ENCOUNTER — Inpatient Hospital Stay (HOSPITAL_COMMUNITY): Payer: Medicare Other | Admitting: Speech Pathology

## 2017-07-03 ENCOUNTER — Inpatient Hospital Stay (HOSPITAL_COMMUNITY): Payer: Federal, State, Local not specified - PPO | Admitting: Occupational Therapy

## 2017-07-03 LAB — PROTIME-INR
INR: 3.17
Prothrombin Time: 32.2 seconds — ABNORMAL HIGH (ref 11.4–15.2)

## 2017-07-03 MED ORDER — WARFARIN SODIUM 1 MG PO TABS
1.0000 mg | ORAL_TABLET | Freq: Once | ORAL | Status: AC
  Administered 2017-07-03: 1 mg via ORAL
  Filled 2017-07-03: qty 1

## 2017-07-03 NOTE — Progress Notes (Signed)
Patient noted with a coughing episode during supervised breakfast on Dys 2 fine chopped meats. Patient required staff to empty bolus of food pocketed in mouth. Speech therapy notified of coughing episode. Continue to monitor swallowing .  Charles Hatfield

## 2017-07-03 NOTE — Progress Notes (Signed)
ANTICOAGULATION CONSULT NOTE  Pharmacy Consult for warfarin Indication: atrial fibrillation  No Known Allergies  Patient Measurements: Height: 5\' 6"  (167.6 cm) Weight: 130 lb 15.3 oz (59.4 kg) IBW/kg (Calculated) : 63.8   Vital Signs: Temp: 98.2 F (36.8 C) (01/08 0156) Temp Source: Oral (01/08 0156) BP: 160/57 (01/08 0156) Pulse Rate: 72 (01/08 0156)  Labs: Recent Labs    07/01/17 0510 07/02/17 0732 07/03/17 0450  LABPROT 24.8* 28.0* 32.2*  INR 2.26 2.64 3.17    Estimated Creatinine Clearance: 43.6 mL/min (by C-G formula based on SCr of 1.21 mg/dL).   Assessment: 71 yoM with PMH Afib, severe PAD, prior CVA, CAD sp CABG, L CEA, HTN, prior aortobifemoral bypass, admitted for stroke Sx but not a TPA candidate. Pt supposed to be taking warfarin for AFib but does not appear to be PTA, and INR ~1.0. Txfer to [facility] for neuro workup; Neuro OK to have pharmacy resume warfarin while admitted, no bridging needed. No bleed documented.  INR 2.64 > 3.17 this morning, no bleeding per chart   Goal of Therapy: INR 2-3  Plan: Warfarin 1 mg PO x 1 Daily INR Monitor cbc, for signs of bleeding  Bayard Hugger, PharmD, BCPS  Clinical Pharmacist  Pager: [phone]  07/03/2017 1:21 PM

## 2017-07-03 NOTE — Progress Notes (Signed)
Physical Therapy Session Note  Patient Details  Name: Charles Hatfield MRN: [id number] Date of Birth: 1941-04-06  Today's Date: 07/03/2017 PT Individual Time: 0830-0930 PT Individual Time Calculation (min): 60 min   Short Term Goals: Week 1:  PT Short Term Goal 1 (Week 1): Pt will ambulate 75' with LRAD and mod assist PT Short Term Goal 2 (Week 1): Pt will negotiate 12 steps with min assist PT Short Term Goal 3 (Week 1): Pt will complete BERG Balance Scale  Skilled Therapeutic Interventions/Progress Updates:    Pt seated EOB finishing breakfast with NA, agreeable to participate in therapy session. Pt reports no pain this AM. Pt has had incontinence of urine in brief, sit to stand Min Assist to RW for brief change and pericare, standing balance with Min Assist. Sitting balance EOB with SBA while donning new shirt, standing balance with Min Assist and RW while pulling up new brief and pants. Stand pivot transfer Min Assist for balance with RW, v/c due to pt's visual deficits. Ambulation 2 x 150 ft with RW and Min Assist, R AFO, v/c to increase BOS. Pt exhibits R knee hyperextension with ambulation as well as scissoring gait pattern. Standing R knee flex/TKE x 10 reps in // bars, focus on control and not hyperextending. Pt left seated in w/c in room with QRB in place, needs in reach.  Therapy Documentation Precautions:  Precautions Precautions: Fall Precaution Comments: profound visual deficits Restrictions Weight Bearing Restrictions: No  See Function Navigator for Current Functional Status.   Therapy/Group: Individual Therapy  Peter Congo, PT, DPT  07/03/2017, 12:10 PM

## 2017-07-03 NOTE — Progress Notes (Signed)
Occupational Therapy Session Note  Patient Details  Name: Charles Hatfield MRN: [id number] Date of Birth: Jun 08, 1941  Today's Date: 07/03/2017 OT Individual Time: 7092-9574 OT Individual Time Calculation (min): 74 min    Short Term Goals: Week 1:  OT Short Term Goal 1 (Week 1): Pt will stand at sink wiht CGA to groom for 2/2 items to demo improved endurance OT Short Term Goal 2 (Week 1): Pt will bathe will locate 3/3 items on sink while grooming/bathing with no more than 1 VC OT Short Term Goal 3 (Week 1): Pt will transfer complete clothing managment for toileting wiht CGA OT Short Term Goal 4 (Week 1): Pt will transfer into shower wiht CGA  Skilled Therapeutic Interventions/Progress Updates:    OT treatment session focused on self-feeding, low vision, and modified bathing/dressing. Pt came to sitting EOB to eat lunch. OT educated pt on self-feeding strategies for low vision with pt able to locate food items after OT showed pt where food located on plate. Pt without aspiration on puree diet. Pt then ambulated to bathroom for shower with cues for pathfinding and to locate tub bench. Bathing completed with overall supervision and min A for balance when standing to wash buttocks 2/2 posterior lean. Pt brought to the sink for dressing and grooming tasks with assistance to orient clothing. Pt able to locate comb on sink legde with contrast of white sink and black brush. Pt brushed teeth with set-up A and suction used 2/2 diminished oral motor skills. Pt left seated in wc at end of session with needs met, safety belt on, and call bell in reach.   Therapy Documentation Precautions:  Precautions Precautions: Fall Precaution Comments: profound visual deficits Restrictions Weight Bearing Restrictions: No Pain: Pain Assessment Pain Assessment: No/denies pain  See Function Navigator for Current Functional Status.   Therapy/Group: Individual Therapy  [name] 07/03/2017, 2:01 PM

## 2017-07-03 NOTE — Progress Notes (Signed)
Subjective/Complaints: Pt states he lives with son, awake and responsive Review of systems difficult to obtain secondary to poor attention as well as significant dysarthria Objective: Vital Signs: Blood pressure (!) 160/57, pulse 72, temperature 98.2 F (36.8 C), temperature source Oral, resp. rate 17, height 5\' 6"  (1.676 m), weight 59.4 kg (130 lb 15.3 oz), SpO2 98 %. No results found. Results for orders placed or performed during the hospital encounter of 06/28/17 (from the past 72 hour(s))  Protime-INR     Status: Abnormal   Collection Time: 07/01/17  5:10 AM  Result Value Ref Range   Prothrombin Time 24.8 (H) 11.4 - 15.2 seconds   INR 2.26   Protime-INR     Status: Abnormal   Collection Time: 07/02/17  7:32 AM  Result Value Ref Range   Prothrombin Time 28.0 (H) 11.4 - 15.2 seconds   INR 2.64   Protime-INR     Status: Abnormal   Collection Time: 07/03/17  4:50 AM  Result Value Ref Range   Prothrombin Time 32.2 (H) 11.4 - 15.2 seconds   INR 3.17      HEENT: Atraumatic normocephalic Cardio: irregular and Normal rate Resp: CTA B/L and Unlabored GI: BS positive and Nontender nondistended Extremity:  Pulses positive and No Edema Skin:   Intact Neuro: Confused, Abnormal Sensory Difficult to assess secondary to poor attention to task, Abnormal Motor 4- in the right deltoid bicep tricep grip hip flexion knee extension ankle dorsiflexion and Abnormal FMC Ataxic/ dec FMC Left neglect/field cut Musc/Skel:  Other No pain with upper limb or lower limb range of motion General no acute distress   Assessment/Plan: 1. Functional deficits secondary to Right side weakness secondary to previous left pontine infarct with dysarthria secondary to acute ischemic nonhemorrhagic infarct involving the posterior limb right internal capsule   which require 3+ hours per day of interdisciplinary therapy in a comprehensive inpatient rehab setting. Physiatrist is providing close team supervision and 24  hour management of active medical problems listed below. Physiatrist and rehab team continue to assess barriers to discharge/monitor patient progress toward functional and medical goals. FIM: Function - Bathing Position: Shower Body parts bathed by patient: Right arm, Left upper leg, Right lower leg, Left arm, Abdomen, Chest, Left lower leg, Front perineal area, Buttocks, Right upper leg Body parts bathed by helper: Back Assist Level: Touching or steadying assistance(Pt > 75%)  Function- Upper Body Dressing/Undressing What is the patient wearing?: Pull over shirt/dress Pull over shirt/dress - Perfomed by patient: Thread/unthread right sleeve, Thread/unthread left sleeve, Put head through opening, Pull shirt over trunk Assist Level: Set up Set up : To obtain clothing/put away Function - Lower Body Dressing/Undressing What is the patient wearing?: Non-skid slipper socks Position: Sitting EOB Pants- Performed by patient: Thread/unthread right pants leg, Thread/unthread left pants leg, Pull pants up/down, Fasten/unfasten pants Non-skid slipper socks- Performed by patient: Don/doff right sock, Don/doff left sock(per report) Assist for footwear: Supervision/touching assist Assist for lower body dressing: Set up  Function - Toileting Toileting steps completed by patient: Adjust clothing prior to toileting Toileting steps completed by helper: Adjust clothing prior to toileting, Performs perineal hygiene, Adjust clothing after toileting Toileting Assistive Devices: Grab bar or rail Assist level: Touching or steadying assistance (Pt.75%)  Function - Archivist transfer assistive device: Elevated toilet seat/BSC over toilet Assist level to toilet: Moderate assist (Pt 50 - 74%/lift or lower) Assist level from toilet: Moderate assist (Pt 50 - 74%/lift or lower)  Function - Chair/bed transfer Chair/bed  transfer method: Ambulatory Chair/bed transfer assist level: Touching or  steadying assistance (Pt > 75%) Chair/bed transfer assistive device: Armrests, Walker Chair/bed transfer details: Verbal cues for technique, Verbal cues for precautions/safety  Function - Locomotion: Wheelchair Will patient use wheelchair at discharge?: (tbd) Type: Manual Max wheelchair distance: 150 Assist Level: Dependent (Pt equals 0%) Assist Level: Dependent (Pt equals 0%) Assist Level: Dependent (Pt equals 0%) Turns around,maneuvers to table,bed, and toilet,negotiates 3% grade,maneuvers on rugs and over doorsills: No Function - Locomotion: Ambulation Assistive device: Walker-rolling Max distance: 30 Assist level: Touching or steadying assistance (Pt > 75%) Assist level: Touching or steadying assistance (Pt > 75%) Walk 50 feet with 2 turns activity did not occur: Safety/medical concerns Walk 150 feet activity did not occur: Safety/medical concerns Walk 10 feet on uneven surfaces activity did not occur: Safety/medical concerns  Function - Comprehension Comprehension: Auditory Comprehension assist level: Understands basic 50 - 74% of the time/ requires cueing 25 - 49% of the time  Function - Expression Expression: Verbal Expression assist level: Expresses basic 50 - 74% of the time/requires cueing 25 - 49% of the time. Needs to repeat parts of sentences.  Function - Social Interaction Social Interaction assist level: Interacts appropriately 50 - 74% of the time - May be physically or verbally inappropriate.  Function - Problem Solving Problem solving assist level: Solves basic 50 - 74% of the time/requires cueing 25 - 49% of the time  Function - Memory Memory assist level: Recognizes or recalls 50 - 74% of the time/requires cueing 25 - 49% of the time Patient normally able to recall (first 3 days only): Current season, Staff names and faces, That he or she is in a hospital  Medical Problem List and Plan:  1. 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 DVT Prophylaxis/Anticoagulation: Chronic Coumadin.  History of poor compliance monitor for any bleeding episodes  3. Pain Management: Tylenol as needed  4. Mood: Provide emotional support  5. Neuropsych: This patient is capable of making decisions on his own behalf.  6. Skin/Wound Care: Routine skin checks  7. Fluids/Electrolytes/Nutrition: Routine I&O's with follow-up chemistries  8. Dysphagia. Dysphagia #2 nectar liquids. Monitor hydration with follow-up speech therapy  9. Atrial fibrillation. Cardiac rate controlled. Continue Coumadin  10. CAD/left ventricular apical thrombus/NSTEMI with CABG 2013. No chest pain noted. Continue aspirin  11. Hypertension. Monitor with increased mobility  Vitals:   07/02/17 1400 07/03/17 0156  BP: 138/71 (!) 160/57  Pulse: 63 72  Resp: 18 17  Temp: 98.4 F (36.9 C) 98.2 F (36.8 C)  SpO2: 100% 98%  systolic elevation 1/8, resume low dose zestril 12. COPD with remote tobacco abuse. Continue albuterol treatments as needed appears compensated 13. Chronic systolic congestive heart failure. Monitor for any signs of fluid overload  No peripheral edema 14. Hyperlipidemia. Lipitor  15. Vascular dementia secondary to multi-infarcts small vessel disease     LOS (Days) 5 A FACE TO FACE EVALUATION WAS PERFORMED  [name] 07/03/2017, 6:51 AM

## 2017-07-03 NOTE — Progress Notes (Addendum)
Physical Therapy Session Note  Patient Details  Name: Charles Hatfield MRN: [id number] Date of Birth: 12-30-1940  Today's Date: 07/03/2017 PT Individual Time: 1102-1130 PT Individual Time Calculation (min): 28 min   Short Term Goals: Week 1:  PT Short Term Goal 1 (Week 1): Pt will ambulate 19' with LRAD and mod assist PT Short Term Goal 2 (Week 1): Pt will negotiate 12 steps with min assist PT Short Term Goal 3 (Week 1): Pt will complete BERG Balance Scale  Skilled Therapeutic Interventions/Progress Updates:    Patient in room requesting to lie down.  Patient sit to stand min A and ambulated from doorway to bed 15' min A with assist for balance, R LE stability and safety.  Patient sit to stand x 5 with min A and cues for hand placement.  Sit to supine min A and cues for positioning.  Patient performed LE therex in supine including single leg bridge, SLR. sidelying hip abduction (clamshell), hooklying marching and lateral trunk rotation.  Left in supine with call bell in reach.  Therapy Documentation Precautions:  Precautions Precautions: Fall Precaution Comments: profound visual deficits Restrictions Weight Bearing Restrictions: No Pain: Pain Assessment Pain Assessment: No/denies pain   See Function Navigator for Current Functional Status.   Therapy/Group: Individual Therapy  Elray Mcgregor 07/03/2017, 1:18 PM

## 2017-07-03 NOTE — Discharge Instructions (Addendum)
Inpatient Rehab Discharge Instructions  Charles Hatfield Discharge date and time: No discharge date for patient encounter.   Activities/Precautions/ Functional Status: Activity: activity as tolerated Diet: Dysphagia #1 nectar liquid Wound Care: none needed Functional status:  ___ No restrictions     ___ Walk up steps independently ___ 24/7 supervision/assistance   ___ Walk up steps with assistance ___ Intermittent supervision/assistance  ___ Bathe/dress independently ___ Walk with walker     _x__ Bathe/dress with assistance ___ Walk Independently    ___ Shower independently ___ Walk with assistance    ___ Shower with assistance ___ No alcohol     ___ Return to work/school ________  COMMUNITY REFERRALS UPON DISCHARGE:   Home Health:   PT     OT     ST     RN  Agency:  [name] [employer] [facility]:  [phone] Medical Equipment/Items Ordered:  Agricultural consultant  Agency/Supplier:  [facility]        Phone:  [phone]  GENERAL COMMUNITY RESOURCES FOR PATIENT/FAMILY: Support Groups:  [facility] Stroke Support Group                              Meets every 2nd Thursday of the month from 3-4 PM                              [facility], In the dayroom of [facility]                              For more information, call [phone]  Special Instructions: Home health nurse to check INR 07/17/2017 result to [facility] [facility] [phone] fax number [phone]   My questions have been answered and I understand these instructions. I will adhere to these goals and the provided educational materials after my discharge from the hospital.  Patient/Caregiver Signature _______________________________ Date __________  Clinician Signature _______________________________________ Date __________  Please bring this form and your medication list with you to all your follow-up doctor's appointments. Information on my medicine - Coumadin    (Warfarin)  This medication education was reviewed with me or my healthcare representative as part of my discharge preparation.    Why was Coumadin prescribed for you? Coumadin was prescribed for you because you have a blood clot or a medical condition that can cause an increased risk of forming blood clots. Blood clots can cause serious health problems by blocking the flow of blood to the heart, lung, or brain. Coumadin can prevent harmful blood clots from forming. As a reminder your indication for Coumadin is:   Stroke Prevention Because Of Atrial Fibrillation  What test will check on my response to Coumadin? While on Coumadin (warfarin) you will need to have an INR test regularly to ensure that your dose is keeping you in the desired range. The INR (international normalized ratio) number is calculated from the result of the laboratory test called prothrombin time (PT).  If an INR APPOINTMENT HAS NOT ALREADY BEEN MADE FOR YOU please schedule an appointment to have this lab work done by your health care provider within 7 days. Your INR goal is usually a number between:  2 to 3   What  do you need to  know  About  COUMADIN? Take Coumadin (warfarin) exactly as  prescribed by your healthcare provider about the same time each day.  DO NOT stop taking without talking to the doctor who prescribed the medication.  Stopping without other blood clot prevention medication to take the place of Coumadin may increase your risk of developing a new clot or stroke.  Get refills before you run out.  What do you do if you miss a dose? If you miss a dose, take it as soon as you remember on the same day then continue your regularly scheduled regimen the next day.  Do not take two doses of Coumadin at the same time.  Important Safety Information A possible side effect of Coumadin (Warfarin) is an increased risk of bleeding. You should call your healthcare provider right away if you experience any of the  following: ? Bleeding from an injury or your nose that does not stop. ? Unusual colored urine (red or dark brown) or unusual colored stools (red or black). ? Unusual bruising for unknown reasons. ? A serious fall or if you hit your head (even if there is no bleeding).  Some foods or medicines interact with Coumadin (warfarin) and might alter your response to warfarin. To help avoid this: ? Eat a balanced diet, maintaining a consistent amount of Vitamin K. ? Notify your provider about major diet changes you plan to make. ? Avoid alcohol or limit your intake to 1 drink for women and 2 drinks for men per day. (1 drink is 5 oz. wine, 12 oz. beer, or 1.5 oz. liquor.)  Make sure that ANY health care provider who prescribes medication for you knows that you are taking Coumadin (warfarin).  Also make sure the healthcare provider who is monitoring your Coumadin knows when you have started a new medication including herbals and non-prescription products.  Coumadin (Warfarin)  Major Drug Interactions  Increased Warfarin Effect Decreased Warfarin Effect  Alcohol (large quantities) Antibiotics (esp. Septra/Bactrim, Flagyl, Cipro) Amiodarone (Cordarone) Aspirin (ASA) Cimetidine (Tagamet) Megestrol (Megace) NSAIDs (ibuprofen, naproxen, etc.) Piroxicam (Feldene) Propafenone (Rythmol SR) Propranolol (Inderal) Isoniazid (INH) Posaconazole (Noxafil) Barbiturates (Phenobarbital) Carbamazepine (Tegretol) Chlordiazepoxide (Librium) Cholestyramine (Questran) Griseofulvin Oral Contraceptives Rifampin Sucralfate (Carafate) Vitamin K   Coumadin (Warfarin) Major Herbal Interactions  Increased Warfarin Effect Decreased Warfarin Effect  Garlic Ginseng Ginkgo biloba Coenzyme Q10 Green tea St. Johns wort    Coumadin (Warfarin) FOOD Interactions  Eat a consistent number of servings per week of foods HIGH in Vitamin K (1 serving =  cup)  Collards (cooked, or boiled & drained) Kale (cooked, or  boiled & drained) Mustard greens (cooked, or boiled & drained) Parsley *serving size only =  cup Spinach (cooked, or boiled & drained) Swiss chard (cooked, or boiled & drained) Turnip greens (cooked, or boiled & drained)  Eat a consistent number of servings per week of foods MEDIUM-HIGH in Vitamin K (1 serving = 1 cup)  Asparagus (cooked, or boiled & drained) Broccoli (cooked, boiled & drained, or raw & chopped) Brussel sprouts (cooked, or boiled & drained) *serving size only =  cup Lettuce, raw (green leaf, endive, romaine) Spinach, raw Turnip greens, raw & chopped   These websites have more information on Coumadin (warfarin):  http://www.king-russell.com/; https://www.hines.net/;

## 2017-07-03 NOTE — Progress Notes (Signed)
Speech Language Pathology Daily Session Note  Patient Details  Name: Charles Hatfield MRN: [id number] Date of Birth: 1941-05-04  Today's Date: 07/03/2017 SLP Individual Time: 1324-4010 SLP Individual Time Calculation (min): 40 min  Short Term Goals: Week 1: SLP Short Term Goal 1 (Week 1): Pt will consume therapeutic trials of thin liquids with minimal overt s/s of aspiration and supervision cues for use of swallowing precautions.   SLP Short Term Goal 2 (Week 1): Pt will consume dys 2 textures and nectar thick liquids with supervision cues for use of swallowing precautions and minimal overt s/s of aspiration.   SLP Short Term Goal 3 (Week 1): Pt will slow rate, overarticulate, and increase vocal intensity to achieve intelligibility at the phrase level with mod assist.  SLP Short Term Goal 4 (Week 1): Pt will complete basic, familiar tasks with min assist for problem solving.   SLP Short Term Goal 5 (Week 1): Pt will selectively attend to basic tasks for 15 minutes with min verbal cues for redirection.   SLP Short Term Goal 6 (Week 1): Pt will recall daily information with mod cues for use of external aids.    Skilled Therapeutic Interventions: Skilled treatment session focused on dysphagia and speech goals. Upon arrival, patient appeared lethargic and reported he did not feel well. Suspect due to decreased activity tolerance 2/2 sedentary lifestyle prior to admission. Patient demonstrated decreased speech intelligibility and required overall for Max A multimodal cues for use of strategies to achieve ~50% intelligibility at the phrase and sentence level. Patient also consumed a snack of Dys. 1 textures with nectar-thick liquids without overt s/s of aspiration. However, patient demonstrated prolonged AP transit and Mod A verbal cues with extra time for use of multiple swallows. Recommend patient downgrade to Dys. 1 textures secondary to fatigue, lethargy, and multiple reports of RN and NT of large  coughing episodes with meals. Patient verbalized understanding and agreement. Patient left upright in wheelchair with all needs within reach. Continue with current plan of care.      Function:  Eating Eating   Modified Consistency Diet: Yes Eating Assist Level: Set up assist for;Supervision or verbal cues;Helper scoops food on utensil   Eating Set Up Assist For: Opening containers Helper Scoops Food on Utensil: Occasionally     Cognition Comprehension Comprehension assist level: Understands basic 75 - 89% of the time/ requires cueing 10 - 24% of the time  Expression   Expression assist level: Expresses basic 25 - 49% of the time/requires cueing 50 - 75% of the time. Uses single words/gestures.  Social Interaction Social Interaction assist level: Interacts appropriately 75 - 89% of the time - Needs redirection for appropriate language or to initiate interaction.  Problem Solving Problem solving assist level: Solves basic 25 - 49% of the time - needs direction more than half the time to initiate, plan or complete simple activities  Memory Memory assist level: Recognizes or recalls 25 - 49% of the time/requires cueing 50 - 75% of the time    Pain Pain Assessment Pain Assessment: No/denies pain  Therapy/Group: Individual Therapy  Charles Hatfield 07/03/2017, 2:48 PM

## 2017-07-04 ENCOUNTER — Inpatient Hospital Stay (HOSPITAL_COMMUNITY): Payer: Federal, State, Local not specified - PPO | Admitting: Occupational Therapy

## 2017-07-04 ENCOUNTER — Encounter (HOSPITAL_COMMUNITY): Payer: Federal, State, Local not specified - PPO | Admitting: Psychology

## 2017-07-04 ENCOUNTER — Inpatient Hospital Stay (HOSPITAL_COMMUNITY): Payer: Federal, State, Local not specified - PPO

## 2017-07-04 ENCOUNTER — Inpatient Hospital Stay (HOSPITAL_COMMUNITY): Payer: Medicare Other

## 2017-07-04 ENCOUNTER — Inpatient Hospital Stay (HOSPITAL_COMMUNITY): Payer: Medicare Other | Admitting: Speech Pathology

## 2017-07-04 LAB — PROTIME-INR
INR: 2.48
Prothrombin Time: 26.7 seconds — ABNORMAL HIGH (ref 11.4–15.2)

## 2017-07-04 MED ORDER — WARFARIN SODIUM 2.5 MG PO TABS
2.5000 mg | ORAL_TABLET | Freq: Once | ORAL | Status: AC
  Administered 2017-07-04: 2.5 mg via ORAL
  Filled 2017-07-04: qty 1

## 2017-07-04 MED ORDER — POLYVINYL ALCOHOL 1.4 % OP SOLN
2.0000 [drp] | OPHTHALMIC | Status: DC | PRN
  Administered 2017-07-04: 2 [drp] via OPHTHALMIC
  Filled 2017-07-04 (×2): qty 15

## 2017-07-04 NOTE — Progress Notes (Signed)
Occupational Therapy Session Note  Patient Details  Name: Charles Hatfield MRN: [id number] Date of Birth: 1941-03-26  Today's Date: 07/04/2017 OT Individual Time: 0802-0900 OT Individual Time Calculation (min): 58 min   Short Term Goals: Week 1:  OT Short Term Goal 1 (Week 1): Pt will stand at sink wiht CGA to groom for 2/2 items to demo improved endurance OT Short Term Goal 2 (Week 1): Pt will bathe will locate 3/3 items on sink while grooming/bathing with no more than 1 VC OT Short Term Goal 3 (Week 1): Pt will transfer complete clothing managment for toileting wiht CGA OT Short Term Goal 4 (Week 1): Pt will transfer into shower wiht CGA  Skilled Therapeutic Interventions/Progress Updates:    OT treatment session focused on modified bathing/dressing. Pt ambulated into bathroom with RW and min guard A for turning, but pt able to distinguish doorway into bathroom today. Cues for pathfinding to get to shower bench. RN entered to administer meds and remove IV. Pt set-up for bathing, then able to wash all body parts with supervision. Educated on leaning method to wash buttocks  Discussed low vision strategies for BADL including organizing grooming items and placing them always in the same place. Set-up A to thread pant legs, then worked on strategies to maintain anterior weight shift while standing to pull up pants. Pt donned shirt with set-up A to orient shirt, and set-up to don socks. Incorporated music therapy throughout session with pt preferred "Elvis Presley" station and pt able to sing to some of the songs. Pt left seated in wc with safety belt on and needs met.   Therapy Documentation Precautions:  Precautions Precautions: Fall Precaution Comments: profound visual deficits Restrictions Weight Bearing Restrictions: No Pain: Pain Assessment Pain Assessment: No/denies pain Pain Score: 0-No pain  See Function Navigator for Current Functional Status.   Therapy/Group: Individual  Therapy  [name] 07/04/2017, 8:35 AM

## 2017-07-04 NOTE — Consult Note (Signed)
Neuropsychological Consultation   Patient:   Charles Hatfield   DOB:   [birth year]  MR Number:  [id number]  Location:  [name] [id number] [place] [address] Dept: [phone] Loc: [phone]           Date of Service:   07/04/2017  Start Time:   1 PM End Time:   2 PM  Provider/Observer:  Arley Phenix, Psy.D.       Clinical Neuropsychologist       Billing Code/Service: [id number] 4 Units  Chief Complaint:    Charles Hatfield is a 77 y.o.right handed male with history of CVA, left CEA, CAD with CABG, COPD with remote tobacco abuse, chronic systolic congestive heart failure,atrial fibrillation/ intracardiac thrombus noncompliant to Coumadin and hypertension.  MRI reviewed showed right internal capsule CVA.  He has continued with some memory and other cognitive deficits from current and past CVAs.  Patient has had issues with coping and depression due to significant loss of function.    Reason for Service:  Charles Hatfield was referred from neuropsychological/psychological consultation due to adjustment issues and depressive symptoms.  Below is the HPI for the current admission.  HPI: Charles Hatfield is a 77 y.o.right handed male with history of CVA, left CEA, CAD with CABG, COPD with remote tobacco abuse, chronic systolic congestive heart failure,atrial fibrillation/ intracardiac thrombus noncompliant to Coumadin and hypertension.Per chart review patient lives with son. Uses a straight point cane for ambulation and still driving. One level home. Presented 06/25/2017 with right sided weakness and slurred speech. INR on admission of 1.08.Charles Hatfield. Cranial CT unremarkable for acute process. Per report, stable remote infarct left occipital lobe and bilateral parietal lobes.CT angiogram head and neck showed occlusion of the right common carotid artery at its mid aspect age indeterminate. Extensive  atheromatous irregularity throughout the left ICA and MCA without large vessel occlusion.3 mm focal outpouching arising at the confluence of the right P1 segment posterior communicating artery suspicious for small aneurysm. Patient did not receive TPA.MRI the brain reviewed, showing right internal capsule CVA. Per report, 2 cm acute ischemic nonhemorrhagic infarct involving the posterior limb of the right internal capsule.Echocardiogram with ejection fraction of 45% grade 1 diastolic dysfunction.Chronic Coumadin ongoing. Initially maintained on a dysphagia 1 Honey thick liquid diet with swallow study 06/28/2016 and diet advanced to dysphagia #2 nectar thick liquids.Physical and occupational therapy evaluations completed 06/27/2016 with recommendations of physical medicine rehabilitation consult. Patient was admitted for a comprehensive rehabilitation program   Current Status:  Patient reports issues with depression and frustration due to progressing loss of function.  He has been more fatigued and has been difficult to [facility] condition, which has played roll in his noncompliance to coumadin and hypertensive issues.  Behavioral Observation: Charles Hatfield  presents as a 77 y.o.-year-old Right Caucasian Male who appeared his stated age. his dress was Appropriate and he was Well Groomed and his manners were Appropriate to the situation.  his participation was indicative of Appropriate and Drowsy behaviors.  There were physical disabilities noted.  he displayed an appropriate level of cooperation and motivation.     Interactions:    Minimal Appropriate and Drowsy  Attention:   abnormal and attention span appeared shorter than expected for age  Memory:   abnormal; global memory impairment noted  Visuo-spatial:  not examined  Speech (Volume):  low  Speech:   non-fluent aphasia; slurred  Thought  Process:  Coherent and Circumstantial  Though Content:  WNL; not suicidal  Orientation:   person, place  and situation  Judgment:   Fair  Planning:   Fair  Affect:    Depressed  Mood:    Dysphoric  Insight:   Fair  Intelligence:   normal  Medical History:   Past Medical History:  Diagnosis Date  . Atrial fibrillation (HCC)    perioperative  . CAD (coronary artery disease)   . Carotid stenosis    bilateral  . COPD (chronic obstructive pulmonary disease) (HCC)   . Critical lower limb ischemia   . Diabetes mellitus   . DM (diabetes mellitus) (HCC) 01/15/2012  . Dyslipidemia   . H/O ascending aorta repair   . Hematuria 01/15/2012  . Hypertension   . Ischemic cardiomyopathy   . Left ventricular apical thrombus following MI, NSTEMI, 01/13/12 01/15/2012  . LV (left ventricular) mural thrombus    inferapical  . S/P CABG x 3 01/22/2012   LIMA to LAD,SVG to second diagonal,SVG to distal RCA  . Staghorn renal calculus 01/15/2012  . Stroke (HCC)   . Thrombocytopenia (HCC)         Psychiatric History:  Patient does reports prior issues with depression and frustration.    Family Med/Psych History:  Family History  Problem Relation Age of Onset  . Heart disease Father   . Heart attack Brother     Risk of Suicide/Violence: low Patient denies SI or HI  Impression/DX:  Charles Hatfield is a 77 y.o.right handed male with history of CVA, left CEA, CAD with CABG, COPD with remote tobacco abuse, chronic systolic congestive heart failure,atrial fibrillation/ intracardiac thrombus noncompliant to Coumadin and hypertension.  MRI reviewed showed right internal capsule CVA.  He has continued with some memory and other cognitive deficits from current and past CVAs.  Patient has had issues with coping and depression due to significant loss of function.   Patient reports issues with depression and frustration due to progressing loss of function.  He has been more fatigued and has been difficult to [facility] condition, which has played roll in his noncompliance to coumadin and hypertensive  issues.  Diagnosis:   Status Post Stroke        Electronically Signed   _______________________ Arley Phenix, Psy.D.

## 2017-07-04 NOTE — Progress Notes (Signed)
Social Work Patient ID: Charles Hatfield, male   DOB: 08-01-1940, 77 y.o.   MRN: [id number]   CSW met with pt to update him on team conference discussion and targeted d/c date of Friday, July 13, 2017.  Pt was pleased with this date and feels he can continue to work on CIR over the next week.  CSW explained that we would be working on his speech, swallowing, and memory in addition to his physical functioning over the next week.  Pt expressed understanding.  CSW attempted to call pt's son, Ronalee Belts, to let him know of the above, but had to leave him a message for return call.  CSW remains available to assist as needed.

## 2017-07-04 NOTE — Patient Care Conference (Signed)
Inpatient RehabilitationTeam Conference and Plan of Care Update Date: 07/04/2017   Time: 11:15 AM    Patient Name: Charles Hatfield      Medical Record Number: [id number]  Date of Birth: September 18, 1940 Sex: Male         Room/Bed: 4W13C/4W13C-01 Payor Info: Payor: MEDICARE / Plan: MEDICARE PART A AND B / Product Type: *No Product type* /    Admitting Diagnosis: CVA  Admit Date/Time:  06/28/2017  5:49 PM Admission Comments: No comment available   Primary Diagnosis:  <principal problem not specified> Principal Problem: <principal problem not specified>  Patient Active Problem List   Diagnosis Date Noted  . Embolic cerebral infarction (HCC) 06/28/2017  . Hemiparesis affecting right side as late effect of cerebrovascular accident (CVA) (HCC)   . Residual cognitive deficit as late effect of stroke   . Dysphagia as late effect of stroke   . Chronic combined systolic and diastolic CHF (congestive heart failure) (HCC)   . Benign essential HTN   . PAF (paroxysmal atrial fibrillation) (HCC)   . Intracardiac thrombosis   . Tachypnea   . Right hemiparesis (HCC) 06/25/2017  . Dysarthria 06/25/2017  . Dysphagia 06/25/2017  . CVA (cerebral vascular accident) (HCC) 06/25/2017  . Long term (current) use of anticoagulants 06/25/2017  . Mixed hyperlipidemia 06/15/2013  . CKD (chronic kidney disease) stage 3, GFR 30-59 ml/min (HCC) 03/02/2013  . Critical lower limb ischemia 02/10/2013  . Preop cardiovascular exam 12/18/2012  . Long term current use of anticoagulant therapy 09/12/2012  . Occlusion and stenosis of carotid artery without mention of cerebral infarction 02/13/2012  . S/P CABG x 3     01/23/12 01/24/2012  . S/P carotid endarterectomy   Lt, 01/23/12 01/24/2012  . Carotid arterial disease, severe LICA stenosis 01/18/2012  . History of stroke 01/18/2012  . CAD (coronary artery disease) status post CABG July 2013 01/17/2012  . COPD (chronic obstructive pulmonary disease) (HCC) 01/17/2012  . Acute  renal insufficiency, SCr up to 1.43 01/17/2012  . Ischemic cardiomyopathy, EF 40-45% 2D 01/15/12 01/17/2012  . Staghorn renal calculus 01/15/2012  . Left ventricular apical thrombus following MI, NSTEMI, 01/13/12 01/15/2012  . DM (diabetes mellitus) (HCC) 01/15/2012  . NSTEMI (non-ST elevated myocardial infarction) (HCC) 01/14/2012  . HTN (hypertension) 01/14/2012  . Thrombocytopenia (HCC) 01/14/2012  . Tobacco abuse 01/14/2012  . H/O ascending aorta repair     Expected Discharge Date: Expected Discharge Date: 07/13/17  Team Members Present: Physician leading conference: Dr. Claudette Laws Social Worker Present: Staci Acosta, LCSW Nurse Present: Kennyth Arnold, RN PT Present: Midge Minium, PT OT Present: Kearney Hard, OT SLP Present: Feliberto Gottron, SLP PPS Coordinator present : Tora Duck, RN, CRRN     Current Status/Progress Goal Weekly Team Focus  Medical   cognitive deficits from multiple CVAs, Dysphagia on D1 nectars  reduce fall risk, DVT risk and recurrent CVA risk  determine chronic vs new cognitive deficits   Bowel/Bladder   Continent of bowel/bladder with urgency. LBM 07/02/16  Patient to be able to have regular bowel/bladder function with min assist  Assess bowel/bladder function q shift and as needed   Swallow/Nutrition/ Hydration   Dys. 1 textures with nectar-thick liquids, water protocol, Mod A  Mod I  tolerance, use of strategies, trials of upgraded textures/liquids    ADL's   Min A overall  Supervision overall  modified bathing/desssing, vision/field cut, activity tolerance   Mobility   min assist overall  supervision overall, ambulatory limited community distances with LRAD  gait training, orthotic consult, NMR, safety awareness and compensator strategies.    Communication   Mod-Max A  Min A  use of speech intelligibility goals    Safety/Cognition/ Behavioral Observations  Min-Mod A  Supervision  recall, memory, attention   Pain   No complain of pain  <2    Assess and treat pain q shift and as needed   Skin   Ingrowth left great toe.  no new skin breakdown/infection.  Assess skin q shift and as needed    Rehab Goals Patient on target to meet rehab goals: Yes Rehab Goals Revised: none *See Care Plan and progress notes for long and short-term goals.     Barriers to Discharge  Current Status/Progress Possible Resolutions Date Resolved   Physician    Inaccessible home environment;Decreased caregiver support;Behavior     initiating program  Cont rehab, discuss baseline with family      Nursing                  PT                    OT                  SLP                SW                Discharge Planning/Teaching Needs:  Pt to return to the extended stay motel where pt and son are staying with son to provide supervision level care.  Son to come for family education closer to d/c.   Team Discussion:  Pt has had several previous strokes and has a left field cut from one of them with little to no compensation for that.  His right side is weak now.  Pt's memory is poor.  Blood pressure is well controlled.  Pt does well with everyday tasks and has only occasional loss of balance.  Pt's diet was downgraded by ST to D1 due to coughing with D2.  Pt need two swallows and this is hard when he tires and is exhausting.  His speech intelligibility is poor.  Pt is 150' gait with RW and min A.  Lack of safety awareness is poor.  Revisions to Treatment Plan:  none    Continued Need for Acute Rehabilitation Level of Care: The patient requires daily medical management by a physician with specialized training in physical medicine and rehabilitation for the following conditions: Daily direction of a multidisciplinary physical rehabilitation program to ensure safe treatment while eliciting the highest outcome that is of practical value to the patient.: Yes Daily medical management of patient stability for increased activity during participation in an  intensive rehabilitation regime.: Yes Daily analysis of laboratory values and/or radiology reports with any subsequent need for medication adjustment of medical intervention for : Neurological problems;Mood/behavior problems  Venba Zenner, [place]ck 07/04/2017, 1:08 PM

## 2017-07-04 NOTE — Progress Notes (Signed)
Speech Language Pathology Daily Session Note  Patient Details  Name: Charles Hatfield MRN: [id number] Date of Birth: 1941-04-25  Today's Date: 07/04/2017 SLP Individual Time: 1140-1210 SLP Individual Time Calculation (min): 30 min  Short Term Goals: Week 1: SLP Short Term Goal 1 (Week 1): Pt will consume therapeutic trials of thin liquids with minimal overt s/s of aspiration and supervision cues for use of swallowing precautions.   SLP Short Term Goal 2 (Week 1): Pt will consume dys 2 textures and nectar thick liquids with supervision cues for use of swallowing precautions and minimal overt s/s of aspiration.   SLP Short Term Goal 3 (Week 1): Pt will slow rate, overarticulate, and increase vocal intensity to achieve intelligibility at the phrase level with mod assist.  SLP Short Term Goal 4 (Week 1): Pt will complete basic, familiar tasks with min assist for problem solving.   SLP Short Term Goal 5 (Week 1): Pt will selectively attend to basic tasks for 15 minutes with min verbal cues for redirection.   SLP Short Term Goal 6 (Week 1): Pt will recall daily information with mod cues for use of external aids.    Skilled Therapeutic Interventions: Skilled treatment session focused on speech and dysphagia goals. SLP facilitated session by providing overall Mod A verbal cues for use of speech intelligibility strategies at the phrase level to achieve ~75% intelligibility. Patient also consumed current diet of Dys. 1 textures with nectar-thick liquids with prolonged AP transit, moderate anterior spillage and Max A verbal cues for use of multiple swallows resulting in intermittent overt s/s of aspiration. Recommend patient continue current diet with close supervision to maximize safety. Patient handed off to NT. Continue with current plan of care.      Function:  Eating Eating   Modified Consistency Diet: Yes Eating Assist Level: Set up assist for;Supervision or verbal cues;Helper scoops food on  utensil   Eating Set Up Assist For: Opening containers       Cognition Comprehension Comprehension assist level: Understands basic 75 - 89% of the time/ requires cueing 10 - 24% of the time  Expression   Expression assist level: Expresses basic 25 - 49% of the time/requires cueing 50 - 75% of the time. Uses single words/gestures.  Social Interaction Social Interaction assist level: Interacts appropriately 50 - 74% of the time - May be physically or verbally inappropriate.  Problem Solving Problem solving assist level: Solves basic 25 - 49% of the time - needs direction more than half the time to initiate, plan or complete simple activities  Memory Memory assist level: Recognizes or recalls 25 - 49% of the time/requires cueing 50 - 75% of the time    Pain No/Denies Pain   Therapy/Group: Individual Therapy  Charles Hatfield 07/04/2017, 3:42 PM

## 2017-07-04 NOTE — Progress Notes (Addendum)
Subjective/Complaints: Pt doesn't remember me from yesterday, occ cough,  Review of systems difficult to obtain secondary to poor attention as well as significant dysarthria Objective: Vital Signs: Blood pressure (!) 134/53, pulse 65, temperature 97.7 F (36.5 C), temperature source Oral, resp. rate 16, height '5\' 6"'$  (1.676 m), weight 59.4 kg (130 lb 15.3 oz), SpO2 100 %. No results found. Results for orders placed or performed during the hospital encounter of 06/28/17 (from the past 72 hour(s))  Protime-INR     Status: Abnormal   Collection Time: 07/02/17  7:32 AM  Result Value Ref Range   Prothrombin Time 28.0 (H) 11.4 - 15.2 seconds   INR 2.64   Protime-INR     Status: Abnormal   Collection Time: 07/03/17  4:50 AM  Result Value Ref Range   Prothrombin Time 32.2 (H) 11.4 - 15.2 seconds   INR 3.17      HEENT: Atraumatic normocephalic Cardio: irregular and Normal rate Resp: CTA B/L and Unlabored GI: BS positive and Nontender nondistended Extremity:  Pulses positive and No Edema Skin:   Intact Neuro: Confused, Abnormal Sensory Difficult to assess secondary to poor attention to task, Abnormal Motor 4- in the right deltoid bicep tricep grip hip flexion knee extension ankle dorsiflexion and Abnormal FMC Ataxic/ dec FMC Left neglect/field cut Musc/Skel:  Other No pain with upper limb or lower limb range of motion General no acute distress   Assessment/Plan: 1. Functional deficits secondary to Right side weakness secondary to previous left pontine infarct with dysarthria secondary to acute ischemic nonhemorrhagic infarct involving the posterior limb right internal capsule   which require 3+ hours per day of interdisciplinary therapy in a comprehensive inpatient rehab setting. Physiatrist is providing close team supervision and 24 hour management of active medical problems listed below. Physiatrist and rehab team continue to assess barriers to discharge/monitor patient progress toward  functional and medical goals. FIM: Function - Bathing Position: Shower Body parts bathed by patient: Right arm, Left upper leg, Right lower leg, Left arm, Abdomen, Chest, Left lower leg, Front perineal area, Buttocks, Right upper leg Body parts bathed by helper: Back Assist Level: Touching or steadying assistance(Pt > 75%)  Function- Upper Body Dressing/Undressing What is the patient wearing?: Pull over shirt/dress Pull over shirt/dress - Perfomed by patient: Thread/unthread right sleeve, Thread/unthread left sleeve, Put head through opening, Pull shirt over trunk Assist Level: Set up Set up : To obtain clothing/put away Function - Lower Body Dressing/Undressing What is the patient wearing?: Non-skid slipper socks Position: Sitting EOB Pants- Performed by patient: Thread/unthread right pants leg, Thread/unthread left pants leg, Pull pants up/down, Fasten/unfasten pants Non-skid slipper socks- Performed by patient: Don/doff right sock, Don/doff left sock(per report) Assist for footwear: Supervision/touching assist Assist for lower body dressing: Set up  Function - Toileting Toileting steps completed by patient: Adjust clothing prior to toileting Toileting steps completed by helper: Adjust clothing prior to toileting, Performs perineal hygiene, Adjust clothing after toileting Toileting Assistive Devices: Grab bar or rail Assist level: Touching or steadying assistance (Pt.75%)  Function - Air cabin crew transfer assistive device: Elevated toilet seat/BSC over toilet Assist level to toilet: Moderate assist (Pt 50 - 74%/lift or lower) Assist level from toilet: Moderate assist (Pt 50 - 74%/lift or lower)  Function - Chair/bed transfer Chair/bed transfer method: Ambulatory Chair/bed transfer assist level: Touching or steadying assistance (Pt > 75%) Chair/bed transfer assistive device: Armrests, Walker Chair/bed transfer details: Verbal cues for technique, Verbal cues for  precautions/safety, Verbal cues for safe  use of DME/AE  Function - Locomotion: Wheelchair Will patient use wheelchair at discharge?: (tbd) Type: Manual Max wheelchair distance: 150 Assist Level: Dependent (Pt equals 0%) Assist Level: Dependent (Pt equals 0%) Assist Level: Dependent (Pt equals 0%) Turns around,maneuvers to table,bed, and toilet,negotiates 3% grade,maneuvers on rugs and over doorsills: No Function - Locomotion: Ambulation Assistive device: Walker-rolling Max distance: 15 Assist level: Touching or steadying assistance (Pt > 75%) Assist level: Touching or steadying assistance (Pt > 75%) Walk 50 feet with 2 turns activity did not occur: Safety/medical concerns Assist level: Touching or steadying assistance (Pt > 75%) Walk 150 feet activity did not occur: Safety/medical concerns Assist level: Touching or steadying assistance (Pt > 75%) Walk 10 feet on uneven surfaces activity did not occur: Safety/medical concerns  Function - Comprehension Comprehension: Auditory Comprehension assist level: Understands basic 75 - 89% of the time/ requires cueing 10 - 24% of the time  Function - Expression Expression: Verbal Expression assist level: Expresses basic 25 - 49% of the time/requires cueing 50 - 75% of the time. Uses single words/gestures.  Function - Social Interaction Social Interaction assist level: Interacts appropriately 50 - 74% of the time - May be physically or verbally inappropriate.  Function - Problem Solving Problem solving assist level: Solves basic 25 - 49% of the time - needs direction more than half the time to initiate, plan or complete simple activities  Function - Memory Memory assist level: Recognizes or recalls 25 - 49% of the time/requires cueing 50 - 75% of the time Patient normally able to recall (first 3 days only): Current season, That he or she is in a hospital  Medical Problem List and Plan:  1. Right side weakness secondary to previous left  pontine infarct with dysarthria secondary to acute ischemic nonhemorrhagic infarct involving the posterior limb right internal capsule  Also has left field cut with neglect due to old R PCA infarct Team conference today please see physician documentation under team conference tab, met with team face-to-face to discuss problems,progress, and goals. Formulized individual treatment plan based on medical history, underlying problem and comorbidities. 2. DVT Prophylaxis/Anticoagulation: Chronic Coumadin.  History of poor compliance monitor for any bleeding episodes  3. Pain Management: Tylenol as needed  4. Mood: Provide emotional support  5. Neuropsych: This patient is capable of making decisions on his own behalf.  6. Skin/Wound Care: Routine skin checks  7. Fluids/Electrolytes/Nutrition: Routine I&O's with follow-up chemistries  8. Dysphagia. Dysphagia #1 nectar liquids. Monitor hydration with follow-up speech therapy  9. Atrial fibrillation. Cardiac rate controlled. Continue Coumadin  10. CAD/left ventricular apical thrombus/NSTEMI with CABG 2013. No chest pain noted. Continue aspirin  11. Hypertension. Monitor with increased mobility  Vitals:   07/03/17 1426 07/04/17 0500  BP: (!) 129/40 (!) 134/53  Pulse: (!) 58 65  Resp: 17 16  Temp: 98.5 F (36.9 C) 97.7 F (36.5 C)  SpO2: 100% 100%  BP controlled 1/9, no need for zestril 12. COPD with remote tobacco abuse. Continue albuterol treatments as needed appears compensated Cough ?asp vs exacerbation 13. Chronic systolic congestive heart failure. Monitor for any signs of fluid overload  No peripheral edema 14. Hyperlipidemia. Lipitor  15. Vascular dementia secondary to multi-infarcts small vessel disease   16.  Feeling of "something in Right eye"  OD non injected , no tearing, no foreign body identified on eye exam, will order NS drops  LOS (Days) 6 A FACE TO FACE EVALUATION WAS PERFORMED  Charlett Blake 07/04/2017, 7:59 AM

## 2017-07-04 NOTE — Progress Notes (Signed)
Social Work Patient ID: RIVEN MABILE, male   DOB: [birth year], 77 y.o.   MRN: [id number]     Zenith Lamphier, [name]  Social Worker    Patient Care Conference  Signed  Date of Service:  07/04/2017  1:08 PM          Signed          [] Hide copied text  [] Hover for details   Inpatient RehabilitationTeam Conference and Plan of Care Update Date: 07/04/2017   Time: 11:15 AM      Patient Name: Charles Hatfield      Medical Record Number: [id number]  Date of Birth: 1941-05-12 Sex: Male         Room/Bed: 4W13C/4W13C-01 Payor Info: Payor: MEDICARE / Plan: MEDICARE PART A AND B / Product Type: *No Product type* /     Admitting Diagnosis: CVA  Admit Date/Time:  06/28/2017  5:49 PM Admission Comments: No comment available    Primary Diagnosis:  <principal problem not specified> Principal Problem: <principal problem not specified>       Patient Active Problem List    Diagnosis Date Noted  . Embolic cerebral infarction (HCC) 06/28/2017  . Hemiparesis affecting right side as late effect of cerebrovascular accident (CVA) (HCC)    . Residual cognitive deficit as late effect of stroke    . Dysphagia as late effect of stroke    . Chronic combined systolic and diastolic CHF (congestive heart failure) (HCC)    . Benign essential HTN    . PAF (paroxysmal atrial fibrillation) (HCC)    . Intracardiac thrombosis    . Tachypnea    . Right hemiparesis (HCC) 06/25/2017  . Dysarthria 06/25/2017  . Dysphagia 06/25/2017  . CVA (cerebral vascular accident) (HCC) 06/25/2017  . Long term (current) use of anticoagulants 06/25/2017  . Mixed hyperlipidemia 06/15/2013  . CKD (chronic kidney disease) stage 3, GFR 30-59 ml/min (HCC) 03/02/2013  . Critical lower limb ischemia 02/10/2013  . Preop cardiovascular exam 12/18/2012  . Long term current use of anticoagulant therapy 09/12/2012  . Occlusion and stenosis of carotid artery without mention of cerebral infarction 02/13/2012  . S/P CABG x 3      01/23/12 01/24/2012  . S/P carotid endarterectomy   Lt, 01/23/12 01/24/2012  . Carotid arterial disease, severe LICA stenosis 01/18/2012  . History of stroke 01/18/2012  . CAD (coronary artery disease) status post CABG July 2013 01/17/2012  . COPD (chronic obstructive pulmonary disease) (HCC) 01/17/2012  . Acute renal insufficiency, SCr up to 1.43 01/17/2012  . Ischemic cardiomyopathy, EF 40-45% 2D 01/15/12 01/17/2012  . Staghorn renal calculus 01/15/2012  . Left ventricular apical thrombus following MI, NSTEMI, 01/13/12 01/15/2012  . DM (diabetes mellitus) (HCC) 01/15/2012  . NSTEMI (non-ST elevated myocardial infarction) (HCC) 01/14/2012  . HTN (hypertension) 01/14/2012  . Thrombocytopenia (HCC) 01/14/2012  . Tobacco abuse 01/14/2012  . H/O ascending aorta repair        Expected Discharge Date: Expected Discharge Date: 07/13/17   Team Members Present: Physician leading conference: Dr. Claudette Laws Social Worker Present: Staci Acosta, LCSW Nurse Present: Kennyth Arnold, RN PT Present: Midge Minium, PT OT Present: Kearney Hard, OT SLP Present: Feliberto Gottron, SLP PPS Coordinator present : Tora Duck, RN, CRRN       Current Status/Progress Goal Weekly Team Focus  Medical     cognitive deficits from multiple CVAs, Dysphagia on D1 nectars  reduce fall risk, DVT risk and recurrent CVA risk  determine chronic vs new  cognitive deficits   Bowel/Bladder     Continent of bowel/bladder with urgency. LBM 07/02/16  Patient to be able to have regular bowel/bladder function with min assist  Assess bowel/bladder function q shift and as needed   Swallow/Nutrition/ Hydration     Dys. 1 textures with nectar-thick liquids, water protocol, Mod A  Mod I  tolerance, use of strategies, trials of upgraded textures/liquids    ADL's     Min A overall  Supervision overall  modified bathing/desssing, vision/field cut, activity tolerance   Mobility     min assist overall  supervision overall, ambulatory  limited community distances with LRAD  gait training, orthotic consult, NMR, safety awareness and compensator strategies.    Communication     Mod-Max A  Min A  use of speech intelligibility goals    Safety/Cognition/ Behavioral Observations   Min-Mod A  Supervision  recall, memory, attention   Pain     No complain of pain  <2   Assess and treat pain q shift and as needed   Skin     Ingrowth left great toe.  no new skin breakdown/infection.  Assess skin q shift and as needed     Rehab Goals Patient on target to meet rehab goals: Yes Rehab Goals Revised: none *See Care Plan and progress notes for long and short-term goals.      Barriers to Discharge   Current Status/Progress Possible Resolutions Date Resolved   Physician     Inaccessible home environment;Decreased caregiver support;Behavior     initiating program  Cont rehab, discuss baseline with family      Nursing                 PT                    OT                 SLP            SW              Discharge Planning/Teaching Needs:  Pt to return to the extended stay motel where pt and son are staying with son to provide supervision level care.  Son to come for family education closer to d/c.   Team Discussion:  Pt has had several previous strokes and has a left field cut from one of them with little to no compensation for that.  His right side is weak now.  Pt's memory is poor.  Blood pressure is well controlled.  Pt does well with everyday tasks and has only occasional loss of balance.  Pt's diet was downgraded by ST to D1 due to coughing with D2.  Pt need two swallows and this is hard when he tires and is exhausting.  His speech intelligibility is poor.  Pt is 150' gait with RW and min A.  Lack of safety awareness is poor.  Revisions to Treatment Plan:  none    Continued Need for Acute Rehabilitation Level of Care: The patient requires daily medical management by a physician with specialized training in physical  medicine and rehabilitation for the following conditions: Daily direction of a multidisciplinary physical rehabilitation program to ensure safe treatment while eliciting the highest outcome that is of practical value to the patient.: Yes Daily medical management of patient stability for increased activity during participation in an intensive rehabilitation regime.: Yes Daily analysis of laboratory values and/or radiology reports with any subsequent need  for medication adjustment of medical intervention for : Neurological problems;Mood/behavior problems   Kimoni Pagliarulo, [place] Deck 07/04/2017, 1:08 PM

## 2017-07-04 NOTE — Progress Notes (Signed)
ANTICOAGULATION CONSULT NOTE  Pharmacy Consult for warfarin Indication: atrial fibrillation  No Known Allergies  Patient Measurements: Height: 5\' 6"  (167.6 cm) Weight: 127 lb 3.3 oz (57.7 kg) IBW/kg (Calculated) : 63.8   Vital Signs: Temp: 97.8 F (36.6 C) (01/09 1415) Temp Source: Oral (01/09 1415) BP: 149/54 (01/09 1415) Pulse Rate: 68 (01/09 1415)  Labs: Recent Labs    07/02/17 0732 07/03/17 0450 07/04/17 0857  LABPROT 28.0* 32.2* 26.7*  INR 2.64 3.17 2.48    Estimated Creatinine Clearance: 42.4 mL/min (by C-G formula based on SCr of 1.21 mg/dL).   Assessment: 89 yoM with PMH Afib, severe PAD, prior CVA, CAD sp CABG, L CEA, HTN, prior aortobifemoral bypass, admitted for stroke Sx but not a TPA candidate. Pt supposed to be taking warfarin for AFib but does not appear to be PTA, and INR ~1.0. Txfer to [facility] for neuro workup; Neuro OK to have pharmacy resume warfarin while admitted, no bridging needed. No bleed documented.  INR 2.48 this morning, no bleeding per chart   Goal of Therapy: INR 2-3  Plan: Warfarin 2.5 mg PO x 1 Daily INR Monitor cbc, for signs of bleeding  Bayard Hugger, PharmD, BCPS  Clinical Pharmacist  Pager: [phone]  07/04/2017 3:14 PM

## 2017-07-04 NOTE — Progress Notes (Addendum)
Physical Therapy Note  Patient Details  Name: Charles Hatfield MRN: [id number] Date of Birth: 1940/10/20 Today's Date: 07/04/2017  tx 1:  1048-1135,  47 min individual tx Pain: none per pt  Pt donned socks and shoes in sitting in w/c with mod/max assist. Pt stated he needed to urinate, in standing with urinal.  Mod assist for balance as pt leaned L, and used bil hands on clothing mgt.  PT handed pt the urinal.  Continent of urine; a few drops went onto floor and walker bag, in front of him. Seated neuromuscular re-education via multimodal cues for R/L hip flex, long arc quad knee ext, bil hip abduction against resistance band.  RUE table top task in sitting , to turn playing cards over.  Low vision limited him; he touched and ID'd that there were 5 cards, missing the 6th on the R, and was unable to monitor how many he had turned over (3/6).     Gait training with RAFO, RW, with min assist to stand, mod VCs for safe use of RW, x 25' over level tile.  Pt unable to see bright orange cone PT had placed in armchair for visual target.  Pt was able to correctly stated he had walked 25' when given choice of 25' or 50'.  Pt stated his R eye hurt, and he felt something was in it.  Misty Stanley, RN informed.  Pt left resting in w/c with quick release belt applied and all needs within reach.  tx 2:  1520-1615, 55 min individual tx Pain: none per pt  tx started and ended late due to pt being gone for chest Xray.  Bed mobility with extra time, VCs for sitting up EOB.  Pt donned shoes (RAFO as well) with mod assist.  Pt stated he needed to use toilet.  Toilet transfer for continent voiding.  Tactile and verbal cues for pt to find grab bars, toilet.  PT assisted pt with buttoning pants.  neuromuscular re-education for sustained stretch bil heel cords and hamstrings, standing on wedge x 2-3 minutes during slight lateral R><L wt shifts. Seated R ankle PF/DF with multimodal cues, without AFO.  Gait training with RAFO with  grocery cart as AD, shoe cover on toe of shoe, on level tile with min assist. R foot with fair clearance.  Pt left resting in w/c with quick release belt applied and all needs within reach.  See function navigator for current status.  Dalya Maselli 07/04/2017, 11:19 AM

## 2017-07-05 ENCOUNTER — Inpatient Hospital Stay (HOSPITAL_COMMUNITY): Payer: Federal, State, Local not specified - PPO | Admitting: Physical Therapy

## 2017-07-05 ENCOUNTER — Inpatient Hospital Stay (HOSPITAL_COMMUNITY): Payer: Federal, State, Local not specified - PPO | Admitting: Occupational Therapy

## 2017-07-05 ENCOUNTER — Inpatient Hospital Stay (HOSPITAL_COMMUNITY): Payer: Medicare Other | Admitting: Speech Pathology

## 2017-07-05 DIAGNOSIS — I10 Essential (primary) hypertension: Secondary | ICD-10-CM

## 2017-07-05 LAB — PROTIME-INR
INR: 2.52
Prothrombin Time: 27 seconds — ABNORMAL HIGH (ref 11.4–15.2)

## 2017-07-05 MED ORDER — WARFARIN SODIUM 2.5 MG PO TABS
2.5000 mg | ORAL_TABLET | Freq: Every day | ORAL | Status: DC
  Administered 2017-07-05: 2.5 mg via ORAL
  Filled 2017-07-05 (×2): qty 1

## 2017-07-05 NOTE — Progress Notes (Signed)
Subjective/Complaints: RIght eye feels better No issues overnite, no cough or SOB, doesn't remember me Review of systems difficult to obtain secondary to poor attention as well as significant dysarthria Objective: Vital Signs: Blood pressure (!) 125/57, pulse 71, temperature 97.9 F (36.6 C), temperature source Oral, resp. rate 16, height 5\' 6"  (1.676 m), weight 57.7 kg (127 lb 3.3 oz), SpO2 97 %. Dg Chest 2 View  Result Date: 07/04/2017 CLINICAL DATA:  77 year old male with cough EXAM: CHEST  2 VIEW COMPARISON:  06/27/2017 and prior radiographs FINDINGS: Cardiomegaly and CABG changes again noted. Mild peribronchial thickening is unchanged. There is no evidence of focal airspace disease, pulmonary edema, suspicious pulmonary nodule/mass, pleural effusion, or pneumothorax. No acute bony abnormalities are identified. IMPRESSION: Cardiomegaly without evidence of acute cardiopulmonary disease. Electronically Signed   By: Harmon Pier M.D.   On: 07/04/2017 15:16   Results for orders placed or performed during the hospital encounter of 06/28/17 (from the past 72 hour(s))  Protime-INR     Status: Abnormal   Collection Time: 07/03/17  4:50 AM  Result Value Ref Range   Prothrombin Time 32.2 (H) 11.4 - 15.2 seconds   INR 3.17   Protime-INR     Status: Abnormal   Collection Time: 07/04/17  8:57 AM  Result Value Ref Range   Prothrombin Time 26.7 (H) 11.4 - 15.2 seconds   INR 2.48      HEENT: Atraumatic normocephalic Cardio: irregular and Normal rate Resp: CTA B/L and Unlabored GI: BS positive and Nontender nondistended Extremity:  Pulses positive and No Edema Skin:   Intact Neuro: Confused, Abnormal Sensory Difficult to assess secondary to poor attention to task, Abnormal Motor 4- in the right deltoid bicep tricep grip hip flexion knee extension ankle dorsiflexion and Abnormal FMC Ataxic/ dec FMC Left neglect/field cut Musc/Skel:  Other No pain with upper limb or lower limb range of  motion General no acute distress   Assessment/Plan: 1. Functional deficits secondary to Right side weakness secondary to previous left pontine infarct with dysarthria secondary to acute ischemic nonhemorrhagic infarct involving the posterior limb right internal capsule   which require 3+ hours per day of interdisciplinary therapy in a comprehensive inpatient rehab setting. Physiatrist is providing close team supervision and 24 hour management of active medical problems listed below. Physiatrist and rehab team continue to assess barriers to discharge/monitor patient progress toward functional and medical goals. FIM: Function - Bathing Position: Shower Body parts bathed by patient: Right arm, Left upper leg, Right lower leg, Left arm, Abdomen, Chest, Left lower leg, Front perineal area, Buttocks, Right upper leg Body parts bathed by helper: Back Assist Level: Touching or steadying assistance(Pt > 75%)  Function- Upper Body Dressing/Undressing What is the patient wearing?: Pull over shirt/dress Pull over shirt/dress - Perfomed by patient: Thread/unthread right sleeve, Thread/unthread left sleeve, Put head through opening, Pull shirt over trunk Assist Level: Set up Set up : To obtain clothing/put away Function - Lower Body Dressing/Undressing What is the patient wearing?: Non-skid slipper socks Position: Sitting EOB Pants- Performed by patient: Thread/unthread right pants leg, Thread/unthread left pants leg, Pull pants up/down, Fasten/unfasten pants Non-skid slipper socks- Performed by patient: Don/doff right sock, Don/doff left sock(per report) Assist for footwear: Supervision/touching assist Assist for lower body dressing: Set up  Function - Toileting Toileting steps completed by patient: Adjust clothing prior to toileting Toileting steps completed by helper: Adjust clothing prior to toileting, Performs perineal hygiene, Adjust clothing after toileting Toileting Assistive Devices: Grab  bar or  rail Assist level: Touching or steadying assistance (Pt.75%)  Function - Archivist transfer assistive device: Grab bar Assist level to toilet: Moderate assist (Pt 50 - 74%/lift or lower) Assist level from toilet: Moderate assist (Pt 50 - 74%/lift or lower)  Function - Chair/bed transfer Chair/bed transfer method: Ambulatory Chair/bed transfer assist level: Touching or steadying assistance (Pt > 75%) Chair/bed transfer assistive device: Armrests, Walker Chair/bed transfer details: Verbal cues for technique, Verbal cues for precautions/safety, Verbal cues for safe use of DME/AE  Function - Locomotion: Wheelchair Will patient use wheelchair at discharge?: (tbd) Type: Manual Max wheelchair distance: 150 Assist Level: Dependent (Pt equals 0%) Assist Level: Dependent (Pt equals 0%) Assist Level: Dependent (Pt equals 0%) Turns around,maneuvers to table,bed, and toilet,negotiates 3% grade,maneuvers on rugs and over doorsills: No Function - Locomotion: Ambulation Assistive device: Walker-rolling Max distance: 25 Assist level: Touching or steadying assistance (Pt > 75%) Assist level: Touching or steadying assistance (Pt > 75%) Walk 50 feet with 2 turns activity did not occur: Safety/medical concerns Assist level: Touching or steadying assistance (Pt > 75%) Walk 150 feet activity did not occur: Safety/medical concerns Assist level: Touching or steadying assistance (Pt > 75%) Walk 10 feet on uneven surfaces activity did not occur: Safety/medical concerns  Function - Comprehension Comprehension: Auditory Comprehension assist level: Understands basic 75 - 89% of the time/ requires cueing 10 - 24% of the time  Function - Expression Expression: Verbal Expression assist level: Expresses basic 25 - 49% of the time/requires cueing 50 - 75% of the time. Uses single words/gestures.  Function - Social Interaction Social Interaction assist level: Interacts appropriately 50 -  74% of the time - May be physically or verbally inappropriate.  Function - Problem Solving Problem solving assist level: Solves basic 25 - 49% of the time - needs direction more than half the time to initiate, plan or complete simple activities  Function - Memory Memory assist level: Recognizes or recalls 25 - 49% of the time/requires cueing 50 - 75% of the time Patient normally able to recall (first 3 days only): Current season, That he or she is in a hospital  Medical Problem List and Plan:  1. 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 DVT Prophylaxis/Anticoagulation: Chronic Coumadin.  History of poor compliance monitor for any bleeding episodes  3. Pain Management: Tylenol as needed  4. Mood: Provide emotional support  5. Neuropsych: This patient is capable of making decisions on his own behalf.  6. Skin/Wound Care: Routine skin checks  7. Fluids/Electrolytes/Nutrition: Routine I&O's with follow-up chemistries  8. Dysphagia. Dysphagia #1 nectar liquids. Monitor hydration with follow-up speech therapy  9. Atrial fibrillation. Cardiac rate controlled. Continue Coumadin  10. CAD/left ventricular apical thrombus/NSTEMI with CABG 2013. No chest pain noted. Continue aspirin  11. Hypertension. Monitor with increased mobility  Vitals:   07/04/17 1415 07/05/17 0500  BP: (!) 149/54 (!) 125/57  Pulse: 68 71  Resp: 17 16  Temp: 97.8 F (36.6 C) 97.9 F (36.6 C)  SpO2: 99% 97%  BP controlled 1/10, no need for zestril 12. COPD with remote tobacco abuse. Continue albuterol treatments as needed appears compensated Cough- likely related to dysphagia CXR neg 13. Chronic systolic congestive heart failure. Monitor for any signs of fluid overload  No peripheral  edema, CXR without pulm edema 14. Hyperlipidemia. Lipitor  15. Vascular dementia secondary to multi-infarcts small vessel disease     LOS (Days) 7 A FACE TO FACE EVALUATION WAS PERFORMED  [name] 07/05/2017, 7:43 AM

## 2017-07-05 NOTE — Progress Notes (Signed)
Social Work Patient ID: GADGE GRGURICH, male   DOB: 02-Oct-1940, 77 y.o.   MRN: [id number]   CSW was able to speak with pt's son about team conference. He is very grateful for care on CIR and pt's improvement.  He plans to attend ST session tomorrow, as he has many questions for ST.  He has been through PT and OT sessions.  He knows that team is recommending 24/7 supervision and he plans to be with pt.  He is hoping to eventually resume his work schedule at night when pt is asleep.  CSW told him that unless pt does not need to get up at night, we would recommend he be with them at night, as well.  CSW will continue to assist pt/son.

## 2017-07-05 NOTE — Progress Notes (Signed)
Physical Therapy Session Note  Patient Details  Name: [name] MRN: [id number] Date of Birth: 09-Sep-1940  Today's Date: 07/05/2017 PT Individual Time: 1400-1530 PT Individual Time Calculation (min): 90 min   Short Term Goals: Week 1:  PT Short Term Goal 1 (Week 1): Pt will ambulate 36' with LRAD and mod assist PT Short Term Goal 2 (Week 1): Pt will negotiate 12 steps with min assist PT Short Term Goal 3 (Week 1): Pt will complete BERG Balance Scale  Skilled Therapeutic Interventions/Progress Updates:    Pt supine in bed, agreeable to participate in therapy session. Pt reports no pain this PM. Sit to stand with CGA/Min Assist to RW, v/c for safe placement of UE during transfer. Ambulation 2 x 45' with RW and Mod Assist, ataxic and scissoring gait pattern. Pt ambulates with very narrow BOS and trips over his feet even with verbal cues to increase BOS. Nustep level 2 x 10 min with B UE/LE for strengthening and coordination. Supine BLE therex x 10 reps: hip abd with knees bent/straight, heel slides, SLR, bridges. Standing alternating L/R 4" step taps, alternating L/R 4" step-ups with BUE support and Mod Assist for balance. Pt reports feeling very fatigued this PM and requires frequent rest breaks between therapeutic activities. Pt left supine in bed with needs in reach, bed alarm in place.   Therapy Documentation Precautions:  Precautions Precautions: Fall Precaution Comments: profound visual deficits Restrictions Weight Bearing Restrictions: No  See Function Navigator for Current Functional Status.   Therapy/Group: Individual Therapy  Peter Congo, PT, DPT  07/05/2017, 3:39 PM

## 2017-07-05 NOTE — Progress Notes (Signed)
Speech Language Pathology Daily Session Note  Patient Details  Name: Charles Hatfield MRN: [id number] Date of Birth: Jun 02, 1941  Today's Date: 07/05/2017 SLP Individual Time: 0906-1000 SLP Individual Time Calculation (min): 54 min  Short Term Goals: Week 1: SLP Short Term Goal 1 (Week 1): Pt will consume therapeutic trials of thin liquids with minimal overt s/s of aspiration and supervision cues for use of swallowing precautions.   SLP Short Term Goal 2 (Week 1): Pt will consume dys 2 textures and nectar thick liquids with supervision cues for use of swallowing precautions and minimal overt s/s of aspiration.   SLP Short Term Goal 3 (Week 1): Pt will slow rate, overarticulate, and increase vocal intensity to achieve intelligibility at the phrase level with mod assist.  SLP Short Term Goal 4 (Week 1): Pt will complete basic, familiar tasks with min assist for problem solving.   SLP Short Term Goal 5 (Week 1): Pt will selectively attend to basic tasks for 15 minutes with min verbal cues for redirection.   SLP Short Term Goal 6 (Week 1): Pt will recall daily information with mod cues for use of external aids.    Skilled Therapeutic Interventions: Skilled treatment session focused on cognitive and dysphagia goals. Upon arrival, patient had been incontinent of urine and required Mod A verbal cues and extra time for problem solving during basic self-care tasks. Patient also consumed nectar thick liquids with overt s/s of aspiration and Mod A verbal cues for use of swallowing compensatory strategies. Patient left upright in wheelchair with NT present. Continue with current plan of care.      Function:  Eating Eating   Modified Consistency Diet: Yes Eating Assist Level: Set up assist for;Supervision or verbal cues   Eating Set Up Assist For: Opening containers       Cognition Comprehension Comprehension assist level: Understands basic 75 - 89% of the time/ requires cueing 10 - 24% of the time   Expression   Expression assist level: Expresses basic 50 - 74% of the time/requires cueing 25 - 49% of the time. Needs to repeat parts of sentences.  Social Interaction Social Interaction assist level: Interacts appropriately 50 - 74% of the time - May be physically or verbally inappropriate.  Problem Solving Problem solving assist level: Solves basic 50 - 74% of the time/requires cueing 25 - 49% of the time  Memory Memory assist level: Recognizes or recalls 50 - 74% of the time/requires cueing 25 - 49% of the time    Pain No/Denies Pain   Therapy/Group: Individual Therapy  Tinnie Kunin 07/05/2017, 4:34 PM

## 2017-07-05 NOTE — Progress Notes (Signed)
ANTICOAGULATION CONSULT NOTE  Pharmacy Consult for warfarin Indication: atrial fibrillation  No Known Allergies  Patient Measurements: Height: 5\' 6"  (167.6 cm) Weight: 127 lb 3.3 oz (57.7 kg) IBW/kg (Calculated) : 63.8   Vital Signs: Temp: 97.9 F (36.6 C) (01/10 0500) Temp Source: Oral (01/10 0500) BP: 125/57 (01/10 0500) Pulse Rate: 71 (01/10 0500)  Labs: Recent Labs    07/03/17 0450 07/04/17 0857 07/05/17 0730  LABPROT 32.2* 26.7* 27.0*  INR 3.17 2.48 2.52    Estimated Creatinine Clearance: 42.4 mL/min (by C-G formula based on SCr of 1.21 mg/dL).   Assessment: 31 yoM with PMH Afib, severe PAD, prior CVA, CAD sp CABG, L CEA, HTN, prior aortobifemoral bypass, admitted for stroke Sx but not a TPA candidate. Pt supposed to be taking warfarin for AFib but does not appear to be PTA, and INR ~1.0. Txfer to [facility] for neuro workup; Neuro OK to have pharmacy resume warfarin while admitted, no bridging needed. No bleed documented.  INR 2.52 this morning, no bleeding per chart   Goal of Therapy: INR 2-3  Plan: Warfarin 2.5 mg PO x 1 Daily INR Monitor cbc, for signs of bleeding  Bayard Hugger, PharmD, BCPS  Clinical Pharmacist  Pager: [phone]  07/05/2017 1:46 PM

## 2017-07-05 NOTE — Progress Notes (Signed)
Occupational Therapy Session Note  Patient Details  Name: JANZEN SACKS MRN: [id number] Date of Birth: 09/08/1940  Today's Date: 07/05/2017 OT Individual Time: 1105-1200 OT Individual Time Calculation (min): 55 min    Short Term Goals: Week 1:  OT Short Term Goal 1 (Week 1): Pt will stand at sink wiht CGA to groom for 2/2 items to demo improved endurance OT Short Term Goal 2 (Week 1): Pt will bathe will locate 3/3 items on sink while grooming/bathing with no more than 1 VC OT Short Term Goal 3 (Week 1): Pt will transfer complete clothing managment for toileting wiht CGA OT Short Term Goal 4 (Week 1): Pt will transfer into shower wiht CGA  Skilled Therapeutic Interventions/Progress Updates:    OT treatment session focused on modified dressing and vision. Pt greeted seated in bathroom and agreeable to OT. Pt completed toileting seated on commode, then stood to pull up pants with 50% close supervision and intermittent CGA for posterior sway. Pt ambulated into room with RW and supervision/intermittent CGA. Set-up A for clothing, then pt able to don shirt and thread pant legs. Min guard A for balance when pulling up pants. Pt brought to dynavision room. Worked on head turns and visual compensatory strategies for R field-cut. With practice and instructional cues, pt able to locate red lights on R side. Pt returned to room at end of session and left with needs met.   Therapy Documentation Precautions:  Precautions Precautions: Fall Precaution Comments: profound visual deficits Restrictions Weight Bearing Restrictions: No Pain:  none/denies pain  See Function Navigator for Current Functional Status.   Therapy/Group: Individual Therapy  [name] 07/05/2017, 12:17 PM

## 2017-07-06 ENCOUNTER — Inpatient Hospital Stay (HOSPITAL_COMMUNITY): Payer: Federal, State, Local not specified - PPO | Admitting: Occupational Therapy

## 2017-07-06 ENCOUNTER — Inpatient Hospital Stay (HOSPITAL_COMMUNITY): Payer: Federal, State, Local not specified - PPO | Admitting: Physical Therapy

## 2017-07-06 ENCOUNTER — Inpatient Hospital Stay (HOSPITAL_COMMUNITY): Payer: Medicare Other | Admitting: Speech Pathology

## 2017-07-06 LAB — BASIC METABOLIC PANEL
Anion gap: 10 (ref 5–15)
BUN: 26 mg/dL — ABNORMAL HIGH (ref 6–20)
CALCIUM: 9.7 mg/dL (ref 8.9–10.3)
CO2: 27 mmol/L (ref 22–32)
CREATININE: 1.15 mg/dL (ref 0.61–1.24)
Chloride: 105 mmol/L (ref 101–111)
GFR, EST NON AFRICAN AMERICAN: 60 mL/min — AB (ref >60)
GLUCOSE: 112 mg/dL — AB (ref 65–99)
Potassium: 4.1 mmol/L (ref 3.5–5.1)
Sodium: 142 mmol/L (ref 135–145)

## 2017-07-06 LAB — PROTIME-INR
INR: 2
PROTHROMBIN TIME: 22.5 s — AB (ref 11.4–15.2)

## 2017-07-06 MED ORDER — SENNOSIDES-DOCUSATE SODIUM 8.6-50 MG PO TABS
1.0000 | ORAL_TABLET | Freq: Two times a day (BID) | ORAL | Status: DC
  Administered 2017-07-06 – 2017-07-13 (×15): 1 via ORAL
  Filled 2017-07-06 (×15): qty 1

## 2017-07-06 MED ORDER — WARFARIN SODIUM 5 MG PO TABS
5.0000 mg | ORAL_TABLET | Freq: Once | ORAL | Status: AC
  Administered 2017-07-06: 5 mg via ORAL
  Filled 2017-07-06: qty 1

## 2017-07-06 MED ORDER — POLYETHYLENE GLYCOL 3350 17 G PO PACK
17.0000 g | PACK | Freq: Every day | ORAL | Status: DC
  Administered 2017-07-06 – 2017-07-13 (×8): 17 g via ORAL
  Filled 2017-07-06 (×8): qty 1

## 2017-07-06 MED ORDER — NICOTINE 7 MG/24HR TD PT24
7.0000 mg | MEDICATED_PATCH | Freq: Every day | TRANSDERMAL | Status: DC
  Administered 2017-07-06 – 2017-07-13 (×8): 7 mg via TRANSDERMAL
  Filled 2017-07-06 (×8): qty 1

## 2017-07-06 NOTE — Progress Notes (Signed)
Speech Language Pathology Weekly Progress and Session Note  Patient Details  Name: Charles Hatfield MRN: [id number] Date of Birth: 1941-04-11  Beginning of progress report period: June 28, 2017 End of progress report period: July 06, 2017  Today's Date: 07/06/2017 SLP Individual Time: 1130-1200 SLP Individual Time Calculation (min): 30 min  Missed Time: 30 minutes, patient ill   Short Term Goals: Week 1: SLP Short Term Goal 1 (Week 1): Pt will consume therapeutic trials of thin liquids with minimal overt s/s of aspiration and supervision cues for use of swallowing precautions.   SLP Short Term Goal 1 - Progress (Week 1): Not met SLP Short Term Goal 2 (Week 1): Pt will consume dys 2 textures and nectar thick liquids with supervision cues for use of swallowing precautions and minimal overt s/s of aspiration.   SLP Short Term Goal 2 - Progress (Week 1): Not met SLP Short Term Goal 3 (Week 1): Pt will slow rate, overarticulate, and increase vocal intensity to achieve intelligibility at the phrase level with mod assist.  SLP Short Term Goal 3 - Progress (Week 1): Met SLP Short Term Goal 4 (Week 1): Pt will complete basic, familiar tasks with min assist for problem solving.   SLP Short Term Goal 4 - Progress (Week 1): Not met SLP Short Term Goal 5 (Week 1): Pt will selectively attend to basic tasks for 15 minutes with min verbal cues for redirection.   SLP Short Term Goal 5 - Progress (Week 1): Met SLP Short Term Goal 6 (Week 1): Pt will recall daily information with mod cues for use of external aids.   SLP Short Term Goal 6 - Progress (Week 1): Met    New Short Term Goals: Week 2: SLP Short Term Goal 1 (Week 2): Patient will utilize a slow rate, overarticulation, and an increased vocal intensity to achieve ~75% intelligibility at the phrase level with min assist verbal cues.  SLP Short Term Goal 2 (Week 2): Patient will consume dys 2 textures and nectar thick liquids with Min verbal cues  for use of swallowing precautions with minimal overt s/s of aspiration.   SLP Short Term Goal 3 (Week 2): Patient will consume therapeutic trials of thin liquids with minimal overt s/s of aspiration and Min verbal cues for use of swallowing precautions over 3 sessions prior to upgrade.   SLP Short Term Goal 4 (Week 2): Pt will complete basic, familiar tasks with min assist verbal cues for problem solving.   SLP Short Term Goal 5 (Week 2): Patient will recall daily information with Min verbal and visual cues for use of external aids.   SLP Short Term Goal 6 (Week 2): Pt will selectively attend to basic tasks for 15 minutes with supervision verbal cues for redirection.    Weekly Progress Updates: Patient has made minimal and inconsistent gains this reporting period. Currently, patient is consuming Dys. 1 textures with thin liquids with continued overt s/s of aspiration and Mod A verbal cues needed for use of swallowing compensatory strategies. Patient also continues to demonstrate intermittent decreased management of secretions with Mod A verbal cues needed for use of speech intelligibility strategies at the phrase level. Patient's function also impacted by intermittent lethargy with continued deficits noted in recall and attention. Patient education ongoing. Patient would benefit from continued skilled SLP intervention to maximize his cognitive, speech and swallowing function as well as his overall functional independence prior to discharge.      Intensity: Minumum of 1-2 x/day,  30 to 90 minutes Frequency: 3 to 5 out of 7 days Duration/Length of Stay: 07/13/17 Treatment/Interventions: Cognitive remediation/compensation;Cueing hierarchy;Dysphagia/aspiration precaution training;Environmental controls;Internal/external aids;Functional tasks;Patient/family education   Daily Session  Skilled Therapeutic Interventions:  Patient missed initial 30 minutes of session due to reports of feeling ill. RN aware  and reported patient was constipated. After third attempt from SLP, patient agreeable to participate. Patient consumed lunch meal of Dys. 1 textures with nectar-thick liquids with overt s/s of aspiration, suspect due to prolonged AP transit and suspected delay in swallow initiation. Patient required Mod A verbal cues for use of multiple swallows throughout session. Patient verbalizing wants/needs at the phrase and sentence level with 75% intelligibility and Min A verbal cues for use of strategies. Patient left upright in wheelchair with all needs within reach and quick release belt in place. Continue with current plan of care.     Function:   Eating Eating   Modified Consistency Diet: Yes Eating Assist Level: Set up assist for;Supervision or verbal cues;Hand over hand assist   Eating Set Up Assist For: Opening containers       Cognition Comprehension Comprehension assist level: Understands basic 75 - 89% of the time/ requires cueing 10 - 24% of the time  Expression   Expression assist level: Expresses basic 50 - 74% of the time/requires cueing 25 - 49% of the time. Needs to repeat parts of sentences.  Social Interaction Social Interaction assist level: Interacts appropriately 50 - 74% of the time - May be physically or verbally inappropriate.  Problem Solving Problem solving assist level: Solves basic 50 - 74% of the time/requires cueing 25 - 49% of the time  Memory Memory assist level: Recognizes or recalls 50 - 74% of the time/requires cueing 25 - 49% of the time   Pain No/Denies Pain   Therapy/Group: Individual Therapy  Charles Hatfield 07/06/2017, 3:40 PM

## 2017-07-06 NOTE — Progress Notes (Signed)
Subjective/Complaints: No issues overnite, shaved and had haircut.  DIscussed smoking cessation Review of systems difficult to obtain secondary to poor attention as well as significant dysarthria Objective: Vital Signs: Blood pressure 127/60, pulse 76, temperature 97.9 F (36.6 C), temperature source Oral, resp. rate 18, height 5\' 6"  (1.676 m), weight 57.7 kg (127 lb 3.3 oz), SpO2 98 %. Dg Chest 2 View  Result Date: 07/04/2017 CLINICAL DATA:  77 year old male with cough EXAM: CHEST  2 VIEW COMPARISON:  06/27/2017 and prior radiographs FINDINGS: Cardiomegaly and CABG changes again noted. Mild peribronchial thickening is unchanged. There is no evidence of focal airspace disease, pulmonary edema, suspicious pulmonary nodule/mass, pleural effusion, or pneumothorax. No acute bony abnormalities are identified. IMPRESSION: Cardiomegaly without evidence of acute cardiopulmonary disease. Electronically Signed   By: Harmon Pier M.D.   On: 07/04/2017 15:16   Results for orders placed or performed during the hospital encounter of 06/28/17 (from the past 72 hour(s))  Protime-INR     Status: Abnormal   Collection Time: 07/04/17  8:57 AM  Result Value Ref Range   Prothrombin Time 26.7 (H) 11.4 - 15.2 seconds   INR 2.48   Protime-INR     Status: Abnormal   Collection Time: 07/05/17  7:30 AM  Result Value Ref Range   Prothrombin Time 27.0 (H) 11.4 - 15.2 seconds   INR 2.52      HEENT: Atraumatic normocephalic Cardio: irregular and Normal rate Resp: CTA B/L and Unlabored GI: BS positive and Nontender nondistended Extremity:  Pulses positive and No Edema Skin:   Intact Neuro: oriented to person , place time, Abnormal Sensory Difficult to assess secondary to poor attention to task, Abnormal Motor 4- in the right deltoid bicep tricep grip hip flexion knee extension ankle dorsiflexion and Abnormal FMC Ataxic/ dec FMC Left neglect/field cut Musc/Skel:  Other No pain with upper limb or lower limb range of  motion General no acute distress   Assessment/Plan: 1. Functional deficits secondary to Right side weakness secondary to previous left pontine infarct with dysarthria secondary to acute ischemic nonhemorrhagic infarct involving the posterior limb right internal capsule   which require 3+ hours per day of interdisciplinary therapy in a comprehensive inpatient rehab setting. Physiatrist is providing close team supervision and 24 hour management of active medical problems listed below. Physiatrist and rehab team continue to assess barriers to discharge/monitor patient progress toward functional and medical goals. FIM: Function - Bathing Position: Shower Body parts bathed by patient: Right arm, Left upper leg, Right lower leg, Left arm, Abdomen, Chest, Left lower leg, Front perineal area, Buttocks, Right upper leg Body parts bathed by helper: Back Assist Level: Touching or steadying assistance(Pt > 75%)  Function- Upper Body Dressing/Undressing What is the patient wearing?: Pull over shirt/dress Pull over shirt/dress - Perfomed by patient: Thread/unthread right sleeve, Thread/unthread left sleeve, Put head through opening, Pull shirt over trunk Assist Level: Set up Set up : To obtain clothing/put away Function - Lower Body Dressing/Undressing What is the patient wearing?: Non-skid slipper socks Position: Sitting EOB Pants- Performed by patient: Thread/unthread right pants leg, Thread/unthread left pants leg, Pull pants up/down, Fasten/unfasten pants Non-skid slipper socks- Performed by patient: Don/doff right sock, Don/doff left sock(per report) Assist for footwear: Supervision/touching assist Assist for lower body dressing: Set up  Function - Toileting Toileting steps completed by patient: Adjust clothing prior to toileting Toileting steps completed by helper: Adjust clothing prior to toileting, Performs perineal hygiene, Adjust clothing after toileting Toileting Assistive Devices: Grab  bar  or rail Assist level: Touching or steadying assistance (Pt.75%)  Function - Archivist transfer assistive device: Grab bar Assist level to toilet: Moderate assist (Pt 50 - 74%/lift or lower) Assist level from toilet: Moderate assist (Pt 50 - 74%/lift or lower)  Function - Chair/bed transfer Chair/bed transfer method: Ambulatory Chair/bed transfer assist level: Touching or steadying assistance (Pt > 75%) Chair/bed transfer assistive device: Armrests, Walker Chair/bed transfer details: Verbal cues for technique, Verbal cues for precautions/safety, Verbal cues for safe use of DME/AE  Function - Locomotion: Wheelchair Will patient use wheelchair at discharge?: (tbd) Type: Manual Max wheelchair distance: 150 Assist Level: Dependent (Pt equals 0%) Assist Level: Dependent (Pt equals 0%) Assist Level: Dependent (Pt equals 0%) Turns around,maneuvers to table,bed, and toilet,negotiates 3% grade,maneuvers on rugs and over doorsills: No Function - Locomotion: Ambulation Assistive device: Walker-rolling Max distance: 70' Assist level: Touching or steadying assistance (Pt > 75%) Assist level: Touching or steadying assistance (Pt > 75%) Walk 50 feet with 2 turns activity did not occur: Safety/medical concerns Assist level: Touching or steadying assistance (Pt > 75%) Walk 150 feet activity did not occur: Safety/medical concerns Assist level: Touching or steadying assistance (Pt > 75%) Walk 10 feet on uneven surfaces activity did not occur: Safety/medical concerns  Function - Comprehension Comprehension: Auditory Comprehension assist level: Understands basic 75 - 89% of the time/ requires cueing 10 - 24% of the time  Function - Expression Expression: Verbal Expression assist level: Expresses basic 50 - 74% of the time/requires cueing 25 - 49% of the time. Needs to repeat parts of sentences.  Function - Social Interaction Social Interaction assist level: Interacts  appropriately 50 - 74% of the time - May be physically or verbally inappropriate.  Function - Problem Solving Problem solving assist level: Solves basic 50 - 74% of the time/requires cueing 25 - 49% of the time  Function - Memory Memory assist level: Recognizes or recalls 50 - 74% of the time/requires cueing 25 - 49% of the time Patient normally able to recall (first 3 days only): Current season, That he or she is in a hospital  Medical Problem List and Plan:  1. 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 DVT Prophylaxis/Anticoagulation: Chronic Coumadin.  History of poor compliance monitor for any bleeding episodes  3. Pain Management: Tylenol as needed  4. Mood: Provide emotional support  5. Neuropsych: This patient is capable of making decisions on his own behalf.  6. Skin/Wound Care: Routine skin checks  7. Fluids/Electrolytes/Nutrition: Routine I&O's with follow-up chemistries I: in 1/10, check BMET 8. Dysphagia. Dysphagia #1 nectar liquids. Monitor hydration with follow-up speech therapy  9. Atrial fibrillation. Cardiac rate controlled. Continue Coumadin  10. CAD/left ventricular apical thrombus/NSTEMI with CABG 2013. No chest pain noted. Continue aspirin  11. Hypertension. Monitor with increased mobility  Vitals:   07/05/17 0500 07/05/17 1500  BP: (!) 125/57 127/60  Pulse: 71 76  Resp: 16 18  Temp: 97.9 F (36.6 C) 97.9 F (36.6 C)  SpO2: 97% 98%  BP controlled 1/11, no need for zestril 12. COPD with remote tobacco abuse. Continue albuterol treatments as needed appears compensated Cough- likely related to dysphagia CXR neg 13. Chronic systolic congestive heart failure. Monitor for any signs of fluid overload  No peripheral edema,  CXR  without pulm edema 14. Hyperlipidemia. Lipitor  15. Vascular dementia secondary to multi-infarcts small vessel disease - improved orientation today, acute effects of CVA may be dimishing    LOS (Days) 8 A FACE TO FACE EVALUATION WAS PERFORMED  [name] 07/06/2017, 6:32 AM

## 2017-07-06 NOTE — Progress Notes (Signed)
Occupational Therapy Weekly Progress Note  Patient Details  Name: Charles Hatfield MRN: [id number] Date of Birth: 1941-01-09  Beginning of progress report period: June 29, 2017 End of progress report period: July 06, 2017  Today's Date: 07/06/2017 OT Individual Time: 1901-2224 OT Individual Time Calculation (min): 70 min   Patient has met 4 of 4 short term goals. Pt is making steady progress with OT treatments at this time.  He is able to complete all transfers with use of the RW and min guard A.    Supervision for sit to to stand with min instructional cueing for hand placement.  Pt's main limitation is low vision and low activity tolerance. Will continue with current OT treatment POC.    Patient continues to demonstrate the following deficits: muscle weakness, impaired timing and sequencing, decreased coordination and decreased motor planning, field cut, decreased attention to right and decreased standing balance, decreased postural control, hemiplegia and decreased balance strategies and therefore will continue to benefit from skilled OT intervention to enhance overall performance with BADL and Reduce care partner burden.  Patient progressing toward long term goals..  Continue plan of care.  OT Short Term Goals Week 1:  OT Short Term Goal 1 (Week 1): Pt will stand at sink wiht CGA to groom for 2/2 items to demo improved endurance OT Short Term Goal 1 - Progress (Week 1): Met OT Short Term Goal 2 (Week 1): Pt will bathe will locate 3/3 items on sink while grooming/bathing with no more than 1 VC OT Short Term Goal 2 - Progress (Week 1): Met OT Short Term Goal 3 (Week 1): Pt will transfer complete clothing managment for toileting wiht CGA OT Short Term Goal 3 - Progress (Week 1): Met OT Short Term Goal 4 (Week 1): Pt will transfer into shower wiht CGA OT Short Term Goal 4 - Progress (Week 1): Met Week 2:   STG=LTG 2/2 ELOS  Skilled Therapeutic Interventions/Progress Updates:    B UE  coordination with clothes folding task. Pt needed set-up A for orientation of clothing. Also worked on sitting balance seated EOB while folding clothes. Pt with occasional posterior lean requiring vebal and tactile cues to anteior weight  Pt ambulated into bathroom to transfer onto shower bench with instructional cues to locate tub bench. Worked on head turns for visual compensation strategy to locate bathing and grooming items throughout session. Overall min A for BADL tasks . Standing balance and coordination with dancing to pt preferred 50's blues. Pt needed min A for dynamic stepping. Pt left seated in wc with safety belt on and needs met.   Therapy Documentation Precautions:  Precautions Precautions: Fall Precaution Comments: profound visual deficits Restrictions Weight Bearing Restrictions: No Pain: Pain Assessment Pain Assessment: No/denies pain  See Function Navigator for Current Functional Status.   Therapy/Group: Individual Therapy  [name] 07/06/2017, 1:02 PM

## 2017-07-06 NOTE — Progress Notes (Signed)
Physical Therapy Weekly Progress Note  Patient Details  Name: Charles Hatfield MRN: [id number] Date of Birth: 09-28-1940  Beginning of progress report period: June 29, 2017 End of progress report period: July 06, 2017  Today's Date: 07/06/2017 PT Individual Time: 0802-0900 PT Individual Time Calculation (min): 58 min   Patient has met 3 of 3 short term goals.  Pt currently functioning at min assist level overall for gait, transfers, and bed mobility with use of RW. Hemiplegia, ataxia, visual impairments, and pain in the L foot are this pt's greatest limiting factors, but pt is making steady progress towards long term goals.   Patient continues to demonstrate the following deficits muscle weakness, decreased cardiorespiratoy endurance, motor apraxia, ataxia, decreased coordination and decreased motor planning, decreased visual acuity, decreased visual perceptual skills and decreased visual motor skills, decreased awareness, decreased problem solving, decreased safety awareness, decreased memory and delayed processing and decreased standing balance, decreased postural control, hemiplegia and decreased balance strategies and therefore will continue to benefit from skilled PT intervention to increase functional independence with mobility.  Patient progressing toward long term goals..  Continue plan of care.  PT Short Term Goals Week 1:  PT Short Term Goal 1 (Week 1): Pt will ambulate 84' with LRAD and mod assist PT Short Term Goal 1 - Progress (Week 1): Met PT Short Term Goal 2 (Week 1): Pt will negotiate 12 steps with min assist PT Short Term Goal 2 - Progress (Week 1): Met PT Short Term Goal 3 (Week 1): Pt will complete BERG Balance Scale PT Short Term Goal 3 - Progress (Week 1): Met Week 2:  PT Short Term Goal 1 (Week 2): STG =LTG due to ELOS  Skilled Therapeutic Interventions/Progress Updates:   Pt received sitting in WC and agreeable to PT. Pt transported to rehab gym in [facility] for time  management and energy conservation. Gait training instructed by PT with RW and min assist x 164f. Min cues from PT for awareness of obstacles on the R and L and improved step width to increase stability. Cues for increased step width to improve stability. PT instructed in Berg balance test. Patient demonstrates increased fall risk as noted by score of   24/56 on Berg Balance Scale.  (<36= high risk for falls, close to 100%; 37-45 significant >80%; 46-51 moderate >50%; 52-55 lower >25%) PT instructed pt in Stair negotiation x 12 with min assist from PT. Moderate cues for UE placement due to visual deficits, pt noted to require increased time and effort with each step. [facility] met.          Therapy Documentation Precautions:  Precautions Precautions: Fall Precaution Comments: profound visual deficits Restrictions Weight Bearing Restrictions: No Pain: 0/10   See Function Navigator for Current Functional Status.  Therapy/Group: Individual Therapy  ALorie Phenix1/04/2018, 9:04 AM

## 2017-07-06 NOTE — Progress Notes (Signed)
ANTICOAGULATION CONSULT NOTE  Pharmacy Consult for warfarin Indication: atrial fibrillation  No Known Allergies  Patient Measurements: Height: 5\' 6"  (167.6 cm) Weight: 127 lb 3.3 oz (57.7 kg) IBW/kg (Calculated) : 63.8   Vital Signs: Temp: 97.3 F (36.3 C) (01/11 1420) Temp Source: Oral (01/11 1420) BP: 136/50 (01/11 1420) Pulse Rate: 69 (01/11 1420)  Labs: Recent Labs    07/04/17 0857 07/05/17 0730 07/06/17 0727  LABPROT 26.7* 27.0* 22.5*  INR 2.48 2.52 2.00  CREATININE  --   --  1.15    Estimated Creatinine Clearance: 44.6 mL/min (by C-G formula based on SCr of 1.15 mg/dL).   Assessment: 22 yoM with PMH Afib, severe PAD, prior CVA, CAD sp CABG, L CEA, HTN, prior aortobifemoral bypass, admitted for stroke Sx but not a TPA candidate. Pt supposed to be taking warfarin for AFib but does not appear to be PTA, and INR ~1.0. Txfer to [facility] for neuro workup; Neuro OK to have pharmacy resume warfarin while admitted, no bridging needed. No bleed documented.  INR 2.00 this morning, no bleeding per chart   Goal of Therapy: INR 2-3  Plan: Warfarin 5 mg PO x 1 Likely requires 5 mg once or twice a week, 2.5 mg all other days. Daily INR Monitor cbc, for signs of bleeding  Bayard Hugger, PharmD, BCPS  Clinical Pharmacist  Pager: [phone]  07/06/2017 2:24 PM

## 2017-07-07 ENCOUNTER — Inpatient Hospital Stay (HOSPITAL_COMMUNITY): Payer: Federal, State, Local not specified - PPO

## 2017-07-07 DIAGNOSIS — F0150 Vascular dementia without behavioral disturbance: Secondary | ICD-10-CM

## 2017-07-07 DIAGNOSIS — I5022 Chronic systolic (congestive) heart failure: Secondary | ICD-10-CM

## 2017-07-07 LAB — PROTIME-INR
INR: 2.01
Prothrombin Time: 22.6 seconds — ABNORMAL HIGH (ref 11.4–15.2)

## 2017-07-07 MED ORDER — WARFARIN SODIUM 2.5 MG PO TABS
2.5000 mg | ORAL_TABLET | Freq: Once | ORAL | Status: AC
  Administered 2017-07-07: 2.5 mg via ORAL
  Filled 2017-07-07: qty 1

## 2017-07-07 NOTE — Progress Notes (Signed)
ANTICOAGULATION CONSULT NOTE  Pharmacy Consult for Warfarin Indication: atrial fibrillation  No Known Allergies  Patient Measurements: Height: 5\' 6"  (167.6 cm) Weight: 127 lb 3.3 oz (57.7 kg) IBW/kg (Calculated) : 63.8  Vital Signs: Temp: 97.9 F (36.6 C) (01/12 0524) Temp Source: Oral (01/12 0524) BP: 108/45 (01/12 0524) Pulse Rate: 62 (01/12 0524)  Labs: Recent Labs    07/05/17 0730 07/06/17 0727 07/07/17 0509  LABPROT 27.0* 22.5* 22.6*  INR 2.52 2.00 2.01  CREATININE  --  1.15  --    Estimated Creatinine Clearance: 44.6 mL/min (by C-G formula based on SCr of 1.15 mg/dL).  Assessment: 87 yoM with PMH afib, prior CVA, admitted for stroke sx but not a TPA candidate. Patient supposed to be taking warfarin PTA for afib but does not appear to be, INR ~1.0 on admit. Per neuro ok to resume warfarin while admitted, no bridging needed. INR within goal range at 2.01 this AM. Last CBC 1/4 WNL. No bleeding noted.  Goal of Therapy: INR 2-3  Plan: Warfarin 2.5 mg PO x 1 Likely requires 5 mg once or twice a week, 2.5 mg all other days. Daily INR Monitor for s/sx bleeding  Jadarrius Maselli N. Zigmund Daniel, PharmD PGY1 Pharmacy Resident Pager: [phone] 07/07/2017 8:19 AM

## 2017-07-07 NOTE — Progress Notes (Signed)
Subjective/Complaints: Patient seen lying in bed this morning. He states he slept well overnight.  Review of systems: Limited, but appears to denies CP, SOB, nausea, vomiting, diarrhea.  Objective: Vital Signs: Blood pressure (!) 108/45, pulse 62, temperature 97.9 F (36.6 C), temperature source Oral, resp. rate 18, height '5\' 6"'$  (1.676 m), weight 57.7 kg (127 lb 3.3 oz), SpO2 97 %. No results found. Results for orders placed or performed during the hospital encounter of 06/28/17 (from the past 72 hour(s))  Protime-INR     Status: Abnormal   Collection Time: 07/04/17  8:57 AM  Result Value Ref Range   Prothrombin Time 26.7 (H) 11.4 - 15.2 seconds   INR 2.48   Protime-INR     Status: Abnormal   Collection Time: 07/05/17  7:30 AM  Result Value Ref Range   Prothrombin Time 27.0 (H) 11.4 - 15.2 seconds   INR 2.52   Protime-INR     Status: Abnormal   Collection Time: 07/06/17  7:27 AM  Result Value Ref Range   Prothrombin Time 22.5 (H) 11.4 - 15.2 seconds   INR 1.44   Basic metabolic panel     Status: Abnormal   Collection Time: 07/06/17  7:27 AM  Result Value Ref Range   Sodium 142 135 - 145 mmol/L   Potassium 4.1 3.5 - 5.1 mmol/L   Chloride 105 101 - 111 mmol/L   CO2 27 22 - 32 mmol/L   Glucose, Bld 112 (H) 65 - 99 mg/dL   BUN 26 (H) 6 - 20 mg/dL   Creatinine, Ser 1.15 0.61 - 1.24 mg/dL   Calcium 9.7 8.9 - 10.3 mg/dL   GFR calc non Af Amer 60 (L) >60 mL/min   GFR calc Af Amer >60 >60 mL/min    Comment: (NOTE) The eGFR has been calculated using the CKD EPI equation. This calculation has not been validated in all clinical situations. eGFR's persistently <60 mL/min signify possible Chronic Kidney Disease.    Anion gap 10 5 - 15  Protime-INR     Status: Abnormal   Collection Time: 07/07/17  5:09 AM  Result Value Ref Range   Prothrombin Time 22.6 (H) 11.4 - 15.2 seconds   INR 2.01      HEENT: Atraumatic normocephalic Cardio: Irregularly irregular. No JVD. Resp: CTA B/L  and unlabored GI: BS positive and Nontender nondistended Skin:   Intact. Warm and dry Neuro: Alert  Motor:  4-/5 in the right deltoid bicep tricep grip hip flexion knee extension ankle dorsiflexion  5/5 RUE/RLE:  Musc/Skel:  No edema. No tenderness. General no acute distress. Vital signs reviewed.   Assessment/Plan: 1. Functional deficits secondary to Right side weakness secondary to previous left pontine infarct with dysarthria secondary to acute ischemic nonhemorrhagic infarct involving the posterior limb right internal capsule   which require 3+ hours per day of interdisciplinary therapy in a comprehensive inpatient rehab setting. Physiatrist is providing close team supervision and 24 hour management of active medical problems listed below. Physiatrist and rehab team continue to assess barriers to discharge/monitor patient progress toward functional and medical goals. FIM: Function - Bathing Position: Shower Body parts bathed by patient: Right arm, Left upper leg, Right lower leg, Left arm, Abdomen, Chest, Left lower leg, Front perineal area, Right upper leg Body parts bathed by helper: Buttocks, Back Assist Level: Touching or steadying assistance(Pt > 75%)  Function- Upper Body Dressing/Undressing What is the patient wearing?: Pull over shirt/dress Pull over shirt/dress - Perfomed by patient: Thread/unthread right  sleeve, Thread/unthread left sleeve, Pull shirt over trunk, Put head through opening Assist Level: Set up Set up : To obtain clothing/put away Function - Lower Body Dressing/Undressing What is the patient wearing?: Non-skid slipper socks, Pants, Underwear Position: Wheelchair/chair at sink Underwear - Performed by patient: Thread/unthread right underwear leg, Thread/unthread left underwear leg Underwear - Performed by helper: Pull underwear up/down Pants- Performed by patient: Thread/unthread right pants leg, Thread/unthread left pants leg Pants- Performed by helper:  Pull pants up/down Non-skid slipper socks- Performed by patient: Don/doff right sock, Don/doff left sock Assist for footwear: Supervision/touching assist Assist for lower body dressing: Touching or steadying assistance (Pt > 75%)  Function - Toileting Toileting steps completed by patient: Performs perineal hygiene Toileting steps completed by helper: Adjust clothing prior to toileting, Adjust clothing after toileting Toileting Assistive Devices: Grab bar or rail Assist level: Touching or steadying assistance (Pt.75%)  Function - Air cabin crew transfer assistive device: Grab bar Assist level to toilet: Touching or steadying assistance (Pt > 75%) Assist level from toilet: Touching or steadying assistance (Pt > 75%)  Function - Chair/bed transfer Chair/bed transfer method: Stand pivot Chair/bed transfer assist level: Touching or steadying assistance (Pt > 75%) Chair/bed transfer assistive device: Walker, Armrests Chair/bed transfer details: Verbal cues for technique, Verbal cues for precautions/safety, Verbal cues for safe use of DME/AE  Function - Locomotion: Wheelchair Will patient use wheelchair at discharge?: (tbd) Type: Manual Max wheelchair distance: 150 Assist Level: Dependent (Pt equals 0%) Assist Level: Dependent (Pt equals 0%) Assist Level: Dependent (Pt equals 0%) Turns around,maneuvers to table,bed, and toilet,negotiates 3% grade,maneuvers on rugs and over doorsills: No Function - Locomotion: Ambulation Assistive device: Walker-rolling Max distance: 171f Assist level: Touching or steadying assistance (Pt > 75%) Assist level: Touching or steadying assistance (Pt > 75%) Walk 50 feet with 2 turns activity did not occur: Safety/medical concerns Assist level: Touching or steadying assistance (Pt > 75%) Walk 150 feet activity did not occur: Safety/medical concerns Assist level: Touching or steadying assistance (Pt > 75%) Walk 10 feet on uneven surfaces activity  did not occur: Safety/medical concerns  Function - Comprehension Comprehension: Auditory Comprehension assist level: Understands basic 75 - 89% of the time/ requires cueing 10 - 24% of the time  Function - Expression Expression: Verbal Expression assist level: Expresses basic 50 - 74% of the time/requires cueing 25 - 49% of the time. Needs to repeat parts of sentences.  Function - Social Interaction Social Interaction assist level: Interacts appropriately 50 - 74% of the time - May be physically or verbally inappropriate.  Function - Problem Solving Problem solving assist level: Solves basic 50 - 74% of the time/requires cueing 25 - 49% of the time  Function - Memory Memory assist level: Recognizes or recalls 50 - 74% of the time/requires cueing 25 - 49% of the time Patient normally able to recall (first 3 days only): Current season, That he or she is in a hospital  Medical Problem List and Plan:  1. 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 DVT Prophylaxis/Anticoagulation: Chronic Coumadin.  History of poor compliance monitor for any bleeding episodes    INR therapeutic on 1/12 3. Pain Management: Tylenol as needed  4. Mood: Provide emotional support  5.  Neuropsych: This patient is not fully capable of making decisions on his own behalf.  6. Skin/Wound Care: Routine skin checks  7. Fluids/Electrolytes/Nutrition: Routine I&O's    BMET stable on 1/11 8. Dysphagia. Dysphagia #1 nectar liquids. Monitor hydration with follow-up speech therapy  9. Atrial fibrillation. Cardiac rate controlled. Continue Coumadin  10. CAD/left ventricular apical thrombus/NSTEMI with CABG 2013. No chest pain noted. Continue aspirin  11.  Hypertension. Monitor with increased mobility  Vitals:   07/06/17 1420 07/07/17 0524  BP: (!) 136/50 (!) 108/45  Pulse: 69 62  Resp: 17 18  Temp: (!) 97.3 F (36.3 C) 97.9 F (36.6 C)  SpO2: 98% 97%    BP controlled/low on 1/12, currently not on any hypertensive meds 12. COPD with remote tobacco abuse. Continue albuterol treatments as needed appears compensated Cough- likely related to dysphagia CXR neg 13. Chronic systolic congestive heart failure. Monitor for any signs of fluid overload  Filed Weights   06/28/17 1820 07/04/17 0803  Weight: 59.4 kg (130 lb 15.3 oz) 57.7 kg (127 lb 3.3 oz)  No peripheral edema, CXR without pulm edema 14. Hyperlipidemia. Lipitor  15. Vascular dementia secondary to multi-infarcts small vessel disease  LOS (Days) 9 A FACE TO FACE EVALUATION WAS PERFORMED  Ankit Lorie Phenix 07/07/2017, 8:43 AM

## 2017-07-07 NOTE — Progress Notes (Addendum)
Speech Language Pathology Daily Session Note  Patient Details  Name: Charles Hatfield MRN: [id number] Date of Birth: 10-Apr-1941  Today's Date: 07/07/2017 SLP Individual Time: 1200-1215 SLP Individual Time Calculation (min): 15 min  Short Term Goals: Week 2: SLP Short Term Goal 1 (Week 2): Patient will utilize a slow rate, overarticulation, and an increased vocal intensity to achieve ~75% intelligibility at the phrase level with min assist verbal cues.  SLP Short Term Goal 2 (Week 2): Patient will consume dys 2 textures and nectar thick liquids with Min verbal cues for use of swallowing precautions with minimal overt s/s of aspiration.   SLP Short Term Goal 3 (Week 2): Patient will consume therapeutic trials of thin liquids with minimal overt s/s of aspiration and Min verbal cues for use of swallowing precautions over 3 sessions prior to upgrade.   SLP Short Term Goal 4 (Week 2): Pt will complete basic, familiar tasks with min assist verbal cues for problem solving.   SLP Short Term Goal 5 (Week 2): Patient will recall daily information with Min verbal and visual cues for use of external aids.   SLP Short Term Goal 6 (Week 2): Pt will selectively attend to basic tasks for 15 minutes with supervision verbal cues for redirection.    Skilled Therapeutic Interventions: Skilled treatment session focused on dysphagia goals. Son was present and requested to speak with SLP. He missed previous dates appt with SLP d/t unforeseen work conflict.   Upon entering the room, son provided the following information: pt had severe swallow deficits following previous CVA (~ 9 years ago) - at discharge pt had to use chin tuck when consuming liquids at time of discharged and used it for "years following discharged" to prevent coughing. Son stated that pt's swallow issues were so bad from previous CVA that son requested swallow evaluation at admission to ED when this acute CVA was diagnosed because he (son) "knew it  (swallowing) was going to be an issue."  This SLP reviewed pt's chart and administered ice chips via spoon as well as thin water via spoon. Pt with significantly delayed swallow initiation and continuous lingual pumping felt by palpation. With ice chips, pt with no laryngeal movement for ~ 5 seconds before movement felt. With thin liquids via spoon, pt with no laryngeal movement x 2 with 3 trials followed by delayed wet coughing (7-8 seconds with no swallow then wet coughing).   The following information was provided to son: - education provided on extremely high aspiration risk and detailed explanation of aspiration that occurred during this session and deficit/function of delayed swallow initiation (I.e., explained what was not happening with pt's swallow musculature during swallow of thin liquids this session) - pt has made no appreciable CONSTANT progress with thin liquids or advanced diet textures, therefore the prognosis of discharging on dysphagia 2/3 or thin liquids was very poor (son expressed that he expected this - given severely of previous CVA) - per chart review pt continues to demonstrate inconsistent overt s/s of aspiration with nectar thick liquids with assessment ongoing for safety - son appreciative  - Son stated that he was "ok with" providing altered diet/liquids for pt if shown how.   Given pt's high aspiration risk, water protocol will be discontinued and trials of thin water given by SLP only.      Function:  Eating Eating   Modified Consistency Diet: No(Trials of ice chips and water with SLP only) Eating Assist Level: Helper feeds patient   Eating  Set Up Assist For: Opening containers Helper Scoops Food on Utensil: Occasionally     Cognition Comprehension Comprehension assist level: Understands basic 75 - 89% of the time/ requires cueing 10 - 24% of the time  Expression   Expression assist level: Expresses basic 25 - 49% of the time/requires cueing 50 - 75% of the  time. Uses single words/gestures.  Social Interaction Social Interaction assist level: Interacts appropriately 50 - 74% of the time - May be physically or verbally inappropriate.  Problem Solving Problem solving assist level: Solves basic 50 - 74% of the time/requires cueing 25 - 49% of the time  Memory Memory assist level: Recognizes or recalls 50 - 74% of the time/requires cueing 25 - 49% of the time    Pain    Therapy/Group: Individual Therapy  Caylyn Tedeschi 07/07/2017, 12:26 PM

## 2017-07-07 NOTE — Progress Notes (Signed)
Occupational Therapy Session Note  Patient Details  Name: Charles Hatfield MRN: [id number] Date of Birth: Nov 12, 1940  Today's Date: 07/07/2017 OT Individual Time: 4373-5789 OT Individual Time Calculation (min): 26 min    Short Term Goals: Week 1:  OT Short Term Goal 1 (Week 1): Pt will stand at sink wiht CGA to groom for 2/2 items to demo improved endurance OT Short Term Goal 1 - Progress (Week 1): Met OT Short Term Goal 2 (Week 1): Pt will bathe will locate 3/3 items on sink while grooming/bathing with no more than 1 VC OT Short Term Goal 2 - Progress (Week 1): Met OT Short Term Goal 3 (Week 1): Pt will transfer complete clothing managment for toileting wiht CGA OT Short Term Goal 3 - Progress (Week 1): Met OT Short Term Goal 4 (Week 1): Pt will transfer into shower wiht CGA OT Short Term Goal 4 - Progress (Week 1): Met  Skilled Therapeutic Interventions/Progress Updates:    1:1. Pt requesting to toilet upon entering room. Pt ambualtes with CGA and decreased VC for locating toilet/bathroom/avoiding obstacles as compared to earlier session. Pt completes toileting with touching A and Vc to use grab bar to stand up instead of pulling iht B hands on walker. OT propels w/c to tx gym for energy conservation. Pt stands with RW with CGA to locate and pull off 6/8 putty balls from window for increased visual scanning and head turning practice to locate targets. Remaining 2/8 putty balls required HOH A to reach above head to locate. Exited session with p seated in bed, call light in reach and all needs met.  Therapy Documentation Precautions:  Precautions Precautions: Fall Precaution Comments: profound visual deficits Restrictions Weight Bearing Restrictions: No  See Function Navigator for Current Functional Status.   Therapy/Group: Individual Therapy  Tonny Branch 07/07/2017, 1:07 PM

## 2017-07-07 NOTE — Progress Notes (Signed)
Occupational Therapy Session Note  Patient Details  Name: Charles Hatfield MRN: [id number] Date of Birth: 1941-02-12  Today's Date: 07/07/2017 OT Individual Time: 1000-1057 OT Individual Time Calculation (min): 57 min    Short Term Goals: Week 2:  OT Short Term Goal 1 (Week 2): STG=LTG 2/2 ELOS  Skilled Therapeutic Interventions/Progress Updates:    1:1. Pt agreable to shower and dress this date. Pt ambulate into bathroom with min A and Vc for RW turning d/t low vision. Pt transfers onto toilet to void bladder and unsuccessful BM. Pt able to complete toileting steps with touching A. Pt transfers onto TTB with touching A and bathes at sit to stand level with VC for head turns to locate soap. Pt dresses at sit to stand level at sink with A to to orient clothing and balance while advancing pants past hips. Pt requires touching A this date to don L sock as pt losing balance to L with Vc for midline orientation. Exited session with pt semi reclined in bed call light inreach and all needs met.   Therapy Documentation Precautions:  Precautions Precautions: Fall Precaution Comments: profound visual deficits Restrictions Weight Bearing Restrictions: No  See Function Navigator for Current Functional Status.   Therapy/Group: Individual Therapy  Tonny Branch 07/07/2017, 10:13 AM

## 2017-07-08 ENCOUNTER — Inpatient Hospital Stay (HOSPITAL_COMMUNITY): Payer: Federal, State, Local not specified - PPO

## 2017-07-08 DIAGNOSIS — I4891 Unspecified atrial fibrillation: Secondary | ICD-10-CM

## 2017-07-08 LAB — PROTIME-INR
INR: 2.21
Prothrombin Time: 24.3 seconds — ABNORMAL HIGH (ref 11.4–15.2)

## 2017-07-08 MED ORDER — WARFARIN SODIUM 2.5 MG PO TABS
2.5000 mg | ORAL_TABLET | Freq: Once | ORAL | Status: AC
  Administered 2017-07-08: 2.5 mg via ORAL
  Filled 2017-07-08: qty 1

## 2017-07-08 NOTE — Progress Notes (Signed)
Occupational Therapy Session Note  Patient Details  Name: MACINTYRE ALEXA MRN: [id number] Date of Birth: [birth year]  Today's Date: 07/08/2017 OT Individual Time: 9290-9030 OT Individual Time Calculation (min): 41 min    Short Term Goals: Week 1:  OT Short Term Goal 1 (Week 1): Pt will stand at sink wiht CGA to groom for 2/2 items to demo improved endurance OT Short Term Goal 1 - Progress (Week 1): Met OT Short Term Goal 2 (Week 1): Pt will bathe will locate 3/3 items on sink while grooming/bathing with no more than 1 VC OT Short Term Goal 2 - Progress (Week 1): Met OT Short Term Goal 3 (Week 1): Pt will transfer complete clothing managment for toileting wiht CGA OT Short Term Goal 3 - Progress (Week 1): Met OT Short Term Goal 4 (Week 1): Pt will transfer into shower wiht CGA OT Short Term Goal 4 - Progress (Week 1): Met  Skilled Therapeutic Interventions/Progress Updates:    1:1. Pt supine>sitting EOB with supervision. Pt stand pivot transfer throughout session with CGA EOB<>w/c<>toilet and Vc for hand placement. Pt doffs/dons new shirt with VC to check for tag in back for orientation. Pt unable to orient shirt from folded, dons backwards, but able to problem solve when realize tag is in front of shirt to don correctly. Pt stands in ADL apartmetn at counter with CGA to reach for plastic fruits on first shelf and places in bucket in PNF patterns. Pt able to identify 9/15 fruits and reports, "I could tell half by sight." Pt unable to identify color. Pt reports needing to toilet and completes trnasfer and all steps with CGA and Vc for use of grab bars. Pt washes hands with auditory tapping cue to locate water faucet, soap and paper towel. Exited session with pt seated in bd, call light in reach and all needs met.  Therapy Documentation Precautions:  Precautions Precautions: Fall Precaution Comments: profound visual deficits Restrictions Weight Bearing Restrictions: No  See Function  Navigator for Current Functional Status.   Therapy/Group: Individual Therapy  Tonny Branch 07/08/2017, 2:41 PM

## 2017-07-08 NOTE — Progress Notes (Signed)
ANTICOAGULATION CONSULT NOTE  Pharmacy Consult for Warfarin Indication: atrial fibrillation  No Known Allergies  Patient Measurements: Height: 5\' 6"  (167.6 cm) Weight: 127 lb 3.3 oz (57.7 kg) IBW/kg (Calculated) : 63.8  Vital Signs: Temp: (P) 98.4 F (36.9 C) (01/13 0522) Temp Source: (P) Oral (01/13 0522) BP: (P) 114/40 (01/13 0522) Pulse Rate: (P) 59 (01/13 0522)  Labs: Recent Labs    07/06/17 0727 07/07/17 0509 07/08/17 0814  LABPROT 22.5* 22.6* 24.3*  INR 2.00 2.01 2.21  CREATININE 1.15  --   --    Estimated Creatinine Clearance: 44.6 mL/min (by C-G formula based on SCr of 1.15 mg/dL).  Assessment: 43 yoM with PMH afib, prior CVA, admitted for stroke sx but not a TPA candidate. Patient supposed to be taking warfarin PTA but does not appear to be, INR ~1.0 on admit. Per neuro ok to resume warfarin while admitted, no bridging needed. INR remains within goal range at 2.21 this AM. Last CBC 1/4 WNL. No bleeding noted.  Goal of Therapy: INR 2-3   Plan: Warfarin 2.5 mg PO x 1 Likely requires 5 mg once or twice a week, 2.5 mg all other days. Daily INR Will order CBC for tomorrow Monitor for s/sx bleeding  Farron Watrous N. Zigmund Daniel, PharmD PGY1 Pharmacy Resident Pager: [phone] 07/08/2017 10:38 AM

## 2017-07-08 NOTE — Progress Notes (Signed)
Physical Therapy Note  Patient Details  Name: Charles Hatfield MRN: [id number] Date of Birth: [birth year] Today's Date: 07/08/2017  1100-1210, 70 min individual tx Pain: none per pt  Supine neuromuscular re-education via multimodal cues for 10 x 1 each bil lower trunk rotation with slight resistance to elicit hip abductors, mdofied abdominal crunches, bil bridging.  Pt stated he needed to have BM.  Gait training in room with Rw to toilet transfer for continent voiding; no BM.  PT used VCs for distance and angles for turns to assist pt with navigating due to low vision.  Hand washing in standing at sink, with tactile cues for lever, location of soap and paper towels.    neuromuscular re-education via forced use for alternatting reciprocal movement x bil lEs on Kinetron at 30 cm/sec, seated in w/c, x 20 cycles, and LLE unilateral  hip and knee ext against resistance provided by PT x 20 cycles.  Sustained stretch L hamstrings and heel cords in sitting, foot propped on 10" high footstool, and 6# weight on thigh. Coban tape applied to L foot/ankle for L foot drop prior to gait training.    Pt fell asleep during LE passive stretching, easily aroused but lethargic.  BP in sitting = 107/44, HR 61.  Pt unable to stay awake for more than 30 seconds.  Pt assisted back to bed; shoes and Coban doffed.  Leanord Hawking, RN attending to pt.  Bed alarm set.  See function navigator for current status.  [name] 07/08/2017, 11:29 AM

## 2017-07-08 NOTE — Progress Notes (Signed)
Subjective/Complaints: Patient seen lying in bed this morning. He slept well overnight. He denies complaints this morning.  Review of systems: Limited, but appears to deny CP, SOB, nausea, vomiting, diarrhea.  Objective: Vital Signs: Blood pressure (!) (P) 114/40, pulse (!) (P) 59, temperature (P) 98.4 F (36.9 C), temperature source (P) Oral, resp. rate (P) 18, height 5' 6" (1.676 m), weight 57.7 kg (127 lb 3.3 oz), SpO2 (P) 97 %. No results found. Results for orders placed or performed during the hospital encounter of 06/28/17 (from the past 72 hour(s))  Protime-INR     Status: Abnormal   Collection Time: 07/05/17  7:30 AM  Result Value Ref Range   Prothrombin Time 27.0 (H) 11.4 - 15.2 seconds   INR 2.52   Protime-INR     Status: Abnormal   Collection Time: 07/06/17  7:27 AM  Result Value Ref Range   Prothrombin Time 22.5 (H) 11.4 - 15.2 seconds   INR 4.19   Basic metabolic panel     Status: Abnormal   Collection Time: 07/06/17  7:27 AM  Result Value Ref Range   Sodium 142 135 - 145 mmol/L   Potassium 4.1 3.5 - 5.1 mmol/L   Chloride 105 101 - 111 mmol/L   CO2 27 22 - 32 mmol/L   Glucose, Bld 112 (H) 65 - 99 mg/dL   BUN 26 (H) 6 - 20 mg/dL   Creatinine, Ser 1.15 0.61 - 1.24 mg/dL   Calcium 9.7 8.9 - 10.3 mg/dL   GFR calc non Af Amer 60 (L) >60 mL/min   GFR calc Af Amer >60 >60 mL/min    Comment: (NOTE) The eGFR has been calculated using the CKD EPI equation. This calculation has not been validated in all clinical situations. eGFR's persistently <60 mL/min signify possible Chronic Kidney Disease.    Anion gap 10 5 - 15  Protime-INR     Status: Abnormal   Collection Time: 07/07/17  5:09 AM  Result Value Ref Range   Prothrombin Time 22.6 (H) 11.4 - 15.2 seconds   INR 2.01      HEENT: Atraumatic normocephalic Cardio: Irregularly irregular. No JVD. Resp: CTA B/L and unlaboured GI: BS positive and Nontender nondistended Skin:   Intact. Warm and dry Neuro: Alert   Motor:  4-/5 in the right deltoid bicep tricep grip hip flexion knee extension ankle dorsiflexion (unchanged) Musc/Skel:  No edema. No tenderness. General no acute distress. Vital signs reviewed.   Assessment/Plan: 1. Functional deficits secondary to Right side weakness secondary to previous left pontine infarct with dysarthria secondary to acute ischemic nonhemorrhagic infarct involving the posterior limb right internal capsule   which require 3+ hours per day of interdisciplinary therapy in a comprehensive inpatient rehab setting. Physiatrist is providing close team supervision and 24 hour management of active medical problems listed below. Physiatrist and rehab team continue to assess barriers to discharge/monitor patient progress toward functional and medical goals. FIM: Function - Bathing Position: Shower Body parts bathed by patient: Right arm, Left upper leg, Right lower leg, Left arm, Abdomen, Chest, Left lower leg, Front perineal area, Right upper leg Body parts bathed by helper: Buttocks, Back Assist Level: Touching or steadying assistance(Pt > 75%)  Function- Upper Body Dressing/Undressing What is the patient wearing?: Pull over shirt/dress Pull over shirt/dress - Perfomed by patient: Thread/unthread right sleeve, Thread/unthread left sleeve, Pull shirt over trunk, Put head through opening Assist Level: Set up Set up : To obtain clothing/put away Function - Lower Body Dressing/Undressing What  is the patient wearing?: Non-skid slipper socks, Pants, Underwear Position: Wheelchair/chair at sink Underwear - Performed by patient: Thread/unthread right underwear leg, Thread/unthread left underwear leg Underwear - Performed by helper: Pull underwear up/down Pants- Performed by patient: Thread/unthread right pants leg, Thread/unthread left pants leg Pants- Performed by helper: Pull pants up/down Non-skid slipper socks- Performed by patient: Don/doff right sock, Don/doff left  sock Assist for footwear: Supervision/touching assist Assist for lower body dressing: Touching or steadying assistance (Pt > 75%)  Function - Toileting Toileting steps completed by patient: Performs perineal hygiene Toileting steps completed by helper: Adjust clothing prior to toileting, Adjust clothing after toileting Toileting Assistive Devices: Grab bar or rail Assist level: Touching or steadying assistance (Pt.75%)  Function - Toilet Transfers Toilet transfer assistive device: Grab bar Assist level to toilet: Touching or steadying assistance (Pt > 75%) Assist level from toilet: Touching or steadying assistance (Pt > 75%)  Function - Chair/bed transfer Chair/bed transfer method: Stand pivot Chair/bed transfer assist level: Touching or steadying assistance (Pt > 75%) Chair/bed transfer assistive device: Walker, Armrests Chair/bed transfer details: Verbal cues for technique, Verbal cues for precautions/safety, Verbal cues for safe use of DME/AE  Function - Locomotion: Wheelchair Will patient use wheelchair at discharge?: (tbd) Type: Manual Max wheelchair distance: 150 Assist Level: Dependent (Pt equals 0%) Assist Level: Dependent (Pt equals 0%) Assist Level: Dependent (Pt equals 0%) Turns around,maneuvers to table,bed, and toilet,negotiates 3% grade,maneuvers on rugs and over doorsills: No Function - Locomotion: Ambulation Assistive device: Walker-rolling Max distance: 135ft Assist level: Touching or steadying assistance (Pt > 75%) Assist level: Touching or steadying assistance (Pt > 75%) Walk 50 feet with 2 turns activity did not occur: Safety/medical concerns Assist level: Touching or steadying assistance (Pt > 75%) Walk 150 feet activity did not occur: Safety/medical concerns Assist level: Touching or steadying assistance (Pt > 75%) Walk 10 feet on uneven surfaces activity did not occur: Safety/medical concerns  Function - Comprehension Comprehension:  Auditory Comprehension assist level: Understands basic 75 - 89% of the time/ requires cueing 10 - 24% of the time  Function - Expression Expression: Verbal Expression assist level: Expresses basic 25 - 49% of the time/requires cueing 50 - 75% of the time. Uses single words/gestures.  Function - Social Interaction Social Interaction assist level: Interacts appropriately 50 - 74% of the time - May be physically or verbally inappropriate.  Function - Problem Solving Problem solving assist level: Solves basic 50 - 74% of the time/requires cueing 25 - 49% of the time  Function - Memory Memory assist level: Recognizes or recalls 50 - 74% of the time/requires cueing 25 - 49% of the time Patient normally able to recall (first 3 days only): Current season, That he or she is in a hospital  Medical Problem List and Plan:  1. 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 DVT Prophylaxis/Anticoagulation: Chronic Coumadin.  History of poor compliance monitor for any bleeding episodes    INR therapeutic on 1/12, pending for 1/13 3. Pain Management: Tylenol as needed  4. Mood: Provide emotional support  5. Neuropsych: This patient is not fully capable of making decisions on his own behalf.  6. Skin/Wound Care: Routine skin checks  7. Fluids/Electrolytes/Nutrition: Routine I&O's    BMET stable on 1/11 8. Dysphagia. Dysphagia #1   nectar liquids. Monitor hydration with follow-up speech therapy  9. Atrial fibrillation. Cardiac rate controlled. Continue Coumadin  10. CAD/left ventricular apical thrombus/NSTEMI with CABG 2013. No chest pain noted. Continue aspirin  11. Hypertension. Monitor with increased mobility  Vitals:   07/07/17 1425 07/08/17 0522  BP: (!) 111/50 (!) (P) 114/40  Pulse: 65  (!) (P) 59  Resp: 16 (P) 18  Temp: (!) 97.4 F (36.3 C) (P) 98.4 F (36.9 C)  SpO2: 97% (P) 97%    BP controlled on 1/13, currently not on any hypertensive meds 12. COPD with remote tobacco abuse. Continue albuterol treatments as needed appears compensated Cough- likely related to dysphagia CXR neg 13. Chronic systolic congestive heart failure. Monitor for any signs of fluid overload  Filed Weights   06/28/17 1820 07/04/17 0803  Weight: 59.4 kg (130 lb 15.3 oz) 57.7 kg (127 lb 3.3 oz)  No peripheral edema, CXR without pulm edema 14. Hyperlipidemia. Lipitor  15. Vascular dementia secondary to multi-infarcts small vessel disease  LOS (Days) 10 A FACE TO FACE EVALUATION WAS PERFORMED   Anil  07/08/2017, 7:27 AM   

## 2017-07-09 ENCOUNTER — Inpatient Hospital Stay (HOSPITAL_COMMUNITY): Payer: Federal, State, Local not specified - PPO | Admitting: Physical Therapy

## 2017-07-09 ENCOUNTER — Inpatient Hospital Stay (HOSPITAL_COMMUNITY): Payer: Medicare Other | Admitting: Speech Pathology

## 2017-07-09 ENCOUNTER — Inpatient Hospital Stay (HOSPITAL_COMMUNITY): Payer: Federal, State, Local not specified - PPO | Admitting: Occupational Therapy

## 2017-07-09 LAB — PROTIME-INR
INR: 2.4
PROTHROMBIN TIME: 25.9 s — AB (ref 11.4–15.2)

## 2017-07-09 LAB — CBC
HCT: 43.7 % (ref 39.0–52.0)
HEMOGLOBIN: 14.9 g/dL (ref 13.0–17.0)
MCH: 33.6 pg (ref 26.0–34.0)
MCHC: 34.1 g/dL (ref 30.0–36.0)
MCV: 98.4 fL (ref 78.0–100.0)
Platelets: 150 10*3/uL (ref 150–400)
RBC: 4.44 MIL/uL (ref 4.22–5.81)
RDW: 14.2 % (ref 11.5–15.5)
WBC: 7.8 10*3/uL (ref 4.0–10.5)

## 2017-07-09 MED ORDER — WARFARIN SODIUM 2.5 MG PO TABS
2.5000 mg | ORAL_TABLET | Freq: Every day | ORAL | Status: DC
  Administered 2017-07-09: 2.5 mg via ORAL
  Filled 2017-07-09: qty 1

## 2017-07-09 NOTE — Progress Notes (Signed)
Physical Therapy Session Note  Patient Details  Name: Charles Hatfield MRN: [id number] Date of Birth: Apr 25, 1941  Today's Date: 07/09/2017 PT Individual Time: 1300-1411 PT Individual Time Calculation (min): 71 min   Short Term Goals: Week 2:  PT Short Term Goal 1 (Week 2): STG =LTG due to ELOS  Skilled Therapeutic Interventions/Progress Updates:    no c/o pain.  Session focus on gait training, NMR, balance, and activity tolerance.    Pt transported to and from therapy gym total assist.  Sit<>stand throughout session with RW and supervision, stand/pivot throughout session without AD and supervision.  Gait training x100' +2x30' with RW and close supervision/min guard with mod fade to min verbal cues for increasing BOS and upright posture.  Standing toe taps to 2" step for NMR, weight shifting, and carryover for increased BOS with gait.  NMR in hook lying with hip flexion and bridges (feet supported on therapy ball), and trunk rotation 2x10 reps each.  Pt reported feeling nauseated with trunk rotation and feeling passed with return to sitting. Seated marching with 2.5# weights 2x10 bilaterally.  Pt returned to room at end of session and positioned back to bed with supervision.  Call bell in reach and needs met.   Therapy Documentation Precautions:  Precautions Precautions: Fall Precaution Comments: profound visual deficits Restrictions Weight Bearing Restrictions: No  See Function Navigator for Current Functional Status.   Therapy/Group: Individual Therapy  Michel Santee 07/09/2017, 2:12 PM

## 2017-07-09 NOTE — Plan of Care (Signed)
Patient not having BM with medication . Will continue to promote intake of water nectar thick.

## 2017-07-09 NOTE — Progress Notes (Signed)
ANTICOAGULATION CONSULT NOTE  Pharmacy Consult for Warfarin Indication: atrial fibrillation  No Known Allergies  Patient Measurements: Height: 5\' 6"  (167.6 cm) Weight: 127 lb 3.3 oz (57.7 kg) IBW/kg (Calculated) : 63.8  Vital Signs: Temp: 98 F (36.7 C) (01/14 0308) Temp Source: Oral (01/14 0308) BP: 108/50 (01/14 0308) Pulse Rate: 65 (01/14 0308)  Labs: Recent Labs    07/07/17 0509 07/08/17 0814 07/09/17 0828  HGB  --   --  14.9  HCT  --   --  43.7  PLT  --   --  150  LABPROT 22.6* 24.3* 25.9*  INR 2.01 2.21 2.40   Estimated Creatinine Clearance: 44.6 mL/min (by C-G formula based on SCr of 1.15 mg/dL).  Assessment: 40 yoM with PMH afib, prior CVA, admitted for stroke sx but not a TPA candidate. Patient supposed to be taking warfarin PTA but does not appear to be, INR ~1.0 on admit. Per neuro ok to resume warfarin while admitted, no bridging needed. INR 2.4. Hgb 14.9, pltc 150 K, stable. PO intake improved. No bleeding noted per chart.  Goal of Therapy: INR 2-3   Plan: Warfarin 2.5 mg PO daily for now Likely requires 5 mg once or twice a week, 2.5 mg all other days. Daily INR Monitor for s/sx bleeding  Bayard Hugger, PharmD, BCPS  Clinical Pharmacist  Pager: [phone]   07/09/2017 1:43 PM

## 2017-07-09 NOTE — Progress Notes (Signed)
Occupational Therapy Session Note  Patient Details  Name: Charles Hatfield MRN: [id number] Date of Birth: December 23, 1940  Today's Date: 07/09/2017 OT Individual Time: 1000-1100 OT Individual Time Calculation (min): 60 min    Short Term Goals: Week 2:  OT Short Term Goal 1 (Week 2): STG=LTG 2/2 ELOS  Skilled Therapeutic Interventions/Progress Updates:    Pt greeted seated in wc and agreeable to OT. Pt declined bathing/dressing stating he already sponge bathed and changed clothes. Pt reported need for bathroom though, and was given RW to stand with VC for hand placement. Pt ambulated into bathroom with min guard A and cues for pathfinding and object avoidance.  Pt voided bladder, with small smear of BM. With set-up A, pt able to achieve hip hike and reach behind to perform peri-care. Pt requested to don personal clothing instead of paper scrubs. Dressing completed from wc level sit<>stand with set-up A to orient all clothing, increased time for motor planning, and min guard A for balance when pulling up pants 2/2 posterior sway. Pt brought to therapy apartment and worked on visual scanning and locating objects on contrasted background. Then worked on standing balance and B UE coordination with dish washing activity. Instructional cues to locate items standing at the sink and motor plan washing technique. Pt returned to room and left seated in wc with needs met, safety belt on, and call bell in reach.   Therapy Documentation Precautions:  Precautions Precautions: Fall Precaution Comments: profound visual deficits Restrictions Weight Bearing Restrictions: No Pain:  none/denies pain  See Function Navigator for Current Functional Status.   Therapy/Group: Individual Therapy  [name] 07/09/2017, 10:17 AM

## 2017-07-09 NOTE — Progress Notes (Signed)
Subjective/Complaints: No issues overnite  Review of systems: Limited, but appears to deny CP, SOB, nausea, vomiting, diarrhea.  Objective: Vital Signs: Blood pressure (!) 108/50, pulse 65, temperature 98 F (36.7 C), temperature source Oral, resp. rate 18, height 5\' 6"  (1.676 m), weight 57.7 kg (127 lb 3.3 oz), SpO2 98 %. No results found. Results for orders placed or performed during the hospital encounter of 06/28/17 (from the past 72 hour(s))  Protime-INR     Status: Abnormal   Collection Time: 07/07/17  5:09 AM  Result Value Ref Range   Prothrombin Time 22.6 (H) 11.4 - 15.2 seconds   INR 2.01   Protime-INR     Status: Abnormal   Collection Time: 07/08/17  8:14 AM  Result Value Ref Range   Prothrombin Time 24.3 (H) 11.4 - 15.2 seconds   INR 2.21      HEENT: Atraumatic normocephalic Cardio: Irregularly irregular. No JVD. Resp: CTA B/L and unlaboured GI: BS positive and Nontender nondistended Skin:   Intact. Warm and dry Neuro: Alert  Motor:  4-/5 in the right deltoid bicep tricep grip hip flexion knee extension ankle dorsiflexion (unchanged), 5/5 on left side Musc/Skel:  No edema. No tenderness. General no acute distress. Vital signs reviewed.   Assessment/Plan: 1. Functional deficits secondary to Right side weakness secondary to previous left pontine infarct with dysarthria secondary to acute ischemic nonhemorrhagic infarct involving the posterior limb right internal capsule   which require 3+ hours per day of interdisciplinary therapy in a comprehensive inpatient rehab setting. Physiatrist is providing close team supervision and 24 hour management of active medical problems listed below. Physiatrist and rehab team continue to assess barriers to discharge/monitor patient progress toward functional and medical goals. FIM: Function - Bathing Position: Shower Body parts bathed by patient: Right arm, Left upper leg, Right lower leg, Left arm, Abdomen, Chest, Left lower leg,  Front perineal area, Right upper leg Body parts bathed by helper: Buttocks, Back Assist Level: Touching or steadying assistance(Pt > 75%)  Function- Upper Body Dressing/Undressing What is the patient wearing?: Pull over shirt/dress Pull over shirt/dress - Perfomed by patient: Thread/unthread right sleeve, Thread/unthread left sleeve, Pull shirt over trunk, Put head through opening Assist Level: Set up Set up : To obtain clothing/put away Function - Lower Body Dressing/Undressing What is the patient wearing?: Non-skid slipper socks, Pants, Underwear Position: Wheelchair/chair at sink Underwear - Performed by patient: Thread/unthread right underwear leg, Thread/unthread left underwear leg Underwear - Performed by helper: Pull underwear up/down Pants- Performed by patient: Thread/unthread right pants leg, Thread/unthread left pants leg Pants- Performed by helper: Pull pants up/down Non-skid slipper socks- Performed by patient: Don/doff right sock, Don/doff left sock Assist for footwear: Supervision/touching assist Assist for lower body dressing: Touching or steadying assistance (Pt > 75%)  Function - Toileting Toileting steps completed by patient: Adjust clothing prior to toileting, Performs perineal hygiene, Adjust clothing after toileting Toileting steps completed by helper: Adjust clothing prior to toileting, Adjust clothing after toileting Toileting Assistive Devices: Grab bar or rail Assist level: Touching or steadying assistance (Pt.75%)  Function - Archivist transfer assistive device: Grab bar Assist level to toilet: Touching or steadying assistance (Pt > 75%) Assist level from toilet: Touching or steadying assistance (Pt > 75%)  Function - Chair/bed transfer Chair/bed transfer method: Stand pivot Chair/bed transfer assist level: Touching or steadying assistance (Pt > 75%) Chair/bed transfer assistive device: Armrests, Bedrails Chair/bed transfer details: Verbal  cues for technique, Verbal cues for precautions/safety, Verbal cues for  safe use of DME/AE, Manual facilitation for weight shifting, Manual facilitation for placement  Function - Locomotion: Wheelchair Will patient use wheelchair at discharge?: (tbd) Type: Manual Max wheelchair distance: 150 Assist Level: Dependent (Pt equals 0%) Assist Level: Dependent (Pt equals 0%) Assist Level: Dependent (Pt equals 0%) Turns around,maneuvers to table,bed, and toilet,negotiates 3% grade,maneuvers on rugs and over doorsills: No Function - Locomotion: Ambulation Assistive device: Walker-rolling Max distance: 25 Assist level: Touching or steadying assistance (Pt > 75%) Assist level: Touching or steadying assistance (Pt > 75%) Walk 50 feet with 2 turns activity did not occur: Safety/medical concerns Assist level: Touching or steadying assistance (Pt > 75%) Walk 150 feet activity did not occur: Safety/medical concerns Assist level: Touching or steadying assistance (Pt > 75%) Walk 10 feet on uneven surfaces activity did not occur: Safety/medical concerns  Function - Comprehension Comprehension: Auditory Comprehension assist level: Understands basic 75 - 89% of the time/ requires cueing 10 - 24% of the time  Function - Expression Expression: Verbal Expression assist level: Expresses basic 50 - 74% of the time/requires cueing 25 - 49% of the time. Needs to repeat parts of sentences.  Function - Social Interaction Social Interaction assist level: Interacts appropriately 50 - 74% of the time - May be physically or verbally inappropriate.  Function - Problem Solving Problem solving assist level: Solves basic 50 - 74% of the time/requires cueing 25 - 49% of the time  Function - Memory Memory assist level: Recognizes or recalls 25 - 49% of the time/requires cueing 50 - 75% of the time Patient normally able to recall (first 3 days only): Current season, That he or she is in a hospital  Medical Problem  List and Plan:  1. 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 DVT Prophylaxis/Anticoagulation: Chronic Coumadin.  History of poor compliance monitor for any bleeding episodes    INR therapeutic on  1/13 3. Pain Management: Tylenol as needed  4. Mood: Provide emotional support  5. Neuropsych: This patient is not fully capable of making decisions on his own behalf.  6. Skin/Wound Care: Routine skin checks  7. Fluids/Electrolytes/Nutrition: Routine I&O's Intake   BMET stable on 1/11 8. Dysphagia. Dysphagia #1 nectar liquids. Monitor hydration with follow-up speech therapy  9. Atrial fibrillation. Cardiac rate controlled. Continue Coumadin  10. CAD/left ventricular apical thrombus/NSTEMI with CABG 2013. No chest pain noted. Continue aspirin  11. Hypertension. Monitor with increased mobility  Vitals:   07/08/17 1350 07/09/17 0308  BP: (!) 110/48 (!) 108/50  Pulse: 62 65  Resp: 18 18  Temp: 98.2 F (36.8 C) 98 F (36.7 C)  SpO2: 98% 98%    BP controlled on 1/14, currently not on any hypertensive meds 12. COPD with remote tobacco abuse. Continue albuterol treatments as needed appears compensated Cough- likely related to dysphagia CXR neg 13. Chronic systolic congestive heart failure. Monitor for any signs of fluid overload  Filed Weights   06/28/17 1820 07/04/17 0803  Weight: 59.4 kg (130 lb 15.3 oz) 57.7 kg (127 lb 3.3 oz)  No peripheral edema, weighs down 14. Hyperlipidemia. Lipitor  15. Vascular dementia secondary to multi-infarcts small vessel disease  LOS (Days) 11 A FACE TO FACE EVALUATION WAS PERFORMED  [name] 07/09/2017, 7:44 AM

## 2017-07-09 NOTE — Progress Notes (Signed)
Speech Language Pathology Daily Session Note  Patient Details  Name: Charles Hatfield MRN: [id number] Date of Birth: [birth year]  Today's Date: 07/09/2017 SLP Individual Time: 4496-7591 SLP Individual Time Calculation (min): 55 min  Short Term Goals: Week 2: SLP Short Term Goal 1 (Week 2): Patient will utilize a slow rate, overarticulation, and an increased vocal intensity to achieve ~75% intelligibility at the phrase level with min assist verbal cues.  SLP Short Term Goal 2 (Week 2): Patient will consume dys 2 textures and nectar thick liquids with Min verbal cues for use of swallowing precautions with minimal overt s/s of aspiration.   SLP Short Term Goal 3 (Week 2): Patient will consume therapeutic trials of thin liquids with minimal overt s/s of aspiration and Min verbal cues for use of swallowing precautions over 3 sessions prior to upgrade.   SLP Short Term Goal 4 (Week 2): Pt will complete basic, familiar tasks with min assist verbal cues for problem solving.   SLP Short Term Goal 5 (Week 2): Patient will recall daily information with Min verbal and visual cues for use of external aids.   SLP Short Term Goal 6 (Week 2): Pt will selectively attend to basic tasks for 15 minutes with supervision verbal cues for redirection.    Skilled Therapeutic Interventions: Skilled treatment session focused on dysphagia and speech goals. Upon arrival, patient was awake while supine in bed and was agreeable to participate in treatment session with minimal encouragement. SLP facilitated session by providing a snack of Dys. 1 textures with nectar-thick liquids. Patient consumed snack without overt s/s of aspiration but required Mod verbal cues for use of multiple swallows. A suspected delayed swallow initiation was also noted with tongue pumping and anterior spillage of saliva and liquids. Patient demonstrated increased speech intelligibility this session and required overall Mod A verbal cues for use of  intelligibility strategies at the phrase level to achieve ~90% intelligibility. Patient left upright in wheelchair with all needs within reach and quick release belt in place. Continue with current plan of care.       Function:  Eating Eating   Modified Consistency Diet: Yes Eating Assist Level: More than reasonable amount of time;Set up assist for;Supervision or verbal cues   Eating Set Up Assist For: Opening containers       Cognition Comprehension Comprehension assist level: Understands basic 75 - 89% of the time/ requires cueing 10 - 24% of the time  Expression   Expression assist level: Expresses basic 50 - 74% of the time/requires cueing 25 - 49% of the time. Needs to repeat parts of sentences.  Social Interaction Social Interaction assist level: Interacts appropriately 50 - 74% of the time - May be physically or verbally inappropriate.  Problem Solving Problem solving assist level: Solves basic 50 - 74% of the time/requires cueing 25 - 49% of the time  Memory Memory assist level: Recognizes or recalls 25 - 49% of the time/requires cueing 50 - 75% of the time    Pain Pain Assessment Pain Assessment: No/denies pain Pain Score: 0-No pain Patients Stated Pain Goal: 2 PAINAD (Pain Assessment in Advanced Dementia) Breathing: normal Negative Vocalization: none Facial Expression: smiling or inexpressive Body Language: relaxed Consolability: no need to console PAINAD Score: 0  Therapy/Group: Individual Therapy  Angie Hogg 07/09/2017, 12:06 PM

## 2017-07-10 ENCOUNTER — Inpatient Hospital Stay (HOSPITAL_COMMUNITY): Payer: Federal, State, Local not specified - PPO | Admitting: Physical Therapy

## 2017-07-10 ENCOUNTER — Inpatient Hospital Stay (HOSPITAL_COMMUNITY): Payer: Federal, State, Local not specified - PPO | Admitting: Occupational Therapy

## 2017-07-10 ENCOUNTER — Inpatient Hospital Stay (HOSPITAL_COMMUNITY): Payer: Medicare Other | Admitting: Speech Pathology

## 2017-07-10 LAB — PROTIME-INR
INR: 2.08
Prothrombin Time: 23.2 seconds — ABNORMAL HIGH (ref 11.4–15.2)

## 2017-07-10 MED ORDER — NYSTATIN 100000 UNIT/ML MT SUSP
5.0000 mL | Freq: Four times a day (QID) | OROMUCOSAL | Status: DC
  Administered 2017-07-10 (×2): 500000 [IU] via ORAL
  Administered 2017-07-11 (×3): 5 mL via ORAL
  Administered 2017-07-11 – 2017-07-12 (×4): 500000 [IU] via ORAL
  Administered 2017-07-12: 5 mL via ORAL
  Administered 2017-07-13: 500000 [IU] via ORAL
  Filled 2017-07-10 (×11): qty 5

## 2017-07-10 MED ORDER — WARFARIN SODIUM 5 MG PO TABS
5.0000 mg | ORAL_TABLET | ORAL | Status: DC
  Administered 2017-07-10: 5 mg via ORAL
  Filled 2017-07-10: qty 1

## 2017-07-10 MED ORDER — OXYBUTYNIN CHLORIDE 5 MG PO TABS
5.0000 mg | ORAL_TABLET | Freq: Every day | ORAL | Status: DC
  Administered 2017-07-10 – 2017-07-12 (×3): 5 mg via ORAL
  Filled 2017-07-10 (×3): qty 1

## 2017-07-10 MED ORDER — WARFARIN SODIUM 2.5 MG PO TABS
2.5000 mg | ORAL_TABLET | ORAL | Status: DC
  Administered 2017-07-11 – 2017-07-12 (×2): 2.5 mg via ORAL
  Filled 2017-07-10 (×2): qty 1

## 2017-07-10 NOTE — Progress Notes (Signed)
Occupational Therapy Session Note  Patient Details  Name: Charles Hatfield MRN: [id number] Date of Birth: [birth year]  Today's Date: 07/10/2017 OT Individual Time: 1300-1415 OT Individual Time Calculation (min): 75 min   Short Term Goals: Week 2:  OT Short Term Goal 1 (Week 2): STG=LTG 2/2 ELOS  Skilled Therapeutic Interventions/Progress Updates:    OT treatment session focused on pt/family education within self-care tasks. Discussed pt progress and OT goals with pt and his son. Pt transferred OOB and completed stand-pivot to wc with close supervision and instructional cues to safely locate chair. Pt brought down to rehab apartment and OT set-up walk-in shower simulation to replicate home set-up. Pt practiced shower tansfer with OT fist as OT educated pt's son on how to safely cue pt 2/2 visual deficits. Pt's son learned quickly and did a great job of provided safe and accurate cues to assist pt with shower transfer. Pt returned to room for lunch. Worked on self-feeding with low vision strategies and reviewed pt's dysphagia diet with son. Son did a good job of cuing pt for extra swallow and appropriate sized bites. Pt left seated in wc at end of session with son present and needs met.   Therapy Documentation Precautions:  Precautions Precautions: Fall Precaution Comments: profound visual deficits Restrictions Weight Bearing Restrictions: No Pain:  none/denies pain See Function Navigator for Current Functional Status.   Therapy/Group: Individual Therapy  [name] 07/10/2017, 2:18 PM

## 2017-07-10 NOTE — Progress Notes (Signed)
Subjective/Complaints:  Occ cough , no pain overnite, some bladder "accidents" at night  Review of systems: Limited, but appears to deny CP, SOB, nausea, vomiting, diarrhea.  Objective: Vital Signs: Blood pressure (!) 145/50, pulse 61, temperature 97.8 F (36.6 C), temperature source Oral, resp. rate 16, height 5\' 6"  (1.676 m), weight 57.7 kg (127 lb 3.3 oz), SpO2 100 %. No results found. Results for orders placed or performed during the hospital encounter of 06/28/17 (from the past 72 hour(s))  Protime-INR     Status: Abnormal   Collection Time: 07/08/17  8:14 AM  Result Value Ref Range   Prothrombin Time 24.3 (H) 11.4 - 15.2 seconds   INR 2.21   Protime-INR     Status: Abnormal   Collection Time: 07/09/17  8:28 AM  Result Value Ref Range   Prothrombin Time 25.9 (H) 11.4 - 15.2 seconds   INR 2.40   CBC     Status: None   Collection Time: 07/09/17  8:28 AM  Result Value Ref Range   WBC 7.8 4.0 - 10.5 K/uL   RBC 4.44 4.22 - 5.81 MIL/uL   Hemoglobin 14.9 13.0 - 17.0 g/dL   HCT 16.1 09.6 - 04.5 %   MCV 98.4 78.0 - 100.0 fL   MCH 33.6 26.0 - 34.0 pg   MCHC 34.1 30.0 - 36.0 g/dL   RDW 40.9 81.1 - 91.4 %   Platelets 150 150 - 400 K/uL     HEENT: Atraumatic normocephalic Cardio: Irregularly irregular. No JVD. Resp: CTA B/L and unlaboured GI: BS positive and Nontender nondistended Skin:   Intact. Warm and dry Neuro: Alert  Motor:  4-/5 in the right deltoid bicep tricep grip hip flexion knee extension ankle dorsiflexion (unchanged), 5/5 on left side Musc/Skel:  No edema. No tenderness. General no acute distress. Vital signs reviewed.   Assessment/Plan: 1. Functional deficits secondary to Right side weakness secondary to previous left pontine infarct with dysarthria secondary to acute ischemic nonhemorrhagic infarct involving the posterior limb right internal capsule   which require 3+ hours per day of interdisciplinary therapy in a comprehensive inpatient rehab  setting. Physiatrist is providing close team supervision and 24 hour management of active medical problems listed below. Physiatrist and rehab team continue to assess barriers to discharge/monitor patient progress toward functional and medical goals. FIM: Function - Bathing Position: Shower Body parts bathed by patient: Right arm, Left upper leg, Right lower leg, Left arm, Abdomen, Chest, Left lower leg, Front perineal area, Right upper leg Body parts bathed by helper: Buttocks, Back Assist Level: Touching or steadying assistance(Pt > 75%)  Function- Upper Body Dressing/Undressing What is the patient wearing?: Pull over shirt/dress Pull over shirt/dress - Perfomed by patient: Thread/unthread right sleeve, Thread/unthread left sleeve, Pull shirt over trunk, Put head through opening Assist Level: Set up Set up : To obtain clothing/put away Function - Lower Body Dressing/Undressing What is the patient wearing?: Non-skid slipper socks, Pants, Underwear Position: Wheelchair/chair at sink Underwear - Performed by patient: Thread/unthread right underwear leg, Thread/unthread left underwear leg Underwear - Performed by helper: Pull underwear up/down Pants- Performed by patient: Thread/unthread right pants leg, Thread/unthread left pants leg Pants- Performed by helper: Pull pants up/down Non-skid slipper socks- Performed by patient: Don/doff right sock, Don/doff left sock Assist for footwear: Supervision/touching assist Assist for lower body dressing: Touching or steadying assistance (Pt > 75%)  Function - Toileting Toileting steps completed by patient: Adjust clothing prior to toileting, Performs perineal hygiene, Adjust clothing after toileting Toileting  steps completed by helper: Adjust clothing prior to toileting, Adjust clothing after toileting Toileting Assistive Devices: Grab bar or rail Assist level: Touching or steadying assistance (Pt.75%)  Function - Toilet Transfers Toilet  transfer assistive device: Grab bar Assist level to toilet: Touching or steadying assistance (Pt > 75%) Assist level from toilet: Touching or steadying assistance (Pt > 75%)  Function - Chair/bed transfer Chair/bed transfer method: Stand pivot Chair/bed transfer assist level: Touching or steadying assistance (Pt > 75%) Chair/bed transfer assistive device: Armrests, Bedrails Chair/bed transfer details: Verbal cues for technique, Verbal cues for precautions/safety, Verbal cues for safe use of DME/AE, Manual facilitation for weight shifting, Manual facilitation for placement  Function - Locomotion: Wheelchair Will patient use wheelchair at discharge?: (tbd) Type: Manual Max wheelchair distance: 150 Assist Level: Dependent (Pt equals 0%) Assist Level: Dependent (Pt equals 0%) Assist Level: Dependent (Pt equals 0%) Turns around,maneuvers to table,bed, and toilet,negotiates 3% grade,maneuvers on rugs and over doorsills: No Function - Locomotion: Ambulation Assistive device: Walker-rolling Max distance: 25 Assist level: Touching or steadying assistance (Pt > 75%) Assist level: Touching or steadying assistance (Pt > 75%) Walk 50 feet with 2 turns activity did not occur: Safety/medical concerns Assist level: Touching or steadying assistance (Pt > 75%) Walk 150 feet activity did not occur: Safety/medical concerns Assist level: Touching or steadying assistance (Pt > 75%) Walk 10 feet on uneven surfaces activity did not occur: Safety/medical concerns  Function - Comprehension Comprehension: Auditory Comprehension assist level: Understands basic 75 - 89% of the time/ requires cueing 10 - 24% of the time  Function - Expression Expression: Verbal Expression assist level: Expresses basic 50 - 74% of the time/requires cueing 25 - 49% of the time. Needs to repeat parts of sentences.  Function - Social Interaction Social Interaction assist level: Interacts appropriately 50 - 74% of the time - May  be physically or verbally inappropriate.  Function - Problem Solving Problem solving assist level: Solves basic 50 - 74% of the time/requires cueing 25 - 49% of the time  Function - Memory Memory assist level: Recognizes or recalls 25 - 49% of the time/requires cueing 50 - 75% of the time Patient normally able to recall (first 3 days only): Current season, That he or she is in a hospital  Medical Problem List and Plan:  1. 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 DVT Prophylaxis/Anticoagulation: Chronic Coumadin.  History of poor compliance monitor for any bleeding episodes    INR therapeutic on  1/14 Hbg normal 3. Pain Management: Tylenol as needed  4. Mood: Provide emotional support  5. Neuropsych: This patient is not fully capable of making decisions on his own behalf.  6. Skin/Wound Care: Routine skin checks  7. Fluids/Electrolytes/Nutrition: Routine I&O's Intake   BMET stable on 1/11 8. Dysphagia. Dysphagia #1 nectar liquids. Monitor hydration with follow-up speech therapy  9. Atrial fibrillation. Cardiac rate controlled. Continue Coumadin  10. CAD/left ventricular apical thrombus/NSTEMI with CABG 2013. No chest pain noted. Continue aspirin  11. Hypertension. Monitor with increased mobility  Vitals:   07/09/17 1510 07/10/17 0500  BP: (!) 145/50 (!) 145/50  Pulse: 60 61  Resp: 18 16  Temp: 97.9 F (36.6 C) 97.8 F (36.6 C)  SpO2: 99% 100%    BP  controlled on 1/15, currently not on any hypertensive meds 12. COPD with remote tobacco abuse. Continue albuterol treatments as needed appears compensated Cough- likely related to dysphagia CXR neg 13. Chronic systolic congestive heart failure. Monitor for any signs of fluid overload  Filed Weights    06/28/17 1820 07/04/17 0803  Weight: 59.4 kg (130 lb 15.3 oz) 57.7 kg (127 lb 3.3 oz)  No peripheral edema, weighs down 14. Hyperlipidemia. Lipitor  15. Vascular dementia secondary to multi-infarcts small vessel disease 16.  Urinary incont likely multifactorial, pt c/o at night will add low dose oxybutnin LOS (Days) 12 A FACE TO FACE EVALUATION WAS PERFORMED  [name] 07/10/2017, 7:03 AM

## 2017-07-10 NOTE — Progress Notes (Signed)
ANTICOAGULATION CONSULT NOTE  Pharmacy Consult for Warfarin Indication: atrial fibrillation  No Known Allergies  Patient Measurements: Height: 5\' 6"  (167.6 cm) Weight: 127 lb 3.3 oz (57.7 kg) IBW/kg (Calculated) : 63.8  Vital Signs: Temp: 97.8 F (36.6 C) (01/15 0500) Temp Source: Oral (01/15 0500) BP: 145/50 (01/15 0500) Pulse Rate: 61 (01/15 0500)  Labs: Recent Labs    07/08/17 0814 07/09/17 0828 07/10/17 0732  HGB  --  14.9  --   HCT  --  43.7  --   PLT  --  150  --   LABPROT 24.3* 25.9* 23.2*  INR 2.21 2.40 2.08   Estimated Creatinine Clearance: 44.6 mL/min (by C-G formula based on SCr of 1.15 mg/dL).  Assessment: 52 yoM with PMH afib, prior CVA, admitted for stroke sx but not a TPA candidate. Patient supposed to be taking warfarin PTA but does not appear to be, INR ~1.0 on admit. Per neuro ok to resume warfarin while admitted, no bridging needed. INR 2.08, therapeutic. Hgb 14.9, pltc 150 K, stable. PO intake improved. No bleeding noted per chart.  Goal of Therapy: INR 2-3   Plan: Warfarin 5mg  on Tuesday and Saturdays, 2.5 mg all other days Daily INR Monitor for s/sx bleeding  Bayard Hugger, PharmD, BCPS  Clinical Pharmacist  Pager: [phone]   07/10/2017 1:31 PM

## 2017-07-10 NOTE — Progress Notes (Signed)
Speech Language Pathology Daily Session Note  Patient Details  Name: Charles Hatfield MRN: [id number] Date of Birth: [birth year]  Today's Date: 07/10/2017 SLP Individual Time: 1000-1100 SLP Individual Time Calculation (min): 60 min  Short Term Goals: Week 2: SLP Short Term Goal 1 (Week 2): Patient will utilize a slow rate, overarticulation, and an increased vocal intensity to achieve ~75% intelligibility at the phrase level with min assist verbal cues.  SLP Short Term Goal 2 (Week 2): Patient will consume dys 2 textures and nectar thick liquids with Min verbal cues for use of swallowing precautions with minimal overt s/s of aspiration.   SLP Short Term Goal 3 (Week 2): Patient will consume therapeutic trials of thin liquids with minimal overt s/s of aspiration and Min verbal cues for use of swallowing precautions over 3 sessions prior to upgrade.   SLP Short Term Goal 4 (Week 2): Pt will complete basic, familiar tasks with min assist verbal cues for problem solving.   SLP Short Term Goal 5 (Week 2): Patient will recall daily information with Min verbal and visual cues for use of external aids.   SLP Short Term Goal 6 (Week 2): Pt will selectively attend to basic tasks for 15 minutes with supervision verbal cues for redirection.    Skilled Therapeutic Interventions: Skilled treatment session focused on dysphagia and speech goals. Upon arrival, patient was asleep in bed but was agreeable to participate in treatment session. Patient scooted to the EOB and transferred to the wheelchair with Min verbal cues for safety. Patient declined trials of upgraded textures and requested a magic cup instead. Patient consumed snack of Dys. 1 textures with nectar-thick liquids with tongue pumping, suspected delayed swallow initiation and Max A verbal cues needed for use of multiple swallows, to self-monitor and correct anterior spillage of saliva and for intermittent initiation of swallow. Patient demonstrated  intermittent verbal frustration with clinician due to multiple cues. Patient was also ~75% intelligible at the phrase level with Mod A verbal cues needed for use of a slow rate and over articulation. Patient's son present for last 10 minutes of session and educated on patient's current cognitive and swallowing function. He verbalized understanding but continued education is required. Patient handed off to PT. Continue with current plan of care.      Function:  Eating Eating   Modified Consistency Diet: Yes Eating Assist Level: More than reasonable amount of time;Set up assist for;Supervision or verbal cues   Eating Set Up Assist For: Opening containers       Cognition Comprehension Comprehension assist level: Understands basic 75 - 89% of the time/ requires cueing 10 - 24% of the time  Expression   Expression assist level: Expresses basic 50 - 74% of the time/requires cueing 25 - 49% of the time. Needs to repeat parts of sentences.  Social Interaction Social Interaction assist level: Interacts appropriately 50 - 74% of the time - May be physically or verbally inappropriate.  Problem Solving Problem solving assist level: Solves basic 50 - 74% of the time/requires cueing 25 - 49% of the time  Memory Memory assist level: Recognizes or recalls 25 - 49% of the time/requires cueing 50 - 75% of the time    Pain No/Denies Pain   Therapy/Group: Individual Therapy  Charles Hatfield 07/10/2017, 12:34 PM

## 2017-07-10 NOTE — Progress Notes (Signed)
Physical Therapy Session Note  Patient Details  Name: Charles Hatfield MRN: [id number] Date of Birth: 06-05-1941  Today's Date: 07/10/2017 PT Individual Time: 1100-1200 PT Individual Time Calculation (min): 60 min   Short Term Goals: Week 2:  PT Short Term Goal 1 (Week 2): STG =LTG due to ELOS  Skilled Therapeutic Interventions/Progress Updates:    no c/o pain but reports fatigue.  Son present for family education today and session focus on that as well as functional mobility and activity tolerance.    Gait training x100' +50' with RW and close supervision with intermittent steady assist during turning.  Stair negotiation x4 steps with 2 rails and min assist with verbal cues for sequencing leading LE on ascent/descent.  Transfers throughout session with and without AD with supervision.  PT provided extensive verbal education to pt's son throughout session regarding CLOF, recommendation for 24/7 supervision at d/c, and expectation that pt will not need physical assist at d/c.  Son concerned about his need to work up through 1/23 and pt's risk for aspiration if he were left alone during this time.  Will discuss with care team and at conference tomorrow.  Pt's son completed hands on training and signed off for basic bed<>w/c transfers.  Pt positioned to comfort in bed with call bell in reach and needs met.   Therapy Documentation Precautions:  Precautions Precautions: Fall Precaution Comments: profound visual deficits Restrictions Weight Bearing Restrictions: No   See Function Navigator for Current Functional Status.   Therapy/Group: Individual Therapy  Michel Santee 07/10/2017, 12:09 PM

## 2017-07-11 ENCOUNTER — Inpatient Hospital Stay (HOSPITAL_COMMUNITY): Payer: Federal, State, Local not specified - PPO | Admitting: Physical Therapy

## 2017-07-11 ENCOUNTER — Inpatient Hospital Stay (HOSPITAL_COMMUNITY): Payer: Medicare Other | Admitting: Speech Pathology

## 2017-07-11 ENCOUNTER — Inpatient Hospital Stay (HOSPITAL_COMMUNITY): Payer: Federal, State, Local not specified - PPO | Admitting: Occupational Therapy

## 2017-07-11 LAB — PROTIME-INR
INR: 2.1
PROTHROMBIN TIME: 23.3 s — AB (ref 11.4–15.2)

## 2017-07-11 MED ORDER — MUSCLE RUB 10-15 % EX CREA
TOPICAL_CREAM | CUTANEOUS | Status: DC | PRN
  Administered 2017-07-11: 12:00:00 via TOPICAL
  Filled 2017-07-11: qty 85

## 2017-07-11 NOTE — Progress Notes (Signed)
Occupational Therapy Session Note  Patient Details  Name: Charles Hatfield MRN: [id number] Date of Birth: December 11, 1940  Today's Date: 07/11/2017  Session 1 OT Individual Time: 6767-2094 OT Individual Time Calculation (min): 42 min   Session 2 OT Individual Time: 1330-1400 OT Individual Time Calculation (min): 30 min   Short Term Goals: Week 2:  OT Short Term Goal 1 (Week 2): STG=LTG 2/2 ELOS  Skilled Therapeutic Interventions/Progress Updates:  Session 1   OT treatment session focused on standing balance/endurance, functional use of R UE, and B UE strength/coordination. Pt seemed very down upon OT arrival. Pt not wanting to do much with therapy and declined B and D session. Pt agreeable to go to a brighter part of the hospital to help with mood for therapy session. Pt stood in window with sun shining and OT played pt preferred Elvis music. Pt sang along to favorite songs and participated in reaching activity using glass window and thera-putty. Improved initiation of head turns to locate putty on R side of window. Pt tolerated standing for three, 5 minute intervals. Pt returned to room at end of session. Doffed shoes and socks and donned yellow gripper socks 2/2 toe discomfort. Pt left with needs met and pt preferred music on compute radio.    Session 2 OT treatment session focused on functional ambulation, standing balance, and functional transfers. Pt reports need for bathroom upon OT arrival. Pt transfers OOB and needs cues to get feet all the way to the floor prior to trying to stand with RW. Verbal cues for pathfinding and object avoidance 2/2 field cut.  Pt transferred onto commode with RW, voided bladder and completed 2/2 toileting steps with supervision. Worked on standing balance/endurance and use of B UEs with standing hand washing task at the sink. OT educated pt on proper placement for RW at the sink and modifications for visual deficits to locate soap and paper towels. Pt returned to  bed at end of session and left with bed alarm on and needs met.   Therapy Documentation Precautions:  Precautions Precautions: Fall Precaution Comments: profound visual deficits Restrictions Weight Bearing Restrictions: No Pain: None/denies pain  See Function Navigator for Current Functional Status.  Therapy/Group: Individual Therapy  [name] 07/11/2017, 2:02 PM

## 2017-07-11 NOTE — Progress Notes (Signed)
Subjective/Complaints:  Finished ~100% breakfast, denies pain complaint.  Single word answers No cough this am  Review of systems: Limited, but appears to deny CP, SOB, nausea, vomiting, diarrhea.  Objective: Vital Signs: Blood pressure 121/60, pulse 63, temperature (!) 97.5 F (36.4 C), temperature source Oral, resp. rate 18, height 5\' 6"  (1.676 m), weight 57.7 kg (127 lb 3.3 oz), SpO2 97 %. No results found. Results for orders placed or performed during the hospital encounter of 06/28/17 (from the past 72 hour(s))  Protime-INR     Status: Abnormal   Collection Time: 07/08/17  8:14 AM  Result Value Ref Range   Prothrombin Time 24.3 (H) 11.4 - 15.2 seconds   INR 2.21   Protime-INR     Status: Abnormal   Collection Time: 07/09/17  8:28 AM  Result Value Ref Range   Prothrombin Time 25.9 (H) 11.4 - 15.2 seconds   INR 2.40   CBC     Status: None   Collection Time: 07/09/17  8:28 AM  Result Value Ref Range   WBC 7.8 4.0 - 10.5 K/uL   RBC 4.44 4.22 - 5.81 MIL/uL   Hemoglobin 14.9 13.0 - 17.0 g/dL   HCT 91.4 78.2 - 95.6 %   MCV 98.4 78.0 - 100.0 fL   MCH 33.6 26.0 - 34.0 pg   MCHC 34.1 30.0 - 36.0 g/dL   RDW 21.3 08.6 - 57.8 %   Platelets 150 150 - 400 K/uL  Protime-INR     Status: Abnormal   Collection Time: 07/10/17  7:32 AM  Result Value Ref Range   Prothrombin Time 23.2 (H) 11.4 - 15.2 seconds   INR 2.08      HEENT: Atraumatic normocephalic Cardio: Irregularly irregular. No JVD. Resp: CTA B/L and unlaboured GI: BS positive and Nontender nondistended Skin:   Intact. Warm and dry Neuro: Alert  Motor:  4-/5 in the right deltoid bicep tricep grip hip flexion knee extension ankle dorsiflexion (unchanged), 5/5 on left side Musc/Skel:  No edema. No tenderness. General no acute distress. Vital signs reviewed.   Assessment/Plan: 1. Functional deficits secondary to Right side weakness secondary to previous left pontine infarct with dysarthria secondary to acute ischemic  nonhemorrhagic infarct involving the posterior limb right internal capsule   which require 3+ hours per day of interdisciplinary therapy in a comprehensive inpatient rehab setting. Physiatrist is providing close team supervision and 24 hour management of active medical problems listed below. Physiatrist and rehab team continue to assess barriers to discharge/monitor patient progress toward functional and medical goals. FIM: Function - Bathing Position: Shower Body parts bathed by patient: Right arm, Left upper leg, Right lower leg, Left arm, Abdomen, Chest, Left lower leg, Front perineal area, Right upper leg Body parts bathed by helper: Buttocks, Back Assist Level: Touching or steadying assistance(Pt > 75%)  Function- Upper Body Dressing/Undressing What is the patient wearing?: Pull over shirt/dress Pull over shirt/dress - Perfomed by patient: Thread/unthread right sleeve, Thread/unthread left sleeve, Pull shirt over trunk, Put head through opening Assist Level: Set up Set up : To obtain clothing/put away Function - Lower Body Dressing/Undressing What is the patient wearing?: Non-skid slipper socks, Pants, Underwear Position: Wheelchair/chair at sink Underwear - Performed by patient: Thread/unthread right underwear leg, Thread/unthread left underwear leg Underwear - Performed by helper: Pull underwear up/down Pants- Performed by patient: Thread/unthread right pants leg, Thread/unthread left pants leg Pants- Performed by helper: Pull pants up/down Non-skid slipper socks- Performed by patient: Don/doff right sock, Don/doff left sock  Assist for footwear: Supervision/touching assist Assist for lower body dressing: Touching or steadying assistance (Pt > 75%)  Function - Toileting Toileting steps completed by patient: Adjust clothing prior to toileting, Adjust clothing after toileting Toileting steps completed by helper: Adjust clothing prior to toileting, Adjust clothing after  toileting Toileting Assistive Devices: Grab bar or rail Assist level: Touching or steadying assistance (Pt.75%)  Function - Toilet Transfers Toilet transfer assistive device: Grab bar Assist level to toilet: Touching or steadying assistance (Pt > 75%) Assist level from toilet: Touching or steadying assistance (Pt > 75%)  Function - Chair/bed transfer Chair/bed transfer method: Squat pivot Chair/bed transfer assist level: Supervision or verbal cues Chair/bed transfer assistive device: Bedrails, Armrests Chair/bed transfer details: Verbal cues for technique, Verbal cues for precautions/safety, Verbal cues for safe use of DME/AE, Manual facilitation for weight shifting, Manual facilitation for placement  Function - Locomotion: Wheelchair Will patient use wheelchair at discharge?: (tbd) Type: Manual Max wheelchair distance: 150 Assist Level: Dependent (Pt equals 0%) Assist Level: Dependent (Pt equals 0%) Assist Level: Dependent (Pt equals 0%) Turns around,maneuvers to table,bed, and toilet,negotiates 3% grade,maneuvers on rugs and over doorsills: No Function - Locomotion: Ambulation Assistive device: Walker-rolling Max distance: 100 Assist level: Touching or steadying assistance (Pt > 75%) Assist level: Supervision or verbal cues Walk 50 feet with 2 turns activity did not occur: Safety/medical concerns Assist level: Supervision or verbal cues Walk 150 feet activity did not occur: Safety/medical concerns Assist level: Touching or steadying assistance (Pt > 75%) Walk 10 feet on uneven surfaces activity did not occur: Safety/medical concerns  Function - Comprehension Comprehension: Auditory Comprehension assist level: Understands basic 75 - 89% of the time/ requires cueing 10 - 24% of the time  Function - Expression Expression: Verbal Expression assist level: Expresses basic 50 - 74% of the time/requires cueing 25 - 49% of the time. Needs to repeat parts of sentences.  Function -  Social Interaction Social Interaction assist level: Interacts appropriately 50 - 74% of the time - May be physically or verbally inappropriate.  Function - Problem Solving Problem solving assist level: Solves basic 50 - 74% of the time/requires cueing 25 - 49% of the time  Function - Memory Memory assist level: Recognizes or recalls 25 - 49% of the time/requires cueing 50 - 75% of the time Patient normally able to recall (first 3 days only): Current season, That he or she is in a hospital  Medical Problem List and Plan:  1. 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 DVT Prophylaxis/Anticoagulation: Chronic Coumadin.  History of poor compliance monitor for any bleeding episodes    INR therapeutic no evidence of bleeding, Hgb stable 3. Pain Management: Tylenol as needed  4. Mood: Provide emotional support  5. Neuropsych: This patient is not fully capable of making decisions on his own behalf.  6. Skin/Wound Care: Routine skin checks  7. Fluids/Electrolytes/Nutrition: Routine I&O's Intake   BMET stable on 1/11 8. Dysphagia. Dysphagia #1 nectar liquids. Monitor hydration with follow-up speech therapy  9. Atrial fibrillation. Cardiac rate controlled. Continue Coumadin  10. CAD/left ventricular apical thrombus/NSTEMI with CABG 2013. No chest pain noted. Continue aspirin  11. Hypertension. Monitor with increased mobility  Vitals:   07/10/17 1500 07/11/17 0520  BP: (!) 124/54  121/60  Pulse: 70 63  Resp: 18 18  Temp: 98.6 F (37 C) (!) 97.5 F (36.4 C)  SpO2: 97%     BP controlled on 1/16, currently not on any hypertensive meds 12. COPD with remote tobacco abuse. Continue albuterol treatments as needed appears compensated Cough- likely related to dysphagia  CXR neg 13. Chronic systolic congestive heart failure. Monitor for any signs of fluid overload  Filed Weights   06/28/17 1820 07/04/17 0803  Weight: 59.4 kg (130 lb 15.3 oz) 57.7 kg (127 lb 3.3 oz)  No peripheral edema, weighs down 14. Hyperlipidemia. Lipitor  15. Vascular dementia secondary to multi-infarcts small vessel disease 16.  Urinary incont likely multifactorial, pt c/o at night will add low dose oxybutnin LOS (Days) 13 A FACE TO FACE EVALUATION WAS PERFORMED  [name] 07/11/2017, 7:52 AM

## 2017-07-11 NOTE — Progress Notes (Signed)
ANTICOAGULATION CONSULT NOTE  Pharmacy Consult for Warfarin Indication: atrial fibrillation  No Known Allergies  Patient Measurements: Height: 5\' 6"  (167.6 cm) Weight: 127 lb 3.3 oz (57.7 kg) IBW/kg (Calculated) : 63.8  Vital Signs: Temp: 98.2 F (36.8 C) (01/16 1423) Temp Source: Oral (01/16 1423) BP: 110/42 (01/16 1443) Pulse Rate: 67 (01/16 1423)  Labs: Recent Labs    07/09/17 0828 07/10/17 0732 07/11/17 0812  HGB 14.9  --   --   HCT 43.7  --   --   PLT 150  --   --   LABPROT 25.9* 23.2* 23.3*  INR 2.40 2.08 2.10   Estimated Creatinine Clearance: 44.6 mL/min (by C-G formula based on SCr of 1.15 mg/dL).  Assessment: 65 yoM with PMH afib, prior CVA, admitted for stroke sx but not a TPA candidate. Patient supposed to be taking warfarin PTA but does not appear to be, INR ~1.0 on admit. Per neuro ok to resume warfarin while admitted, no bridging needed. INR 2.1, therapeutic. Hgb 14.9, pltc 150 K, stable. PO intake improved. No bleeding noted per chart.  Goal of Therapy: INR 2-3   Plan: Continue Warfarin 5mg  on Tuesday and Saturdays, 2.5 mg all other days Daily INR Monitor for s/sx bleeding  Bayard Hugger, PharmD, BCPS  Clinical Pharmacist  Pager: [phone]   07/11/2017 3:15 PM

## 2017-07-11 NOTE — Progress Notes (Signed)
Physical Therapy Session Note  Patient Details  Name: Alexandros F Koskela MRN: [id number] Date of Birth: 07/15/1940  Today's Date: 07/11/2017 PT Individual Time: 1001-1059 PT Individual Time Calculation (min): 58 min   Short Term Goals: Week 2:  PT Short Term Goal 1 (Week 2): STG =LTG due to ELOS  Skilled Therapeutic Interventions/Progress Updates:    no c/o pain at rest.  Session focus on activity tolerance, limited by c/o pain with weightbearing on L foot.  RN aware.    LB dressing from EOB with supervision for dynamic balance.  Stand/pivot throughout session with min guard without RW, sit<>stand with supervision with RW.  Nustep x8 minutes at level 4 for activity tolerance and reciprocal movement pattern retraining.  Occasional rest break 2/2 L foot pain.  UE therex for strengthening and activity tolerance with 2# weight, 2 sets of 8-10 reps bilaterally for shoulder press, bicep curl, punches, and rows.  Gait training x125' with RW and supervision, cues for path finding 2/2 low vision.  Pt returned to room in w/c at end of session, positioned upright with QRB in place, call bell in reach and needs met.   Therapy Documentation Precautions:  Precautions Precautions: Fall Precaution Comments: profound visual deficits Restrictions Weight Bearing Restrictions: No   See Function Navigator for Current Functional Status.   Therapy/Group: Individual Therapy   E  07/11/2017, 10:40 AM  

## 2017-07-11 NOTE — Progress Notes (Signed)
Speech Language Pathology Daily Session Note  Patient Details  Name: Charles Hatfield MRN: [id number] Date of Birth: 1940-08-10  Today's Date: 07/11/2017 SLP Individual Time: 0900-0920 SLP Individual Time Calculation (min): 20 min and Today's Date: 07/11/2017 SLP Missed Time: 40 Minutes Missed Time Reason: Patient ill (Comment)  Short Term Goals: Week 2: SLP Short Term Goal 1 (Week 2): Patient will utilize a slow rate, overarticulation, and an increased vocal intensity to achieve ~75% intelligibility at the phrase level with min assist verbal cues.  SLP Short Term Goal 2 (Week 2): Patient will consume dys 2 textures and nectar thick liquids with Min verbal cues for use of swallowing precautions with minimal overt s/s of aspiration.   SLP Short Term Goal 3 (Week 2): Patient will consume therapeutic trials of thin liquids with minimal overt s/s of aspiration and Min verbal cues for use of swallowing precautions over 3 sessions prior to upgrade.   SLP Short Term Goal 4 (Week 2): Pt will complete basic, familiar tasks with min assist verbal cues for problem solving.   SLP Short Term Goal 5 (Week 2): Patient will recall daily information with Min verbal and visual cues for use of external aids.   SLP Short Term Goal 6 (Week 2): Pt will selectively attend to basic tasks for 15 minutes with supervision verbal cues for redirection.    Skilled Therapeutic Interventions: Skilled treatment session focused on speech goals. Upon arrival, patient was upright in bed with a washcloth over his face. Patient reported he felt "lousy" but was unable to elaborate. This clinician suspects fatigue and questionable depression impacting patient's ability to participate. Patient declined trials and all attempts for clinician to get patient OOB, therefore, patient missed remaining 40 minutes of session. Patient required overall Min A verbal cues for use of speech intelligibility strategies at the phrase level during an  informal conversation. Patient left supine in bed with alarm on. Continue with current plan of care.    Function:   Cognition Comprehension Comprehension assist level: Understands basic 75 - 89% of the time/ requires cueing 10 - 24% of the time  Expression   Expression assist level: Expresses basic 50 - 74% of the time/requires cueing 25 - 49% of the time. Needs to repeat parts of sentences.  Social Interaction Social Interaction assist level: Interacts appropriately 50 - 74% of the time - May be physically or verbally inappropriate.  Problem Solving Problem solving assist level: Solves basic 50 - 74% of the time/requires cueing 25 - 49% of the time  Memory Memory assist level: Recognizes or recalls 25 - 49% of the time/requires cueing 50 - 75% of the time    Pain No/Denies Pain   Therapy/Group: Individual Therapy  [name] 07/11/2017, 3:07 PM

## 2017-07-12 ENCOUNTER — Ambulatory Visit (HOSPITAL_COMMUNITY): Payer: Federal, State, Local not specified - PPO | Admitting: Physical Therapy

## 2017-07-12 ENCOUNTER — Inpatient Hospital Stay (HOSPITAL_COMMUNITY): Payer: Federal, State, Local not specified - PPO | Admitting: Physical Therapy

## 2017-07-12 ENCOUNTER — Inpatient Hospital Stay (HOSPITAL_COMMUNITY): Payer: Federal, State, Local not specified - PPO | Admitting: Occupational Therapy

## 2017-07-12 ENCOUNTER — Inpatient Hospital Stay (HOSPITAL_COMMUNITY): Payer: Medicare Other | Admitting: Speech Pathology

## 2017-07-12 LAB — PROTIME-INR
INR: 2.4
Prothrombin Time: 25.9 seconds — ABNORMAL HIGH (ref 11.4–15.2)

## 2017-07-12 NOTE — Discharge Summary (Signed)
Discharge summary job [id number]

## 2017-07-12 NOTE — Progress Notes (Signed)
Speech Language Pathology Session Note & Discharge Summary  Patient Details  Name: Charles Hatfield MRN: [id number] Date of Birth: [birth year]  Today's Date: 07/12/2017 SLP Individual Time: 1415-1500 SLP Individual Time Calculation (min): 45 min   Skilled Therapeutic Interventions:  Skilled treatment session focused on completion of family education. Patient's son educated in regards to the patient's current swallowing function, diet recommendations, appropriate textures, compensatory strategies, medication administration and how to appropriately thicken liquids. Patient's son also provided appropriate cueing to the patient for use of strategies with his lunch meal. Handouts were also given to reinforce all information. All education is complete at this time. Patient left upright in wheelchair with son present. Continue with current plan of care.    Patient has met 6 of 6 long term goals.  Patient to discharge at overall Mod level.   Reasons goals not met: N/A   Clinical Impression/Discharge Summary: Patient has made functional gains and has met 6 of 6 LTGs this admission. Currently, patient requires overall Min-Mod A verbal cues for use of speech intelligibility strategies to achieve ~75% intelligibility at the phrase level. Patient is also consuming Dys. 1 textures with nectar-thick liquids with minimal but intermittent overt s/s of aspiration due to severe oral dysphagia and overall Mod A multimodal cues needed for use of swallowing compensatory strategies of multiple swallows, small bites/sips and intermittent throat clear. Patient and family education is complete and patient will discharge home with 24 hour supervision from family. Patient would benefit from f/u SLP services to maximize his swallowing function and communication in order to maximize his overall functional independence and reduce caregiver burden.   Care Partner:  Caregiver Able to Provide Assistance: Yes  Type of Caregiver  Assistance: Physical;Cognitive  Recommendation:  Home Health SLP;24 hour supervision/assistance  Rationale for SLP Follow Up: Reduce caregiver burden;Maximize functional communication;Maximize cognitive function and independence;Maximize swallowing safety   Equipment: N/A   Reasons for discharge: Discharged from hospital   Patient/Family Agrees with Progress Made and Goals Achieved: Yes   Function:  Eating Eating   Modified Consistency Diet: Yes Eating Assist Level: More than reasonable amount of time;Set up assist for;Supervision or verbal cues   Eating Set Up Assist For: Opening containers       Cognition Comprehension Comprehension assist level: Understands basic 90% of the time/cues < 10% of the time  Expression   Expression assist level: Expresses basic 75 - 89% of the time/requires cueing 10 - 24% of the time. Needs helper to occlude trach/needs to repeat words.  Social Interaction Social Interaction assist level: Interacts appropriately 90% of the time - Needs monitoring or encouragement for participation or interaction.  Problem Solving Problem solving assist level: Solves basic 50 - 74% of the time/requires cueing 25 - 49% of the time  Memory Memory assist level: Recognizes or recalls 50 - 74% of the time/requires cueing 25 - 49% of the time   Romina Divirgilio 07/12/2017, 3:53 PM

## 2017-07-12 NOTE — Progress Notes (Signed)
Speech Language Pathology Daily Session Note  Patient Details  Name: Charles Hatfield MRN: [id number] Date of Birth: Oct 09, 1940  Today's Date: 07/12/2017 SLP Individual Time: 1100-1130 SLP Individual Time Calculation (min): 30 min  Short Term Goals: Week 2: SLP Short Term Goal 1 (Week 2): Patient will utilize a slow rate, overarticulation, and an increased vocal intensity to achieve ~75% intelligibility at the phrase level with min assist verbal cues.  SLP Short Term Goal 2 (Week 2): Patient will consume dys 2 textures and nectar thick liquids with Min verbal cues for use of swallowing precautions with minimal overt s/s of aspiration.   SLP Short Term Goal 3 (Week 2): Patient will consume therapeutic trials of thin liquids with minimal overt s/s of aspiration and Min verbal cues for use of swallowing precautions over 3 sessions prior to upgrade.   SLP Short Term Goal 4 (Week 2): Pt will complete basic, familiar tasks with min assist verbal cues for problem solving.   SLP Short Term Goal 5 (Week 2): Patient will recall daily information with Min verbal and visual cues for use of external aids.   SLP Short Term Goal 6 (Week 2): Pt will selectively attend to basic tasks for 15 minutes with supervision verbal cues for redirection.    Skilled Therapeutic Interventions: Skilled treatment session focused on dysphagia and speech goals. Upon arrival, patient was asleep in bed but was agreeable to participate in treatment session.  Patient consumed snack of Dys. 1 textures with nectar-thick liquids with tongue pumping, suspected delayed swallow initiation resulting in overt s/s of aspiration X 1. Patient required Mod A verbal cues for use of multiple swallows, to self-monitor and correct anterior spillage and for intermittent initiation of swallow throughout session. Patient was also ~75% intelligible at the phrase level with Min A verbal cues needed for use of a slow rate and over articulation. Patient left  supine in bed with all needs within reach. Continue with current plan of care.        Function:  Eating Eating   Modified Consistency Diet: Yes Eating Assist Level: More than reasonable amount of time;Set up assist for;Supervision or verbal cues   Eating Set Up Assist For: Opening containers       Cognition Comprehension Comprehension assist level: Understands basic 90% of the time/cues < 10% of the time  Expression   Expression assist level: Expresses basic 75 - 89% of the time/requires cueing 10 - 24% of the time. Needs helper to occlude trach/needs to repeat words.  Social Interaction Social Interaction assist level: Interacts appropriately 90% of the time - Needs monitoring or encouragement for participation or interaction.  Problem Solving Problem solving assist level: Solves basic 50 - 74% of the time/requires cueing 25 - 49% of the time  Memory Memory assist level: Recognizes or recalls 50 - 74% of the time/requires cueing 25 - 49% of the time    Pain No/Denies Pain   Therapy/Group: Individual Therapy  Niels Cranshaw 07/12/2017, 3:45 PM

## 2017-07-12 NOTE — Progress Notes (Signed)
Physical Therapy Discharge Summary  Patient Details  Name: Charles Hatfield MRN: [id number] Date of Birth: [birth year]  Today's Date: 07/12/2017 PT Individual Time: 4580-9983 PT Individual Time Calculation (min): 51 min   Pt sitting EOB and agreeable to therapy, requesting to toilet. Practiced performing gait in home-like environment to ambulate to/from toilet w/ RW w/ close supervision. Performed pericare w/ close supervision and verbal cues as well, reaching close to floor to pick up pants. Worked on functional mobility including w/c mobility, gait, stand pivot transfers, negotiating 12 steps, and bed mobility at supervision level as detailed below. Performed car transfer w/ min assist 2/2 pt's reported height of son's car. Min assist to boost. Additionally continued practicing gait in home environment, ambulating 30' from w/c in hallway to room requiring turns and negotiating obstacles in room environment. Verbal cues for obstacle negotiation and for RW management. Ended session in supine, call bell within reach and all needs met.   Patient has met 7 of 8 long term goals due to improved activity tolerance, improved balance, increased strength, ability to compensate for deficits, functional use of  left upper extremity and left lower extremity, improved awareness and improved coordination.  Patient to discharge at an ambulatory level Supervision.   Patient's care partner is independent to provide the necessary physical assistance at discharge.  Reasons goals not met: car transfer required min assist 2/2 pt's reporting height of son's car  Recommendation:  Patient will benefit from ongoing skilled PT services in home health setting to continue to advance safe functional mobility, address ongoing impairments in vision, endurance, LE strength and coordination, functional mobility, balance, and minimize fall risk.  Equipment: RW  Reasons for discharge: treatment goals met and discharge from  hospital  Patient/family agrees with progress made and goals achieved: Yes  PT Discharge Precautions/Restrictions Precautions Precautions: Fall Precaution Comments: blind in R eye Restrictions Weight Bearing Restrictions: No Pain Pain Assessment Pain Assessment: No/denies pain Pain Score: 0-No pain Vision/Perception  Vision - Assessment Additional Comments: Unable to formally assess, pt reports impaired vision mostly in R eye, turns head to locate objects especially in R visual field, asks therapist to locate objects such as w/c or bed Perception Perception: Within Functional Limits Praxis Praxis: Intact  Cognition Overall Cognitive Status: Impaired/Different from baseline Arousal/Alertness: Awake/alert Orientation Level: Oriented X4 Focused Attention: Appears intact Sustained Attention: Appears intact Selective Attention: Impaired Memory: Impaired Memory Impairment: Decreased recall of new information;Retrieval deficit Awareness: Impaired Problem Solving: Impaired Safety/Judgment: Impaired Comments: decreased awareness of deficits  Sensation Sensation Light Touch: Appears Intact Coordination Gross Motor Movements are Fluid and Coordinated: No Fine Motor Movements are Fluid and Coordinated: No Coordination and Movement Description: Impaired LE coordination observed w/ all functional movement, moves at very slow and cautious pace Motor  Motor Motor: Hemiplegia Motor - Skilled Clinical Observations: mild R hemi  Mobility Bed Mobility Bed Mobility: Supine to Sit;Sit to Supine;Rolling Right;Rolling Left Rolling Right: 5: Supervision Rolling Right Details: Verbal cues for technique Rolling Left: 5: Supervision Rolling Left Details: Verbal cues for technique Supine to Sit: 5: Supervision Supine to Sit Details: Verbal cues for technique Sit to Supine: 5: Supervision Transfers Transfers: Yes Sit to Stand: 5: Supervision Stand to Sit: 5: Supervision Stand Pivot  Transfers: 5: Supervision Stand Pivot Transfer Details: Verbal cues for safe use of DME/AE;Verbal cues for precautions/safety Stand Pivot Transfer Details (indicate cue type and reason): Verbal cues 2/2 impaired vision Locomotion  Ambulation Ambulation: Yes Ambulation/Gait Assistance: 5: Supervision Ambulation Distance (Feet): 175  Feet Assistive device: Rolling walker Ambulation/Gait Assistance Details: Verbal cues for precautions/safety;Verbal cues for safe use of DME/AE Ambulation/Gait Assistance Details: verbal cues for direction 2/2 impaired vision Gait Gait: Yes Gait Pattern: Impaired Gait Pattern: Trunk flexed;Decreased dorsiflexion - left;Decreased dorsiflexion - right;Shuffle;Poor foot clearance - left;Poor foot clearance - right Gait velocity: decreased Stairs / Additional Locomotion Stairs: Yes Stairs Assistance: 5: Supervision Stairs Assistance Details: Verbal cues for technique Stair Management Technique: Two rails Number of Stairs: 12 Height of Stairs: 6 Wheelchair Mobility Wheelchair Mobility: Yes Wheelchair Assistance: 5: Supervision Wheelchair Propulsion: Both upper extremities Wheelchair Parts Management: Needs assistance Distance: 75'  Trunk/Postural Assessment  Cervical Assessment Cervical Assessment: Exceptions to WFL(forward head, rounded shoulder posture) Thoracic Assessment Thoracic Assessment: Within Functional Limits Lumbar Assessment Lumbar Assessment: Within Functional Limits Postural Control Postural Control: Deficits on evaluation Protective Responses: delayed  Balance Balance Balance Assessed: Yes Static Standing Balance Static Standing - Balance Support: Bilateral upper extremity supported;During functional activity Static Standing - Level of Assistance: 5: Stand by assistance Dynamic Standing Balance Dynamic Standing - Balance Support: No upper extremity supported;During functional activity(while performing pericare) Dynamic Standing -  Level of Assistance: 5: Stand by assistance Extremity Assessment  RLE Assessment RLE Assessment: Exceptions to WFL(4/5 hip ms, 3/5 knee and ankle ms ) LLE Assessment LLE Assessment: Exceptions to WFL(5/5 hip and knee ms, 4/5 ankle ms )   See Function Navigator for Current Functional Status.   K Arnette 07/12/2017, 5:35 PM  

## 2017-07-12 NOTE — Discharge Summary (Signed)
NAMEBRANON, Charles NO.:  [id number]  MEDICAL RECORD NO.:  [id number]  LOCATION:                                 FACILITY:  PHYSICIAN:  [name], M.D.DATE OF BIRTH:  1940/09/05  DATE OF ADMISSION:  06/29/2017 DATE OF DISCHARGE:  07/13/2017                              DISCHARGE SUMMARY   DISCHARGE DIAGNOSES: 1. Previous left pontine infarct with dysarthria secondary to acute     ischemia, nonhemorrhagic infarct involving the posterior limb,     right internal capsule. 2. Chronic Coumadin for atrial fibrillation. 3. Dysphagia. 4. Atrial fibrillation. 5. Coronary artery disease with left ventricular apical thrombus. 6. Hypertension. 7. Chronic obstructive pulmonary disease with remote tobacco abuse. 8. Chronic systolic congestive heart failure. 9. Hyperlipidemia. 10.Vascular dementia. 11.Urinary incontinence, felt to be multifactorial.  A 77 year old right-handed male, history of CVA, left CEA, CAD with CABG, COPD, chronic systolic congestive heart failure, atrial fibrillation with intracardiac thrombus, maintained on Coumadin with medical noncompliance.  Lives with son.  Used a straight-point cane prior to admission.  Presented on June 25, 2017, with right-sided weakness, slurred speech.  INR on admission of 1.08.  Cranial CT scan unremarkable.  Remote left occipital lobe and bilateral parietal lobe infarcts.  CT angiogram of head and neck showed occlusion of the right common carotid artery at its mid aspect, age indeterminate.  Extensive atheromatous irregularity throughout the left ICA and MCA without large vessel occlusion.  The patient did not receive tPA.  MRI of the brain showed right internal capsule CVA, 2-cm acute ischemic nonhemorrhagic infarct involving the posterior limb of the right internal capsule. Echocardiogram with ejection fraction 45%, grade 1 diastolic dysfunction.  Chronic Coumadin ongoing.  Dysphagia #1 honey thick  liquid diet, advanced to a dysphagia #2 nectar thick after modified barium swallow.  The patient was admitted for comprehensive rehab program.  PAST MEDICAL HISTORY:  See discharge diagnoses.  SOCIAL HISTORY:  Lives with son.  Used a straight-point cane prior to admission.  FUNCTIONAL STATUS UPON ADMISSION TO REHAB SERVICES:  +2 physical assist ambulate 50 feet, moderate assist sit to supine, mod to max assist activities of daily living.  PHYSICAL EXAMINATION:  VITAL SIGNS:  Blood pressure 105/56, pulse 66, temperature 97, and respirations 17. GENERAL:  Alert male.  Expressive receptive aphasia.  Followed simple commands. HEENT:  EOMs intact. NECK:  Supple.  Nontender.  No JVD. CARDIAC:  Rate controlled. ABDOMEN:  Soft, nontender.  Good bowel sounds. LUNGS:  Clear to auscultation without wheeze.  REHABILITATION HOSPITAL COURSE:  The patient was admitted to Inpatient Rehab Services.  Therapies initiated on a 3-hour daily basis, consisting of physical therapy, occupational therapy, speech therapy, and rehabilitation nursing.  The following issues were addressed during the patient's rehabilitation stay.  Pertaining to Charles Hatfield previous left pontine infarct with dysarthria, acute ischemic nonhemorrhagic infarct, posterior limb right internal capsule, remained stable, maintained on aspirin and Coumadin therapy.  No bleeding episodes.  Noted history of poor medical compliance.  Had received counseling in regard to this as well as discussion with family.  Blood pressures overall remained controlled.  No chest pain or shortness of breath.  His diet dysphagia #1 nectar liquids.  Cardiac rate controlled.  He was followed by Cardiology Services for CAD, left ventricular apical thrombus.  CABG in 2013.  He will continue chronic Coumadin as well as aspirin.  COPD, remote tobacco abuse, tapered from a NicoDerm patch.  Chest x-ray negative.  Again receiving counseling in regard to  cessation of nicotine products.  He exhibited no other signs of fluid overload.  Vascular dementia secondary to multi-infarct, small vessel disease.  He was cooperative with therapies.  The patient received weekly collaborative interdisciplinary team conferences to discuss estimated length of stay, family teaching, any barriers to discharge.  Lower body dressing from edge of bed with supervision for dynamic balance, stand pivot throughout sessions with minimal guard without rolling walker.  Sit to stand, supervision, rolling walker.  Ambulating 125 feet, rolling walker, supervision, activities of daily living and homemaking, working with energy conservation techniques.  He can don and doff his shoes and socks supervision, communicate his full needs.  Family teaching completed and plan discharge to home.  DISCHARGE MEDICATIONS: 1. Aspirin 81 mg p.o. daily. 2. Lipitor 80 mg p.o. daily. 3. Nicoderm patch 7 mg daily x3 weeks and stop. 4. Ditropan 5 mg p.o. at bedtime. 5. MiraLAX daily, hold for loose stool.  Coumadin latest dose of 2.5     mg, adjusted accordingly for INR of 2.00 to 3.00. 6. Tylenol as needed.  DIET:  His diet was a dysphagia #1 nectar thick liquid diet.  FOLLOWUP:  He would follow up with Dr. Claudette Laws at the Outpatient Rehab Service office as directed; Dr. Roda Shutters, Neurology Services, call for appointment; Dr. Royann Shivers, call for appointment.  Home health nurse arranged to check INR on July 17, 2017; results to [facility], [phone], fax [phone].  Also advised no driving or smoking.     Mariam Dollar, P.A.   ______________________________ [name], M.D.    DA/MEDQ  D:  07/12/2017  T:  07/12/2017  Job:  [id number]  cc:   [name], M.D. Dr. Derwood Kaplan, MD

## 2017-07-12 NOTE — Progress Notes (Signed)
ANTICOAGULATION CONSULT NOTE  Pharmacy Consult for Warfarin Indication: atrial fibrillation  No Known Allergies  Patient Measurements: Height: 5\' 6"  (167.6 cm) Weight: 127 lb 14.4 oz (58 kg) IBW/kg (Calculated) : 63.8  Vital Signs: Temp: 97.6 F (36.4 C) (01/17 0546) Temp Source: Oral (01/17 0546) BP: 129/56 (01/17 0546) Pulse Rate: 58 (01/17 0546)  Labs: Recent Labs    07/10/17 0732 07/11/17 0812 07/12/17 0556  LABPROT 23.2* 23.3* 25.9*  INR 2.08 2.10 2.40   Estimated Creatinine Clearance: 44.8 mL/min (by C-G formula based on SCr of 1.15 mg/dL).  Assessment: 35 yoM with PMH afib, prior CVA, admitted for stroke sx but not a TPA candidate. Patient supposed to be taking warfarin PTA but does not appear to be, INR ~1.0 on admit. Per neuro ok to resume warfarin while admitted, no bridging needed. INR 2.4, therapeutic. Hgb 14.9, pltc 150 K, stable. No bleeding noted per chart. Plan for discharge tomorrow  Goal of Therapy: INR 2-3   Plan: Continue Warfarin 5mg  on Tuesday and Saturdays, 2.5 mg all other days Daily INR Monitor for s/sx bleeding   Bayard Hugger, PharmD, BCPS  Clinical Pharmacist  Pager: [phone]   07/12/2017 1:56 PM

## 2017-07-12 NOTE — Progress Notes (Signed)
Occupational Therapy Discharge Summary  Patient Details  Name: Charles Hatfield MRN: [id number] Date of Birth: 1941-03-06  Patient has met 10 of 10 long term goals due to improved activity tolerance, improved balance, postural control, ability to compensate for deficits, functional use of  RIGHT upper and RIGHT lower extremity, improved attention, improved awareness and improved coordination.  Patient to discharge at [facility] Assist level.  Patient's care partner is independent to provide the necessary physical and cognitive assistance at discharge.    Reasons goals not met: n/a  Recommendation:  Patient will benefit from ongoing skilled OT services in home health setting to continue to advance functional skills in the area of BADL.  Equipment: RW  Reasons for discharge: treatment goals met and discharge from hospital  Patient/family agrees with progress made and goals achieved: Yes  OT Discharge Precautions/Restrictions  Precautions Precautions: Fall Precaution Comments: blind in R eye Restrictions Weight Bearing Restrictions: No ADL ADL Eating: Set up, Supervision/safety, Moderate cueing Grooming: Setup, Supervision/safety Upper Body Bathing: Supervision/safety, Setup Lower Body Bathing: Setup, Supervision/safety Upper Body Dressing: Supervision/safety, Setup Lower Body Dressing: Setup, Supervision/safety Toileting: Supervision/safety, Setup Toilet Transfer: Close supervision Toilet Transfer Method: Magazine features editor: Close supervision Social research officer, government Method: Heritage manager: Civil engineer, contracting with back Vision Additional Comments: R visual field cut Praxis Praxis: Intact Cognition Arousal/Alertness: Awake/alert Orientation Level: Oriented X4 Problem Solving: Impaired Problem Solving Impairment: Functional basic Safety/Judgment: Impaired Comments: decreased awareness of deficits  Sensation Sensation Light Touch: Appears  Intact Hot/Cold: Appears Intact Coordination Gross Motor Movements are Fluid and Coordinated: No Fine Motor Movements are Fluid and Coordinated: No Coordination and Movement Description: Impaired LE coordination observed w/ all functional movement, moves at very slow and cautious pace Motor  Motor Motor: Hemiplegia Motor - Skilled Clinical Observations: mild R hemi Mobility  Bed Mobility Bed Mobility: Supine to Sit;Sit to Supine;Rolling Right;Rolling Left Rolling Right: 5: Supervision Rolling Right Details: Verbal cues for technique Rolling Left: 5: Supervision Rolling Left Details: Verbal cues for technique Supine to Sit: 5: Supervision Supine to Sit Details: Verbal cues for technique Sit to Supine: 5: Supervision Transfers Sit to Stand: 5: Supervision Stand to Sit: 5: Supervision  Balance Balance Balance Assessed: Yes Static Standing Balance Static Standing - Balance Support:During functional activity Static Standing - Level of Assistance: 5: Stand by assistance Dynamic Standing Balance Dynamic Standing - Balance Support: During functional activity(while performing pericare) Dynamic Standing - Level of Assistance: 5: Stand by assistance Extremity/Trunk Assessment RUE Assessment RUE Assessment: Within Functional Limits LUE Assessment LUE Assessment: Within Functional Limits   See Function Navigator for Current Functional Status.  Charles Hatfield Charles Hatfield 07/12/2017, 3:17 PM

## 2017-07-12 NOTE — Progress Notes (Signed)
Occupational Therapy Session Note  Patient Details  Name: Charles Hatfield MRN: [id number] Date of Birth: 1941/04/21  Today's Date: 07/12/2017  Session 1 OT Individual Time: 0802-2336 OT Individual Time Calculation (min): 57 min   Session 2 OT Individual Time: 1333-1415 OT Individual Time Calculation (min): 42 min   Short Term Goals: Week 2:  OT Short Term Goal 1 (Week 2): STG=LTG 2/2 ELOS  Skilled Therapeutic Interventions/Progress Updates:  Session 1   OT treatment session focused on increased independence with bathing/dressing tasks. Pt ambulated to bathroom w/ RW and min cues to avoid objects on R side. Pt transferred onto commode and completed 3/3 toileting steps after voiding bladder. Pt ambulated into shower with similar cues. Bathing/dressing completed with overall set-up/supervision A. Pt left semi-reclined in bed with bed alarm on and needs met.  Session 2 OT treatment session focused on vision and pt/family education. Pt ambulated to therapy gym and worked on Programme researcher, broadcasting/film/video. Had pt follow his hand from L side of visual field to the R to locate lights. Pt easily frustrated with activity. Pt's son present for 2nd half of session and practiced functional ambulation with pt. Pt's son did a good job cuing pt to avoid objects and for safety techniques. Provided pt;s son with home fine motor program hand out and thera-putty exercise handout. Pt left seated in wc with safety belt on, son present, and needs met.   Therapy Documentation Precautions:  Precautions Precautions: Fall Precaution Comments: blind in R eye Restrictions Weight Bearing Restrictions: No  See Function Navigator for Current Functional Status.   Therapy/Group: Individual Therapy  [name] 07/12/2017, 2:16 PM

## 2017-07-13 DIAGNOSIS — I6312 Cerebral infarction due to embolism of basilar artery: Secondary | ICD-10-CM

## 2017-07-13 DIAGNOSIS — I482 Chronic atrial fibrillation: Secondary | ICD-10-CM

## 2017-07-13 LAB — PROTIME-INR
INR: 1.92
Prothrombin Time: 21.8 seconds — ABNORMAL HIGH (ref 11.4–15.2)

## 2017-07-13 MED ORDER — ATORVASTATIN CALCIUM 80 MG PO TABS: 80.0000 mg | ORAL_TABLET | Freq: Every day | ORAL | 0 refills | Status: DC

## 2017-07-13 MED ORDER — WARFARIN SODIUM 2.5 MG PO TABS: 2.5000 mg | ORAL_TABLET | Freq: Every day | ORAL | 11 refills | Status: DC

## 2017-07-13 MED ORDER — RESOURCE THICKENUP CLEAR PO POWD: 3.0000 | ORAL | 0 refills | Status: DC | PRN

## 2017-07-13 MED ORDER — NICOTINE 7 MG/24HR TD PT24: MEDICATED_PATCH | TRANSDERMAL | 0 refills | Status: DC

## 2017-07-13 MED ORDER — WARFARIN SODIUM 5 MG PO TABS: ORAL_TABLET | ORAL | 2 refills | Status: DC

## 2017-07-13 MED ORDER — POLYETHYLENE GLYCOL 3350 17 G PO PACK: 17.0000 g | PACK | Freq: Every day | ORAL | 0 refills | Status: DC

## 2017-07-13 MED ORDER — OXYBUTYNIN CHLORIDE 5 MG PO TABS: 5.0000 mg | ORAL_TABLET | Freq: Every day | ORAL | 0 refills | Status: DC

## 2017-07-13 MED ORDER — NITROGLYCERIN 0.4 MG SL SUBL: SUBLINGUAL_TABLET | SUBLINGUAL | 2 refills | Status: DC

## 2017-07-13 NOTE — Progress Notes (Signed)
Social Work Patient ID: Charles Hatfield, male   DOB: 03-24-1941, 77 y.o.   MRN: [id number]     Charles Hatfield, Charles Hatfield  Social Worker    Patient Care Conference  Signed  Date of Service:  07/13/2017 10:49 AM          Signed          [] Hide copied text  [] Hover for details   Inpatient RehabilitationTeam Conference and Plan of Care Update Date: 07/11/2017   Time: 11:15 AM      Patient Name: Charles Hatfield      Medical Record Number: [id number]  Date of Birth: 1940-10-01 Sex: Male         Room/Bed: 4W13C/4W13C-01 Payor Info: Payor: MEDICARE / Plan: MEDICARE PART A AND B / Product Type: *No Product type* /     Admitting Diagnosis: CVA  Admit Date/Time:  06/28/2017  5:49 PM Admission Comments: No comment available    Primary Diagnosis:  <principal problem not specified> Principal Problem: <principal problem not specified>       Patient Active Problem List    Diagnosis Date Noted  . Atrial fibrillation (HCC)    . Chronic systolic congestive heart failure (HCC)    . Vascular dementia without behavioral disturbance    . Embolic cerebral infarction (HCC) 06/28/2017  . Hemiparesis affecting right side as late effect of cerebrovascular accident (CVA) (HCC)    . Residual cognitive deficit as late effect of stroke    . Dysphagia as late effect of stroke    . Chronic combined systolic and diastolic CHF (congestive heart failure) (HCC)    . Benign essential HTN    . PAF (paroxysmal atrial fibrillation) (HCC)    . Intracardiac thrombosis    . Tachypnea    . Right hemiparesis (HCC) 06/25/2017  . Dysarthria 06/25/2017  . Dysphagia 06/25/2017  . CVA (cerebral vascular accident) (HCC) 06/25/2017  . Long term (current) use of anticoagulants 06/25/2017  . Mixed hyperlipidemia 06/15/2013  . CKD (chronic kidney disease) stage 3, GFR 30-59 ml/min (HCC) 03/02/2013  . Critical lower limb ischemia 02/10/2013  . Preop cardiovascular exam 12/18/2012  . Long term current use of  anticoagulant therapy 09/12/2012  . Occlusion and stenosis of carotid artery without mention of cerebral infarction 02/13/2012  . S/P CABG x 3     01/23/12 01/24/2012  . S/P carotid endarterectomy   Lt, 01/23/12 01/24/2012  . Carotid arterial disease, severe LICA stenosis 01/18/2012  . History of stroke 01/18/2012  . CAD (coronary artery disease) status post CABG July 2013 01/17/2012  . COPD (chronic obstructive pulmonary disease) (HCC) 01/17/2012  . Acute renal insufficiency, SCr up to 1.43 01/17/2012  . Ischemic cardiomyopathy, EF 40-45% 2D 01/15/12 01/17/2012  . Staghorn renal calculus 01/15/2012  . Left ventricular apical thrombus following MI, NSTEMI, 01/13/12 01/15/2012  . DM (diabetes mellitus) (HCC) 01/15/2012  . NSTEMI (non-ST elevated myocardial infarction) (HCC) 01/14/2012  . HTN (hypertension) 01/14/2012  . Thrombocytopenia (HCC) 01/14/2012  . Tobacco abuse 01/14/2012  . H/O ascending aorta repair        Expected Discharge Date: Expected Discharge Date: 07/13/17   Team Members Present: Physician leading conference: Dr. Claudette Laws Social Worker Present: Staci Acosta, LCSW Nurse Present: Kennyth Arnold, RN PT Present: Teodoro Kil, PT OT Present: Kearney Hard, OT SLP Present: Feliberto Gottron, SLP PPS Coordinator present : Tora Duck, RN, CRRN       Current Status/Progress Goal Weekly Team Focus  Medical  Cognition improving, Cough better  reduce fall risk, reduce asp risk  increase orientation and attnetion   Bowel/Bladder     continent of bowel/bladder with urgency. Last BM 07/09/17  Patient to be able to have regular bowel/bladder function with min assist  assess bowel/bladder function qshift and as needed   Swallow/Nutrition/ Hydration     Dys. 1 textures with nectar-thick liquids, Min-Mod A  Min A  tolerance of current diet, family education    ADL's     Min A/supervision  Supervision  low vision compensation, modified bathing/dressing, activity  tolerance, safety awareness, pt/family education   Mobility     supervision mostly, occassional min guard  supervision overall, limited community ambulation with RW  gait training, NMR, safety awareness, ongoing family ed and d/c planning    Communication     Mod A  Mod A  use of speech intelligibility goals    Safety/Cognition/ Behavioral Observations   MIn A   Min A  attention, problem solving, recall    Pain     no complaints of pain  <2   assess and treat pain qshift and PRN   Skin     L knee abrasion, foam dressing in place  no new skin breakdown/infection.  assess skin qshift and PRN     Rehab Goals Patient on target to meet rehab goals: Yes Rehab Goals Revised: none *See Care Plan and progress notes for long and short-term goals.      Barriers to Discharge   Current Status/Progress Possible Resolutions Date Resolved   Physician     Inaccessible home environment;Decreased caregiver support     Cont rehab  cont rehab      Nursing                 PT                    OT                 SLP            SW              Discharge Planning/Teaching Needs:  Pt to return to the extended stay motel where pt and son are staying with son to provide supervision level care.  Son has completed family education.   Team Discussion:  Per Dr. Wynn Banker, pt's cough and dysarthria are better.  Pt with some cognitive impairments from multi-infarct dementia.  RN reported that pt's memory is poor and ST confirmed this.  As a result, pt needs a lot of cueing with swallowing which frustrates pt.  His swallowing has been inconsistent and he has a delayed initiation of the swallow.  Pt with overgrown toenail which he complains hurts him when he is walking with PT.  Pt is supervision to min A with gait, but expected to reach supervision by d/c.  Pt can be impulsive at times and will lose his balance.  He needs visual cues with OT, but is at min A approaching S.  Pt needs HH and RW for d/c.    Revisions to Treatment Plan:  none    Continued Need for Acute Rehabilitation Level of Care: The patient requires daily medical management by a physician with specialized training in physical medicine and rehabilitation for the following conditions: Daily direction of a multidisciplinary physical rehabilitation program to ensure safe treatment while eliciting the highest outcome that is of practical value to the patient.:  Yes Daily medical management of patient stability for increased activity during participation in an intensive rehabilitation regime.: Yes Daily analysis of laboratory values and/or radiology reports with any subsequent need for medication adjustment of medical intervention for : Pulmonary problems;Nutritional problems   Najir Roop, Vista Deck 07/13/2017, 10:49 AM

## 2017-07-13 NOTE — Progress Notes (Signed)
Social Work Discharge Note  The overall goal for the admission was met for:   Discharge location: Yes - home with son to provide supervision  Length of Stay: Yes - 15 days  Discharge activity level: Yes - supervision  Home/community participation: Yes  Services provided included: MD, RD, PT, OT, SLP, RN, Pharmacy, Neuropsych and SW  Financial Services: Medicare and Private Insurance: [employer]  Follow-up services arranged: Home Health: PT/OT/ST/RN from [name], DME: Rolling walker from [facility] and Patient/Family has no preference for HH/DME agencies  Comments (or additional information): Pt's son has been trained on pt's care at home, especially his diet and swallowing strategies.  Pt will have f/u home care.  CSW set pt up with new PCP.  Gave pt/son info on how to order magic cups from cafeteria.  Reminded son of ST recommendations for diet and that he can purchase pureed foods at the grocery.  Patient/Family verbalized understanding of follow-up arrangements: Yes  Individual responsible for coordination of the follow-up plan: pt's son, Salil Raineri  Confirmed correct DME delivered: Trey Sailors 07/13/2017    Dailey Buccheri, Silvestre Mesi

## 2017-07-13 NOTE — Patient Care Conference (Signed)
Inpatient RehabilitationTeam Conference and Plan of Care Update Date: 07/11/2017   Time: 11:15 AM    Patient Name: Charles Hatfield      Medical Record Number: [id number]  Date of Birth: [birth year] Sex: Male         Room/Bed: 4W13C/4W13C-01 Payor Info: Payor: MEDICARE / Plan: MEDICARE PART A AND B / Product Type: *No Product type* /    Admitting Diagnosis: CVA  Admit Date/Time:  06/28/2017  5:49 PM Admission Comments: No comment available   Primary Diagnosis:  <principal problem not specified> Principal Problem: <principal problem not specified>  Patient Active Problem List   Diagnosis Date Noted  . Atrial fibrillation (HCC)   . Chronic systolic congestive heart failure (HCC)   . Vascular dementia without behavioral disturbance   . Embolic cerebral infarction (HCC) 06/28/2017  . Hemiparesis affecting right side as late effect of cerebrovascular accident (CVA) (HCC)   . Residual cognitive deficit as late effect of stroke   . Dysphagia as late effect of stroke   . Chronic combined systolic and diastolic CHF (congestive heart failure) (HCC)   . Benign essential HTN   . PAF (paroxysmal atrial fibrillation) (HCC)   . Intracardiac thrombosis   . Tachypnea   . Right hemiparesis (HCC) 06/25/2017  . Dysarthria 06/25/2017  . Dysphagia 06/25/2017  . CVA (cerebral vascular accident) (HCC) 06/25/2017  . Long term (current) use of anticoagulants 06/25/2017  . Mixed hyperlipidemia 06/15/2013  . CKD (chronic kidney disease) stage 3, GFR 30-59 ml/min (HCC) 03/02/2013  . Critical lower limb ischemia 02/10/2013  . Preop cardiovascular exam 12/18/2012  . Long term current use of anticoagulant therapy 09/12/2012  . Occlusion and stenosis of carotid artery without mention of cerebral infarction 02/13/2012  . S/P CABG x 3     01/23/12 01/24/2012  . S/P carotid endarterectomy   Lt, 01/23/12 01/24/2012  . Carotid arterial disease, severe LICA stenosis 01/18/2012  . History of stroke 01/18/2012  . CAD  (coronary artery disease) status post CABG July 2013 01/17/2012  . COPD (chronic obstructive pulmonary disease) (HCC) 01/17/2012  . Acute renal insufficiency, SCr up to 1.43 01/17/2012  . Ischemic cardiomyopathy, EF 40-45% 2D 01/15/12 01/17/2012  . Staghorn renal calculus 01/15/2012  . Left ventricular apical thrombus following MI, NSTEMI, 01/13/12 01/15/2012  . DM (diabetes mellitus) (HCC) 01/15/2012  . NSTEMI (non-ST elevated myocardial infarction) (HCC) 01/14/2012  . HTN (hypertension) 01/14/2012  . Thrombocytopenia (HCC) 01/14/2012  . Tobacco abuse 01/14/2012  . H/O ascending aorta repair     Expected Discharge Date: Expected Discharge Date: 07/13/17  Team Members Present: Physician leading conference: Dr. Claudette Laws Social Worker Present: Staci Acosta, LCSW Nurse Present: Kennyth Arnold, RN PT Present: Teodoro Kil, PT OT Present: Kearney Hard, OT SLP Present: Feliberto Gottron, SLP PPS Coordinator present : Tora Duck, RN, CRRN     Current Status/Progress Goal Weekly Team Focus  Medical   Cognition improving, Cough better  reduce fall risk, reduce asp risk  increase orientation and attnetion   Bowel/Bladder   continent of bowel/bladder with urgency. Last BM 07/09/17  Patient to be able to have regular bowel/bladder function with min assist  assess bowel/bladder function qshift and as needed   Swallow/Nutrition/ Hydration   Dys. 1 textures with nectar-thick liquids, Min-Mod A  Min A  tolerance of current diet, family education    ADL's   Min A/supervision  Supervision  low vision compensation, modified bathing/dressing, activity tolerance, safety awareness, pt/family education   Mobility  supervision mostly, occassional min guard  supervision overall, limited community ambulation with RW  gait training, NMR, safety awareness, ongoing family ed and d/c planning    Communication   Mod A  Mod A  use of speech intelligibility goals    Safety/Cognition/  Behavioral Observations  MIn A   Min A  attention, problem solving, recall    Pain   no complaints of pain  <2   assess and treat pain qshift and PRN   Skin   L knee abrasion, foam dressing in place  no new skin breakdown/infection.  assess skin qshift and PRN    Rehab Goals Patient on target to meet rehab goals: Yes Rehab Goals Revised: none *See Care Plan and progress notes for long and short-term goals.     Barriers to Discharge  Current Status/Progress Possible Resolutions Date Resolved   Physician    Inaccessible home environment;Decreased caregiver support     Cont rehab  cont rehab      Nursing                  PT                    OT                  SLP                SW                Discharge Planning/Teaching Needs:  Pt to return to the extended stay motel where pt and son are staying with son to provide supervision level care.  Son has completed family education.   Team Discussion:  Per Dr. Wynn Banker, pt's cough and dysarthria are better.  Pt with some cognitive impairments from multi-infarct dementia.  RN reported that pt's memory is poor and ST confirmed this.  As a result, pt needs a lot of cueing with swallowing which frustrates pt.  His swallowing has been inconsistent and he has a delayed initiation of the swallow.  Pt with overgrown toenail which he complains hurts him when he is walking with PT.  Pt is supervision to min A with gait, but expected to reach supervision by d/c.  Pt can be impulsive at times and will lose his balance.  He needs visual cues with OT, but is at min A approaching S.  Pt needs HH and RW for d/c.  Revisions to Treatment Plan:  none    Continued Need for Acute Rehabilitation Level of Care: The patient requires daily medical management by a physician with specialized training in physical medicine and rehabilitation for the following conditions: Daily direction of a multidisciplinary physical rehabilitation program to ensure safe  treatment while eliciting the highest outcome that is of practical value to the patient.: Yes Daily medical management of patient stability for increased activity during participation in an intensive rehabilitation regime.: Yes Daily analysis of laboratory values and/or radiology reports with any subsequent need for medication adjustment of medical intervention for : Pulmonary problems;Nutritional problems  Ronon Ferger, Vista Deck 07/13/2017, 10:49 AM

## 2017-07-13 NOTE — Progress Notes (Signed)
Social Work Patient ID: Charles Hatfield, male   DOB: 07-Feb-1941, 77 y.o.   MRN: [id number]   CSW met with pt and spoke with son via telephone to update them on team conference discussion.  No change was made to targeted d/c date of 07-13-17.  More family education to be completed and this has occurred.  Pt will need HH f/u for RN/PT/OT/ST and CSW will arrange this.  Pt also needs a rolling walker and CSW ordered this and established pt with a new PCP.  Son reports that he and pt are moving into more permanent housing at the beginning of the month.  CSW remains available to assist as needed.

## 2017-07-13 NOTE — Progress Notes (Signed)
Subjective/Complaints:  No issues overnite  Review of systems: Limited, but appears to deny CP, SOB, nausea, vomiting, diarrhea.  Objective: Vital Signs: Blood pressure (!) 121/55, pulse 64, temperature 98.2 F (36.8 C), temperature source Oral, resp. rate 18, height 5\' 6"  (1.676 m), weight 58 kg (127 lb 14.4 oz), SpO2 98 %. No results found. Results for orders placed or performed during the hospital encounter of 06/28/17 (from the past 72 hour(s))  Protime-INR     Status: Abnormal   Collection Time: 07/10/17  7:32 AM  Result Value Ref Range   Prothrombin Time 23.2 (H) 11.4 - 15.2 seconds   INR 2.08   Protime-INR     Status: Abnormal   Collection Time: 07/11/17  8:12 AM  Result Value Ref Range   Prothrombin Time 23.3 (H) 11.4 - 15.2 seconds   INR 2.10   Protime-INR     Status: Abnormal   Collection Time: 07/12/17  5:56 AM  Result Value Ref Range   Prothrombin Time 25.9 (H) 11.4 - 15.2 seconds   INR 2.40      HEENT: Atraumatic normocephalic Cardio: Irregularly irregular. No JVD. Resp: CTA B/L and unlaboured GI: BS positive and Nontender nondistended Skin:   Intact. Warm and dry Neuro: Alert  Motor:  4-/5 in the right deltoid bicep tricep grip hip flexion knee extension ankle dorsiflexion (unchanged), 5/5 on left side Musc/Skel:  No edema. No tenderness. General no acute distress. Vital signs reviewed.   Assessment/Plan: 1. Functional deficits secondary to Right side weakness secondary to previous left pontine infarct with dysarthria secondary to acute ischemic nonhemorrhagic infarct involving the posterior limb right internal capsule    Stable for D/C today F/u PCP in 3-4 weeks F/u PM&R 2 weeks See D/C summary See D/C instructions FIM: Function - Bathing Position: Shower Body parts bathed by patient: Right arm, Left upper leg, Right lower leg, Left arm, Abdomen, Chest, Left lower leg, Front perineal area, Right upper leg, Buttocks Body parts bathed by helper:  Buttocks, Back Assist Level: Supervision or verbal cues, Set up  Function- Upper Body Dressing/Undressing What is the patient wearing?: Pull over shirt/dress Pull over shirt/dress - Perfomed by patient: Thread/unthread left sleeve, Thread/unthread right sleeve, Put head through opening, Pull shirt over trunk Assist Level: Set up Set up : To obtain clothing/put away Function - Lower Body Dressing/Undressing What is the patient wearing?: Non-skid slipper socks, Underwear, Pants Position: Wheelchair/chair at sink Underwear - Performed by patient: Thread/unthread right underwear leg, Thread/unthread left underwear leg, Pull underwear up/down Underwear - Performed by helper: Pull underwear up/down Pants- Performed by patient: Thread/unthread right pants leg, Thread/unthread left pants leg, Pull pants up/down Pants- Performed by helper: Pull pants up/down Non-skid slipper socks- Performed by patient: Don/doff right sock, Don/doff left sock Assist for footwear: Supervision/touching assist Assist for lower body dressing: Supervision or verbal cues, Set up Set up : To obtain clothing/put away  Function - Toileting Toileting steps completed by patient: Adjust clothing prior to toileting, Performs perineal hygiene, Adjust clothing after toileting Toileting steps completed by helper: Adjust clothing prior to toileting, Adjust clothing after toileting Toileting Assistive Devices: Grab bar or rail Assist level: Supervision or verbal cues  Function - Archivist transfer assistive device: Walker, Grab bar Assist level to toilet: Supervision or verbal cues Assist level from toilet: Supervision or verbal cues  Function - Chair/bed transfer Chair/bed transfer method: Stand pivot Chair/bed transfer assist level: Supervision or verbal cues Chair/bed transfer assistive device: Armrests, Walker Chair/bed transfer  details: Verbal cues for safe use of DME/AE, Verbal cues for technique, Verbal  cues for precautions/safety  Function - Locomotion: Wheelchair Will patient use wheelchair at discharge?: No Type: Manual Max wheelchair distance: 67' Assist Level: Supervision or verbal cues Assist Level: Supervision or verbal cues Wheel 150 feet activity did not occur: Safety/medical concerns Assist Level: Dependent (Pt equals 0%) Turns around,maneuvers to table,bed, and toilet,negotiates 3% grade,maneuvers on rugs and over doorsills: No Function - Locomotion: Ambulation Assistive device: Walker-rolling Max distance: 175' Assist level: Supervision or verbal cues Assist level: Supervision or verbal cues Walk 50 feet with 2 turns activity did not occur: Safety/medical concerns Assist level: Supervision or verbal cues Walk 150 feet activity did not occur: Safety/medical concerns Assist level: Supervision or verbal cues Walk 10 feet on uneven surfaces activity did not occur: Safety/medical concerns  Function - Comprehension Comprehension: Auditory Comprehension assist level: Understands basic 90% of the time/cues < 10% of the time  Function - Expression Expression: Verbal Expression assist level: Expresses basic 75 - 89% of the time/requires cueing 10 - 24% of the time. Needs helper to occlude trach/needs to repeat words.  Function - Social Interaction Social Interaction assist level: Interacts appropriately 90% of the time - Needs monitoring or encouragement for participation or interaction.  Function - Problem Solving Problem solving assist level: Solves basic 50 - 74% of the time/requires cueing 25 - 49% of the time  Function - Memory Memory assist level: Recognizes or recalls 50 - 74% of the time/requires cueing 25 - 49% of the time Patient normally able to recall (first 3 days only): Current season, That he or she is in a hospital  Medical Problem List and Plan:  1. Right side weakness secondary to previous left pontine infarct with dysarthria secondary to acute ischemic  nonhemorrhagic infarct involving the posterior limb right internal capsule  Also has left field cut with neglect due to old R PCA infarct Stable for D/C today 2. DVT Prophylaxis/Anticoagulation: Chronic Coumadin.  History of poor compliance monitor for any bleeding episodes    INR therapeutic no evidence of bleeding, Hgb stable 3. Pain Management: Tylenol as needed  4. Mood: Provide emotional support  5. Neuropsych: This patient is not fully capable of making decisions on his own behalf.  6. Skin/Wound Care: Routine skin checks  7. Fluids/Electrolytes/Nutrition: Routine I&O's Intake   BMET stable on 1/11 8. Dysphagia. Dysphagia #1 nectar liquids. Monitor hydration with follow-up speech therapy  9. Atrial fibrillation. Cardiac rate controlled. Continue Coumadin  10. CAD/left ventricular apical thrombus/NSTEMI with CABG 2013. No chest pain noted. Continue aspirin  11. Hypertension. Monitor with increased mobility  Vitals:   07/12/17 1417 07/13/17 0225  BP: (!) 130/47 (!) 121/55  Pulse: 69 64  Resp: 18 18  Temp:  98.2 F (36.8 C)  SpO2: 97% 98%    BP controlled on 1/18, currently not on any hypertensive meds 12. COPD with remote tobacco abuse. Continue albuterol treatments as needed appears compensated Cough- likely related to dysphagia CXR neg 13. Chronic systolic congestive heart failure. Monitor for any signs of fluid overload  Filed Weights   06/28/17 1820 07/04/17 0803 07/11/17 1614  Weight: 59.4 kg (130 lb 15.3 oz) 57.7 kg (127 lb 3.3 oz) 58 kg (127 lb 14.4 oz)  No peripheral edema, weighs down 14. Hyperlipidemia. Lipitor  15. Vascular dementia secondary to multi-infarcts small vessel disease 16.  Urinary incont likely multifactorial, pt c/o at night will add low dose oxybutnin LOS (Days) 15  A FACE TO FACE EVALUATION WAS PERFORMED  [name] 07/13/2017, 7:15 AM

## 2017-07-16 ENCOUNTER — Telehealth: Payer: Self-pay | Admitting: Cardiovascular Disease

## 2017-07-16 NOTE — Telephone Encounter (Signed)
Left a message to call back. The patient has not been seen in 2 years.

## 2017-07-16 NOTE — Telephone Encounter (Signed)
New Message   [place] with [name] is calling to see if Dr. Royann Shivers is going to sign of on the Home Health paperwork. Please call.

## 2017-07-19 NOTE — Telephone Encounter (Signed)
Left message on voicemail --for [facility]  office has not received home health paerwork.  Dr Royann Shivers has not seen patient for 2 years ( 2017) please contact patient's pcp jessica eubanks n.p.  any question may call back.

## 2017-07-24 ENCOUNTER — Encounter: Payer: Federal, State, Local not specified - PPO | Admitting: Physical Medicine & Rehabilitation

## 2017-07-31 ENCOUNTER — Ambulatory Visit: Payer: Self-pay | Admitting: Nurse Practitioner

## 2017-08-02 ENCOUNTER — Inpatient Hospital Stay: Payer: Medicare Other | Admitting: Physical Medicine & Rehabilitation

## 2017-08-02 ENCOUNTER — Encounter: Payer: Medicare Other | Attending: Physical Medicine & Rehabilitation

## 2017-09-18 ENCOUNTER — Telehealth: Payer: Self-pay | Admitting: Pharmacist

## 2017-09-18 ENCOUNTER — Ambulatory Visit (INDEPENDENT_AMBULATORY_CARE_PROVIDER_SITE_OTHER): Payer: Federal, State, Local not specified - PPO | Admitting: Pharmacist

## 2017-09-18 DIAGNOSIS — I236 Thrombosis of atrium, auricular appendage, and ventricle as current complications following acute myocardial infarction: Secondary | ICD-10-CM

## 2017-09-18 DIAGNOSIS — Z7901 Long term (current) use of anticoagulants: Secondary | ICD-10-CM

## 2017-09-18 NOTE — Telephone Encounter (Signed)
LMOM; patient still taking warfarin? Who monitors is therapy?

## 2018-05-21 ENCOUNTER — Emergency Department (HOSPITAL_COMMUNITY): Payer: Medicare Other

## 2018-05-21 ENCOUNTER — Inpatient Hospital Stay (HOSPITAL_COMMUNITY)
Admission: EM | Admit: 2018-05-21 | Discharge: 2018-06-01 | DRG: 004 | Disposition: A | Discharge status: Other Acute Inpatient Hospital | Payer: Medicare Other | Attending: Pulmonary Disease | Admitting: Pulmonary Disease

## 2018-05-21 ENCOUNTER — Inpatient Hospital Stay (HOSPITAL_COMMUNITY): Payer: Medicare Other

## 2018-05-21 ENCOUNTER — Encounter (HOSPITAL_COMMUNITY): Payer: Self-pay | Admitting: Emergency Medicine

## 2018-05-21 DIAGNOSIS — I255 Ischemic cardiomyopathy: Secondary | ICD-10-CM | POA: Diagnosis present

## 2018-05-21 DIAGNOSIS — J9601 Acute respiratory failure with hypoxia: Secondary | ICD-10-CM | POA: Diagnosis present

## 2018-05-21 DIAGNOSIS — B9620 Unspecified Escherichia coli [E. coli] as the cause of diseases classified elsewhere: Secondary | ICD-10-CM | POA: Diagnosis present

## 2018-05-21 DIAGNOSIS — Z8249 Family history of ischemic heart disease and other diseases of the circulatory system: Secondary | ICD-10-CM | POA: Diagnosis not present

## 2018-05-21 DIAGNOSIS — I4819 Other persistent atrial fibrillation: Secondary | ICD-10-CM | POA: Diagnosis not present

## 2018-05-21 DIAGNOSIS — N183 Chronic kidney disease, stage 3 unspecified: Secondary | ICD-10-CM | POA: Diagnosis present

## 2018-05-21 DIAGNOSIS — I472 Ventricular tachycardia: Secondary | ICD-10-CM | POA: Diagnosis not present

## 2018-05-21 DIAGNOSIS — E872 Acidosis: Secondary | ICD-10-CM | POA: Diagnosis present

## 2018-05-21 DIAGNOSIS — J189 Pneumonia, unspecified organism: Secondary | ICD-10-CM | POA: Diagnosis not present

## 2018-05-21 DIAGNOSIS — K9289 Other specified diseases of the digestive system: Secondary | ICD-10-CM | POA: Diagnosis not present

## 2018-05-21 DIAGNOSIS — E43 Unspecified severe protein-calorie malnutrition: Secondary | ICD-10-CM | POA: Diagnosis present

## 2018-05-21 DIAGNOSIS — Z7982 Long term (current) use of aspirin: Secondary | ICD-10-CM | POA: Diagnosis not present

## 2018-05-21 DIAGNOSIS — Z720 Tobacco use: Secondary | ICD-10-CM | POA: Diagnosis present

## 2018-05-21 DIAGNOSIS — Z9911 Dependence on respirator [ventilator] status: Secondary | ICD-10-CM | POA: Diagnosis not present

## 2018-05-21 DIAGNOSIS — B9562 Methicillin resistant Staphylococcus aureus infection as the cause of diseases classified elsewhere: Secondary | ICD-10-CM | POA: Diagnosis present

## 2018-05-21 DIAGNOSIS — J9600 Acute respiratory failure, unspecified whether with hypoxia or hypercapnia: Secondary | ICD-10-CM | POA: Diagnosis not present

## 2018-05-21 DIAGNOSIS — Z87891 Personal history of nicotine dependence: Secondary | ICD-10-CM

## 2018-05-21 DIAGNOSIS — I5042 Chronic combined systolic (congestive) and diastolic (congestive) heart failure: Secondary | ICD-10-CM | POA: Diagnosis present

## 2018-05-21 DIAGNOSIS — E876 Hypokalemia: Secondary | ICD-10-CM | POA: Diagnosis present

## 2018-05-21 DIAGNOSIS — Z8673 Personal history of transient ischemic attack (TIA), and cerebral infarction without residual deficits: Secondary | ICD-10-CM

## 2018-05-21 DIAGNOSIS — R531 Weakness: Secondary | ICD-10-CM | POA: Diagnosis not present

## 2018-05-21 DIAGNOSIS — X58XXXA Exposure to other specified factors, initial encounter: Secondary | ICD-10-CM | POA: Diagnosis present

## 2018-05-21 DIAGNOSIS — I10 Essential (primary) hypertension: Secondary | ICD-10-CM | POA: Diagnosis present

## 2018-05-21 DIAGNOSIS — R6521 Severe sepsis with septic shock: Secondary | ICD-10-CM | POA: Diagnosis present

## 2018-05-21 DIAGNOSIS — D62 Acute posthemorrhagic anemia: Secondary | ICD-10-CM | POA: Diagnosis present

## 2018-05-21 DIAGNOSIS — D696 Thrombocytopenia, unspecified: Secondary | ICD-10-CM | POA: Diagnosis present

## 2018-05-21 DIAGNOSIS — R4020 Unspecified coma: Secondary | ICD-10-CM | POA: Diagnosis not present

## 2018-05-21 DIAGNOSIS — N189 Chronic kidney disease, unspecified: Secondary | ICD-10-CM | POA: Diagnosis not present

## 2018-05-21 DIAGNOSIS — L8990 Pressure ulcer of unspecified site, unspecified stage: Secondary | ICD-10-CM

## 2018-05-21 DIAGNOSIS — N390 Urinary tract infection, site not specified: Secondary | ICD-10-CM | POA: Diagnosis present

## 2018-05-21 DIAGNOSIS — J811 Chronic pulmonary edema: Secondary | ICD-10-CM | POA: Diagnosis not present

## 2018-05-21 DIAGNOSIS — I130 Hypertensive heart and chronic kidney disease with heart failure and stage 1 through stage 4 chronic kidney disease, or unspecified chronic kidney disease: Secondary | ICD-10-CM | POA: Diagnosis present

## 2018-05-21 DIAGNOSIS — A419 Sepsis, unspecified organism: Secondary | ICD-10-CM | POA: Diagnosis not present

## 2018-05-21 DIAGNOSIS — Z452 Encounter for adjustment and management of vascular access device: Secondary | ICD-10-CM

## 2018-05-21 DIAGNOSIS — K2211 Ulcer of esophagus with bleeding: Secondary | ICD-10-CM | POA: Diagnosis present

## 2018-05-21 DIAGNOSIS — K449 Diaphragmatic hernia without obstruction or gangrene: Secondary | ICD-10-CM | POA: Diagnosis present

## 2018-05-21 DIAGNOSIS — Z7901 Long term (current) use of anticoagulants: Secondary | ICD-10-CM

## 2018-05-21 DIAGNOSIS — I2510 Atherosclerotic heart disease of native coronary artery without angina pectoris: Secondary | ICD-10-CM | POA: Diagnosis present

## 2018-05-21 DIAGNOSIS — Z515 Encounter for palliative care: Secondary | ICD-10-CM | POA: Diagnosis not present

## 2018-05-21 DIAGNOSIS — R404 Transient alteration of awareness: Secondary | ICD-10-CM | POA: Diagnosis not present

## 2018-05-21 DIAGNOSIS — K210 Gastro-esophageal reflux disease with esophagitis: Secondary | ICD-10-CM | POA: Diagnosis not present

## 2018-05-21 DIAGNOSIS — F0150 Vascular dementia without behavioral disturbance: Secondary | ICD-10-CM | POA: Diagnosis present

## 2018-05-21 DIAGNOSIS — Z930 Tracheostomy status: Secondary | ICD-10-CM

## 2018-05-21 DIAGNOSIS — J449 Chronic obstructive pulmonary disease, unspecified: Secondary | ICD-10-CM | POA: Diagnosis present

## 2018-05-21 DIAGNOSIS — I69351 Hemiplegia and hemiparesis following cerebral infarction affecting right dominant side: Secondary | ICD-10-CM

## 2018-05-21 DIAGNOSIS — Z79899 Other long term (current) drug therapy: Secondary | ICD-10-CM | POA: Diagnosis not present

## 2018-05-21 DIAGNOSIS — J690 Pneumonitis due to inhalation of food and vomit: Secondary | ICD-10-CM | POA: Diagnosis not present

## 2018-05-21 DIAGNOSIS — Z66 Do not resuscitate: Secondary | ICD-10-CM | POA: Diagnosis not present

## 2018-05-21 DIAGNOSIS — G9340 Encephalopathy, unspecified: Secondary | ICD-10-CM | POA: Diagnosis not present

## 2018-05-21 DIAGNOSIS — N170 Acute kidney failure with tubular necrosis: Secondary | ICD-10-CM | POA: Diagnosis present

## 2018-05-21 DIAGNOSIS — I4891 Unspecified atrial fibrillation: Secondary | ICD-10-CM | POA: Diagnosis present

## 2018-05-21 DIAGNOSIS — E782 Mixed hyperlipidemia: Secondary | ICD-10-CM | POA: Diagnosis present

## 2018-05-21 DIAGNOSIS — G9341 Metabolic encephalopathy: Secondary | ICD-10-CM | POA: Diagnosis present

## 2018-05-21 DIAGNOSIS — Z9889 Other specified postprocedural states: Secondary | ICD-10-CM | POA: Diagnosis not present

## 2018-05-21 DIAGNOSIS — E878 Other disorders of electrolyte and fluid balance, not elsewhere classified: Secondary | ICD-10-CM | POA: Diagnosis present

## 2018-05-21 DIAGNOSIS — D72829 Elevated white blood cell count, unspecified: Secondary | ICD-10-CM | POA: Diagnosis not present

## 2018-05-21 DIAGNOSIS — S9031XA Contusion of right foot, initial encounter: Secondary | ICD-10-CM | POA: Diagnosis present

## 2018-05-21 DIAGNOSIS — I493 Ventricular premature depolarization: Secondary | ICD-10-CM | POA: Diagnosis present

## 2018-05-21 DIAGNOSIS — R0902 Hypoxemia: Secondary | ICD-10-CM | POA: Diagnosis not present

## 2018-05-21 DIAGNOSIS — I5022 Chronic systolic (congestive) heart failure: Secondary | ICD-10-CM | POA: Diagnosis not present

## 2018-05-21 DIAGNOSIS — Z0189 Encounter for other specified special examinations: Secondary | ICD-10-CM

## 2018-05-21 DIAGNOSIS — Z87442 Personal history of urinary calculi: Secondary | ICD-10-CM

## 2018-05-21 DIAGNOSIS — Z951 Presence of aortocoronary bypass graft: Secondary | ICD-10-CM

## 2018-05-21 DIAGNOSIS — I6529 Occlusion and stenosis of unspecified carotid artery: Secondary | ICD-10-CM | POA: Diagnosis present

## 2018-05-21 DIAGNOSIS — J9621 Acute and chronic respiratory failure with hypoxia: Secondary | ICD-10-CM | POA: Diagnosis not present

## 2018-05-21 DIAGNOSIS — K920 Hematemesis: Secondary | ICD-10-CM | POA: Diagnosis not present

## 2018-05-21 DIAGNOSIS — I340 Nonrheumatic mitral (valve) insufficiency: Secondary | ICD-10-CM | POA: Diagnosis not present

## 2018-05-21 DIAGNOSIS — R633 Feeding difficulties: Secondary | ICD-10-CM | POA: Diagnosis not present

## 2018-05-21 DIAGNOSIS — I480 Paroxysmal atrial fibrillation: Secondary | ICD-10-CM | POA: Diagnosis present

## 2018-05-21 DIAGNOSIS — R1310 Dysphagia, unspecified: Secondary | ICD-10-CM | POA: Diagnosis not present

## 2018-05-21 DIAGNOSIS — I959 Hypotension, unspecified: Secondary | ICD-10-CM | POA: Diagnosis not present

## 2018-05-21 DIAGNOSIS — E785 Hyperlipidemia, unspecified: Secondary | ICD-10-CM | POA: Diagnosis present

## 2018-05-21 DIAGNOSIS — K222 Esophageal obstruction: Secondary | ICD-10-CM | POA: Diagnosis present

## 2018-05-21 DIAGNOSIS — R0689 Other abnormalities of breathing: Secondary | ICD-10-CM | POA: Diagnosis not present

## 2018-05-21 DIAGNOSIS — E1122 Type 2 diabetes mellitus with diabetic chronic kidney disease: Secondary | ICD-10-CM | POA: Diagnosis present

## 2018-05-21 DIAGNOSIS — E119 Type 2 diabetes mellitus without complications: Secondary | ICD-10-CM

## 2018-05-21 DIAGNOSIS — Z9289 Personal history of other medical treatment: Secondary | ICD-10-CM

## 2018-05-21 DIAGNOSIS — I351 Nonrheumatic aortic (valve) insufficiency: Secondary | ICD-10-CM | POA: Diagnosis not present

## 2018-05-21 DIAGNOSIS — K3189 Other diseases of stomach and duodenum: Secondary | ICD-10-CM | POA: Diagnosis present

## 2018-05-21 DIAGNOSIS — Z7189 Other specified counseling: Secondary | ICD-10-CM | POA: Diagnosis not present

## 2018-05-21 DIAGNOSIS — R579 Shock, unspecified: Secondary | ICD-10-CM | POA: Diagnosis not present

## 2018-05-21 DIAGNOSIS — I252 Old myocardial infarction: Secondary | ICD-10-CM

## 2018-05-21 DIAGNOSIS — I712 Thoracic aortic aneurysm, without rupture: Secondary | ICD-10-CM | POA: Diagnosis present

## 2018-05-21 DIAGNOSIS — Z6820 Body mass index (BMI) 20.0-20.9, adult: Secondary | ICD-10-CM

## 2018-05-21 DIAGNOSIS — R000 Tachycardia, unspecified: Secondary | ICD-10-CM | POA: Diagnosis not present

## 2018-05-21 LAB — LIPASE, BLOOD: LIPASE: 18 U/L (ref 11–51)

## 2018-05-21 LAB — COMPREHENSIVE METABOLIC PANEL
ALT: 17 U/L (ref 0–44)
ANION GAP: 21 — AB (ref 5–15)
AST: 39 U/L (ref 15–41)
Albumin: 2.4 g/dL — ABNORMAL LOW (ref 3.5–5.0)
Alkaline Phosphatase: 68 U/L (ref 38–126)
BUN: 39 mg/dL — ABNORMAL HIGH (ref 8–23)
CHLORIDE: 110 mmol/L (ref 98–111)
CO2: 15 mmol/L — AB (ref 22–32)
CREATININE: 2.97 mg/dL — AB (ref 0.61–1.24)
Calcium: 9 mg/dL (ref 8.9–10.3)
GFR calc Af Amer: 22 mL/min — ABNORMAL LOW (ref >60)
GFR, EST NON AFRICAN AMERICAN: 19 mL/min — AB (ref >60)
Glucose, Bld: 223 mg/dL — ABNORMAL HIGH (ref 70–99)
POTASSIUM: 4.2 mmol/L (ref 3.5–5.1)
SODIUM: 146 mmol/L — AB (ref 135–145)
Total Bilirubin: 1.2 mg/dL (ref 0.3–1.2)
Total Protein: 5.9 g/dL — ABNORMAL LOW (ref 6.5–8.1)

## 2018-05-21 LAB — CBC WITH DIFFERENTIAL/PLATELET
BASOS ABS: 0 10*3/uL (ref 0.0–0.1)
Band Neutrophils: 13 %
Basophils Relative: 0 %
EOS ABS: 0 10*3/uL (ref 0.0–0.5)
EOS PCT: 0 %
HEMATOCRIT: 49.2 % (ref 39.0–52.0)
HEMOGLOBIN: 14.9 g/dL (ref 13.0–17.0)
LYMPHS ABS: 0.2 10*3/uL — AB (ref 0.7–4.0)
Lymphocytes Relative: 1 %
MCH: 32.5 pg (ref 26.0–34.0)
MCHC: 30.3 g/dL (ref 30.0–36.0)
MCV: 107.4 fL — ABNORMAL HIGH (ref 80.0–100.0)
MONOS PCT: 0 %
Monocytes Absolute: 0 10*3/uL — ABNORMAL LOW (ref 0.1–1.0)
NEUTROS ABS: 20.1 10*3/uL — AB (ref 1.7–7.7)
NEUTROS PCT: 86 %
Platelets: 198 10*3/uL (ref 150–400)
RBC: 4.58 MIL/uL (ref 4.22–5.81)
RDW: 14.2 % (ref 11.5–15.5)
WBC Morphology: INCREASED
WBC: 20.3 10*3/uL — AB (ref 4.0–10.5)
nRBC: 0 % (ref 0.0–0.2)
nRBC: 0 /100 WBC

## 2018-05-21 LAB — TYPE AND SCREEN
ABO/RH(D): O POS
Antibody Screen: NEGATIVE

## 2018-05-21 LAB — I-STAT ARTERIAL BLOOD GAS, ED
Acid-base deficit: 13 mmol/L — ABNORMAL HIGH (ref 0.0–2.0)
Bicarbonate: 13.2 mmol/L — ABNORMAL LOW (ref 20.0–28.0)
O2 Saturation: 92 %
PH ART: 7.225 — AB (ref 7.350–7.450)
PO2 ART: 76 mmHg — AB (ref 83.0–108.0)
Patient temperature: 98.6
TCO2: 14 mmol/L — ABNORMAL LOW (ref 22–32)
pCO2 arterial: 31.9 mmHg — ABNORMAL LOW (ref 32.0–48.0)

## 2018-05-21 LAB — URINALYSIS, ROUTINE W REFLEX MICROSCOPIC
Bacteria, UA: NONE SEEN
Bilirubin Urine: NEGATIVE
Glucose, UA: NEGATIVE mg/dL
Ketones, ur: NEGATIVE mg/dL
NITRITE: NEGATIVE
PROTEIN: 100 mg/dL — AB
SPECIFIC GRAVITY, URINE: 1.024 (ref 1.005–1.030)
WBC, UA: >50 WBC/hpf — ABNORMAL HIGH (ref 0–5)
pH: 5 (ref 5.0–8.0)

## 2018-05-21 LAB — LACTIC ACID, PLASMA
LACTIC ACID, VENOUS: 2.9 mmol/L — AB (ref 0.5–1.9)
Lactic Acid, Venous: 2.9 mmol/L (ref 0.5–1.9)

## 2018-05-21 LAB — I-STAT CHEM 8, ED
BUN: 42 mg/dL — AB (ref 8–23)
CALCIUM ION: 1.07 mmol/L — AB (ref 1.15–1.40)
Chloride: 113 mmol/L — ABNORMAL HIGH (ref 98–111)
Creatinine, Ser: 2.6 mg/dL — ABNORMAL HIGH (ref 0.61–1.24)
Glucose, Bld: 218 mg/dL — ABNORMAL HIGH (ref 70–99)
HEMATOCRIT: 43 % (ref 39.0–52.0)
HEMOGLOBIN: 14.6 g/dL (ref 13.0–17.0)
Potassium: 4.1 mmol/L (ref 3.5–5.1)
Sodium: 144 mmol/L (ref 135–145)
TCO2: 17 mmol/L — ABNORMAL LOW (ref 22–32)

## 2018-05-21 LAB — I-STAT TROPONIN, ED: TROPONIN I, POC: 0.3 ng/mL — AB (ref 0.00–0.08)

## 2018-05-21 LAB — PROTIME-INR
INR: 1.4
PROTHROMBIN TIME: 17 s — AB (ref 11.4–15.2)

## 2018-05-21 LAB — I-STAT CG4 LACTIC ACID, ED: LACTIC ACID, VENOUS: 13.14 mmol/L — AB (ref 0.5–1.9)

## 2018-05-21 LAB — GLUCOSE, CAPILLARY
GLUCOSE-CAPILLARY: 102 mg/dL — AB (ref 70–99)
GLUCOSE-CAPILLARY: 157 mg/dL — AB (ref 70–99)
Glucose-Capillary: 166 mg/dL — ABNORMAL HIGH (ref 70–99)

## 2018-05-21 LAB — MAGNESIUM: Magnesium: 1.6 mg/dL — ABNORMAL LOW (ref 1.7–2.4)

## 2018-05-21 LAB — BRAIN NATRIURETIC PEPTIDE: B NATRIURETIC PEPTIDE 5: 982.8 pg/mL — AB (ref 0.0–100.0)

## 2018-05-21 LAB — HEMOGLOBIN AND HEMATOCRIT, BLOOD
HEMATOCRIT: 35.4 % — AB (ref 39.0–52.0)
HEMOGLOBIN: 11.1 g/dL — AB (ref 13.0–17.0)

## 2018-05-21 LAB — MRSA PCR SCREENING: MRSA by PCR: NEGATIVE

## 2018-05-21 MED ORDER — SODIUM CHLORIDE 0.9 % IV SOLN
8.0000 mg/h | INTRAVENOUS | Status: DC
  Administered 2018-05-21 – 2018-05-22 (×2): 8 mg/h via INTRAVENOUS
  Filled 2018-05-21 (×3): qty 80

## 2018-05-21 MED ORDER — FENTANYL CITRATE (PF) 100 MCG/2ML IJ SOLN
100.0000 ug | Freq: Once | INTRAMUSCULAR | Status: DC
  Filled 2018-05-21: qty 2

## 2018-05-21 MED ORDER — NOREPINEPHRINE 4 MG/250ML-% IV SOLN
INTRAVENOUS | Status: AC
  Filled 2018-05-21: qty 250

## 2018-05-21 MED ORDER — MIDAZOLAM HCL 5 MG/5ML IJ SOLN: 5.0000 mg | Freq: Once | INTRAMUSCULAR | Status: DC

## 2018-05-21 MED ORDER — SODIUM CHLORIDE 0.9 % IV SOLN
0.5000 mg/h | INTRAVENOUS | Status: DC
  Administered 2018-05-21: 0.5 mg/h via INTRAVENOUS
  Filled 2018-05-21: qty 10

## 2018-05-21 MED ORDER — FENTANYL 2500MCG IN NS 250ML (10MCG/ML) PREMIX INFUSION: 0.0000 ug/h | INTRAVENOUS | Status: DC

## 2018-05-21 MED ORDER — SODIUM CHLORIDE 0.9 % IV BOLUS
1000.0000 mL | Freq: Once | INTRAVENOUS | Status: AC
  Administered 2018-05-21: 1000 mL via INTRAVENOUS

## 2018-05-21 MED ORDER — FENTANYL CITRATE (PF) 100 MCG/2ML IJ SOLN
50.0000 ug | INTRAMUSCULAR | Status: DC | PRN
  Administered 2018-05-21 – 2018-05-25 (×5): 50 ug via INTRAVENOUS
  Filled 2018-05-21 (×5): qty 2

## 2018-05-21 MED ORDER — SUCCINYLCHOLINE CHLORIDE 20 MG/ML IJ SOLN: 120.0000 mg | Freq: Once | INTRAMUSCULAR | Status: DC

## 2018-05-21 MED ORDER — MIDAZOLAM HCL 2 MG/2ML IJ SOLN
1.0000 mg | INTRAMUSCULAR | Status: DC | PRN
  Administered 2018-05-21 – 2018-05-24 (×5): 1 mg via INTRAVENOUS
  Filled 2018-05-21 (×5): qty 2

## 2018-05-21 MED ORDER — SODIUM CHLORIDE 0.9 % IV SOLN
2.0000 g | Freq: Once | INTRAVENOUS | Status: DC
  Administered 2018-05-21: 2 g via INTRAVENOUS
  Filled 2018-05-21: qty 2

## 2018-05-21 MED ORDER — VANCOMYCIN HCL IN DEXTROSE 1-5 GM/200ML-% IV SOLN: 1000.0000 mg | Freq: Once | INTRAVENOUS | Status: DC

## 2018-05-21 MED ORDER — ORAL CARE MOUTH RINSE
15.0000 mL | OROMUCOSAL | Status: DC
  Administered 2018-05-21 – 2018-06-01 (×104): 15 mL via OROMUCOSAL

## 2018-05-21 MED ORDER — SODIUM CHLORIDE 0.9 % IV SOLN: 1.0000 g | INTRAVENOUS | Status: DC

## 2018-05-21 MED ORDER — SODIUM CHLORIDE 0.9 % IV SOLN
INTRAVENOUS | Status: DC
  Administered 2018-05-22 (×2): via INTRAVENOUS
  Administered 2018-05-23: 1000 mL via INTRAVENOUS
  Administered 2018-05-29 – 2018-05-31 (×2): via INTRAVENOUS

## 2018-05-21 MED ORDER — VANCOMYCIN HCL IN DEXTROSE 750-5 MG/150ML-% IV SOLN: 750.0000 mg | INTRAVENOUS | Status: DC

## 2018-05-21 MED ORDER — CHLORHEXIDINE GLUCONATE 0.12% ORAL RINSE (MEDLINE KIT)
15.0000 mL | Freq: Two times a day (BID) | OROMUCOSAL | Status: DC
  Administered 2018-05-21 – 2018-06-01 (×21): 15 mL via OROMUCOSAL

## 2018-05-21 MED ORDER — ETOMIDATE 2 MG/ML IV SOLN: 0.3000 mg/kg | Freq: Once | INTRAVENOUS | Status: DC

## 2018-05-21 MED ORDER — ETOMIDATE 2 MG/ML IV SOLN
INTRAVENOUS | Status: AC | PRN
  Administered 2018-05-21: 20 mg via INTRAVENOUS

## 2018-05-21 MED ORDER — SODIUM CHLORIDE 0.9 % IV SOLN
80.0000 mg | Freq: Once | INTRAVENOUS | Status: AC
  Administered 2018-05-21: 80 mg via INTRAVENOUS
  Filled 2018-05-21: qty 80

## 2018-05-21 MED ORDER — METRONIDAZOLE IN NACL 5-0.79 MG/ML-% IV SOLN
500.0000 mg | Freq: Three times a day (TID) | INTRAVENOUS | Status: DC
  Filled 2018-05-21: qty 100

## 2018-05-21 MED ORDER — MAGNESIUM SULFATE 2 GM/50ML IV SOLN
2.0000 g | Freq: Once | INTRAVENOUS | Status: AC
  Administered 2018-05-21: 2 g via INTRAVENOUS
  Filled 2018-05-21: qty 50

## 2018-05-21 MED ORDER — INSULIN ASPART 100 UNIT/ML ~~LOC~~ SOLN
1.0000 [IU] | SUBCUTANEOUS | Status: DC
  Administered 2018-05-21 (×2): 2 [IU] via SUBCUTANEOUS
  Administered 2018-05-22 – 2018-05-25 (×5): 1 [IU] via SUBCUTANEOUS
  Administered 2018-05-25 – 2018-05-26 (×3): 2 [IU] via SUBCUTANEOUS
  Administered 2018-05-26: 1 [IU] via SUBCUTANEOUS
  Administered 2018-05-26: 2 [IU] via SUBCUTANEOUS
  Administered 2018-05-26 – 2018-05-27 (×4): 1 [IU] via SUBCUTANEOUS
  Administered 2018-05-27: 2 [IU] via SUBCUTANEOUS
  Administered 2018-05-27 – 2018-05-29 (×2): 1 [IU] via SUBCUTANEOUS
  Administered 2018-05-29: 2 [IU] via SUBCUTANEOUS
  Administered 2018-05-29: 1 [IU] via SUBCUTANEOUS
  Administered 2018-05-30 – 2018-05-31 (×6): 2 [IU] via SUBCUTANEOUS
  Administered 2018-05-31: 1 [IU] via SUBCUTANEOUS
  Administered 2018-05-31: 2 [IU] via SUBCUTANEOUS
  Administered 2018-05-31: 1 [IU] via SUBCUTANEOUS
  Administered 2018-05-31: 3 [IU] via SUBCUTANEOUS
  Administered 2018-06-01: 1 [IU] via SUBCUTANEOUS
  Administered 2018-06-01: 2 [IU] via SUBCUTANEOUS
  Administered 2018-06-01: 1 [IU] via SUBCUTANEOUS

## 2018-05-21 MED ORDER — PIPERACILLIN-TAZOBACTAM IN DEX 2-0.25 GM/50ML IV SOLN
2.2500 g | Freq: Three times a day (TID) | INTRAVENOUS | Status: DC
  Administered 2018-05-21 – 2018-05-22 (×2): 2.25 g via INTRAVENOUS
  Filled 2018-05-21 (×3): qty 50

## 2018-05-21 MED ORDER — MIDAZOLAM HCL 2 MG/2ML IJ SOLN
5.0000 mg | Freq: Once | INTRAMUSCULAR | Status: AC
  Administered 2018-05-21: 5 mg via INTRAVENOUS
  Filled 2018-05-21: qty 6

## 2018-05-21 MED ORDER — FENTANYL CITRATE (PF) 100 MCG/2ML IJ SOLN
INTRAMUSCULAR | Status: AC | PRN
  Administered 2018-05-21: 100 ug via INTRAVENOUS

## 2018-05-21 MED ORDER — SUCCINYLCHOLINE CHLORIDE 20 MG/ML IJ SOLN
INTRAMUSCULAR | Status: AC | PRN
  Administered 2018-05-21: 120 mg via INTRAVENOUS

## 2018-05-21 MED ORDER — PANTOPRAZOLE SODIUM 40 MG IV SOLR: 40.0000 mg | Freq: Two times a day (BID) | INTRAVENOUS | Status: DC

## 2018-05-21 MED ORDER — NOREPINEPHRINE 4 MG/250ML-% IV SOLN
0.0000 ug/min | INTRAVENOUS | Status: DC
  Administered 2018-05-21: 10 ug/min via INTRAVENOUS
  Administered 2018-05-22 (×2): 9 ug/min via INTRAVENOUS
  Administered 2018-05-22 – 2018-05-23 (×2): 5 ug/min via INTRAVENOUS
  Filled 2018-05-21 (×4): qty 250

## 2018-05-21 NOTE — ED Provider Notes (Signed)
[facility] EMERGENCY DEPARTMENT Provider Note   CSN: [id number] Arrival date & time: 05/21/18  1250     History   Chief Complaint Chief Complaint  Patient presents with  . Respiratory Distress    HPI Charles Hatfield is a 77 y.o. male.  HPI Patient is brought from home.  Complaint is for vomiting brownish-black material.  Patient arrives in extremis and family is not here yet.  Limited additional history.  EMS reports that at home, there was some appearance that the patient might be excessively bedbound her chronically ill with his bed set up with multiple medications around it and chux.  We are waiting family for additional history.  Patient was in respiratory distress.  Hypoxic on arrival.  They report patient was obtunded and poorly responsive.  Family had informed EMS that patient had self discontinued all of his medications for about the past 2 months.  He used to be on Coumadin but reportedly stopped it.  I will need family here ultimately to give additional history. Past Medical History:  Diagnosis Date  . Atrial fibrillation (HCC)    perioperative  . CAD (coronary artery disease)   . Carotid stenosis    bilateral  . COPD (chronic obstructive pulmonary disease) (HCC)   . Critical lower limb ischemia   . Diabetes mellitus   . DM (diabetes mellitus) (HCC) 01/15/2012  . Dyslipidemia   . H/O ascending aorta repair   . Hematuria 01/15/2012  . Hypertension   . Ischemic cardiomyopathy   . Left ventricular apical thrombus following MI, NSTEMI, 01/13/12 01/15/2012  . LV (left ventricular) mural thrombus    inferapical  . S/P CABG x 3 01/22/2012   LIMA to LAD,SVG to second diagonal,SVG to distal RCA  . Staghorn renal calculus 01/15/2012  . Stroke (HCC)   . Thrombocytopenia [facility])     Patient Active Problem List   Diagnosis Date Noted  . Atrial fibrillation (HCC)   . Chronic systolic congestive heart failure (HCC)   . Vascular dementia without behavioral  disturbance (HCC)   . Embolic cerebral infarction (HCC) 06/28/2017  . Hemiparesis affecting right side as late effect of cerebrovascular accident (CVA) (HCC)   . Residual cognitive deficit as late effect of stroke   . Dysphagia as late effect of stroke   . Chronic combined systolic and diastolic CHF (congestive heart failure) (HCC)   . Benign essential HTN   . PAF (paroxysmal atrial fibrillation) (HCC)   . Intracardiac thrombosis   . Tachypnea   . Right hemiparesis (HCC) 06/25/2017  . Dysarthria 06/25/2017  . Dysphagia 06/25/2017  . CVA (cerebral vascular accident) (HCC) 06/25/2017  . Long term (current) use of anticoagulants 06/25/2017  . Mixed hyperlipidemia 06/15/2013  . CKD (chronic kidney disease) stage 3, GFR 30-59 ml/min (HCC) 03/02/2013  . Critical lower limb ischemia 02/10/2013  . Preop cardiovascular exam 12/18/2012  . Occlusion and stenosis of carotid artery without mention of cerebral infarction 02/13/2012  . S/P CABG x 3     01/23/12 01/24/2012  . S/P carotid endarterectomy   Lt, 01/23/12 01/24/2012  . Carotid arterial disease, severe LICA stenosis 01/18/2012  . History of stroke 01/18/2012  . CAD (coronary artery disease) status post CABG July 2013 01/17/2012  . COPD (chronic obstructive pulmonary disease) (HCC) 01/17/2012  . Acute renal insufficiency, SCr up to 1.43 01/17/2012  . Ischemic cardiomyopathy, EF 40-45% 2D 01/15/12 01/17/2012  . Staghorn renal calculus 01/15/2012  . DM (diabetes mellitus) (HCC) 01/15/2012  .  NSTEMI (non-ST elevated myocardial infarction) (HCC) 01/14/2012  . HTN (hypertension) 01/14/2012  . Thrombocytopenia (HCC) 01/14/2012  . Tobacco abuse 01/14/2012  . H/O ascending aorta repair     Past Surgical History:  Procedure Laterality Date  . ABDOMINAL ANGIOGRAM  03/03/2013   Procedure: ABDOMINAL ANGIOGRAM;  Surgeon: Runell Gess, MD;  Location: [facility] CATH LAB;  Service: Cardiovascular;;  . CAROTID ENDARTERECTOMY  01/13/2012  . CORONARY  ANGIOGRAM  01/16/2012   Procedure: CORONARY ANGIOGRAM;  Surgeon: Thurmon Fair, MD;  Location: [facility] CATH LAB;  Service: Cardiovascular;;  . CORONARY ARTERY BYPASS GRAFT  01/22/2012   Procedure: CORONARY ARTERY BYPASS GRAFTING (CABG);  Surgeon: Kerin Perna, MD;  Location: [facility] OR;  Service: Open Heart Surgery;  Laterality: N/A;  CABG x three using left internal mammary artery and right leg greater saphenous vein harvested endoscopically  . ENDARTERECTOMY  01/22/2012   Procedure: ENDARTERECTOMY CAROTID;  Surgeon: Larina Earthly, MD;  Location: [facility] OR;  Service: Vascular;  Laterality: Left;  . LOWER EXTREMITY ANGIOGRAM Left 03/03/2013   Procedure: LOWER EXTREMITY ANGIOGRAM;  Surgeon: Runell Gess, MD;  Location: [facility] CATH LAB;  Service: Cardiovascular;  Laterality: Left;  . NM MYOVIEW LTD  02/20/2012   infarct/scar w/mild perinfarct ischemia, EF 38%, abnormal nuc, no sign. ischemia  . REPAIR THORACIC AORTA     Ascending thoracic aorto repair        Home Medications    Prior to Admission medications   Medication Sig Start Date End Date Taking? Authorizing Provider  acetaminophen (TYLENOL) 500 MG tablet Take 1,000 mg by mouth every 6 (six) hours as needed for pain.     [provider]  albuterol (PROVENTIL HFA;VENTOLIN HFA) 108 (90 BASE) MCG/ACT inhaler Inhale 2 puffs into the lungs every 6 (six) hours as needed for wheezing. Patient not taking: Reported on 06/11/2017 06/25/13   Runell Gess, MD  aspirin EC 81 MG tablet Take 81 mg by mouth daily.    [provider]  atorvastatin (LIPITOR) 80 MG tablet Take 1 tablet (80 mg total) by mouth daily at 6 PM. 07/13/17   Angiulli, Mcarthur Rossetti, PA-C  ipratropium (ATROVENT HFA) 17 MCG/ACT inhaler Inhale 2 puffs into the lungs every 6 (six) hours as needed for wheezing. Patient not taking: Reported on 06/11/2017 07/21/14   Croitoru, Mihai, MD  Maltodextrin-Xanthan Gum (RESOURCE THICKENUP CLEAR) POWD Take 360 g by mouth as needed. 07/13/17    Angiulli, Mcarthur Rossetti, PA-C  nicotine (NICODERM CQ - DOSED IN MG/24 HR) 7 mg/24hr patch 7 mg patch daily 3 weeks and stop 07/13/17   Angiulli, Mcarthur Rossetti, PA-C  nitroGLYCERIN (NITROSTAT) 0.4 MG SL tablet DISSOLVE ONE TABLET UNDER THE TONGUE EVERY 5 MINUTES AS NEEDED FOR CHEST PAIN.  DO NOT EXCEED A TOTAL OF 3 DOSES IN 15 MINUTES NOW 07/13/17   Angiulli, Mcarthur Rossetti, PA-C  oxybutynin (DITROPAN) 5 MG tablet Take 1 tablet (5 mg total) by mouth at bedtime. 07/13/17   Angiulli, Mcarthur Rossetti, PA-C  polyethylene glycol (MIRALAX / GLYCOLAX) packet Take 17 g by mouth daily. 07/13/17   Angiulli, Mcarthur Rossetti, PA-C  warfarin (COUMADIN) 2.5 MG tablet Take 1 tablet (2.5 mg total) by mouth daily. 07/13/17 07/13/18  Angiulli, Mcarthur Rossetti, PA-C  warfarin (COUMADIN) 5 MG tablet Take 2.5 mg daily except 5 mg on Tuesdays and Saturdays 07/13/17   Angiulli, Mcarthur Rossetti, PA-C    Family History Family History  Problem Relation Age of Onset  . Heart disease Father   .  Heart attack Brother     Social History Social History   Tobacco Use  . Smoking status: Former Smoker    Packs/day: 1.00    Types: Cigarettes    Last attempt to quit: 01/13/2012    Years since quitting: 6.3  . Smokeless tobacco: Never Used  Substance Use Topics  . Alcohol use: No  . Drug use: No     Allergies   Patient has no known allergies.   Review of Systems Review of Systems Level 5 caveat cannot obtain review of systems due to patient extremitas.  Physical Exam Updated Vital Signs BP (!) 115/52   Pulse (!) 121   Resp 17   Ht 5\' 7"  (1.702 m)   SpO2 100%   BMI 20.03 kg/m   Physical Exam  Constitutional:  Patient's respirations are labored.  Mental status is obtunded.  Eyes are slightly open at half mast but does not appear to be alert to situation.  Patient has dry brown material all over his face and clothes.  Patient is thin and deconditioned in appearance.  HENT:  Mouth is very dry with deep brown staining of emesis on his tongue and mucosa.    Eyes:  Patient's pupils are about 3 mm and minimally responsive to light.  Patient is not having purposeful eye motions.  Cardiovascular:  Tachycardia.  No appreciable rub murmur gallop.  Pulmonary/Chest:  Breath sounds are very labored.  Extensively decreased breath sounds on the right.  Airflow positive on the left.  Abdominal: Soft.  Patient's abdomen is soft and nondistended.  Musculoskeletal:  Extremities are very thin.  No apparent deformities.  No significant peripheral edema.  Neurological:  Obtunded with no purposeful activities.  No pain response.  Skin: Skin is warm and dry. There is pallor.     ED Treatments / Results  Labs (all labs ordered are listed, but only abnormal results are displayed) Labs Reviewed  CBC WITH DIFFERENTIAL/PLATELET - Abnormal; Notable for the following components:      Result Value   MCV 107.4 (*)    All other components within normal limits  I-STAT CHEM 8, ED - Abnormal; Notable for the following components:   Chloride 113 (*)    BUN 42 (*)    Creatinine, Ser 2.60 (*)    Glucose, Bld 218 (*)    Calcium, Ion 1.07 (*)    TCO2 17 (*)    All other components within normal limits  I-STAT CG4 LACTIC ACID, ED - Abnormal; Notable for the following components:   Lactic Acid, Venous 13.14 (*)    All other components within normal limits  I-STAT TROPONIN, ED - Abnormal; Notable for the following components:   Troponin i, poc 0.30 (*)    All other components within normal limits  CULTURE, BLOOD (ROUTINE X 2)  CULTURE, BLOOD (ROUTINE X 2)  COMPREHENSIVE METABOLIC PANEL  LIPASE, BLOOD  BRAIN NATRIURETIC PEPTIDE  PROTIME-INR  URINALYSIS, ROUTINE W REFLEX MICROSCOPIC  BLOOD GAS, ARTERIAL  TYPE AND SCREEN    EKG EKG Interpretation  Date/Time:  Tuesday May 21 2018 13:28:25 EST Ventricular Rate:  125 PR Interval:    QRS Duration: 88 QT Interval:  380 QTC Calculation: 548 R Axis:   80 Text Interpretation:  Sinus tachycardia  Ventricular premature complex Borderline repolarization abnormality Prolonged QT interval Since last EKG, rate is increased with non-specific ST-t changes, worse laterally Confirmed by Shaune Pollack [phone]) on 05/22/2018 3:33:58 PM   Radiology No results found.  Procedures Procedure  Name: Intubation Date/Time: 05/21/2018 1:41 PM Performed by: Arby Barrette, MD Pre-anesthesia Checklist: Patient identified, Patient being monitored, Emergency Drugs available, Timeout performed and Suction available Oxygen Delivery Method: Non-rebreather mask Preoxygenation: Pre-oxygenation with 100% oxygen Induction Type: Rapid sequence Ventilation: Mask ventilation without difficulty Laryngoscope Size: 3 and Glidescope Grade View: Grade I Tube size: 7.5 mm Number of attempts: 1 Placement Confirmation: ETT inserted through vocal cords under direct vision,  CO2 detector and Breath sounds checked- equal and bilateral Comments: Post intubation x-ray examined by myself.  Both lungs are inflated.  No gross consolidations or large pleural effusions.  ET tube position between the clavicles.  OG tube extends down below the level of the diaphragm.      (including critical care time) CRITICAL CARE Performed by: Arby Barrette   Total critical care time: 60 minutes  Critical care time was exclusive of separately billable procedures and treating other patients.  Critical care was necessary to treat or prevent imminent or life-threatening deterioration.  Critical care was time spent personally by me on the following activities: development of treatment plan with patient and/or surrogate as well as nursing, discussions with consultants, evaluation of patient's response to treatment, examination of patient, obtaining history from patient or surrogate, ordering and performing treatments and interventions, ordering and review of laboratory studies, ordering and review of radiographic studies, pulse oximetry and  re-evaluation of patient's condition. Medications Ordered in ED Medications  fentaNYL (SUBLIMAZE) injection 100 mcg ( Intravenous Canceled Entry 05/21/18 1307)  succinylcholine (ANECTINE) injection 120 mg ( Intravenous Canceled Entry 05/21/18 1307)  etomidate (AMIDATE) injection 0.3 mg/kg ( Intravenous Canceled Entry 05/21/18 1307)  etomidate (AMIDATE) injection (20 mg Intravenous Given 05/21/18 1305)  fentaNYL (SUBLIMAZE) injection (100 mcg Intravenous Given 05/21/18 1306)  succinylcholine (ANECTINE) injection (120 mg Intravenous Given 05/21/18 1306)     Initial Impression / Assessment and Plan / ED Course  I have reviewed the triage vital signs and the nursing notes.  Pertinent labs & imaging results that were available during my care of the patient were reviewed by me and considered in my medical decision making (see chart for details).  Clinical Course as of May 23 1421  Tue May 21, 2018  1446 Patient arrived in extremis.  Initial blood pressures were in normotensive range.  Post intubation, patient does have hypotension.  He is infusing now at 3 L of normal saline but map is in the 40s.  Will initiate Levophed.  Unknown source.  Patient is having a dark-colored emesis.  Goal is for patient to go to CT for additional diagnostic information to R/o surgical intrabdominal pathology   [MP]  1452 Consult:reviewed with intensivist for admission   [MP]    Clinical Course User Index [MP] Arby Barrette, MD  Patient arrives via EMS in extremis.  Very limited additional history except that patient had self discontinued all medications.  He had appearance of cachexia and severe respiratory distress.  Patient was covered and dry brown emesis with appearance of possible upper GI bleed versus obstruction.  Full resuscitation initiated with fluid resuscitation and empiric antibiotics for sepsis.  Intensivist consulted for admission and definitive management.   Final Clinical Impressions(s) / ED  Diagnoses   Final diagnoses:  Acute respiratory failure with hypoxia [facility])    ED Discharge Orders    None       Arby Barrette, MD 05/23/18 1426

## 2018-05-21 NOTE — H&P (Signed)
NAME:  EDWEN MCLESTER, MRN:  [id number], DOB:  1940-08-28, LOS: 0 ADMISSION DATE:  05/21/2018, CONSULTATION DATE:  05/21/2018 REFERRING MD:  Dr. Donnald Garre, ER, CHIEF COMPLAINT:  Spewing black material from his orifice   Brief History   77 yo male brought to ER with altered mental status, vomiting black material, hypotension, hypoxia.  Intubated in ER.  Concern for aspiration pneumonia and upper GI bleeding.  History of present illness   Hx from chart and pt's son.  He was living in a hotel until a few weeks ago.  Moved into a house with his son.  Had a stroke in 1990's and again about 1 year ago.  Able to recover most functions with rehab.  For the past week his appetite has been poor.  Hasn't had bowel movement.  This morning his son found him somnolent, spewing black material from his mouth, and breathing heavy.  He was hypotension, and hypoxia in ER.  Intubated, started on ABx, given IV fluid, and started on pressors.    He was previously on warfarin for clot in his heart, but patient stopped this several months ago with the hope that he could get his teeth fixed.  He takes aspirin daily.  Son not aware of any other NSAID use or alcohol abuse.  Son not aware of him ever seeing GI before.  Past Medical History  CVA, Nephrolithiasis, CAD s/p CABG, LV mural thrombus, ischemic CM, HTN, Ascending aortic aneurysm, HLD, DM, COPD, A fib  Significant Hospital Events   11/26 admit  Consults:    Procedures:  ETT 11/26 >>   Significant Diagnostic Tests:  CT abd/pelvis 11/26 >> Echo 11/26 >>  Micro Data:  Blood 11/26 >> Urine 11/26 >> Sputum 11/26 >>  Antimicrobials:  Zosyn 11/26 >>   Interim history/subjective:    Objective   Blood pressure 127/61, pulse 68, temperature 97.9 F (36.6 C), resp. rate 16, height 5\' 7"  (1.702 m), SpO2 100 %.    Vent Mode: PRVC FiO2 (%):  [100 %] 100 % Set Rate:  [14 bmp] 14 bmp Vt Set:  [520 mL] 520 mL PEEP:  [5 cmH20] 5 cmH20 Plateau  Pressure:  [19 cmH20] 19 cmH20   Intake/Output Summary (Last 24 hours) at 05/21/2018 1530 Last data filed at 05/21/2018 1530 Gross per 24 hour  Intake 1000 ml  Output 25 ml  Net 975 ml   There were no vitals filed for this visit.  Examination:  General - sedated Eyes - pupils reactive ENT - ETT in place, black material around his mouth Cardiac - regular rate/rhythm, no murmur Chest - decreased BS, b/l rhonchi Abdomen - soft, non tender, decreased bowel sounds GU - no lesions noted Extremities - decreased muscle bulk Skin - no rashes Neuro - RASS -4  CXR 11/26 (reviewed by me) >> RLL ASD     Resolved Hospital Problem list     Assessment & Plan:   Acute respiratory failure with hypoxia. Plan - full vent support - f/u CXR, ABG  Aspiration pneumonia with septic shock. Plan - zosyn - continue IV fluids - pressors to keep MAP > 65  Upper GI bleeding. Plan - f/ Hb - f/u CT abd/pelvis - protonix gtt - might need GI assessment  Metabolic acidosis. Acute renal failure from ATN. Plan - baseline creatinine 1.15 from 07/06/17 - continue resuscitation  - f/u BMET, lactic acid  Hx of CAD, LV mural thrombus, ischemic CM. Plan - f/u Echo - hold ASA  Acute metabolic encephalopathy. Plan - f/u CT head  DM. Plan  - SSI  Severe protein calorie malnutrition. Plan - NPO  Best practice:  Diet: NPO DVT prophylaxis: SCD GI prophylaxis: protonix gtt Mobility: bed rest Code Status: full code Family Communication: updated pt's son at bedside Disposition: admit to ICU  Labs   CBC: Recent Labs  Lab 05/21/18 1314 05/21/18 1316  WBC  --  20.3*  NEUTROABS  --  20.1*  HGB 14.6 14.9  HCT 43.0 49.2  MCV  --  107.4*  PLT  --  198    Basic Metabolic Panel: Recent Labs  Lab 05/21/18 1314 05/21/18 1316  NA 144 146*  K 4.1 4.2  CL 113* 110  CO2  --  15*  GLUCOSE 218* 223*  BUN 42* 39*  CREATININE 2.60* 2.97*  CALCIUM  --  9.0   GFR: CrCl cannot  be calculated (Unknown ideal weight.). Recent Labs  Lab 05/21/18 1314 05/21/18 1316  WBC  --  20.3*  LATICACIDVEN 13.14*  --     Liver Function Tests: Recent Labs  Lab 05/21/18 1316  AST 39  ALT 17  ALKPHOS 68  BILITOT 1.2  PROT 5.9*  ALBUMIN 2.4*   Recent Labs  Lab 05/21/18 1316  LIPASE 18   No results for input(s): AMMONIA in the last 168 hours.  ABG    Component Value Date/Time   PHART 7.225 (L) 05/21/2018 1411   PCO2ART 31.9 (L) 05/21/2018 1411   PO2ART 76.0 (L) 05/21/2018 1411   HCO3 13.2 (L) 05/21/2018 1411   TCO2 14 (L) 05/21/2018 1411   ACIDBASEDEF 13.0 (H) 05/21/2018 1411   O2SAT 92.0 05/21/2018 1411     Coagulation Profile: Recent Labs  Lab 05/21/18 1316  INR 1.40    Cardiac Enzymes: No results for input(s): CKTOTAL, CKMB, CKMBINDEX, TROPONINI in the last 168 hours.  HbA1C: Hgb A1c MFr Bld  Date/Time Value Ref Range Status  06/26/2017 05:00 AM 4.9 4.8 - 5.6 % Final    Comment:    (NOTE) Pre diabetes:          5.7%-6.4% Diabetes:              >6.4% Glycemic control for   <7.0% adults with diabetes   01/20/2012 05:11 AM 5.3 <5.7 % Final    Comment:    (NOTE)                                                                       According to the ADA Clinical Practice Recommendations for 2011, when HbA1c is used as a screening test:  >=6.5%   Diagnostic of Diabetes Mellitus           (if abnormal result is confirmed) 5.7-6.4%   Increased risk of developing Diabetes Mellitus References:Diagnosis and Classification of Diabetes Mellitus,Diabetes Care,2011,34(Suppl 1):S62-S69 and Standards of Medical Care in         Diabetes - 2011,Diabetes Care,2011,34 (Suppl 1):S11-S61.    CBG: No results for input(s): GLUCAP in the last 168 hours.  Review of Systems:   Unable to obtain  Past Medical History  He,  has a past medical history of Atrial fibrillation (HCC), CAD (coronary artery disease), Carotid stenosis, COPD (chronic obstructive  pulmonary disease) (HCC), Critical lower limb ischemia, Diabetes mellitus, DM (diabetes mellitus) (HCC) (01/15/2012), Dyslipidemia, H/O ascending aorta repair, Hematuria (01/15/2012), Hypertension, Ischemic cardiomyopathy, Left ventricular apical thrombus following MI, NSTEMI, 01/13/12 (01/15/2012), LV (left ventricular) mural thrombus, S/P CABG x 3 (01/22/2012), Staghorn renal calculus (01/15/2012), Stroke (HCC), and Thrombocytopenia (HCC).   Surgical History    Past Surgical History:  Procedure Laterality Date  . ABDOMINAL ANGIOGRAM  03/03/2013   Procedure: ABDOMINAL ANGIOGRAM;  Surgeon: Runell Gess, MD;  Location: [facility] CATH LAB;  Service: Cardiovascular;;  . CAROTID ENDARTERECTOMY  01/13/2012  . CORONARY ANGIOGRAM  01/16/2012   Procedure: CORONARY ANGIOGRAM;  Surgeon: Thurmon Fair, MD;  Location: [facility] CATH LAB;  Service: Cardiovascular;;  . CORONARY ARTERY BYPASS GRAFT  01/22/2012   Procedure: CORONARY ARTERY BYPASS GRAFTING (CABG);  Surgeon: Kerin Perna, MD;  Location: [facility] OR;  Service: Open Heart Surgery;  Laterality: N/A;  CABG x three using left internal mammary artery and right leg greater saphenous vein harvested endoscopically  . ENDARTERECTOMY  01/22/2012   Procedure: ENDARTERECTOMY CAROTID;  Surgeon: Larina Earthly, MD;  Location: [facility] OR;  Service: Vascular;  Laterality: Left;  . LOWER EXTREMITY ANGIOGRAM Left 03/03/2013   Procedure: LOWER EXTREMITY ANGIOGRAM;  Surgeon: Runell Gess, MD;  Location: [facility] CATH LAB;  Service: Cardiovascular;  Laterality: Left;  . NM MYOVIEW LTD  02/20/2012   infarct/scar w/mild perinfarct ischemia, EF 38%, abnormal nuc, no sign. ischemia  . REPAIR THORACIC AORTA     Ascending thoracic aorto repair     Social History   reports that he quit smoking about 6 years ago. His smoking use included cigarettes. He smoked 1.00 pack per day. He has never used smokeless tobacco. He reports that he does not drink alcohol or use drugs.   Family History   His family  history includes Heart attack in his brother; Heart disease in his father.   Allergies No Known Allergies   Home Medications  Prior to Admission medications   Medication Sig Start Date End Date Taking? Authorizing Provider  acetaminophen (TYLENOL) 500 MG tablet Take 1,000 mg by mouth every 6 (six) hours as needed for pain.    Yes [provider]  aspirin EC 81 MG tablet Take 81 mg by mouth daily.   Yes [provider]  diphenhydramine-acetaminophen (TYLENOL PM EXTRA STRENGTH) 25-500 MG TABS tablet Take 1-2 tablets by mouth at bedtime as needed (sleep).   Yes [provider]  Maltodextrin-Xanthan Gum (RESOURCE THICKENUP CLEAR) POWD Take 360 g by mouth as needed. 07/13/17  Yes Angiulli, Mcarthur Rossetti, PA-C  nitroGLYCERIN (NITROSTAT) 0.4 MG SL tablet DISSOLVE ONE TABLET UNDER THE TONGUE EVERY 5 MINUTES AS NEEDED FOR CHEST PAIN.  DO NOT EXCEED A TOTAL OF 3 DOSES IN 15 MINUTES NOW 07/13/17  Yes Angiulli, Mcarthur Rossetti, PA-C  nicotine (NICODERM CQ - DOSED IN MG/24 HR) 7 mg/24hr patch 7 mg patch daily 3 weeks and stop 07/13/17   Angiulli, Mcarthur Rossetti, PA-C     Critical care time: 38 minutes     Coralyn Helling, MD [facility] Pulmonary/Critical Care 05/21/2018, 3:45 PM

## 2018-05-21 NOTE — ED Notes (Signed)
Patient transported to CT with RN 

## 2018-05-21 NOTE — Progress Notes (Signed)
RT NOTES: Received patient from ED on PRVC 520/14/+5/100%.

## 2018-05-21 NOTE — ED Notes (Signed)
Upon reassessment of the foley insertion site, a tinge of blood was observed around the meatus area. [place] - RN aware.

## 2018-05-21 NOTE — ED Triage Notes (Signed)
Pt arrives to ED from home with on an non-rebreather mask with their oxygen saturation at 84%. Pt is covered in what looks like to be blood or feces around mouth and chest. Pt is disoriented and in distress upon arrival. EMS reports pt was seen this morning by son actively vomiting brown fluid. Pt's son stated that pt has been progressively weaker over the last week but is normally a&o x4 and able to care for himself. Pt has not been taking any of his home medications for the last month. MD notified and respiratory notified for possible intubation.

## 2018-05-21 NOTE — Procedures (Signed)
Central Venous Catheter Insertion Procedure Note MERYLE PACIFICO [id number] [birth year]  Procedure: Insertion of Central Venous Catheter Indications: Drug and/or fluid administration  Procedure Details Consent: Unable to obtain consent because of emergent medical necessity. Time Out: Verified patient identification, verified procedure, site/side was marked, verified correct patient position, special equipment/implants available, medications/allergies/relevent history reviewed, required imaging and test results available.  Performed  Maximum sterile technique was used including antiseptics, cap, gloves, gown, hand hygiene, mask and sheet. Skin prep: Chlorhexidine; local anesthetic administered A antimicrobial bonded/coated triple lumen catheter was placed in the right internal jugular vein using the Seldinger technique.  Evaluation Blood flow good Complications: No apparent complications Patient did tolerate procedure well. Chest X-ray ordered to verify placement.  CXR: pending   Vessel visualized with real time [place]. Procedure completed under direct supervision of Dr. Nathaneil Canary 05/21/2018, 4:25 PM

## 2018-05-21 NOTE — Progress Notes (Signed)
eLink Physician-Brief Progress Note Patient Name: Charles Hatfield DOB: 1941-01-17 MRN: [id number]   Date of Service  05/21/2018  HPI/Events of Note  Sinus Bradycardia - BP = 119/54. HR 58-64. K+ = 4.2. Occasional PVC. Not on B-Blockers, Calcium Channel Blockers, Digoxin, Propofol or Precedex.   eICU Interventions  Plan: 1. Mg++ level STAT. 2. Continue to observe for now.      Intervention Category Major Interventions: Arrhythmia - evaluation and management  Lateia Fraser Dennard Nip 05/21/2018, 8:37 PM

## 2018-05-21 NOTE — Progress Notes (Signed)
eLink Physician-Brief Progress Note Patient Name: Charles Hatfield DOB: 01-Aug-1940 MRN: [id number]   Date of Service  05/21/2018  HPI/Events of Note  Lactic Acid = 13.14 --> 2.9 --> 2.9. Lactic Acid trending down.   eICU Interventions  Continue present management.      Intervention Category Major Interventions: Acid-Base disturbance - evaluation and management  Sommer,Steven Eugene 05/21/2018, 11:12 PM

## 2018-05-21 NOTE — Progress Notes (Signed)
CRITICAL VALUE ALERT  Critical Value:  Lactic 2.9  Date & Time Notied:  05/21/18 2221  Provider Notified: Pola Corn  Orders Received/Actions taken:

## 2018-05-21 NOTE — Progress Notes (Signed)
eLink Physician-Brief Progress Note Patient Name: Charles Hatfield DOB: 1941-02-24 MRN: [id number]   Date of Service  05/21/2018  HPI/Events of Note  Mg++ = 1.6 and Creatinine = 2.97.  eICU Interventions  Will replace Mg++.      Intervention Category Major Interventions: Electrolyte abnormality - evaluation and management  Patrica Mendell Eugene 05/21/2018, 10:36 PM

## 2018-05-21 NOTE — Progress Notes (Signed)
Patient transported to CT and back to room 30.

## 2018-05-21 NOTE — Progress Notes (Addendum)
Pharmacy Antibiotic Note  Charles Hatfield is a 77 y.o. male admitted on 05/21/2018 with sepsis of unknown source.  Pharmacy has been consulted for vancomycin and cefepime dosing.  Currently, afebrile with elevated WBC 20.3. Patient noted to have CKD III. Scr 2.97 mg/dL (BL ~9.5 mg/dL). Estimated CrCl ~17 mL/min.  Plan: Cefepime 2 gm IV once, then 1 gm IV q24h Vancomycin 1000 mg IV once, then 750 mg IV q48h Metronidazole 500 mg IV q8h per MD Monitor renal function and vancomycin trough at steady-state  ADDEDUM: Admitting team changing antibiotics to Zosyn for aspiration pneumonia. Patient has already received one dose of cefepime while in ED.   Plan:  Zosyn 2.25 gm IV q8h Monitor renal function F/u cultures and LOT   Height: 5\' 7"  (170.2 cm) IBW/kg (Calculated) : 66.1  Temp (24hrs), Avg:98.1 F (36.7 C), Min:96.6 F (35.9 C), Max:98.6 F (37 C)  Recent Labs  Lab 05/21/18 1314 05/21/18 1316  WBC  --  20.3*  CREATININE 2.60* 2.97*  LATICACIDVEN 13.14*  --     CrCl cannot be calculated (Unknown ideal weight.).    No Known Allergies  Antimicrobials this admission: Cefepime 11/26>> Vancomycin 11/26>> Metronidazole 11/26>>  Dose adjustments this admission: N/A  Microbiology results: 11/26 BCx: 11/26 UCx:  Thank you for allowing pharmacy to be a part of this patient's care.  Chauncey Mann, Pharmacy Student

## 2018-05-22 ENCOUNTER — Encounter (HOSPITAL_COMMUNITY)
Admission: EM | Disposition: A | Discharge status: Nursing Home (Includes ICF) | Payer: Self-pay | Source: Home / Self Care | Attending: Pulmonary Disease

## 2018-05-22 ENCOUNTER — Other Ambulatory Visit (HOSPITAL_COMMUNITY): Payer: Federal, State, Local not specified - PPO

## 2018-05-22 ENCOUNTER — Inpatient Hospital Stay (HOSPITAL_COMMUNITY): Payer: Medicare Other

## 2018-05-22 ENCOUNTER — Encounter (HOSPITAL_COMMUNITY): Payer: Self-pay | Admitting: Gastroenterology

## 2018-05-22 DIAGNOSIS — L8990 Pressure ulcer of unspecified site, unspecified stage: Secondary | ICD-10-CM

## 2018-05-22 HISTORY — PX: ESOPHAGOGASTRODUODENOSCOPY: SHX5428

## 2018-05-22 LAB — BLOOD CULTURE ID PANEL (REFLEXED)
ACINETOBACTER BAUMANNII: NOT DETECTED
CANDIDA ALBICANS: NOT DETECTED
CANDIDA PARAPSILOSIS: NOT DETECTED
Candida glabrata: NOT DETECTED
Candida krusei: NOT DETECTED
Candida tropicalis: NOT DETECTED
ENTEROCOCCUS SPECIES: NOT DETECTED
Enterobacter cloacae complex: NOT DETECTED
Enterobacteriaceae species: NOT DETECTED
Escherichia coli: NOT DETECTED
HAEMOPHILUS INFLUENZAE: NOT DETECTED
Klebsiella oxytoca: NOT DETECTED
Klebsiella pneumoniae: NOT DETECTED
LISTERIA MONOCYTOGENES: NOT DETECTED
METHICILLIN RESISTANCE: DETECTED — AB
NEISSERIA MENINGITIDIS: NOT DETECTED
PSEUDOMONAS AERUGINOSA: NOT DETECTED
Proteus species: NOT DETECTED
SERRATIA MARCESCENS: NOT DETECTED
STAPHYLOCOCCUS AUREUS BCID: NOT DETECTED
STREPTOCOCCUS PNEUMONIAE: NOT DETECTED
STREPTOCOCCUS PYOGENES: NOT DETECTED
STREPTOCOCCUS SPECIES: NOT DETECTED
Staphylococcus species: DETECTED — AB
Streptococcus agalactiae: NOT DETECTED

## 2018-05-22 LAB — GLUCOSE, CAPILLARY
GLUCOSE-CAPILLARY: 119 mg/dL — AB (ref 70–99)
GLUCOSE-CAPILLARY: 99 mg/dL (ref 70–99)
Glucose-Capillary: 127 mg/dL — ABNORMAL HIGH (ref 70–99)
Glucose-Capillary: 136 mg/dL — ABNORMAL HIGH (ref 70–99)
Glucose-Capillary: 92 mg/dL (ref 70–99)
Glucose-Capillary: 86 mg/dL (ref 70–99)

## 2018-05-22 LAB — MAGNESIUM: Magnesium: 2.3 mg/dL (ref 1.7–2.4)

## 2018-05-22 LAB — CBC
HEMATOCRIT: 32.7 % — AB (ref 39.0–52.0)
Hemoglobin: 10.2 g/dL — ABNORMAL LOW (ref 13.0–17.0)
MCH: 32.6 pg (ref 26.0–34.0)
MCHC: 31.2 g/dL (ref 30.0–36.0)
MCV: 104.5 fL — ABNORMAL HIGH (ref 80.0–100.0)
NRBC: 0 % (ref 0.0–0.2)
PLATELETS: 142 10*3/uL — AB (ref 150–400)
RBC: 3.13 MIL/uL — ABNORMAL LOW (ref 4.22–5.81)
RDW: 14.3 % (ref 11.5–15.5)
WBC: 14.5 10*3/uL — AB (ref 4.0–10.5)

## 2018-05-22 LAB — POCT I-STAT 3, ART BLOOD GAS (G3+)
Acid-base deficit: 9 mmol/L — ABNORMAL HIGH (ref 0.0–2.0)
BICARBONATE: 14.6 mmol/L — AB (ref 20.0–28.0)
O2 SAT: 99 %
Patient temperature: 37.6
TCO2: 15 mmol/L — AB (ref 22–32)
pCO2 arterial: 24.7 mmHg — ABNORMAL LOW (ref 32.0–48.0)
pH, Arterial: 7.383 (ref 7.350–7.450)
pO2, Arterial: 133 mmHg — ABNORMAL HIGH (ref 83.0–108.0)

## 2018-05-22 LAB — COMPREHENSIVE METABOLIC PANEL
ALT: 10 U/L (ref 0–44)
AST: 20 U/L (ref 15–41)
Albumin: 1.6 g/dL — ABNORMAL LOW (ref 3.5–5.0)
Alkaline Phosphatase: 39 U/L (ref 38–126)
Anion gap: 6 (ref 5–15)
BILIRUBIN TOTAL: 0.6 mg/dL (ref 0.3–1.2)
BUN: 35 mg/dL — ABNORMAL HIGH (ref 8–23)
CALCIUM: 7.3 mg/dL — AB (ref 8.9–10.3)
CHLORIDE: 122 mmol/L — AB (ref 98–111)
CO2: 17 mmol/L — ABNORMAL LOW (ref 22–32)
Creatinine, Ser: 1.69 mg/dL — ABNORMAL HIGH (ref 0.61–1.24)
GFR, EST AFRICAN AMERICAN: 44 mL/min — AB (ref >60)
GFR, EST NON AFRICAN AMERICAN: 38 mL/min — AB (ref >60)
Glucose, Bld: 149 mg/dL — ABNORMAL HIGH (ref 70–99)
Potassium: 3.2 mmol/L — ABNORMAL LOW (ref 3.5–5.1)
Sodium: 145 mmol/L (ref 135–145)
TOTAL PROTEIN: 4 g/dL — AB (ref 6.5–8.1)

## 2018-05-22 LAB — PHOSPHORUS: Phosphorus: 1.7 mg/dL — ABNORMAL LOW (ref 2.5–4.6)

## 2018-05-22 LAB — LACTIC ACID, PLASMA: Lactic Acid, Venous: 1.6 mmol/L (ref 0.5–1.9)

## 2018-05-22 SURGERY — EGD (ESOPHAGOGASTRODUODENOSCOPY)
Anesthesia: Moderate Sedation

## 2018-05-22 MED ORDER — FENTANYL CITRATE (PF) 100 MCG/2ML IJ SOLN
INTRAMUSCULAR | Status: AC
  Filled 2018-05-22: qty 2

## 2018-05-22 MED ORDER — MIDAZOLAM HCL (PF) 10 MG/2ML IJ SOLN
INTRAMUSCULAR | Status: DC | PRN
  Administered 2018-05-22: 1 mg via INTRAVENOUS

## 2018-05-22 MED ORDER — MIDAZOLAM HCL (PF) 5 MG/ML IJ SOLN
INTRAMUSCULAR | Status: AC
  Filled 2018-05-22: qty 2

## 2018-05-22 MED ORDER — VANCOMYCIN HCL IN DEXTROSE 1-5 GM/200ML-% IV SOLN
1000.0000 mg | Freq: Once | INTRAVENOUS | Status: AC
  Administered 2018-05-22: 1000 mg via INTRAVENOUS
  Filled 2018-05-22: qty 200

## 2018-05-22 MED ORDER — PANTOPRAZOLE SODIUM 40 MG IV SOLR
40.0000 mg | Freq: Two times a day (BID) | INTRAVENOUS | Status: DC
  Administered 2018-05-22 – 2018-05-23 (×4): 40 mg via INTRAVENOUS
  Filled 2018-05-22 (×5): qty 40

## 2018-05-22 MED ORDER — SODIUM CHLORIDE 0.9 % IV SOLN
1.5000 g | Freq: Two times a day (BID) | INTRAVENOUS | Status: DC
  Administered 2018-05-22 – 2018-05-24 (×4): 1.5 g via INTRAVENOUS
  Filled 2018-05-22 (×4): qty 1.5

## 2018-05-22 MED ORDER — VANCOMYCIN HCL IN DEXTROSE 750-5 MG/150ML-% IV SOLN: 750.0000 mg | INTRAVENOUS | Status: DC

## 2018-05-22 MED ORDER — POTASSIUM CHLORIDE 10 MEQ/50ML IV SOLN
10.0000 meq | INTRAVENOUS | Status: AC
  Administered 2018-05-22 (×4): 10 meq via INTRAVENOUS
  Filled 2018-05-22 (×4): qty 50

## 2018-05-22 MED ORDER — PIPERACILLIN-TAZOBACTAM 3.375 G IVPB
3.3750 g | Freq: Three times a day (TID) | INTRAVENOUS | Status: DC
  Administered 2018-05-22: 3.375 g via INTRAVENOUS
  Filled 2018-05-22 (×2): qty 50

## 2018-05-22 NOTE — Progress Notes (Signed)
Patient's EGD showed severe erosive esophagitis.  (Please see dictated procedure report.)  He was not bleeding at the time of the procedure and does not appear to be at significant risk for further bleeding, going forward.  Although it is conceivable that this esophagitis was the effect, rather than the cause, of the patient's bleeding (that is, that it arose from protracted vomiting from a GI bleed from another source, and/or that is is the result of esophageal irritation from his NG tube after he arrived at the hospital),  given the absence of any alternative explanation, such as aspirin induced gastropathy or ulcer disease, I think we have to assume that the esophagitis was the culprit.  Incidentally, the patient's son points out that the patient has been having a lot of "acid burps" and upper tract symptoms in recent weeks, although the reason for that is unclear.  This would certainly go along with erosive esophagitis.  Recommendations:  1.  We will sign off, since the patient appears to be at low risk for further bleeding.  However, please call us if we can be of further assistance in his management. 2.  Continue high-dose PPI therapy indefinitely. 3.  I encouraged the patient's son to contact me if the patient has residual upper tract symptoms following discharge from the hospital 4.  Ideally, if the patient were in better health, it would be ideal for him to be re-endoscoped in a couple of months to confirm healing of the esophagitis, but realistically, given the patient's comorbidities, if he is symptom-free on high-dose PPI therapy, I think it would be best to simply leave him on that, and not pursue things further.  Florencia Reasons, M.D. Pager [phone] If no answer or after 5 PM call [phone]

## 2018-05-22 NOTE — Progress Notes (Signed)
Initial Nutrition Assessment  DOCUMENTATION CODES:   Not applicable  INTERVENTION:    When able to begin enteral nutrition, recommend Vital AF 1.2 at 60 ml/h (1440 ml/d) to provide 1728 kcal, 108 gm protein, 1168 ml free water daily.  When feeding is started, monitor magnesium, potassium, and phosphorus daily for at least 3 days, MD to replete as needed, as pt is at risk for refeeding syndrome given suspected malnutrition with severe muscle depletion.  NUTRITION DIAGNOSIS:   Inadequate oral intake related to inability to eat as evidenced by NPO status.  GOAL:   Patient will meet greater than or equal to 90% of their needs  MONITOR:   Vent status, Labs, Skin, I & O's  REASON FOR ASSESSMENT:   Ventilator    ASSESSMENT:   77 yo male with PMH of HTN, DM, stroke, COPD, CAD, HLD, ischemic cardiomyopathy who was admitted on 11/26 with AMS, vomiting black material, hypotension, hypoxia. Intubated in the ED.   Discussed patient with RN today. Remains NPO. GI to talk with family today regarding potential endoscopy later today.   Patient is currently intubated on ventilator support MV: 14.4 L/min Temp (24hrs), Avg:98 F (36.7 C), Min:96.3 F (35.7 C), Max:99.9 F (37.7 C)   Labs reviewed. Potassium 3.2 (L), phosphorus 1.7 (L), magnesium 2.3 WNL CBG's: 127-119 Medications reviewed and include novolog, levophed, KCl.  Suspect severe malnutrition; currently not enough information available to accurately identify malnutrition.  NUTRITION - FOCUSED PHYSICAL EXAM:    Most Recent Value  Orbital Region  No depletion  Upper Arm Region  Unable to assess  Thoracic and Lumbar Region  Unable to assess  Buccal Region  Moderate depletion  Temple Region  Moderate depletion  Clavicle Bone Region  Severe depletion  Clavicle and Acromion Bone Region  Moderate depletion  Scapular Bone Region  Unable to assess  Dorsal Hand  Unable to assess  Patellar Region  Severe depletion   Anterior Thigh Region  Severe depletion  Posterior Calf Region  Severe depletion  Edema (RD Assessment)  None  Hair  Reviewed  Eyes  Unable to assess  Mouth  Unable to assess  Skin  Reviewed  Nails  Unable to assess       Diet Order:   Diet Order            Diet NPO time specified  Diet effective now              EDUCATION NEEDS:   No education needs have been identified at this time  Skin:  Skin Assessment: Skin Integrity Issues: Skin Integrity Issues:: DTI DTI: R heel  Last BM:  no BM since admission  Height:   Ht Readings from Last 1 Encounters:  05/21/18 5\' 7"  (1.702 m)    Weight:   Wt Readings from Last 1 Encounters:  05/21/18 56.2 kg    Ideal Body Weight:  67.3 kg  BMI:  Body mass index is 19.41 kg/m.  Estimated Nutritional Needs:   Kcal:  1730  Protein:  90-105 gm  Fluid:  1.8 L    Joaquin Courts, RD, LDN, CNSC Pager [phone] After Hours Pager [phone]

## 2018-05-22 NOTE — Interval H&P Note (Signed)
History and Physical Interval Note:  05/22/2018 3:27 PM  Charles Hatfield  has presented today for surgery, with the diagnosis of coffee ground emesis  The various methods of treatment have been discussed with the patient and family. After consideration of risks, benefits and other options for treatment, the patient has consented to  Procedure(s): ESOPHAGOGASTRODUODENOSCOPY (EGD) (N/A) as a surgical intervention .  The patient's history has been reviewed, patient examined, no change in status, stable for surgery.  I have reviewed the patient's chart and labs.  Questions were answered to the patient's satisfaction.     Katy Fitch [name]

## 2018-05-22 NOTE — Progress Notes (Signed)
(  Preliminary note, full note to follow)  Patient seen and examined.  Grossly stable from GI bleeding standpoint.  Have left message for his son to call me on the voicemail on the number provided on epic.  On standby to possibly do endoscopy later today.  Florencia Reasons, M.D. Pager [phone] If no answer or after 5 PM call [phone]

## 2018-05-22 NOTE — Progress Notes (Signed)
CRITICAL VALUE ALERT  Critical Value:  K+ 3.2  Date & Time Notied:  05/22/18 541  Provider Notified: Pola Corn  Orders Received/Actions taken: see note/orders

## 2018-05-22 NOTE — Consult Note (Signed)
Referring Provider:  Dr. Halford Chessman  (PCCM) Primary Care Physician:  Patient, No Pcp Per Primary Gastroenterologist: None (unassigned)  Reason for Consultation: Large volume dark emesis  HPI: Charles Hatfield is a 77 y.o. male admitted through the emergency room via EMS.  The history is obtained from review of the chart and a brief conversation over the phone with the patient's son, who found him yesterday morning, apparently obtunded, spewing large amounts of black material from his mouth.  There were concerns of aspiration because he was hypoxic as well as hypotensive in the emergency room, and he is now intubated and on pressors.  He is on a daily aspirin which his son gives him.  Apparently no history of coronary disease but had been on warfarin in the past.  Since admission, there has been some dark output out his orogastric tube but no hematochezia or melena, per conversation with the ICU nurse.  Past Medical History:  Diagnosis Date  . Atrial fibrillation ([place])    perioperative  . CAD (coronary artery disease)   . Carotid stenosis    bilateral  . COPD (chronic obstructive pulmonary disease) ([place])   . Critical lower limb ischemia   . Diabetes mellitus   . DM (diabetes mellitus) ([place]) 01/15/2012  . Dyslipidemia   . H/O ascending aorta repair   . Hematuria 01/15/2012  . Hypertension   . Ischemic cardiomyopathy   . Left ventricular apical thrombus following MI, NSTEMI, 01/13/12 01/15/2012  . LV (left ventricular) mural thrombus    inferapical  . S/P CABG x 3 01/22/2012   LIMA to LAD,SVG to second diagonal,SVG to distal RCA  . Staghorn renal calculus 01/15/2012  . Stroke ([place])   . Thrombocytopenia ([place])     Past Surgical History:  Procedure Laterality Date  . ABDOMINAL ANGIOGRAM  03/03/2013   Procedure: ABDOMINAL ANGIOGRAM;  Surgeon: Lorretta Harp, MD;  Location: [facility] CATH LAB;  Service: Cardiovascular;;  . CAROTID ENDARTERECTOMY  01/13/2012  . CORONARY ANGIOGRAM  01/16/2012   Procedure:  CORONARY ANGIOGRAM;  Surgeon: Sanda Klein, MD;  Location: [facility] CATH LAB;  Service: Cardiovascular;;  . CORONARY ARTERY BYPASS GRAFT  01/22/2012   Procedure: CORONARY ARTERY BYPASS GRAFTING (CABG);  Surgeon: Ivin Poot, MD;  Location: [place];  Service: Open Heart Surgery;  Laterality: N/A;  CABG x three using left internal mammary artery and right leg greater saphenous vein harvested endoscopically  . ENDARTERECTOMY  01/22/2012   Procedure: ENDARTERECTOMY CAROTID;  Surgeon: Rosetta Posner, MD;  Location: [place];  Service: Vascular;  Laterality: Left;  . LOWER EXTREMITY ANGIOGRAM Left 03/03/2013   Procedure: LOWER EXTREMITY ANGIOGRAM;  Surgeon: Lorretta Harp, MD;  Location: [facility] CATH LAB;  Service: Cardiovascular;  Laterality: Left;  . NM MYOVIEW LTD  02/20/2012   infarct/scar w/mild perinfarct ischemia, EF 38%, abnormal nuc, no sign. ischemia  . REPAIR THORACIC AORTA     Ascending thoracic aorto repair    Prior to Admission medications   Medication Sig Start Date End Date Taking? Authorizing Provider  acetaminophen (TYLENOL) 500 MG tablet Take 1,000 mg by mouth every 6 (six) hours as needed for pain.    Yes [provider]  aspirin EC 81 MG tablet Take 81 mg by mouth daily.   Yes [provider]  diphenhydramine-acetaminophen (TYLENOL PM EXTRA STRENGTH) 25-500 MG TABS tablet Take 1-2 tablets by mouth at bedtime as needed (sleep).   Yes [provider]  Maltodextrin-Xanthan Gum (RESOURCE THICKENUP CLEAR) POWD Take 360  g by mouth as needed. 07/13/17  Yes Angiulli, Lavon Paganini, PA-C  nitroGLYCERIN (NITROSTAT) 0.4 MG SL tablet DISSOLVE ONE TABLET UNDER THE TONGUE EVERY 5 MINUTES AS NEEDED FOR CHEST PAIN.  DO NOT EXCEED A TOTAL OF 3 DOSES IN 15 MINUTES NOW 07/13/17  Yes Angiulli, Lavon Paganini, PA-C  nicotine (NICODERM CQ - DOSED IN MG/24 HR) 7 mg/24hr patch 7 mg patch daily 3 weeks and stop 07/13/17   Angiulli, Lavon Paganini, PA-C    Current Facility-Administered Medications  Medication  Dose Route Frequency Provider Last Rate Last Dose  . 0.9 %  sodium chloride infusion   Intravenous Continuous Chesley Mires, MD   Stopped at 05/22/18 0941  . ampicillin-sulbactam (UNASYN) 1.5 g in sodium chloride 0.9 % 100 mL IVPB  1.5 g Intravenous Q12H Agarwala, Ravi, MD      . chlorhexidine gluconate (MEDLINE KIT) (PERIDEX) 0.12 % solution 15 mL  15 mL Mouth Rinse BID Chesley Mires, MD   15 mL at 05/22/18 0845  . fentaNYL (SUBLIMAZE) injection 50 mcg  50 mcg Intravenous Q2H PRN Chesley Mires, MD   50 mcg at 05/21/18 1538  . insulin aspart (novoLOG) injection 1-3 Units  1-3 Units Subcutaneous Q4H Chesley Mires, MD   1 Units at 05/22/18 1114  . MEDLINE mouth rinse  15 mL Mouth Rinse 10 times per day Chesley Mires, MD   15 mL at 05/22/18 1219  . midazolam (VERSED) injection 1 mg  1 mg Intravenous Q2H PRN Chesley Mires, MD   1 mg at 05/21/18 1538  . norepinephrine (LEVOPHED) 18m in D5W 2518mpremix infusion  0-40 mcg/min Intravenous Continuous PfCharlesetta ShanksMD 18.75 mL/hr at 05/22/18 1300 5 mcg/min at 05/22/18 1300  . pantoprazole (PROTONIX) injection 40 mg  40 mg Intravenous Q12H AgKipp BroodMD   40 mg at 05/22/18 1245  . succinylcholine (ANECTINE) injection 120 mg  120 mg Intravenous Once PfCharlesetta ShanksMD      . [SDerrill MemoN 05/23/2018] vancomycin (VANCOCIN) IVPB 750 mg/150 ml premix  750 mg Intravenous Q24H SoChesley MiresMD        Allergies as of 05/21/2018  . (No Known Allergies)    Family History  Problem Relation Age of Onset  . Heart disease Father   . Heart attack Brother     Social History   Socioeconomic History  . Marital status: Widowed    Spouse name: Not on file  . Number of children: Not on file  . Years of education: Not on file  . Highest education level: Not on file  Occupational History  . Not on file  Social Needs  . Financial resource strain: Not on file  . Food insecurity:    Worry: Not on file    Inability: Not on file  . Transportation needs:     Medical: Not on file    Non-medical: Not on file  Tobacco Use  . Smoking status: Former Smoker    Packs/day: 1.00    Types: Cigarettes    Last attempt to quit: 01/13/2012    Years since quitting: 6.3  . Smokeless tobacco: Never Used  Substance and Sexual Activity  . Alcohol use: No  . Drug use: No  . Sexual activity: Never  Lifestyle  . Physical activity:    Days per week: Not on file    Minutes per session: Not on file  . Stress: Not on file  Relationships  . Social connections:    Talks on phone: Not on file  Gets together: Not on file    Attends religious service: Not on file    Active member of club or organization: Not on file    Attends meetings of clubs or organizations: Not on file    Relationship status: Not on file  . Intimate partner violence:    Fear of current or ex partner: Not on file    Emotionally abused: Not on file    Physically abused: Not on file    Forced sexual activity: Not on file  Other Topics Concern  . Not on file  Social History Narrative  . Not on file    Review of Systems: Not obtainable, patient intubated  Physical Exam: Vital signs in last 24 hours: Temp:  [96.3 F (35.7 C)-99.9 F (37.7 C)] 99 F (37.2 C) (11/27 1300) Pulse Rate:  [39-102] 81 (11/27 1300) Resp:  [13-30] 30 (11/27 1300) BP: (85-140)/(41-96) 123/54 (11/27 1300) SpO2:  [95 %-100 %] 100 % (11/27 1300) FiO2 (%):  [40 %-100 %] 40 % (11/27 1116) Weight:  [56.2 kg] 56.2 kg (11/26 2158) Last BM Date: (PTA)  This is a somewhat chronically ill-appearing elderly gentleman not responsive to voice, on the ventilator but (per conversation with the ICU nurse) not sedated.  He is anicteric and without frank pallor.  The skin is reasonably warm.  Chest is clear anteriorly, heart currently without arrhythmia, abdomen without mass or tenderness.  No obvious stigmata of chronic liver disease.  Intake/Output from previous day: 11/26 0701 - 11/27 0700 In: 1523 [I.V.:433.9; IV  Piggyback:1089.1] Out: 500 [Urine:350; Emesis/NG output:150] Intake/Output this shift: Total I/O In: 885.5 [I.V.:453.3; IV Piggyback:432.3] Out: 225 [Urine:175; Emesis/NG output:50]  Lab Results: Recent Labs    05/21/18 1316 05/21/18 2028 05/22/18 0403  WBC 20.3*  --  14.5*  HGB 14.9 11.1* 10.2*  HCT 49.2 35.4* 32.7*  PLT 198  --  142*   BMET Recent Labs    05/21/18 1314 05/21/18 1316 05/22/18 0403  NA 144 146* 145  K 4.1 4.2 3.2*  CL 113* 110 122*  CO2  --  15* 17*  GLUCOSE 218* 223* 149*  BUN 42* 39* 35*  CREATININE 2.60* 2.97* 1.69*  CALCIUM  --  9.0 7.3*   LFT Recent Labs    05/22/18 0403  PROT 4.0*  ALBUMIN 1.6*  AST 20  ALT 10  ALKPHOS 39  BILITOT 0.6   PT/INR Recent Labs    05/21/18 1316  LABPROT 17.0*  INR 1.40    Studies/Results: Ct Abdomen Pelvis Wo Contrast  Result Date: 05/21/2018 CLINICAL DATA:  Shortness of breath EXAM: CT CHEST, ABDOMEN AND PELVIS WITHOUT CONTRAST TECHNIQUE: Multidetector CT imaging of the chest, abdomen and pelvis was performed following the standard protocol without IV contrast. COMPARISON:  01/15/2012 abdominal CT FINDINGS: CT CHEST FINDINGS Cardiovascular: Normal heart size. No pericardial effusion. Extensive coronary and aortic atherosclerosis. Mediastinum/Nodes: Endotracheal tube tip at the clavicular heads. An orogastric tube reaches the stomach. Lungs/Pleura: Multi segment consolidation in the right lower lobe with airway thickening. The dependent right more than left lower lungs also has clustered micro nodularity and ground-glass density. Debris is seen within the bronchus intermedius. Musculoskeletal: No acute or aggressive finding CT ABDOMEN PELVIS FINDINGS Hepatobiliary: No focal liver abnormality.No evidence of biliary obstruction or stone. Pancreas: Unremarkable. Spleen: Unremarkable. Adrenals/Urinary Tract: Negative adrenals. Bulky branching left renal calculus distending the left renal pelvis. There is also a  right renal calculus at the pelvis. No hydronephrosis. Foley catheter with collapsed bladder. Stomach/Bowel:  Gastric suction tube is coiled in the stomach. No bowel wall thickening or obstruction Vascular/Lymphatic: Aortic iliac bypass graft. Atherosclerotic calcification is extensive. No mass or adenopathy. Reproductive:No pathologic findings. Other: No ascites or pneumoperitoneum. Musculoskeletal: No acute or aggressive finding IMPRESSION: 1. Bilateral airspace disease with dense consolidation in the right lower lobe, likely aspiration. 2. No acute finding in the abdomen. 3. Bilateral nephrolithiasis with large branching stone on the left. No hydronephrosis. Electronically Signed   By: Monte Fantasia M.D.   On: 05/21/2018 15:54   Ct Head Wo Contrast  Result Date: 05/21/2018 CLINICAL DATA:  Altered LOC EXAM: CT HEAD WITHOUT CONTRAST TECHNIQUE: Contiguous axial images were obtained from the base of the skull through the vertex without intravenous contrast. COMPARISON:  MRI 06/26/2017, CT brain 06/25/2017 FINDINGS: Brain: No hemorrhage or intracranial mass is visualized. Multifocal encephalomalacia involving the bilateral frontal lobes, left occipital lobe, and right parietooccipital lobe. Old lacunar infarcts within the basal ganglia and thalamus. New hypodensity at the right cranial vertex. Hypodensity within the right posterior temporal and parietal lobes. Atrophy. Small vessel ischemic changes of the white matter. Stable ventricle size. Vascular: No hyperdense vessel. Carotid vascular calcification and vertebral artery calcification. Skull: Normal. Negative for fracture or focal lesion. Sinuses/Orbits: Chronic fracture medial wall left orbit. Mucosal thickening in the ethmoid sinuses. No acute orbital abnormality Other: None IMPRESSION: 1. Small focal hypodensity at the right posterior cranial vertex with additional moderate hypodensity at the right posterior temporal and parietal lobes suspicious for acute  or subacute infarcts. No hemorrhage. Chronic multifocal encephalomalacia and chronic appearing lacunar infarcts within the thalamus and basal ganglia. 2. Atrophy with small vessel ischemic changes of the white matter. Electronically Signed   By: Donavan Foil M.D.   On: 05/21/2018 15:59   Ct Chest Wo Contrast  Result Date: 05/21/2018 CLINICAL DATA:  Shortness of breath EXAM: CT CHEST, ABDOMEN AND PELVIS WITHOUT CONTRAST TECHNIQUE: Multidetector CT imaging of the chest, abdomen and pelvis was performed following the standard protocol without IV contrast. COMPARISON:  01/15/2012 abdominal CT FINDINGS: CT CHEST FINDINGS Cardiovascular: Normal heart size. No pericardial effusion. Extensive coronary and aortic atherosclerosis. Mediastinum/Nodes: Endotracheal tube tip at the clavicular heads. An orogastric tube reaches the stomach. Lungs/Pleura: Multi segment consolidation in the right lower lobe with airway thickening. The dependent right more than left lower lungs also has clustered micro nodularity and ground-glass density. Debris is seen within the bronchus intermedius. Musculoskeletal: No acute or aggressive finding CT ABDOMEN PELVIS FINDINGS Hepatobiliary: No focal liver abnormality.No evidence of biliary obstruction or stone. Pancreas: Unremarkable. Spleen: Unremarkable. Adrenals/Urinary Tract: Negative adrenals. Bulky branching left renal calculus distending the left renal pelvis. There is also a right renal calculus at the pelvis. No hydronephrosis. Foley catheter with collapsed bladder. Stomach/Bowel: Gastric suction tube is coiled in the stomach. No bowel wall thickening or obstruction Vascular/Lymphatic: Aortic iliac bypass graft. Atherosclerotic calcification is extensive. No mass or adenopathy. Reproductive:No pathologic findings. Other: No ascites or pneumoperitoneum. Musculoskeletal: No acute or aggressive finding IMPRESSION: 1. Bilateral airspace disease with dense consolidation in the right lower  lobe, likely aspiration. 2. No acute finding in the abdomen. 3. Bilateral nephrolithiasis with large branching stone on the left. No hydronephrosis. Electronically Signed   By: Monte Fantasia M.D.   On: 05/21/2018 15:54   Dg Chest Port 1 View  Result Date: 05/22/2018 CLINICAL DATA:  Acute respiratory failure EXAM: PORTABLE CHEST 1 VIEW COMPARISON:  05/21/2018 FINDINGS: Endotracheal tube, nasogastric catheter and right jugular central line are again  seen and stable. Cardiac shadow is accentuated by the portable technique but stable. Previously seen right-sided pleural effusion is less well appreciated on today's exam. This may be in part due to patient positioning. Improved aeration in the right base is noted although some persistent opacity is seen. Left lung remains clear. IMPRESSION: Improved aeration in the right lung base. Reduction in the degree of pleural effusion although this may be in part positional in nature. Tubes and lines as described. Electronically Signed   By: Inez Catalina M.D.   On: 05/22/2018 07:47   Dg Chest Port 1 View  Result Date: 05/21/2018 CLINICAL DATA:  Initial evaluation for acute respiratory ule failure, central line placement. EXAM: PORTABLE CHEST 1 VIEW COMPARISON:  Prior radiograph and CT from 05/21/2018 FINDINGS: Endotracheal tube in place with tip positioned 6.7 cm above the carina. Enteric tube courses into the abdomen, tip in side hole overlying the stomach, beyond the GE junction. Right IJ approach centra venous catheter tip overlies the mid SVC. Median sternotomy wires underlying CABG markers noted. Transverse heart size stable, and remains within normal limits. Mediastinal silhouette normal. Aortic atherosclerosis. Failing opacity overlying the right hemidiaphragm consistent with layering right pleural effusion. Associated right basilar opacity could reflect atelectasis or infiltrate. Left lung remains largely clear. No overt pulmonary edema. No pneumothorax. No  acute osseous abnormality. IMPRESSION: 1. Support apparatus in satisfactory position as above. 2. Moderate layering right pleural effusion with associated right basilar opacity, could reflect atelectasis or infiltrate. Electronically Signed   By: Jeannine Boga M.D.   On: 05/21/2018 16:49   Dg Chest Port 1 View  Result Date: 05/21/2018 CLINICAL DATA:  77 year old male intubated status post respiratory distress. EXAM: PORTABLE CHEST 1 VIEW COMPARISON:  Chest radiographs 07/04/2017 and earlier. FINDINGS: Portable AP supine view at 1318 hours. Endotracheal tube tip projects over the midline and terminates at the level the clavicles approximately 7 centimeters above the carina. Enteric tube courses through the midline mediastinum to the abdomen, tip not included. Larger lung volumes. There is subtle asymmetric reticulonodular density in the right lower lung medially. Otherwise when allowing for portable technique the lungs are clear. Stable cardiac size and mediastinal contours. Prior CABG. No acute osseous abnormality identified. IMPRESSION: 1. Endotracheal tube tip 7 cm above the carina. Enteric tube courses to the abdomen, tip not included. 2. Reticulonodular opacity in the medial right lower lung. Consider aspiration and/or pneumonia. Electronically Signed   By: Genevie Ann M.D.   On: 05/21/2018 13:38    Impression: Upper GI bleed, possibly related to aspirin exposure  Plan: Purpose and risks of endoscopy were reviewed with the patient's son, Legrand Como, over the telephone and he is desirous of proceeding.  This will be accomplished later today at the bedside.   LOS: 1 day   [place]  05/22/2018, 2:14 PM   Pager [phone] If no answer or after 5 PM call [phone]

## 2018-05-22 NOTE — Progress Notes (Signed)
Pt in recovery stage at 1550

## 2018-05-22 NOTE — Op Note (Signed)
[facility] Patient Name: Charles Hatfield Procedure Date : 05/22/2018 MRN: [id number] Attending MD: Bernette Redbird , MD Date of Birth: [birth year] CSN: [id number] Age: 77 Admit Type: Inpatient Procedure:                Upper GI endoscopy Indications:              Acute post hemorrhagic anemia, Hematemesis Providers:                Bernette Redbird, MD, Jacquiline Doe, RN, Verita Schneiders, Technician Referring MD:              Medicines:                Midazolam 1 mg IV--patient intubated, done at                            bedside in ICU Complications:            No immediate complications. Estimated Blood Loss:     Estimated blood loss: none. Procedure:                Pre-Anesthesia Assessment:                           - Prior to the procedure, a History and Physical                            was performed, and patient medications and                            allergies were reviewed. The patient's tolerance of                            previous anesthesia was also reviewed. The risks                            and benefits of the procedure and the sedation                            options and risks were discussed with the patient.                            All questions were answered, and informed consent                            was obtained. Prior Anticoagulants: The patient has                            taken aspirin, last dose was 2 days prior to                            procedure. ASA Grade Assessment: IV - A patient  with severe systemic disease that is a constant                            threat to life. After reviewing the risks and                            benefits, the patient was deemed in satisfactory                            condition to undergo the procedure.                           After obtaining informed consent, the endoscope was                            passed under direct vision  alongside his                            endotracheal tube and orogastric tube. Throughout                            the procedure, the patient's blood pressure, pulse,                            and oxygen saturations were monitored continuously.                            The GIF-H190 ([id number]) Olympus Adult EGD was                            introduced through the mouth, and advanced to the                            second part of duodenum. The upper GI endoscopy was                            accomplished without difficulty. The patient                            tolerated the procedure well. Scope In: Scope Out: Findings:      Diffuse severe mucosal changes characterized by inflammation were found       in the entire esophagus. There was diffuse erosive change, with exudate       and darkening of the mucosa. No deep ulceration, no discrete stigmata of       hemorrhage, no friability.      A moderate Schatzki ring with minimal narrowing was found at the       gastroesophageal junction above a small hiatal hernia.      The entire examined stomach was normal. No blood or coffee grounds or       clot were seen.      There is no endoscopic evidence of erythema or inflammatory changes       suggestive of gastritis, inflammation, ulceration, angiodysplasia or       erosion in the entire  examined stomach. In particular, there was no       evidence of ASA gastropathy.      The cardia and gastric fundus were normal on retroflexion apart from a       small hiatal hernia.      Moderately congested mucosa without active bleeding and with no stigmata       of bleeding was found in the second portion of the duodenum.      The stomach and duodenum were checked twice, without evidence of any       source of bleeding and in particular no evidence of an ulcer in those       locations.      Patchy mildly erythematous mucosa without active bleeding and with no       stigmata of bleeding was found  in the duodenal bulb. Impression:               - Inflamed mucosa in the esophagus. It is presumed                            that this was the source of the patient's bleeding.                            Retrospectively, the patient's son points out that                            the patient had been complaining of "acid burps"                            alot in recent weeks.                           - Mild Schatzki ring.                           - Normal stomach.                           - Congested duodenal mucosa, without ulceration.                           - Erythematous duodenopathy.                           - No specimens collected. Recommendation:           - Continue present medications.                           - F/u egd not required, given patient's medical                            problems, BUT I spooke with his son and encouraged                            them to contact me if he has residual GI symptoms  following discharge. Procedure Code(s):        --- Professional ---                           [id number], Esophagogastroduodenoscopy, flexible,                            transoral; diagnostic, including collection of                            specimen(s) by brushing or washing, when performed                            (separate procedure) Diagnosis Code(s):        --- Professional ---                           K20.9, Esophagitis, unspecified                           K22.2, Esophageal obstruction                           D62, Acute posthemorrhagic anemia                           K92.0, Hematemesis CPT copyright 2018 [employer]. All rights reserved. The codes documented in this report are preliminary and upon coder review may  be revised to meet current compliance requirements. Bernette Redbird, MD 05/22/2018 4:25:23 PM This report has been signed electronically. Number of Addenda: 0

## 2018-05-22 NOTE — Progress Notes (Signed)
PHARMACY - PHYSICIAN COMMUNICATION CRITICAL VALUE ALERT - BLOOD CULTURE IDENTIFICATION (BCID)  Charles Hatfield is an 77 y.o. male who presented to [facility] on 05/21/2018 with a chief complaint of hypoxia/aspiration PNA  Assessment:  2/2  Blood cultures growing MR-CoNS    Name of physician (or Provider) Contacted:  Dr. Arsenio Loader  Current antibiotics: Zosyn  Changes to prescribed antibiotics recommended:   Adding Vancomycin   Results for orders placed or performed during the hospital encounter of 05/21/18  Blood Culture ID Panel (Reflexed) (Collected: 05/21/2018  1:42 PM)  Result Value Ref Range   Enterococcus species NOT DETECTED NOT DETECTED   Listeria monocytogenes NOT DETECTED NOT DETECTED   Staphylococcus species DETECTED (A) NOT DETECTED   Staphylococcus aureus (BCID) NOT DETECTED NOT DETECTED   Methicillin resistance DETECTED (A) NOT DETECTED   Streptococcus species NOT DETECTED NOT DETECTED   Streptococcus agalactiae NOT DETECTED NOT DETECTED   Streptococcus pneumoniae NOT DETECTED NOT DETECTED   Streptococcus pyogenes NOT DETECTED NOT DETECTED   Acinetobacter baumannii NOT DETECTED NOT DETECTED   Enterobacteriaceae species NOT DETECTED NOT DETECTED   Enterobacter cloacae complex NOT DETECTED NOT DETECTED   Escherichia coli NOT DETECTED NOT DETECTED   Klebsiella oxytoca NOT DETECTED NOT DETECTED   Klebsiella pneumoniae NOT DETECTED NOT DETECTED   Proteus species NOT DETECTED NOT DETECTED   Serratia marcescens NOT DETECTED NOT DETECTED   Haemophilus influenzae NOT DETECTED NOT DETECTED   Neisseria meningitidis NOT DETECTED NOT DETECTED   Pseudomonas aeruginosa NOT DETECTED NOT DETECTED   Candida albicans NOT DETECTED NOT DETECTED   Candida glabrata NOT DETECTED NOT DETECTED   Candida krusei NOT DETECTED NOT DETECTED   Candida parapsilosis NOT DETECTED NOT DETECTED   Candida tropicalis NOT DETECTED NOT DETECTED    [name] 05/22/2018  5:45 AM

## 2018-05-22 NOTE — Progress Notes (Signed)
eLink Physician-Brief Progress Note Patient Name: JUDAS PETTITT DOB: 1941-06-20 MRN: [id number]   Date of Service  05/22/2018  HPI/Events of Note  K+ = 3.2 and Creatinine = 1.69.   eICU Interventions  Will replace  K+.     Intervention Category Major Interventions: Electrolyte abnormality - evaluation and management  Leta Bucklin Eugene 05/22/2018, 6:09 AM

## 2018-05-22 NOTE — Consult Note (Signed)
WOC Nurse wound consult note Patient receiving care in [facility] 2M09.  No family present. Reason for Consult: Right lateral heel DTPI Wound type: Patient keeps his feet crossed.  Foam dressings are in place. There is a DTPI to the right lateral heel.  The entire erythematous area measures 3 cm x 4 cm with a small central discolored purple spot.  The primary RN tells me that Prevalon boots have been ordered.  Use of these is what I would have recommended. Pressure Injury POA: No Monitor the wound area(s) for worsening of condition such as: Signs/symptoms of infection,  Increase in size,  Development of or worsening of odor, Development of pain, or increased pain at the affected locations.  Notify the medical team if any of these develop.  Thank you for the consult.  Discussed plan of care with the patient and bedside nurse.  WOC nurse will not follow at this time.  Please re-consult the WOC team if needed.  Helmut Muster, RN, MSN, CWOCN, CNS-BC, pager [phone]

## 2018-05-22 NOTE — Progress Notes (Signed)
NAME:  Charles Hatfield, MRN:  [id number], DOB:  1941-01-14, LOS: 1 ADMISSION DATE:  05/21/2018, CONSULTATION DATE:  05/21/2018 REFERRING MD:  Donnald Garre, CHIEF COMPLAINT:  Black "coffee ground like" emesis   Brief History   77 yo male brought to ER with altered mental status, vomiting black material, hypotension, hypoxia.  Intubated in ER.  Concern for aspiration pneumonia and upper GI bleeding.  Significant Hospital Events   11/26 - admission and intubation  Consults:  11/27 GI consult  Procedures:  ETT 11/26 >> Upper Endoscopy   Significant Diagnostic Tests:  CT abd/pelvis 11/26 >> Echo 11/26 >>  Micro Data:  Blood 11/26 >> Urine 11/26 >> Sputum 11/26 >>  Antimicrobials:  Zosyn 11/26 >> 11/27 Unasyn 11/27 >> Vancomycin 11/27 >>  Interim history/subjective:  Patient this morning still on vent but able to open eyes and acknowledge verbal response  Objective   Blood pressure (!) 112/52, pulse (!) 59, temperature 99 F (37.2 C), resp. rate (!) 21, height 5\' 7"  (1.702 m), weight 56.2 kg, SpO2 100 %.    Vent Mode: PRVC FiO2 (%):  [40 %-100 %] 40 % Set Rate:  [14 bmp] 14 bmp Vt Set:  [520 mL] 520 mL PEEP:  [5 cmH20] 5 cmH20 Plateau Pressure:  [13 cmH20-19 cmH20] 13 cmH20   Intake/Output Summary (Last 24 hours) at 05/22/2018 1105 Last data filed at 05/22/2018 0900 Gross per 24 hour  Intake 1988.13 ml  Output 700 ml  Net 1288.13 ml   Filed Weights   05/21/18 2158  Weight: 56.2 kg    Examination: General: ill appearing, frail HENT: ETT tube in place, NGT in place Lungs: coarse breath sounds bilaterally in upper lung fields Cardiovascular: RRR, no murmurs Abdomen: soft, non-distended, non-tender, +bowel sounds Extremities: no cyanosis, trace pedal edema bilaterally Neuro: sedated, unable to perform GU: foley in place  Labs   CBC: Recent Labs  Lab 05/21/18 1314 05/21/18 1316 05/21/18 2028 05/22/18 0403  WBC  --  20.3*  --  14.5*  NEUTROABS  --  20.1*   --   --   HGB 14.6 14.9 11.1* 10.2*  HCT 43.0 49.2 35.4* 32.7*  MCV  --  107.4*  --  104.5*  PLT  --  198  --  142*    Basic Metabolic Panel: Recent Labs  Lab 05/21/18 1314 05/21/18 1316 05/21/18 2117 05/22/18 0403  NA 144 146*  --  145  K 4.1 4.2  --  3.2*  CL 113* 110  --  122*  CO2  --  15*  --  17*  GLUCOSE 218* 223*  --  149*  BUN 42* 39*  --  35*  CREATININE 2.60* 2.97*  --  1.69*  CALCIUM  --  9.0  --  7.3*  MG  --   --  1.6*  --    GFR: Estimated Creatinine Clearance: 29.1 mL/min (A) (by C-G formula based on SCr of 1.69 mg/dL (H)). Recent Labs  Lab 05/21/18 1314 05/21/18 1316 05/21/18 1539 05/21/18 2028 05/22/18 0403  WBC  --  20.3*  --   --  14.5*  LATICACIDVEN 13.14*  --  2.9* 2.9*  --     Liver Function Tests: Recent Labs  Lab 05/21/18 1316 05/22/18 0403  AST 39 20  ALT 17 10  ALKPHOS 68 39  BILITOT 1.2 0.6  PROT 5.9* 4.0*  ALBUMIN 2.4* 1.6*   Recent Labs  Lab 05/21/18 1316  LIPASE 18   No results for  input(s): AMMONIA in the last 168 hours.  ABG    Component Value Date/Time   PHART 7.383 05/22/2018 0318   PCO2ART 24.7 (L) 05/22/2018 0318   PO2ART 133.0 (H) 05/22/2018 0318   HCO3 14.6 (L) 05/22/2018 0318   TCO2 15 (L) 05/22/2018 0318   ACIDBASEDEF 9.0 (H) 05/22/2018 0318   O2SAT 99.0 05/22/2018 0318     Coagulation Profile: Recent Labs  Lab 05/21/18 1316  INR 1.40    Cardiac Enzymes: No results for input(s): CKTOTAL, CKMB, CKMBINDEX, TROPONINI in the last 168 hours.  HbA1C: Hgb A1c MFr Bld  Date/Time Value Ref Range Status  06/26/2017 05:00 AM 4.9 4.8 - 5.6 % Final    Comment:    (NOTE) Pre diabetes:          5.7%-6.4% Diabetes:              >6.4% Glycemic control for   <7.0% adults with diabetes   01/20/2012 05:11 AM 5.3 <5.7 % Final    Comment:    (NOTE)                                                                       According to the ADA Clinical Practice Recommendations for 2011, when HbA1c is used as a  screening test:  >=6.5%   Diagnostic of Diabetes Mellitus           (if abnormal result is confirmed) 5.7-6.4%   Increased risk of developing Diabetes Mellitus References:Diagnosis and Classification of Diabetes Mellitus,Diabetes Care,2011,34(Suppl 1):S62-S69 and Standards of Medical Care in         Diabetes - 2011,Diabetes Care,2011,34 (Suppl 1):S11-S61.    CBG: Recent Labs  Lab 05/21/18 1650 05/21/18 1939 05/21/18 2351 05/22/18 0358 05/22/18 0728  GLUCAP 166* 157* 102* 127* 119[facility] Problem list     Assessment & Plan:  Acute respiratory failure with hypoxia. Plan - full vent support - not ready for extubation today - cont to monitor for daily SBT  Aspiration pneumonia with septic shock and MRSA bacteremia. Plan - d/c'd zosyn; start Unasyn and Vancomycin - continue IV fluids - pressors to keep MAP > 65  Upper GI bleeding. Plan - Hgb stable - GI consult for upper GI endoscopy - protonix 40mg  BID  Metabolic acidosis. Acute renal failure from ATN. Plan - baseline creatinine 1.15 from 07/06/17 - continue resuscitation  - f/u BMET, lactic acid  Hx of CAD, LV mural thrombus, ischemic CM. Plan - f/u Echo - hold ASA  DM. Plan  - SSI  Severe protein calorie malnutrition. Plan - NPO  Best practice:  Diet: NPO DVT prophylaxis: SCD GI prophylaxis: protonix gtt Mobility: bed rest Code Status: full code Family Communication: updated pt's son at bedside Disposition: admit to ICU  Prior to Admission medications   Medication Sig Start Date End Date Taking? Authorizing Provider  acetaminophen (TYLENOL) 500 MG tablet Take 1,000 mg by mouth every 6 (six) hours as needed for pain.    Yes [provider]  aspirin EC 81 MG tablet Take 81 mg by mouth daily.   Yes [provider]  diphenhydramine-acetaminophen (TYLENOL PM EXTRA STRENGTH) 25-500 MG TABS tablet Take 1-2 tablets by mouth at bedtime as needed (  sleep).   Yes  [provider]  Maltodextrin-Xanthan Gum (RESOURCE THICKENUP CLEAR) POWD Take 360 g by mouth as needed. 07/13/17  Yes Angiulli, Mcarthur Rossetti, PA-C  nitroGLYCERIN (NITROSTAT) 0.4 MG SL tablet DISSOLVE ONE TABLET UNDER THE TONGUE EVERY 5 MINUTES AS NEEDED FOR CHEST PAIN.  DO NOT EXCEED A TOTAL OF 3 DOSES IN 15 MINUTES NOW 07/13/17  Yes Angiulli, Mcarthur Rossetti, PA-C  nicotine (NICODERM CQ - DOSED IN MG/24 HR) 7 mg/24hr patch 7 mg patch daily 3 weeks and stop 07/13/17   Angiulli, Mcarthur Rossetti, PA-C     Critical care time: > 30 min    Jules Schick, DO [facility], PGY-2

## 2018-05-23 ENCOUNTER — Inpatient Hospital Stay (HOSPITAL_COMMUNITY): Payer: Medicare Other

## 2018-05-23 DIAGNOSIS — I351 Nonrheumatic aortic (valve) insufficiency: Secondary | ICD-10-CM

## 2018-05-23 DIAGNOSIS — I340 Nonrheumatic mitral (valve) insufficiency: Secondary | ICD-10-CM

## 2018-05-23 LAB — ECHOCARDIOGRAM COMPLETE
HEIGHTINCHES: 67 in
Weight: 1982.38 oz

## 2018-05-23 LAB — GLUCOSE, CAPILLARY
GLUCOSE-CAPILLARY: 101 mg/dL — AB (ref 70–99)
GLUCOSE-CAPILLARY: 82 mg/dL (ref 70–99)
GLUCOSE-CAPILLARY: 84 mg/dL (ref 70–99)
Glucose-Capillary: 102 mg/dL — ABNORMAL HIGH (ref 70–99)
Glucose-Capillary: 111 mg/dL — ABNORMAL HIGH (ref 70–99)

## 2018-05-23 LAB — BASIC METABOLIC PANEL
Anion gap: 4 — ABNORMAL LOW (ref 5–15)
BUN: 26 mg/dL — ABNORMAL HIGH (ref 8–23)
CHLORIDE: 124 mmol/L — AB (ref 98–111)
CO2: 16 mmol/L — AB (ref 22–32)
Calcium: 7.4 mg/dL — ABNORMAL LOW (ref 8.9–10.3)
Creatinine, Ser: 1.43 mg/dL — ABNORMAL HIGH (ref 0.61–1.24)
GFR calc non Af Amer: 47 mL/min — ABNORMAL LOW (ref >60)
GFR, EST AFRICAN AMERICAN: 54 mL/min — AB (ref >60)
Glucose, Bld: 130 mg/dL — ABNORMAL HIGH (ref 70–99)
POTASSIUM: 3.7 mmol/L (ref 3.5–5.1)
Sodium: 144 mmol/L (ref 135–145)

## 2018-05-23 LAB — URINE CULTURE: Culture: 20000 — AB

## 2018-05-23 LAB — MAGNESIUM: Magnesium: 2 mg/dL (ref 1.7–2.4)

## 2018-05-23 MED ORDER — FREE WATER
200.0000 mL | Status: DC
  Administered 2018-05-23 – 2018-05-24 (×5): 200 mL

## 2018-05-23 NOTE — Progress Notes (Addendum)
   NAME:  Charles Hatfield, MRN:  [id number], DOB:  [birth year], LOS: 2 ADMISSION DATE:  05/21/2018, CONSULTATION DATE:  05/21/2018 REFERRING MD:  Donnald Garre, CHIEF COMPLAINT:  Black "coffee ground like" emesis   Brief History   77 yo male brought to ER with altered mental status, vomiting black material, hypotension, hypoxia.  Intubated in ER.  Concern for aspiration pneumonia and upper GI bleeding.  Significant Hospital Events   11/26 - admission and intubation 11/28: Off pressors.  Blood growing gram-positive cocci with prelim suggesting MRSA Consults:  11/27 GI consult  Procedures:  ETT 11/26 >> Upper Endoscopy   Significant Diagnostic Tests:  CT abd/pelvis 11/26 >> Echo 11/26 >> EF is 40 to 45%, inferior and lateral hypokinesis.  No vegetation  Micro Data:  Blood 11/26 >> gram-positive cocci Urine 11/26 >> E. coli Sputum 11/26 >> pending  Antimicrobials:  Zosyn 11/26 >> 11/27 Unasyn 11/27 >> Vancomycin 11/27 >>  Interim history/subjective:  Patient this morning still on vent but able to open eyes and acknowledge verbal response  Objective   Blood pressure (Abnormal) 89/46, pulse 67, temperature 100 F (37.8 C), resp. rate (Abnormal) 26, height 5\' 7"  (1.702 m), weight 56.2 kg, SpO2 100 %.    Vent Mode: PRVC FiO2 (%):  [40 %] 40 % Set Rate:  [14 bmp] 14 bmp Vt Set:  [520 mL] 520 mL PEEP:  [5 cmH20] 5 cmH20 Plateau Pressure:  [9 cmH20-16 cmH20] 12 cmH20   Intake/Output Summary (Last 24 hours) at 05/23/2018 1713 Last data filed at 05/23/2018 1700 Gross per 24 hour  Intake 2388.32 ml  Output 790 ml  Net 1598.32 ml   Filed Weights   05/21/18 2158  Weight: 56.2 kg    Examination:  General: This is a chronically ill appearing white male currently sedated on the ventilator HEENT normocephalic atraumatic orally intubated Pulmonary: Coarse scattered rhonchi Cardiac: Regular rate and rhythm Abdomen: Soft nontender Extremities: Dry, strong pulses, no edema Neuro:  Heavily sedated not following commands  Resolved Hospital Problem list     Assessment & Plan:  Acute respiratory failure with hypoxia. Plan Cont vap bundle Full vent support  Daily assessment for weaning  repeat cxr in am   Aspiration pneumonia with septic shock and MRSA bacteremia. Also e-coli UTI -off pressors Plan Day 2 vanc and unasyn keep euvolemic at this point  Repeat BCs May need tee   Upper GI bleeding. Plan PPI bid indef  No asa  Am cbc  Acute renal failure from ATN ->improving Plan Cont IVFs Trend chemistry  Strict I&O  NAG metabolic acidosis in setting of NaCl Plan Dc ns Free water replacement via tube F/u am chemistry  Hx of CAD, LV mural thrombus, ischemic CM.  -f/u echo w/ EF 40-45%, no thrombus at this point.  Plan Holding asa given bleed  DM. Plan  ssi  Severe protein calorie malnutrition. Plan Start tubefeeds  Best practice:  Diet: NPO DVT prophylaxis: SCD GI prophylaxis: protonix gtt Mobility: bed rest Code Status: full code Family Communication: updated pt's son at bedside Disposition: admit to ICU   Simonne Martinet ACNP-BC [facility] Pulmonary/Critical Care Pager # [phone] OR # [phone] if no answer

## 2018-05-23 NOTE — Progress Notes (Signed)
  Echocardiogram 2D Echocardiogram has been performed.  Sinda Du 05/23/2018, 1:28 PM

## 2018-05-23 NOTE — Progress Notes (Signed)
eLink Physician-Brief Progress Note Patient Name: Charles Hatfield DOB: 03/28/1941 MRN: [id number]   Date of Service  05/23/2018  HPI/Events of Note  Patient had episode of non-sustained Vtach  eICU Interventions  Ordered BMP Mg as previously low K     Intervention Category Major Interventions: Arrhythmia - evaluation and management Intermediate Interventions: Electrolyte abnormality - evaluation and management  Irving Burton T Altariq Goodall 05/23/2018, 1:00 AM

## 2018-05-24 ENCOUNTER — Inpatient Hospital Stay (HOSPITAL_COMMUNITY): Payer: Medicare Other

## 2018-05-24 ENCOUNTER — Encounter (HOSPITAL_COMMUNITY): Payer: Self-pay | Admitting: Gastroenterology

## 2018-05-24 DIAGNOSIS — G9340 Encephalopathy, unspecified: Secondary | ICD-10-CM

## 2018-05-24 LAB — GLUCOSE, CAPILLARY
Glucose-Capillary: 182 mg/dL — ABNORMAL HIGH (ref 70–99)
Glucose-Capillary: 54 mg/dL — ABNORMAL LOW (ref 70–99)
Glucose-Capillary: 93 mg/dL (ref 70–99)
Glucose-Capillary: 83 mg/dL (ref 70–99)
Glucose-Capillary: 91 mg/dL (ref 70–99)
Glucose-Capillary: 119 mg/dL — ABNORMAL HIGH (ref 70–99)
Glucose-Capillary: 144 mg/dL — ABNORMAL HIGH (ref 70–99)

## 2018-05-24 LAB — COMPREHENSIVE METABOLIC PANEL
ALBUMIN: 1.5 g/dL — AB (ref 3.5–5.0)
ALT: 15 U/L (ref 0–44)
AST: 31 U/L (ref 15–41)
Alkaline Phosphatase: 37 U/L — ABNORMAL LOW (ref 38–126)
Anion gap: 4 — ABNORMAL LOW (ref 5–15)
BUN: 21 mg/dL (ref 8–23)
CO2: 15 mmol/L — ABNORMAL LOW (ref 22–32)
CREATININE: 1.33 mg/dL — AB (ref 0.61–1.24)
Calcium: 7.3 mg/dL — ABNORMAL LOW (ref 8.9–10.3)
Chloride: 120 mmol/L — ABNORMAL HIGH (ref 98–111)
GFR calc Af Amer: 59 mL/min — ABNORMAL LOW (ref >60)
GFR, EST NON AFRICAN AMERICAN: 51 mL/min — AB (ref >60)
GLUCOSE: 203 mg/dL — AB (ref 70–99)
Potassium: 3.2 mmol/L — ABNORMAL LOW (ref 3.5–5.1)
Sodium: 139 mmol/L (ref 135–145)
Total Bilirubin: 0.8 mg/dL (ref 0.3–1.2)
Total Protein: 3.9 g/dL — ABNORMAL LOW (ref 6.5–8.1)

## 2018-05-24 LAB — CBC
HEMATOCRIT: 27.2 % — AB (ref 39.0–52.0)
Hemoglobin: 8.7 g/dL — ABNORMAL LOW (ref 13.0–17.0)
MCH: 33 pg (ref 26.0–34.0)
MCHC: 32 g/dL (ref 30.0–36.0)
MCV: 103 fL — AB (ref 80.0–100.0)
NRBC: 0 % (ref 0.0–0.2)
PLATELETS: 107 10*3/uL — AB (ref 150–400)
RBC: 2.64 MIL/uL — AB (ref 4.22–5.81)
RDW: 14.1 % (ref 11.5–15.5)
WBC: 7.2 10*3/uL (ref 4.0–10.5)

## 2018-05-24 LAB — PHOSPHORUS
Phosphorus: 1.5 mg/dL — ABNORMAL LOW (ref 2.5–4.6)
Phosphorus: 1.7 mg/dL — ABNORMAL LOW (ref 2.5–4.6)

## 2018-05-24 LAB — MAGNESIUM
Magnesium: 1.9 mg/dL (ref 1.7–2.4)
Magnesium: 1.7 mg/dL (ref 1.7–2.4)

## 2018-05-24 MED ORDER — VITAL HIGH PROTEIN PO LIQD: 1000.0000 mL | ORAL | Status: DC

## 2018-05-24 MED ORDER — DEXTROSE 50 % IV SOLN
INTRAVENOUS | Status: AC
  Administered 2018-05-24: 50 mL
  Filled 2018-05-24: qty 50

## 2018-05-24 MED ORDER — POTASSIUM CHLORIDE 10 MEQ/50ML IV SOLN
10.0000 meq | INTRAVENOUS | Status: AC
  Administered 2018-05-24 (×4): 10 meq via INTRAVENOUS
  Filled 2018-05-24 (×4): qty 50

## 2018-05-24 MED ORDER — PANTOPRAZOLE SODIUM 40 MG PO PACK
40.0000 mg | PACK | Freq: Two times a day (BID) | ORAL | Status: DC
  Administered 2018-05-24 – 2018-06-01 (×17): 40 mg
  Filled 2018-05-24 (×18): qty 20

## 2018-05-24 MED ORDER — SODIUM CHLORIDE 0.9 % IV BOLUS
500.0000 mL | Freq: Once | INTRAVENOUS | Status: AC
  Administered 2018-05-24: 500 mL via INTRAVENOUS

## 2018-05-24 MED ORDER — LACTATED RINGERS IV SOLN
INTRAVENOUS | Status: DC
  Administered 2018-05-24 – 2018-05-25 (×2): via INTRAVENOUS

## 2018-05-24 MED ORDER — VITAL AF 1.2 CAL PO LIQD
1000.0000 mL | ORAL | Status: DC
  Administered 2018-05-24: 1000 mL
  Administered 2018-05-26 – 2018-05-27 (×2): 1500 mL
  Administered 2018-05-28 – 2018-06-01 (×2): 1000 mL

## 2018-05-24 MED ORDER — NOREPINEPHRINE 4 MG/250ML-% IV SOLN
0.0000 ug/min | INTRAVENOUS | Status: DC
  Administered 2018-05-24: 5 ug/min via INTRAVENOUS
  Administered 2018-05-25: 6 ug/min via INTRAVENOUS
  Filled 2018-05-24 (×2): qty 250

## 2018-05-24 MED ORDER — SODIUM CHLORIDE 0.9 % IV SOLN
1.5000 g | Freq: Two times a day (BID) | INTRAVENOUS | Status: DC
  Administered 2018-05-24 – 2018-05-25 (×2): 1.5 g via INTRAVENOUS
  Filled 2018-05-24 (×2): qty 1.5

## 2018-05-24 MED ORDER — ADULT MULTIVITAMIN LIQUID CH
15.0000 mL | Freq: Every day | ORAL | Status: DC
  Administered 2018-05-24 – 2018-06-01 (×9): 15 mL via ORAL
  Filled 2018-05-24 (×10): qty 15

## 2018-05-24 MED ORDER — ACETAMINOPHEN 325 MG PO TABS
650.0000 mg | ORAL_TABLET | Freq: Four times a day (QID) | ORAL | Status: DC | PRN
  Administered 2018-05-24 – 2018-05-27 (×4): 650 mg via ORAL
  Filled 2018-05-24 (×4): qty 2

## 2018-05-24 MED ORDER — PRO-STAT SUGAR FREE PO LIQD
30.0000 mL | Freq: Two times a day (BID) | ORAL | Status: DC
  Filled 2018-05-24: qty 30

## 2018-05-24 MED ORDER — VITAMIN B-1 100 MG PO TABS
100.0000 mg | ORAL_TABLET | Freq: Every day | ORAL | Status: DC
  Administered 2018-05-24 – 2018-06-01 (×9): 100 mg via ORAL
  Filled 2018-05-24 (×9): qty 1

## 2018-05-24 NOTE — Progress Notes (Signed)
NAME:  BRONDON BRAZ, MRN:  [id number], DOB:  1941-03-15, LOS: 3 ADMISSION DATE:  05/21/2018, CONSULTATION DATE:  05/21/2018 REFERRING MD:  Donnald Garre, CHIEF COMPLAINT:  Black "coffee ground like" emesis   Brief History   77 yo male brought to ER with altered mental status, vomiting black material, hypotension, hypoxia.  Intubated in ER.  Concern for aspiration pneumonia and upper GI bleeding.  Significant Hospital Events   11/26 - admission and intubation 11/28: Off pressors.  Blood growing gram-positive cocci with prelim suggesting MRSA Consults:  11/27 GI consult  Procedures:  ETT 11/26 >> Upper Endoscopy 11/27  Significant Diagnostic Tests:  CT abd/pelvis 11/26 >> nephrolithiasis. CT chest 11.26 >> bilateral airspace disease R>L Echo 11/26 >> EF is 40 to 45%, inferior and lateral hypokinesis.  No vegetation. CT head 11/26 >> atrophy and old strokes  Micro Data:  Blood 11/26 >> S epidermidis, S. Hominis Urine 11/26 >> E. coli Sputum 11/26 >> pending  Antimicrobials:  Zosyn 11/26 >> 11/27 Unasyn 11/27 >>12/4 Vancomycin 11/27 >>11/29  Interim history/subjective:  Agitated with tachypnea and limb movement on SBT. Has not followed commands despite receiving minimal sedation.   Objective   Blood pressure (!) 106/38, pulse 78, temperature (!) 100.6 F (38.1 C), resp. rate (!) 32, height 5\' 7"  (1.702 m), weight 56.1 kg, SpO2 100 %.    Vent Mode: PRVC FiO2 (%):  [40 %] 40 % Set Rate:  [14 bmp] 14 bmp Vt Set:  [520 mL] 520 mL PEEP:  [5 cmH20] 5 cmH20 Plateau Pressure:  [12 cmH20-19 cmH20] 19 cmH20   Intake/Output Summary (Last 24 hours) at 05/24/2018 1028 Last data filed at 05/24/2018 1001 Gross per 24 hour  Intake 1697.97 ml  Output 800 ml  Net 897.97 ml   Filed Weights   05/21/18 2158 05/24/18 0400  Weight: 56.2 kg 56.1 kg    Examination:  General: This is a chronically ill appearing white male currentlyon the ventilator. Appears malnourished. HEENT  normocephalic atraumatic orally intubated, OGT in place with minimal output. Pulmonary: rhonchi have improved, chest clear. Tolerating SBT 10/5. Cardiac: Regular rate and rhythm extremities warm Abdomen: Soft nontender Extremities: Dry, strong pulses, no edema Neuro: not following commands despite minimal sedation.  Resolved Hospital Problem list     Assessment & Plan:  Acute respiratory failure with hypoxia. Plan Cont vap bundle Full vent support  Daily assessment for weaning  repeat cxr in am   Encephalopathy of unclear etiology - acute illness on background of microvascular disease Minimize sedatives.  Aspiration pneumonia.  Not MRSA bacteremia. Mixed culture of CoNS possible contaminant Also e-coli UTI -off pressors Plan Day 2 vanc and unasyn keep euvolemic at this point  Repeat BCs - if negative can stop antibiotics. May need TEE if bacteremia does not clear promptly.  Upper GI bleeding. Plan PPI bid indefinitely No asa  Am cbc  Acute renal failure from ATN ->improving Plan Cont IVFs Trend chemistry  Strict I&O  NAG metabolic acidosis in setting of NaCl Plan Dc ns Follow electrolytes - will improve as renal function improves.  Hx of CAD, LV mural thrombus, ischemic CM.  -f/u echo w/ EF 40-45%, no thrombus at this point.  Plan Holding asa given bleed  Severe protein calorie malnutrition. Plan Start tube feeds Multivitamin.  Best practice:  Diet: NPO - TF DVT prophylaxis: SCD GI prophylaxis: protonix BID Mobility: bed rest Code Status: full code Family Communication: no recent update. Disposition: a ICU   Lynnell Catalan, MD  [facility] ICU Physician [place]  Pager: [phone] Mobile: [phone] After hours: [phone].

## 2018-05-24 NOTE — Progress Notes (Signed)
EEG Completed; Results Pending  

## 2018-05-24 NOTE — Progress Notes (Signed)
Hypoglycemic Event  CBG: 54 Treatment: D50 IV 50 mL  Symptoms: None  Follow-up CBG: Time:0417 CBG Result:182  Possible Reasons for Event: Unknown  Nthony Lefferts P [place]

## 2018-05-24 NOTE — Progress Notes (Signed)
Nutrition Follow-up  DOCUMENTATION CODES:   Not applicable  INTERVENTION:   Tube Feeding:  Vital AF 1.2 @ 65 ml/hr Begin at 20 ml/hr; if tolerating titrate by 10 mL q 4 hours until goal rate Provides 117 g of protein, 1872 kcals, 1264 mL of free water Meets 100% protein needs, calorie needs  Recommend SLP evaluation post extubation due to difficulty swallowing, pocketing of food  prior to this admission  Monitor magnesium, potassium, and phosphorus daily for at least 4 occurrences (or more if needed) MD to replete as needed, as pt is at risk for refeeding syndrome given poor po intake PTA, malnutrition on exam.  NUTRITION DIAGNOSIS:   Inadequate oral intake related to inability to eat as evidenced by NPO status.  Being addressed via TF   GOAL:   Patient will meet greater than or equal to 90% of their needs  Progressing  MONITOR:   Vent status, Labs, Skin, I & O's  REASON FOR ASSESSMENT:   Ventilator    ASSESSMENT:   77 yo male with PMH of HTN, DM, stroke, COPD, CAD, HLD, ischemic cardiomyopathy who was admitted on 11/26 with AMS, vomiting black material, hypotension, hypoxia. Intubated in the ED.  11/26 Intubated 11/27 EGD: severe erosive esophagitis with no active bleeding  Patient is currently intubated on ventilator support, off levophed MV: 17.3 L/min Temp (24hrs), Avg:100.3 F (37.9 C), Min:99.3 F (37.4 C), Max:100.8 F (38.2 C)  No BM, abdomen soft, BS hypoactive, OG tube in stomach  Pt lives with his Son. Son reports pt typically has very good appetite; eats 3 meals per day, and usually asks for more. Pt's last meal was a plate and half of chicken alfredo on Saturday night 11/23. On Sunday, pt was holding food/pocketing food in mouth and son had to scoop it out. At that time, pt began eating little and only taking 2-3 Regular Ensure per day (440-660 kcals, 18-27 g of protein per day). Pt began vomiting on Wednesday AM and had not had anything since  Tuesday evening. Pt has had no nutrition since 11/26. PO intake </= 50% of estimated needs for >/= 5 days. Son reports UBW around 140-145 pounds and does not believe pt has lost any weight. Current wt 124 pounds. Pt also with some mild edema present so true weight actually lower. Noted weight of 125-127 pounds in January of this year.    Son at bedside and states pt has been unable to walk for several weeks. Pt has bee unsteady on his feet and son has been carrying patient around. Pt has been able to perform other ADLs however  Son also reports they have been residing in a hotel up until recently. They are now living in a rental home.   Pt meets clinical characteristics for severe acute malnutrition based on information provided by son and based on exam. It is likely that pt has some degree of chronic malnutrition given hx as well.  Labs: potassium 3.2, Creatinine 1.33, BUN wdl, CBGs 54-182 Meds: liquid MVI, KCl, thiamine   NUTRITION - FOCUSED PHYSICAL EXAM: Of note, the severity of LE wasting likely related somewhat to pt's immobile status for the weeks leading up to this admission    Most Recent Value  Orbital Region  Moderate depletion  Upper Arm Region  Mild depletion  Thoracic and Lumbar Region  Mild depletion  Buccal Region  Moderate depletion  Temple Region  Moderate depletion  Clavicle Bone Region  Moderate depletion  Clavicle and Acromion  Bone Region  Moderate depletion  Scapular Bone Region  Mild depletion  Dorsal Hand  Unable to assess  Patellar Region  Severe depletion  Anterior Thigh Region  Severe depletion  Posterior Calf Region  Severe depletion  Edema (RD Assessment)  Mild [B/L UE]  Hair  Reviewed  Eyes  Unable to assess  Mouth  Unable to assess  Skin  Reviewed  Nails  Unable to assess       Diet Order:   Diet Order            Diet NPO time specified  Diet effective now              EDUCATION NEEDS:   No education needs have been identified at this  time  Skin:  Skin Assessment: Skin Integrity Issues: Skin Integrity Issues:: DTI DTI: R heel  Last BM:  no documented BM  Height:   Ht Readings from Last 1 Encounters:  05/21/18 5\' 7"  (1.702 m)    Weight:   Wt Readings from Last 1 Encounters:  05/24/18 56.1 kg    Ideal Body Weight:  67.3 kg  BMI:  Body mass index is 19.37 kg/m.  Estimated Nutritional Needs:   Kcal:  1887 kcals   Protein:  90-115 g  Fluid:  1.8 L  Romelle Starcher MS, RD, LDN, CNSC [phone] Pager  [phone] Weekend/On-Call Pager

## 2018-05-24 NOTE — Procedures (Signed)
History: 77 year old male being evaluated for depressed mental status  Sedation: None  Technique: This is a 21 channel routine scalp EEG performed at the bedside with bipolar and monopolar montages arranged in accordance to the international 10/20 system of electrode placement. One channel was dedicated to EKG recording.    Background: The background is asymmetric with attenuation of faster frequencies throughout the right hemisphere.  There is a posterior dominant rhythm on the left which achieves a frequency of 7 Hz.  In addition there is irregular delta activity throughout the left hemisphere maximal in the left temporal region.  There are occasional sharp waves at T7 >F7.  Photic stimulation: Physiologic driving is not performed  EEG Abnormalities: 1) left temporal sharp waves 2) left irregular focal delta activity 3) attenuation of faster frequencies on the right.  Clinical Interpretation: This EEG is suggestive of a focal area of epileptogenicity in the left temporal region.  There is also suggestion of a cortical dysfunction in the right hemisphere and correlation with MRI imaging is suggested.   The prominence of irregular delta activity in the left hemisphere could represent a focal dysfunction of this area as would be supported by the presence of sharp waves, but also could represent a more generalized pattern which is not seen due to the cortical dysfunction suggested on the right.  There was no seizure recorded on this study.   Ritta Slot, MD [facility] [phone]  If 7pm- 7am, please page neurology on call as listed in AMION.

## 2018-05-24 NOTE — Progress Notes (Signed)
[facility] ADULT ICU REPLACEMENT PROTOCOL FOR AM LAB REPLACEMENT ONLY  The patient does apply for the [facility] Adult ICU Electrolyte Replacment Protocol based on the criteria listed below:   1. Is GFR >/= 40 ml/min? Yes.    Patient's GFR today is 51 2. Is urine output >/= 0.5 ml/kg/hr for the last 6 hours? Yes.   Patient's UOP is 0.8 ml/kg/hr 3. Is BUN < 60 mg/dL? Yes.    Patient's BUN today is 21 4. Abnormal electrolyte(s): K 3.2 5. Ordered repletion with: protocol 6. If a panic level lab has been reported, has the CCM MD in charge been notified? No..   Physician:    Markus Daft A 05/24/2018 6:52 AM

## 2018-05-25 ENCOUNTER — Inpatient Hospital Stay (HOSPITAL_COMMUNITY): Payer: Medicare Other

## 2018-05-25 DIAGNOSIS — E43 Unspecified severe protein-calorie malnutrition: Secondary | ICD-10-CM

## 2018-05-25 LAB — BASIC METABOLIC PANEL
ANION GAP: 8 (ref 5–15)
BUN: 18 mg/dL (ref 8–23)
CHLORIDE: 117 mmol/L — AB (ref 98–111)
CO2: 17 mmol/L — ABNORMAL LOW (ref 22–32)
Calcium: 7.1 mg/dL — ABNORMAL LOW (ref 8.9–10.3)
Creatinine, Ser: 1.11 mg/dL (ref 0.61–1.24)
GFR calc Af Amer: >60 mL/min (ref >60)
GFR calc non Af Amer: >60 mL/min (ref >60)
Glucose, Bld: 169 mg/dL — ABNORMAL HIGH (ref 70–99)
POTASSIUM: 3.3 mmol/L — AB (ref 3.5–5.1)
Sodium: 142 mmol/L (ref 135–145)

## 2018-05-25 LAB — CBC
HCT: 27.7 % — ABNORMAL LOW (ref 39.0–52.0)
Hemoglobin: 8.9 g/dL — ABNORMAL LOW (ref 13.0–17.0)
MCH: 33 pg (ref 26.0–34.0)
MCHC: 32.1 g/dL (ref 30.0–36.0)
MCV: 102.6 fL — ABNORMAL HIGH (ref 80.0–100.0)
Platelets: 120 10*3/uL — ABNORMAL LOW (ref 150–400)
RBC: 2.7 MIL/uL — ABNORMAL LOW (ref 4.22–5.81)
RDW: 14.5 % (ref 11.5–15.5)
WBC: 12.3 10*3/uL — AB (ref 4.0–10.5)
nRBC: 0.2 % (ref 0.0–0.2)

## 2018-05-25 LAB — GLUCOSE, CAPILLARY
GLUCOSE-CAPILLARY: 112 mg/dL — AB (ref 70–99)
Glucose-Capillary: 126 mg/dL — ABNORMAL HIGH (ref 70–99)
Glucose-Capillary: 131 mg/dL — ABNORMAL HIGH (ref 70–99)
Glucose-Capillary: 143 mg/dL — ABNORMAL HIGH (ref 70–99)
Glucose-Capillary: 180 mg/dL — ABNORMAL HIGH (ref 70–99)
Glucose-Capillary: 181 mg/dL — ABNORMAL HIGH (ref 70–99)

## 2018-05-25 LAB — MAGNESIUM
Magnesium: 2 mg/dL (ref 1.7–2.4)
Magnesium: 1.7 mg/dL (ref 1.7–2.4)

## 2018-05-25 LAB — PHOSPHORUS
PHOSPHORUS: 3.4 mg/dL (ref 2.5–4.6)
Phosphorus: 1.6 mg/dL — ABNORMAL LOW (ref 2.5–4.6)

## 2018-05-25 MED ORDER — POTASSIUM PHOSPHATES 15 MMOLE/5ML IV SOLN
30.0000 mmol | Freq: Once | INTRAVENOUS | Status: AC
  Administered 2018-05-25: 30 mmol via INTRAVENOUS
  Filled 2018-05-25: qty 10

## 2018-05-25 MED ORDER — FREE WATER
200.0000 mL | Status: DC
  Administered 2018-05-25 – 2018-06-01 (×33): 200 mL

## 2018-05-25 MED ORDER — POTASSIUM CHLORIDE 10 MEQ/50ML IV SOLN
10.0000 meq | INTRAVENOUS | Status: AC
  Administered 2018-05-25 (×2): 10 meq via INTRAVENOUS
  Filled 2018-05-25 (×2): qty 50

## 2018-05-25 MED ORDER — SODIUM CHLORIDE 0.9 % IV SOLN
3.0000 g | Freq: Three times a day (TID) | INTRAVENOUS | Status: AC
  Administered 2018-05-25 – 2018-05-29 (×13): 3 g via INTRAVENOUS
  Filled 2018-05-25 (×14): qty 3

## 2018-05-25 MED ORDER — CHLORHEXIDINE GLUCONATE 0.12 % MT SOLN
OROMUCOSAL | Status: AC
  Filled 2018-05-25: qty 15

## 2018-05-25 MED ORDER — MIDAZOLAM HCL 2 MG/2ML IJ SOLN
1.0000 mg | INTRAMUSCULAR | Status: DC | PRN
  Administered 2018-05-26: 1 mg via INTRAVENOUS
  Filled 2018-05-25: qty 2

## 2018-05-25 MED ORDER — FENTANYL CITRATE (PF) 100 MCG/2ML IJ SOLN
50.0000 ug | INTRAMUSCULAR | Status: DC | PRN
  Administered 2018-05-25 – 2018-05-26 (×3): 50 ug via INTRAVENOUS
  Filled 2018-05-25 (×4): qty 2

## 2018-05-25 NOTE — Progress Notes (Signed)
[facility] ADULT ICU REPLACEMENT PROTOCOL FOR AM LAB REPLACEMENT ONLY  The patient does apply for the [facility] Adult ICU Electrolyte Replacment Protocol based on the criteria listed below:   1. Is GFR >/= 40 ml/min? Yes.    Patient's GFR today is >60 2. Is urine output >/= 0.5 ml/kg/hr for the last 6 hours? Yes.   Patient's UOP is 0.6 ml/kg/hr 3. Is BUN < 60 mg/dL? Yes.    Patient's BUN today is 21 4. Abnormal electrolyte(s): k 3.3 5. Ordered repletion with: protocol 6. If a panic level lab has been reported, has the CCM MD in charge been notified? No..   Physician:    Markus Daft A 05/25/2018 6:22 AM

## 2018-05-25 NOTE — Progress Notes (Addendum)
NAME:  Charles Hatfield, MRN:  [id number], DOB:  April 03, 1941, LOS: 4 ADMISSION DATE:  05/21/2018, CONSULTATION DATE:  05/21/2018 REFERRING MD:  Donnald Garre, CHIEF COMPLAINT:  Black "coffee ground like" emesis   Brief History   77 yo male brought to ER with altered mental status, vomiting black material, hypotension, hypoxia.  Intubated in ER.  Concern for aspiration pneumonia and upper GI bleeding.  Significant Hospital Events   11/26 - admission and intubation 11/28: Off pressors.  Blood growing gram-positive cocci with prelim suggesting MRSA 11/29 cultures neg for MRSA. Vanc stopped.  11/30: passing SBT. Discussed high risk for re-intubation w/ son.  Cont abx. Will need NG small bore tube, failed SBT after prolonged trial  Consults:  11/27 GI consult  Procedures:  ETT 11/26 >> Upper Endoscopy 11/27  Significant Diagnostic Tests:  CT abd/pelvis 11/26 >> nephrolithiasis. CT chest 11.26 >> bilateral airspace disease R>L Echo 11/26 >> EF is 40 to 45%, inferior and lateral hypokinesis.  No vegetation. CT head 11/26 >> atrophy and old strokes  Micro Data:  Blood 11/26 >> S epidermidis, S. Hominis Urine 11/26 >> E. coli Sputum 11/26 >> pending  Antimicrobials:  Zosyn 11/26 >> 11/27 Unasyn 11/27 >>12/4 Vancomycin 11/27 >>11/29  Interim history/subjective:  Following commands No distress VTs acceptable but he is weak  Objective   Blood pressure (Abnormal) 106/47, pulse 68, temperature (Abnormal) 100.4 F (38 C), resp. rate (Abnormal) 23, height 5\' 7"  (1.702 m), weight 62.8 kg, SpO2 100 %.    Vent Mode: CPAP;PSV FiO2 (%):  [40 %] 40 % Set Rate:  [14 bmp] 14 bmp Vt Set:  [520 mL] 520 mL PEEP:  [5 cmH20] 5 cmH20 Pressure Support:  [10 cmH20] 10 cmH20 Plateau Pressure:  [12 cmH20-16 cmH20] 14 cmH20   Intake/Output Summary (Last 24 hours) at 05/25/2018 1205 Last data filed at 05/25/2018 1100 Gross per 24 hour  Intake 3271.59 ml  Output 749 ml  Net 2522.59 ml   Filed  Weights   05/21/18 2158 05/24/18 0400 05/25/18 0500  Weight: 56.2 kg 56.1 kg 62.8 kg    Examination: General: 77 year old white male currently on pressure support ventilation and in no acute distress however he is profoundly weak HEENT normocephalic mucous membranes are dry orally intubated no neck vein distention Pulmonary: Diminished, scattered rhonchi.  After VT acceptable on spontaneous breathing trial next line cardiac: Regular rate and rhythm Abdomen: Soft nontender Extremities: Diffuse anasarca weeping upper extremities scattered areas of ecchymosis Neuro: Awake, follows commands, profoundly weak.  Moves all extremities. GU: Clear yellow.  Resolved Hospital Problem list     Assessment & Plan:  Acute respiratory failure with hypoxia. Portable chest x-ray personally reviewed this shows endotracheal tubes in satisfactory position.  There is improved aeration on the right side Ventilator mechanics favorable for extubation. Plan Cont full vent support Attempt SBT in am (anticipate extubation) Wean oxygen NPO Pulse ox  If fails will likely need trach (I discussed this w/ his son via phone)   Encephalopathy of unclear etiology - acute illness on background of microvascular disease -He is profoundly deconditioned Plan Discontinue sedating medications Physical therapy Range of motion  Aspiration pneumonia.  Not MRSA bacteremia. Mixed culture of CoNS possible contaminant Also e-coli UTI -off pressors Plan Antibiotic day number 5.  Will complete total of 8 days, currently on monotherapy with Unasyn  Repeat blood cultures  Upper GI bleeding. Plan Continue PPI indefinitely No NSAIDs  Acute renal failure from ATN ->improving Plan Cont IVFs Trend  chemistry  Strict I&O  NAG metabolic acidosis in setting of NaCl ->improving  Plan Continuing free water replacement Saline discontinued Repeat a.m. Chemistry  Fluid and electrolyte imbalance: hypophosphatemia, and  hypokalemia  Plan Replace and recheck  Hx of CAD, LV mural thrombus, ischemic CM.  -f/u echo w/ EF 40-45%, no thrombus at this point.  Plan Holding asa  Cont tele   Severe protein calorie malnutrition. Plan Npo Will need small bore FT for nutrition.   Best practice:  Diet: NPO - TF DVT prophylaxis: SCD GI prophylaxis: protonix BID Mobility: bed rest Code Status: full code Family Communication: no recent update. Disposition:  He remains critically ill.  He is actively weaning.  He is ready for extubation however I fear his deconditioning, malnutrition, and overall poor health may necessitate reintubation.  Had a long discussion with his son Casimiro Needle who understands the current situation.  He remains a full code.  At this point we will proceed with extubation, if reintubated he will likely need tracheostomy

## 2018-05-25 NOTE — Progress Notes (Signed)
CRITICAL VALUE ALERT  Critical Value:  K+ 3.3  Date & Time Notied:  05/25/18 555  Provider Notified: Pola Corn  Orders Received/Actions taken: see orders/mar

## 2018-05-26 DIAGNOSIS — J189 Pneumonia, unspecified organism: Secondary | ICD-10-CM

## 2018-05-26 DIAGNOSIS — Z515 Encounter for palliative care: Secondary | ICD-10-CM

## 2018-05-26 DIAGNOSIS — E43 Unspecified severe protein-calorie malnutrition: Secondary | ICD-10-CM

## 2018-05-26 LAB — BASIC METABOLIC PANEL
Anion gap: 9 (ref 5–15)
BUN: 19 mg/dL (ref 8–23)
CHLORIDE: 111 mmol/L (ref 98–111)
CO2: 20 mmol/L — ABNORMAL LOW (ref 22–32)
Calcium: 7.1 mg/dL — ABNORMAL LOW (ref 8.9–10.3)
Creatinine, Ser: 1.05 mg/dL (ref 0.61–1.24)
GFR calc Af Amer: >60 mL/min (ref >60)
GFR calc non Af Amer: >60 mL/min (ref >60)
Glucose, Bld: 126 mg/dL — ABNORMAL HIGH (ref 70–99)
Potassium: 3.7 mmol/L (ref 3.5–5.1)
Sodium: 140 mmol/L (ref 135–145)

## 2018-05-26 LAB — GLUCOSE, CAPILLARY
Glucose-Capillary: 142 mg/dL — ABNORMAL HIGH (ref 70–99)
Glucose-Capillary: 113 mg/dL — ABNORMAL HIGH (ref 70–99)
Glucose-Capillary: 140 mg/dL — ABNORMAL HIGH (ref 70–99)
Glucose-Capillary: 130 mg/dL — ABNORMAL HIGH (ref 70–99)
Glucose-Capillary: 155 mg/dL — ABNORMAL HIGH (ref 70–99)

## 2018-05-26 LAB — CBC
HCT: 26.7 % — ABNORMAL LOW (ref 39.0–52.0)
Hemoglobin: 8.6 g/dL — ABNORMAL LOW (ref 13.0–17.0)
MCH: 32.8 pg (ref 26.0–34.0)
MCHC: 32.2 g/dL (ref 30.0–36.0)
MCV: 101.9 fL — AB (ref 80.0–100.0)
Platelets: 121 10*3/uL — ABNORMAL LOW (ref 150–400)
RBC: 2.62 MIL/uL — ABNORMAL LOW (ref 4.22–5.81)
RDW: 14.6 % (ref 11.5–15.5)
WBC: 9.3 10*3/uL (ref 4.0–10.5)
nRBC: 0.2 % (ref 0.0–0.2)

## 2018-05-26 MED ORDER — FUROSEMIDE 10 MG/ML IJ SOLN
40.0000 mg | Freq: Three times a day (TID) | INTRAMUSCULAR | Status: DC
  Administered 2018-05-26 – 2018-05-28 (×8): 40 mg via INTRAVENOUS
  Filled 2018-05-26 (×8): qty 4

## 2018-05-26 MED ORDER — POTASSIUM CHLORIDE 20 MEQ/15ML (10%) PO SOLN
40.0000 meq | ORAL | Status: AC
  Administered 2018-05-26 (×2): 40 meq
  Filled 2018-05-26 (×2): qty 30

## 2018-05-26 NOTE — Progress Notes (Addendum)
NAME:  Charles Hatfield, MRN:  [id number], DOB:  Sep 23, 1940, LOS: 5 ADMISSION DATE:  05/21/2018, CONSULTATION DATE:  05/21/2018 REFERRING MD:  Donnald Garre, CHIEF COMPLAINT:  Black "coffee ground like" emesis   Brief History   76 yo male brought to ER with altered mental status, vomiting black material, hypotension, hypoxia.  Intubated in ER.  Concern for aspiration pneumonia and upper GI bleeding.  Significant Hospital Events   11/26 - admission and intubation 11/28: Off pressors.  Blood growing gram-positive cocci with prelim suggesting MRSA 11/29 cultures neg for MRSA. Vanc stopped.  11/30: passing SBT. Discussed high risk for re-intubation w/ son.  Cont abx. Will need NG small bore tube, failed SBT after prolonged trial  12/1: Failed spontaneous breathing trial profoundly weak long discussion with son.  Deciding now on one-way extubation in the next 24-48 hrs., with plan to likely transition to comfort if indicated. Consults:  11/27 GI consult  Procedures:  ETT 11/26 >> Upper Endoscopy 11/27  Significant Diagnostic Tests:  CT abd/pelvis 11/26 >> nephrolithiasis. CT chest 11.26 >> bilateral airspace disease R>L Echo 11/26 >> EF is 40 to 45%, inferior and lateral hypokinesis.  No vegetation. CT head 11/26 >> atrophy and old strokes  Micro Data:  Blood 11/26 >> S epidermidis, S. Hominis Urine 11/26 >> E. coli Sputum 11/26 >> pending  Antimicrobials:  Zosyn 11/26 >> 11/27 Unasyn 11/27 >>12/4 Vancomycin 11/27 >>11/29  Interim history/subjective:  He failed spontaneous breathing trial today  Objective   Blood pressure (Abnormal) 105/44, pulse 84, temperature (Abnormal) 100.4 F (38 C), resp. rate (Abnormal) 24, height 5\' 7"  (1.702 m), weight 62.8 kg, SpO2 100 %.    Vent Mode: PRVC FiO2 (%):  [40 %] 40 % Set Rate:  [14 bmp] 14 bmp Vt Set:  [520 mL] 520 mL PEEP:  [5 cmH20] 5 cmH20 Pressure Support:  [10 cmH20] 10 cmH20 Plateau Pressure:  [11 cmH20-24 cmH20] 11 cmH20    Intake/Output Summary (Last 24 hours) at 05/26/2018 0941 Last data filed at 05/26/2018 0800 Gross per 24 hour  Intake 2832.11 ml  Output 810 ml  Net 2022.11 ml   Filed Weights   05/21/18 2158 05/24/18 0400 05/25/18 0500  Weight: 56.2 kg 56.1 kg 62.8 kg    Examination: General: This is a profoundly weak 77 year old male he remains on full ventilator support. HEENT normocephalic atraumatic no jugular venous distention orally intubated mucous membranes dry. Pulmonary: Coarse scattered rhonchi, becomes tachypneic with any level of pressure support.  Diminished throughout.   cardiac: Regular rate and rhythm, occasional PVCs Extremities: Diffuse anasarca with left upper extremity swelling noted his extremities are weeping with scattered areas of ecchymosis Abdomen: Soft nontender no organomegaly GU: Clear yellow.  Resolved Hospital Problem list   Non-anion gap metabolic acidosis secondary to hyperchloremia  Assessment & Plan:  Acute respiratory failure with hypoxia with failure to wean in the setting of profound deconditioning and malnutrition Failed spontaneous breathing trial this a.m. Plan Aggressive diuresis Full ventilator support today with no weaning PAD protocol RASS goal 0 Antibiotics per below VAP bundle Planning on one-way extubation either the second or third depending on when his son can clear his schedule to be present   Encephalopathy of unclear etiology - acute illness on background of microvascular disease -He is profoundly deconditioned Plan Supportive care  Aspiration pneumonia.  Not MRSA bacteremia. Mixed culture of CoNS possible contaminant Also e-coli UTI -off pressors Plan Continuing Unasyn, now day #6 of 8 total therapy  Repeating blood  cultures  Upper GI bleeding. Plan PPI indefinitely  Acute renal failure from ATN ->improving Plan IV fluids at [facility] A.m. chemistry  Intermittent fluid and electrolyte imbalance  Plan Repeat a.m.  Chemistry  Hx of CAD, LV mural thrombus, ischemic CM.  -f/u echo w/ EF 40-45%, no thrombus at this point.  Plan Holding anticoagulation Continuing telemetry  Severe protein calorie malnutrition. Plan Continuing tube feeds  Best practice:  Diet: NPO - TF DVT prophylaxis: SCD GI prophylaxis: protonix BID Mobility: bed rest Code Status: DO NOT RESUSCITATE Family Communication: no recent update. Disposition:  Long discussion with his son Charles Hatfield.  Mr. Cosman has not improved over the course of his hospitalization and 1 day of weaning has resulted in significant setback.  I do not think tracheostomy will afford [place] the goals that we had hoped, discussed prior the hope for tracheostomy and perhaps skilled nursing facility for a month or so and then home with son.  I do not think that this is a reasonable expectation given his degree of deconditioning and malnourished state.  Based on the patient's wishes as well as keeping in line with his values we have decided to proceed with one-way extubation in the next 24 to 48 hours.  His son is currently trying to clear his schedule so that he can be present during this time.  He understands that we may quickly need to transition to comfort based care  My critical care x43 minutes  Simonne Martinet ACNP-BC [facility] Pulmonary/Critical Care Pager # [phone] OR # [phone] if no answer  Attending Note:  77 year old male with severe deconditioning who presents with pneumonia.  The patient has failed to improve over the course of his ICU stay.  On exam, he is emaciated and severely deconditioning with coarse BS diffusely.  I reviewed CXR myself, infiltrate noted.  Discussed with PCCM-NP.  Per previous discussion, the patient did not want trach/peg/SNF.  As per discussion above, will hold weaning at this point and proceed with one way extubation in the next 24-48 hours and full DNR status.  Continue abx for now.  F/U on cultures.  Continue nutrition as ordered.   PCCM will continue to follow.  The patient is critically ill with multiple organ systems failure and requires high complexity decision making for assessment and support, frequent evaluation and titration of therapies, application of advanced monitoring technologies and extensive interpretation of multiple databases.   Critical Care Time devoted to patient care services described in this note is  32  Minutes. This time reflects time of care of this signee Dr Koren Bound. This critical care time does not reflect procedure time, or teaching time or supervisory time of PA/NP/Med student/Med Resident etc but could involve care discussion time.  Alyson Reedy, M.D. [facility] Pulmonary/Critical Care Medicine. Pager: [phone]. After hours pager: [phone].

## 2018-05-27 ENCOUNTER — Inpatient Hospital Stay (HOSPITAL_COMMUNITY): Payer: Medicare Other

## 2018-05-27 DIAGNOSIS — Z7189 Other specified counseling: Secondary | ICD-10-CM

## 2018-05-27 LAB — CULTURE, BLOOD (ROUTINE X 2): SPECIAL REQUESTS: ADEQUATE

## 2018-05-27 LAB — COMPREHENSIVE METABOLIC PANEL
ALT: 68 U/L — ABNORMAL HIGH (ref 0–44)
ANION GAP: 10 (ref 5–15)
AST: 111 U/L — ABNORMAL HIGH (ref 15–41)
Albumin: 1.5 g/dL — ABNORMAL LOW (ref 3.5–5.0)
Alkaline Phosphatase: 75 U/L (ref 38–126)
BUN: 21 mg/dL (ref 8–23)
CO2: 24 mmol/L (ref 22–32)
Calcium: 7.3 mg/dL — ABNORMAL LOW (ref 8.9–10.3)
Chloride: 104 mmol/L (ref 98–111)
Creatinine, Ser: 1 mg/dL (ref 0.61–1.24)
GFR calc Af Amer: >60 mL/min (ref >60)
GFR calc non Af Amer: >60 mL/min (ref >60)
Glucose, Bld: 152 mg/dL — ABNORMAL HIGH (ref 70–99)
Potassium: 4 mmol/L (ref 3.5–5.1)
Sodium: 138 mmol/L (ref 135–145)
Total Bilirubin: 0.4 mg/dL (ref 0.3–1.2)
Total Protein: 4.6 g/dL — ABNORMAL LOW (ref 6.5–8.1)

## 2018-05-27 LAB — CBC
HCT: 27.9 % — ABNORMAL LOW (ref 39.0–52.0)
Hemoglobin: 8.9 g/dL — ABNORMAL LOW (ref 13.0–17.0)
MCH: 32.1 pg (ref 26.0–34.0)
MCHC: 31.9 g/dL (ref 30.0–36.0)
MCV: 100.7 fL — AB (ref 80.0–100.0)
Platelets: 159 10*3/uL (ref 150–400)
RBC: 2.77 MIL/uL — ABNORMAL LOW (ref 4.22–5.81)
RDW: 14.2 % (ref 11.5–15.5)
WBC: 11.3 10*3/uL — ABNORMAL HIGH (ref 4.0–10.5)
nRBC: 0.3 % — ABNORMAL HIGH (ref 0.0–0.2)

## 2018-05-27 LAB — GLUCOSE, CAPILLARY
GLUCOSE-CAPILLARY: 132 mg/dL — AB (ref 70–99)
Glucose-Capillary: 105 mg/dL — ABNORMAL HIGH (ref 70–99)
Glucose-Capillary: 135 mg/dL — ABNORMAL HIGH (ref 70–99)
Glucose-Capillary: 145 mg/dL — ABNORMAL HIGH (ref 70–99)
Glucose-Capillary: 155 mg/dL — ABNORMAL HIGH (ref 70–99)
Glucose-Capillary: 103 mg/dL — ABNORMAL HIGH (ref 70–99)
Glucose-Capillary: 113 mg/dL — ABNORMAL HIGH (ref 70–99)

## 2018-05-27 NOTE — Progress Notes (Addendum)
NAME:  Charles Hatfield, MRN:  [id number], DOB:  [birth year], LOS: 6 ADMISSION DATE:  05/21/2018, CONSULTATION DATE:  05/21/2018 REFERRING MD:  Donnald Garre, CHIEF COMPLAINT:  Black "coffee ground like" emesis   Brief History   77 yo male brought to ER with altered mental status, vomiting black material, hypotension, hypoxia.  Intubated in ER.  Concern for aspiration pneumonia and upper GI bleeding.  Significant Hospital Events   11/26 - admission and intubation 11/28: Off pressors.  Blood growing gram-positive cocci with prelim suggesting MRSA 11/29 cultures neg for MRSA. Vanc stopped.  11/30: passing SBT. Discussed high risk for re-intubation w/ son.  Cont abx. Will need NG small bore tube, failed SBT after prolonged trial  12/1: Failed spontaneous breathing trial profoundly weak long discussion with son.  Deciding now on one-way extubation in the next 24-48 hrs., with plan to likely transition to comfort if indicated. 12/2: continues to fail weaning trials due to profound weakness and deconditioning  Consults:  11/27 GI consult  Procedures:  ETT 11/26>>> Upper Endoscopy 11/27>>>  Significant Diagnostic Tests:  CT abd/pelvis 11/26 >> nephrolithiasis. CT chest 11.26 >> bilateral airspace disease R>L Echo 11/26 >> EF is 40 to 45%, inferior and lateral hypokinesis.  No vegetation. CT head 11/26 >> atrophy and old strokes  Micro Data:  Blood 11/26 >> S epidermidis, S. Hominis Urine 11/26 >> E. coli Sputum 11/26 >> pending  Antimicrobials:  Zosyn 11/26 >> 11/27 Unasyn 11/27 >>12/4 Vancomycin 11/27 >>11/29  Interim history/subjective:  Failed spontaneous breathing trial this AM with high rate and low Tv  Objective   Blood pressure (!) 113/42, pulse 75, temperature 100 F (37.8 C), temperature source Core, resp. rate (!) 28, height 5\' 7"  (1.702 m), weight 62.8 kg, SpO2 100 %.    Vent Mode: PRVC FiO2 (%):  [40 %] 40 % Set Rate:  [14 bmp] 14 bmp Vt Set:  [520 mL] 520 mL PEEP:   [5 cmH20] 5 cmH20 Plateau Pressure:  [9 cmH20-20 cmH20] 20 cmH20   Intake/Output Summary (Last 24 hours) at 05/27/2018 1032 Last data filed at 05/27/2018 0800 Gross per 24 hour  Intake 2084.86 ml  Output 4225 ml  Net -2140.14 ml   Filed Weights   05/24/18 0400 05/25/18 0500 05/27/18 0500  Weight: 56.1 kg 62.8 kg 62.8 kg    Examination: General: Emaciated 77 year old male, on the vent, not following commands, NAD when on full support HEENT: [place]/AT, PERRL, EOM-I and MMM Pulmonary: Decreased BS throughout all lung fields Cardiac: RRR, Nl S1/S2 and -M/R/G Extremities: Diffuse anasarca with left upper extremity swelling noted his extremities are weeping with scattered areas of ecchymosis Abdomen: Soft, NT, ND and +BS GU: Clear yellow. Skin: multiple echimotic spots and multiple puncture sites  Resolved Hospital Problem list   Non-anion gap metabolic acidosis secondary to hyperchloremia  Assessment & Plan:  Acute respiratory failure with hypoxia with failure to wean in the setting of profound deconditioning and malnutrition Failed spontaneous breathing trial this a.m. Plan Hold further diureses at this point Full vent support Son to arrive today for discussion regarding one way extubation, will recommend transition to full comfort PAD protocol RASS goal 0 Continue abx for now, will likely d/c once transitioned to comfort VAP bundle Awaiting arrival of the son for further discussions   Encephalopathy of unclear etiology - acute illness on background of microvascular disease -He is profoundly deconditioned Plan Supportive care  Aspiration pneumonia.  Not MRSA bacteremia. Mixed culture of CoNS possible  contaminant Also e-coli UTI -off pressors Plan Continuing Unasyn, now day #7 of 8 total therapy  Repeating blood cultures D/C pressors, no further escalation of care  Upper GI bleeding. Plan PPI indefinitely  Acute renal failure from ATN ->improving Plan IV fluids at  [facility] further blood draws for now  Intermittent fluid and electrolyte imbalance  Plan Hold further blood draws for now  Hx of CAD, LV mural thrombus, ischemic CM.  -f/u echo w/ EF 40-45%, no thrombus at this point.  Plan Holding anticoagulation Continuing telemetry  Severe protein calorie malnutrition. Plan Hold TF  Best practice:  Diet: NPO - TF DVT prophylaxis: SCD GI prophylaxis: protonix BID Mobility: bed rest Code Status: DO NOT RESUSCITATE Family Communication: no recent update. Disposition:  Son to arrive today for discussion regarding plan of care, PCCM will continue to follow.  The patient is critically ill with multiple organ systems failure and requires high complexity decision making for assessment and support, frequent evaluation and titration of therapies, application of advanced monitoring technologies and extensive interpretation of multiple databases.   Critical Care Time devoted to patient care services described in this note is  45  Minutes. This time reflects time of care of this signee Dr Koren Bound. This critical care time does not reflect procedure time, or teaching time or supervisory time of PA/NP/Med student/Med Resident etc but could involve care discussion time.  Alyson Reedy, M.D. [facility] Pulmonary/Critical Care Medicine. Pager: [phone]. After hours pager: [phone].

## 2018-05-28 DIAGNOSIS — J690 Pneumonitis due to inhalation of food and vomit: Secondary | ICD-10-CM

## 2018-05-28 LAB — COMPREHENSIVE METABOLIC PANEL
ALT: 58 U/L — ABNORMAL HIGH (ref 0–44)
AST: 86 U/L — ABNORMAL HIGH (ref 15–41)
Albumin: 1.6 g/dL — ABNORMAL LOW (ref 3.5–5.0)
Alkaline Phosphatase: 59 U/L (ref 38–126)
Anion gap: 11 (ref 5–15)
BUN: 22 mg/dL (ref 8–23)
CO2: 26 mmol/L (ref 22–32)
Calcium: 7.5 mg/dL — ABNORMAL LOW (ref 8.9–10.3)
Chloride: 101 mmol/L (ref 98–111)
Creatinine, Ser: 1.27 mg/dL — ABNORMAL HIGH (ref 0.61–1.24)
GFR calc Af Amer: >60 mL/min (ref >60)
GFR calc non Af Amer: 54 mL/min — ABNORMAL LOW (ref >60)
Glucose, Bld: 100 mg/dL — ABNORMAL HIGH (ref 70–99)
Potassium: 3.6 mmol/L (ref 3.5–5.1)
Sodium: 138 mmol/L (ref 135–145)
Total Bilirubin: 0.6 mg/dL (ref 0.3–1.2)
Total Protein: 4.6 g/dL — ABNORMAL LOW (ref 6.5–8.1)

## 2018-05-28 LAB — PHOSPHORUS: Phosphorus: 3.8 mg/dL (ref 2.5–4.6)

## 2018-05-28 LAB — GLUCOSE, CAPILLARY
Glucose-Capillary: 102 mg/dL — ABNORMAL HIGH (ref 70–99)
Glucose-Capillary: 107 mg/dL — ABNORMAL HIGH (ref 70–99)
Glucose-Capillary: 112 mg/dL — ABNORMAL HIGH (ref 70–99)
Glucose-Capillary: 114 mg/dL — ABNORMAL HIGH (ref 70–99)
Glucose-Capillary: 120 mg/dL — ABNORMAL HIGH (ref 70–99)
Glucose-Capillary: 90 mg/dL (ref 70–99)
Glucose-Capillary: 96 mg/dL (ref 70–99)

## 2018-05-28 LAB — CBC
HCT: 27.4 % — ABNORMAL LOW (ref 39.0–52.0)
Hemoglobin: 8.7 g/dL — ABNORMAL LOW (ref 13.0–17.0)
MCH: 32.6 pg (ref 26.0–34.0)
MCHC: 31.8 g/dL (ref 30.0–36.0)
MCV: 102.6 fL — AB (ref 80.0–100.0)
PLATELETS: 221 10*3/uL (ref 150–400)
RBC: 2.67 MIL/uL — ABNORMAL LOW (ref 4.22–5.81)
RDW: 14.3 % (ref 11.5–15.5)
WBC: 11.7 10*3/uL — ABNORMAL HIGH (ref 4.0–10.5)
nRBC: 0 % (ref 0.0–0.2)

## 2018-05-28 LAB — MAGNESIUM: Magnesium: 1.9 mg/dL (ref 1.7–2.4)

## 2018-05-28 MED ORDER — ACETAMINOPHEN 160 MG/5ML PO SOLN
650.0000 mg | Freq: Four times a day (QID) | ORAL | Status: DC | PRN
  Administered 2018-05-28 – 2018-05-29 (×2): 650 mg via ORAL
  Filled 2018-05-28 (×2): qty 20.3

## 2018-05-28 MED ORDER — FENTANYL CITRATE (PF) 100 MCG/2ML IJ SOLN
25.0000 ug | INTRAMUSCULAR | Status: DC | PRN
  Administered 2018-05-29 – 2018-05-30 (×2): 50 ug via INTRAVENOUS
  Filled 2018-05-28 (×3): qty 2

## 2018-05-28 NOTE — Progress Notes (Signed)
Son arrived for further discussion regarding plan of care, the patient is having increasing palpitations.  The son originally stated that the father would not want to be in a SNF and would not want trach/peg.  The son arrived yesterday and now today saying that patient would want trach/peg and full code status.  After a long discussion, decision was made to proceed with full code status, will check BMET in AM, if renal function is poor will call nephrology, if patient is not a dialysis candidate then will not proceed with trach/peg.  If the patient is a dialysis candidate then will trach/peg and place.  Will change code status.  The patient is critically ill with multiple organ systems failure and requires high complexity decision making for assessment and support, frequent evaluation and titration of therapies, application of advanced monitoring technologies and extensive interpretation of multiple databases.   Critical Care Time devoted to patient care services described in this note is  60  Minutes. This time reflects time of care of this signee Dr Koren Bound. This critical care time does not reflect procedure time, or teaching time or supervisory time of PA/NP/Med student/Med Resident etc but could involve care discussion time.  Alyson Reedy, M.D. [facility] Pulmonary/Critical Care Medicine. Pager: [phone]. After hours pager: [phone].

## 2018-05-28 NOTE — Progress Notes (Signed)
NAME:  Charles Hatfield, MRN:  [id number], DOB:  02/28/1941, LOS: 7 ADMISSION DATE:  05/21/2018, CONSULTATION DATE:  05/21/2018 REFERRING MD:  Donnald Garre, CHIEF COMPLAINT:  Black "coffee ground like" emesis   Brief History   77 yo male brought to ER with altered mental status, vomiting black material, hypotension, hypoxia.  Intubated in ER.  Concern for aspiration pneumonia and upper GI bleeding.  Significant Hospital Events   11/26 - admission and intubation 11/28: Off pressors.  Blood growing gram-positive cocci with prelim suggesting MRSA 11/29 cultures neg for MRSA. Vanc stopped.  11/30: passing SBT. Discussed high risk for re-intubation w/ son.  Cont abx. Will need NG small bore tube, failed SBT after prolonged trial  12/1: Failed spontaneous breathing trial profoundly weak long discussion with son.  Deciding now on one-way extubation in the next 24-48 hrs., with plan to likely transition to comfort if indicated. 12/2: continues to fail weaning trials due to profound weakness and deconditioning  Consults:  11/27 GI consult  Procedures:  ETT 11/26>>> Upper Endoscopy 11/27>>>  Significant Diagnostic Tests:  CT abd/pelvis 11/26 >> nephrolithiasis. CT chest 11.26 >> bilateral airspace disease R>L Echo 11/26 >> EF is 40 to 45%, inferior and lateral hypokinesis.  No vegetation. CT head 11/26 >> atrophy and old strokes  Micro Data:  Blood 11/26 >> S epidermidis, S. Hominis Urine 11/26 >> E. coli Sputum 11/26 >> pending  Antimicrobials:  Zosyn 11/26 >> 11/27 Unasyn 11/27 >>12/4 Vancomycin 11/27 >>11/29  Interim history/subjective:  Continues to fail weaning  Objective   Blood pressure (!) 100/45, pulse 74, temperature (!) 100.4 F (38 C), resp. rate 17, height 5\' 7"  (1.702 m), weight 59.3 kg, SpO2 100 %.    Vent Mode: PRVC FiO2 (%):  [30 %-40 %] 30 % Set Rate:  [14 bmp] 14 bmp Vt Set:  [520 mL] 520 mL PEEP:  [5 cmH20] 5 cmH20 Plateau Pressure:  [14 cmH20-20 cmH20] 20  cmH20   Intake/Output Summary (Last 24 hours) at 05/28/2018 1002 Last data filed at 05/28/2018 0900 Gross per 24 hour  Intake 475.94 ml  Output 4205 ml  Net -3729.06 ml   Filed Weights   05/25/18 0500 05/27/18 0500 05/28/18 0400  Weight: 62.8 kg 62.8 kg 59.3 kg    Examination: General: Emaciated elderly male, NAD on vent, does not appear uncomfortable HEENT: [place]/AT, PERRL, EOM-I and DMM Pulmonary: Diminished BS in all lung fields Cardiac: RRR, Nl S1/S2 and -M/R/G Extremities: Diffuse anasarca with left upper extremity swelling noted his extremities are weeping with scattered areas of ecchymosis Abdomen: Soft, NT, ND and +BS GU: Clear yellow. Skin: Multiple echimotic spots and multiple puncture sites  Resolved Hospital Problem list   Non-anion gap metabolic acidosis secondary to hyperchloremia  Assessment & Plan:  Acute respiratory failure with hypoxia with failure to wean in the setting of profound deconditioning and malnutrition Failed spontaneous breathing trial this a.m. Plan Hold further diureses Continue full vent support Son to determine withdrawal date, he is struggling with decision PAD protocol RASS goal 0 Continue abx for now, will likely d/c once transitioned to comfort, regardless, last dose is on 12/4 VAP bundle  Encephalopathy of unclear etiology - acute illness on background of microvascular disease -He is profoundly deconditioned Plan Supportive care  Aspiration pneumonia.  Not MRSA bacteremia. Mixed culture of CoNS possible contaminant Also e-coli UTI -off pressors Plan Continuing Unasyn, now day #7 of 8 total therapy  Repeating blood cultures D/C pressors, no further escalation of care  Focus more on comfort  Upper GI bleeding. Plan PPI indefinitely  Acute renal failure from ATN ->improving Plan IV fluids at [facility] further blood draws for now  Intermittent fluid and electrolyte imbalance  Plan Hold further blood draws for now  Hx of  CAD, LV mural thrombus, ischemic CM.  -f/u echo w/ EF 40-45%, no thrombus at this point.  Plan Holding anticoagulation Continuing telemetry  Severe protein calorie malnutrition. Plan Hold TF  Palliative encounter: Plan: Palliative care consult Fentanyl PRN for pain control  Best practice:  Diet: NPO - TF DVT prophylaxis: SCD GI prophylaxis: protonix BID Mobility: bed rest Code Status: DO NOT RESUSCITATE Family Communication: no recent update. Disposition:  Son to arrive today for discussion regarding plan of care, PCCM will continue to follow.  The patient is critically ill with multiple organ systems failure and requires high complexity decision making for assessment and support, frequent evaluation and titration of therapies, application of advanced monitoring technologies and extensive interpretation of multiple databases.   Critical Care Time devoted to patient care services described in this note is  33  Minutes. This time reflects time of care of this signee Dr Koren Bound. This critical care time does not reflect procedure time, or teaching time or supervisory time of PA/NP/Med student/Med Resident etc but could involve care discussion time.  Alyson Reedy, M.D. [facility] Pulmonary/Critical Care Medicine. Pager: [phone]. After hours pager: [phone].

## 2018-05-28 NOTE — Progress Notes (Signed)
Palliative care:  Discussed with Dr. Molli Knock who just had long conversation with patient's son regarding goals of care - will d/c palliative care consult per Dr. Percival Spanish request. Please let us know if we can be of assistance in the future.  Gerlean Ren, DNP, AGNP-C Palliative Medicine Team Team Phone # [phone]  Pager # [phone]

## 2018-05-28 NOTE — Progress Notes (Signed)
ETT holder replaced.

## 2018-05-29 ENCOUNTER — Encounter (HOSPITAL_COMMUNITY): Payer: Self-pay | Admitting: General Surgery

## 2018-05-29 ENCOUNTER — Inpatient Hospital Stay (HOSPITAL_COMMUNITY): Payer: Medicare Other

## 2018-05-29 ENCOUNTER — Encounter (HOSPITAL_COMMUNITY)
Admission: EM | Disposition: A | Discharge status: Nursing Home (Includes ICF) | Payer: Self-pay | Source: Home / Self Care | Attending: Pulmonary Disease

## 2018-05-29 DIAGNOSIS — E876 Hypokalemia: Secondary | ICD-10-CM

## 2018-05-29 DIAGNOSIS — I959 Hypotension, unspecified: Secondary | ICD-10-CM

## 2018-05-29 HISTORY — PX: ESOPHAGOGASTRODUODENOSCOPY: SHX5428

## 2018-05-29 HISTORY — PX: PEG PLACEMENT: SHX5437

## 2018-05-29 LAB — CBC
HCT: 23.9 % — ABNORMAL LOW (ref 39.0–52.0)
HEMATOCRIT: 22.4 % — AB (ref 39.0–52.0)
HEMOGLOBIN: 7.3 g/dL — AB (ref 13.0–17.0)
Hemoglobin: 7.8 g/dL — ABNORMAL LOW (ref 13.0–17.0)
MCH: 33.6 pg (ref 26.0–34.0)
MCH: 33.5 pg (ref 26.0–34.0)
MCHC: 32.6 g/dL (ref 30.0–36.0)
MCHC: 32.6 g/dL (ref 30.0–36.0)
MCV: 103 fL — ABNORMAL HIGH (ref 80.0–100.0)
MCV: 102.8 fL — ABNORMAL HIGH (ref 80.0–100.0)
PLATELETS: 226 10*3/uL (ref 150–400)
Platelets: 224 10*3/uL (ref 150–400)
RBC: 2.32 MIL/uL — ABNORMAL LOW (ref 4.22–5.81)
RBC: 2.18 MIL/uL — ABNORMAL LOW (ref 4.22–5.81)
RDW: 14.3 % (ref 11.5–15.5)
RDW: 14.2 % (ref 11.5–15.5)
WBC: 8.7 10*3/uL (ref 4.0–10.5)
WBC: 8.2 10*3/uL (ref 4.0–10.5)
nRBC: 0 % (ref 0.0–0.2)
nRBC: 0 % (ref 0.0–0.2)

## 2018-05-29 LAB — GLUCOSE, CAPILLARY
Glucose-Capillary: 122 mg/dL — ABNORMAL HIGH (ref 70–99)
Glucose-Capillary: 109 mg/dL — ABNORMAL HIGH (ref 70–99)
Glucose-Capillary: 101 mg/dL — ABNORMAL HIGH (ref 70–99)
Glucose-Capillary: 105 mg/dL — ABNORMAL HIGH (ref 70–99)
Glucose-Capillary: 127 mg/dL — ABNORMAL HIGH (ref 70–99)
Glucose-Capillary: 163 mg/dL — ABNORMAL HIGH (ref 70–99)

## 2018-05-29 LAB — BASIC METABOLIC PANEL
Anion gap: 12 (ref 5–15)
Anion gap: 10 (ref 5–15)
BUN: 21 mg/dL (ref 8–23)
BUN: 20 mg/dL (ref 8–23)
CO2: 26 mmol/L (ref 22–32)
CO2: 26 mmol/L (ref 22–32)
CREATININE: 1.21 mg/dL (ref 0.61–1.24)
Calcium: 7.4 mg/dL — ABNORMAL LOW (ref 8.9–10.3)
Calcium: 7.4 mg/dL — ABNORMAL LOW (ref 8.9–10.3)
Chloride: 103 mmol/L (ref 98–111)
Chloride: 105 mmol/L (ref 98–111)
Creatinine, Ser: 1.26 mg/dL — ABNORMAL HIGH (ref 0.61–1.24)
GFR calc Af Amer: >60 mL/min (ref >60)
GFR calc Af Amer: >60 mL/min (ref >60)
GFR calc non Af Amer: 57 mL/min — ABNORMAL LOW (ref >60)
GFR calc non Af Amer: 55 mL/min — ABNORMAL LOW (ref >60)
GLUCOSE: 116 mg/dL — AB (ref 70–99)
Glucose, Bld: 121 mg/dL — ABNORMAL HIGH (ref 70–99)
Potassium: 3.2 mmol/L — ABNORMAL LOW (ref 3.5–5.1)
Potassium: 3.2 mmol/L — ABNORMAL LOW (ref 3.5–5.1)
Sodium: 141 mmol/L (ref 135–145)
Sodium: 141 mmol/L (ref 135–145)

## 2018-05-29 LAB — POCT I-STAT 3, ART BLOOD GAS (G3+)
Acid-Base Excess: 5 mmol/L — ABNORMAL HIGH (ref 0.0–2.0)
Bicarbonate: 27.3 mmol/L (ref 20.0–28.0)
O2 Saturation: 98 %
Patient temperature: 100.2
TCO2: 28 mmol/L (ref 22–32)
pCO2 arterial: 31.7 mmHg — ABNORMAL LOW (ref 32.0–48.0)
pH, Arterial: 7.546 — ABNORMAL HIGH (ref 7.350–7.450)
pO2, Arterial: 101 mmHg (ref 83.0–108.0)

## 2018-05-29 LAB — PHOSPHORUS: Phosphorus: 3.8 mg/dL (ref 2.5–4.6)

## 2018-05-29 LAB — PROTIME-INR
INR: 1.36
INR: 1.31
Prothrombin Time: 16.6 seconds — ABNORMAL HIGH (ref 11.4–15.2)
Prothrombin Time: 16.1 seconds — ABNORMAL HIGH (ref 11.4–15.2)

## 2018-05-29 LAB — MAGNESIUM: Magnesium: 2 mg/dL (ref 1.7–2.4)

## 2018-05-29 LAB — APTT: aPTT: 28 seconds (ref 24–36)

## 2018-05-29 SURGERY — EGD (ESOPHAGOGASTRODUODENOSCOPY)
Anesthesia: Moderate Sedation

## 2018-05-29 MED ORDER — FENTANYL CITRATE (PF) 100 MCG/2ML IJ SOLN
200.0000 ug | Freq: Once | INTRAMUSCULAR | Status: AC
  Administered 2018-05-29: 100 ug via INTRAVENOUS
  Filled 2018-05-29: qty 4

## 2018-05-29 MED ORDER — SODIUM CHLORIDE 0.9 % IV BOLUS
500.0000 mL | Freq: Once | INTRAVENOUS | Status: AC
  Administered 2018-05-29: 500 mL via INTRAVENOUS

## 2018-05-29 MED ORDER — ETOMIDATE 2 MG/ML IV SOLN
40.0000 mg | Freq: Once | INTRAVENOUS | Status: AC
  Administered 2018-05-29: 10 mg via INTRAVENOUS
  Filled 2018-05-29: qty 20

## 2018-05-29 MED ORDER — ALBUMIN HUMAN 5 % IV SOLN
12.5000 g | Freq: Once | INTRAVENOUS | Status: AC
  Administered 2018-05-29: 12.5 g via INTRAVENOUS

## 2018-05-29 MED ORDER — VECURONIUM BROMIDE 10 MG IV SOLR
10.0000 mg | Freq: Once | INTRAVENOUS | Status: AC
  Administered 2018-05-29: 10 mg via INTRAVENOUS
  Filled 2018-05-29: qty 10

## 2018-05-29 MED ORDER — ALBUMIN HUMAN 5 % IV SOLN
INTRAVENOUS | Status: AC
  Filled 2018-05-29: qty 250

## 2018-05-29 MED ORDER — POTASSIUM CHLORIDE 20 MEQ/15ML (10%) PO SOLN
40.0000 meq | Freq: Three times a day (TID) | ORAL | Status: AC
  Administered 2018-05-29 (×2): 40 meq
  Filled 2018-05-29 (×2): qty 30

## 2018-05-29 MED ORDER — MIDAZOLAM HCL 2 MG/2ML IJ SOLN
5.0000 mg | Freq: Once | INTRAMUSCULAR | Status: AC
  Administered 2018-05-29: 2 mg via INTRAVENOUS
  Filled 2018-05-29: qty 6

## 2018-05-29 MED ORDER — NOREPINEPHRINE 4 MG/250ML-% IV SOLN
0.0000 ug/min | INTRAVENOUS | Status: DC
  Administered 2018-05-29: 2 ug/min via INTRAVENOUS
  Administered 2018-05-30: 7 ug/min via INTRAVENOUS
  Administered 2018-05-31: 2 ug/min via INTRAVENOUS
  Administered 2018-05-31: 8 ug/min via INTRAVENOUS
  Administered 2018-05-31: 22 ug/min via INTRAVENOUS
  Administered 2018-05-31: 3 ug/min via INTRAVENOUS
  Filled 2018-05-29 (×6): qty 250

## 2018-05-29 MED ORDER — PROPOFOL 10 MG/ML IV BOLUS
500.0000 mg | Freq: Once | INTRAVENOUS | Status: DC
  Filled 2018-05-29: qty 60

## 2018-05-29 NOTE — Progress Notes (Signed)
CSW consulted by MD as MD expressed that pt would more than likely be trach'd today and get a pegged later in the week. CSW was informed that pt will need vent/snf placement once this has occurred.   CSW has reached out to pt's son Charles Hatfield and left voicemail asking that he call CSW back to discuss this further and to complete assessment. CSW awaits call back at this time.    Charles Hatfield, MSW, LCSW-A Emergency Department Clinical Social Worker [phone]

## 2018-05-29 NOTE — Progress Notes (Signed)
NAME:  SHAILEN UGALDE, MRN:  [id number], DOB:  [birth year], LOS: 8 ADMISSION DATE:  05/21/2018, CONSULTATION DATE:  05/21/2018 REFERRING MD:  Donnald Garre, CHIEF COMPLAINT:  Black "coffee ground like" emesis   Brief History   77 yo male brought to ER with altered mental status, vomiting black material, hypotension, hypoxia.  Intubated in ER.  Concern for aspiration pneumonia and upper GI bleeding.  Significant Hospital Events   11/26 - admission and intubation 11/28: Off pressors.  Blood growing gram-positive cocci with prelim suggesting MRSA 11/29 cultures neg for MRSA. Vanc stopped.  11/30: passing SBT. Discussed high risk for re-intubation w/ son.  Cont abx. Will need NG small bore tube, failed SBT after prolonged trial  12/1: Failed spontaneous breathing trial profoundly weak long discussion with son.  Deciding now on one-way extubation in the next 24-48 hrs., with plan to likely transition to comfort if indicated. 12/2: continues to fail weaning trials due to profound weakness and deconditioning  Consults:  11/27 GI consult  Procedures:  ETT 11/26>>> Upper Endoscopy 11/27>>>  Significant Diagnostic Tests:  CT abd/pelvis 11/26 >> nephrolithiasis. CT chest 11.26 >> bilateral airspace disease R>L Echo 11/26 >> EF is 40 to 45%, inferior and lateral hypokinesis.  No vegetation. CT head 11/26 >> atrophy and old strokes  Micro Data:  Blood 11/26 >> S epidermidis, S. Hominis Urine 11/26 >> E. coli Sputum 11/26 >> pending  Antimicrobials:  Zosyn 11/26 >> 11/27 Unasyn 11/27 >>12/4 Vancomycin 11/27 >>11/29  Interim history/subjective:  Continues to fail weaning Episode of hypotension overnight treated with IVF  Objective   Blood pressure (!) 102/47, pulse 74, temperature (!) 100.4 F (38 C), temperature source Bladder, resp. rate 18, height 5\' 7"  (1.702 m), weight 58.2 kg, SpO2 98 %. CVP:  [7 mmHg-13 mmHg] 13 mmHg  Vent Mode: PRVC FiO2 (%):  [30 %] 30 % Set Rate:  [14 bmp] 14  bmp Vt Set:  [520 mL] 520 mL PEEP:  [5 cmH20] 5 cmH20 Plateau Pressure:  [11 cmH20-18 cmH20] 18 cmH20   Intake/Output Summary (Last 24 hours) at 05/29/2018 1018 Last data filed at 05/29/2018 0800 Gross per 24 hour  Intake 1862.41 ml  Output 2270 ml  Net -407.59 ml   Filed Weights   05/27/18 0500 05/28/18 0400 05/29/18 0447  Weight: 62.8 kg 59.3 kg 58.2 kg    Examination: General: Emaciated elderly male, NAD when on full support HEENT: [place]/AT, PERRL, EOM-I and MMM Pulmonary: Decreased BS in all lung fields Cardiac: RRR, Nl S1/S2 and -M/R/G Extremities: Decreasing edema, -tenderness Abdomen: Soft, NT, ND and +BS GU: Clear yellow. Skin: Multiple echimotic spots and multiple puncture sites  Resolved Hospital Problem list     Assessment & Plan:  Acute respiratory failure with hypoxia with failure to wean in the setting of profound deconditioning and malnutrition Failed spontaneous breathing trial this a.m. Plan Hold further diureses Continue full vent support Proceed with trach, patient's son wishes for full support, trach/peg/vent D/C all sedating medication D/C unasyn after today VAP bundle  Encephalopathy of unclear etiology - acute illness on background of microvascular disease -He is profoundly deconditioned Plan Supportive care  Aspiration pneumonia.  Not MRSA bacteremia. Mixed culture of CoNS possible contaminant Also e-coli UTI -off pressors Plan Continuing Unasyn, now day #8 of 8 total therapy  D/C further blood cultures Accept SBP of 80 as acceptable baseline  Upper GI bleeding. Plan PPI indefinitely  Acute renal failure from ATN ->improving Plan KVO IVF D/C diureses  Intermittent  fluid and electrolyte imbalance  Plan BMET in AM  Hx of CAD, LV mural thrombus, ischemic CM.  -f/u echo w/ EF 40-45%, no thrombus at this point.  Plan Holding anticoagulation Continuing telemetry  Severe protein calorie malnutrition. Plan Resume TF  Palliative  encounter: Plan: Full code status per discussion yesterday with son, please see notes D/C all sedating medications  The patient is critically ill with multiple organ systems failure and requires high complexity decision making for assessment and support, frequent evaluation and titration of therapies, application of advanced monitoring technologies and extensive interpretation of multiple databases.   Critical Care Time devoted to patient care services described in this note is  33  Minutes. This time reflects time of care of this signee Dr Koren Bound. This critical care time does not reflect procedure time, or teaching time or supervisory time of PA/NP/Med student/Med Resident etc but could involve care discussion time.  Alyson Reedy, M.D. [facility] Pulmonary/Critical Care Medicine. Pager: [phone]. After hours pager: [phone].

## 2018-05-29 NOTE — Progress Notes (Signed)
eLink Physician-Brief Progress Note Patient Name: Charles Hatfield DOB: February 25, 1941 MRN: [id number]   Date of Service  05/29/2018  HPI/Events of Note  Hypotension - BP = 97/38 with MAP = 56.   eICU Interventions  Will order: 1. CVP now and Q 4 hours.      Intervention Category Major Interventions: Hypotension - evaluation and management  Yaret Hush Eugene 05/29/2018, 12:36 AM

## 2018-05-29 NOTE — Procedures (Signed)
Percutaneous Tracheostomy Placement  Consent from family.  Patient sedated, paralyzed and position.  Placed on 100% FiO2 and RR matched.  Area cleaned and draped.  Lidocaine/epi injected.  Skin incision done followed by blunt dissection.  Trachea palpated then punctured, catheter passed and visualized bronchoscopically.  Wire placed and visualized.  Catheter removed.  Airway then entered and dilated.  Size 6 cuffed shiley trach placed and visualized bronchoscopically well above carina.  Good volume returns.  Patient tolerated the procedure well without complications.  Minimal blood loss.  CXR ordered and pending.  Wesam G. Yacoub, M.D. [facility] Pulmonary/Critical Care Medicine. Pager: [phone]. After hours pager: [phone].  

## 2018-05-29 NOTE — Progress Notes (Signed)
RT obtained ABG on pt with the following results. RT will continue to monitor.   Results for Charles Hatfield, Charles Hatfield (MRN [id number]) as of 05/29/2018 05:54  Ref. Range 05/29/2018 05:22  Sample type Unknown ARTERIAL  pH, Arterial Latest Ref Range: 7.350 - 7.450  7.546 (H)  pCO2 arterial Latest Ref Range: 32.0 - 48.0 mmHg 31.7 (L)  pO2, Arterial Latest Ref Range: 83.0 - 108.0 mmHg 101.0  TCO2 Latest Ref Range: 22 - 32 mmol/L 28  Acid-Base Excess Latest Ref Range: 0.0 - 2.0 mmol/L 5.0 (H)  Bicarbonate Latest Ref Range: 20.0 - 28.0 mmol/L 27.3  O2 Saturation Latest Units: % 98.0  Patient temperature Unknown 100.2 F  Collection site Unknown RADIAL, ALLEN'S TEST ACCEPTABLE

## 2018-05-29 NOTE — Progress Notes (Signed)
eLink Physician-Brief Progress Note Patient Name: Charles Hatfield DOB: 1941/04/06 MRN: [id number]   Date of Service  05/29/2018  HPI/Events of Note  Hypotension  eICU Interventions  Bolus with 0.9 NaCl 500 mL IV over 20 minutes now.      Intervention Category Major Interventions: Hypotension - evaluation and management  Marland Reine Eugene 05/29/2018, 5:19 AM

## 2018-05-29 NOTE — Procedures (Signed)
Procedure done by K. Zeah Germano NP, under direct supervision of Dr Molli Knock.  At first bronch was introduce through ET tube and structures of tracheal rings, carina identified for operator of tracheostomy who was Dr Molli Knock. Light of bronch passed through trachea and skin for indentification of tracheal rings for tracheostomy puncture. After this, under bronchoscopy guidance,  ET tube was pulled back sufficiently and very carefully. The ET tube was  pulled back enough to give room for tracheostomy operator and yet at same time to to ensure a secured airway. After this was accomplished, bronchoscope was withdrawn into the ET tube. After this,  Dr Molli Knock then performed tracheostomy under video visual provided by flexible video bronchoscopy. Followng introduction of tracheostomy,  the bronchoscope was removed from ET tube and introduced through tracheostomy. Correct position of tracheostomy was ensured, with enough room between carina and distal tracheostomy and no evidence of bleeding. The bronchoscope was then withdrawn. Respiratory therapist was then instructed to remove the ET tube.  Dr Molli Knock then proceeded to complete the tracheostomy with stay sutures   No complications   Charles Dress, NP 05/29/2018  2:19 PM Pager: [phone] or [phone]

## 2018-05-29 NOTE — Procedures (Signed)
Bedside Tracheostomy Insertion Procedure Note   Patient Details:   Name: Charles Hatfield DOB: 17-Jul-1940 MRN: [id number]  Procedure: Tracheostomy  Pre Procedure Assessment: ET Tube Size:7.5   ET Tube secured at lip (cm):24 cm Bite block in place: No Breath Sounds: Clear  Post Procedure Assessment: BP (!) 101/46   Pulse 74   Temp 100.2 F (37.9 C)   Resp (!) 21   Ht 5\' 7"  (1.702 m)   Wt 60.7 kg   SpO2 96%   BMI 20.96 kg/m  O2 sats: stable throughout Complications: No apparent complications Patient did tolerate procedure well Tracheostomy Brand:Shiley Tracheostomy Style:Cuffed Tracheostomy Size: 6.0 Tracheostomy Secured EVO:[id number] and velcro Tracheostomy Placement Confirmation:Trach cuff visualized and in place, CXR ordered    Sereniti Wan A Bell-Byess 05/29/2018, 2:06 PM

## 2018-05-29 NOTE — Progress Notes (Signed)
Orthopedic Tech Progress Note Patient Details:  Charles Hatfield 06-05-1941 [id number]  Ortho Devices Type of Ortho Device: Abdominal binder Ortho Device/Splint Location: delivered to rn Ortho Device/Splint Interventions: Ordered, Application, Adjustment   Post Interventions Patient Tolerated: Well Instructions Provided: Care of device, Adjustment of device   Trinna Post 05/29/2018, 6:29 PM

## 2018-05-29 NOTE — Consult Note (Addendum)
[facility] Surgery Consult Note  TARUN PATCHELL 30-Sep-1940  [id number].    Requesting MD: Lynnell Catalan Chief Complaint/Reason for Consult: PEG  HPI:  Charles Hatfield is a 77yo male PMH CAD s/p CABG, LV mural thrombus, ischemic CM, DM, HLD, COPD, h/o CVA who was admitted to [facility] 11/26 with AMS and vomiting black material. Patient was found to be hypoxic as well as hypotensive in the ED, he was intubated and started on pressors.  Workup revealed aspiration pneumonia and E coli UTI. He will complete 8 days of unasyn today and is now off pressors. Patient developed acute respiratory failure with hypoxia with failure to wean from the ventilator. He is undergoing tracheostomy today.  Trauma asked to see for consideration of PEG.  -Abdominal surgical history: ascending thoracic aorta repair -Anticoagulants: previously on warfarin for LV mural thrombus, but per chart patient has been off this for several weeks  ROS: unable to fully assess ROS due to AMS   Family History  Problem Relation Age of Onset  . Heart disease Father   . Heart attack Brother     Past Medical History:  Diagnosis Date  . Atrial fibrillation (HCC)    perioperative  . CAD (coronary artery disease)   . Carotid stenosis    bilateral  . COPD (chronic obstructive pulmonary disease) (HCC)   . Critical lower limb ischemia   . Diabetes mellitus   . DM (diabetes mellitus) (HCC) 01/15/2012  . Dyslipidemia   . H/O ascending aorta repair   . Hematuria 01/15/2012  . Hypertension   . Ischemic cardiomyopathy   . Left ventricular apical thrombus following MI, NSTEMI, 01/13/12 01/15/2012  . LV (left ventricular) mural thrombus    inferapical  . S/P CABG x 3 01/22/2012   LIMA to LAD,SVG to second diagonal,SVG to distal RCA  . Staghorn renal calculus 01/15/2012  . Stroke (HCC)   . Thrombocytopenia (HCC)     Past Surgical History:  Procedure Laterality Date  . ABDOMINAL ANGIOGRAM  03/03/2013   Procedure: ABDOMINAL  ANGIOGRAM;  Surgeon: Runell Gess, MD;  Location: [facility] CATH LAB;  Service: Cardiovascular;;  . CAROTID ENDARTERECTOMY  01/13/2012  . CORONARY ANGIOGRAM  01/16/2012   Procedure: CORONARY ANGIOGRAM;  Surgeon: Thurmon Fair, MD;  Location: [facility] CATH LAB;  Service: Cardiovascular;;  . CORONARY ARTERY BYPASS GRAFT  01/22/2012   Procedure: CORONARY ARTERY BYPASS GRAFTING (CABG);  Surgeon: Kerin Perna, MD;  Location: [facility] OR;  Service: Open Heart Surgery;  Laterality: N/A;  CABG x three using left internal mammary artery and right leg greater saphenous vein harvested endoscopically  . ENDARTERECTOMY  01/22/2012   Procedure: ENDARTERECTOMY CAROTID;  Surgeon: Larina Earthly, MD;  Location: [name] OR;  Service: Vascular;  Laterality: Left;  . ESOPHAGOGASTRODUODENOSCOPY N/A 05/22/2018   Procedure: ESOPHAGOGASTRODUODENOSCOPY (EGD);  Surgeon: Bernette Redbird, MD;  Location: [facility] ENDOSCOPY;  Service: Endoscopy;  Laterality: N/A;  . LOWER EXTREMITY ANGIOGRAM Left 03/03/2013   Procedure: LOWER EXTREMITY ANGIOGRAM;  Surgeon: Runell Gess, MD;  Location: [facility] CATH LAB;  Service: Cardiovascular;  Laterality: Left;  . NM MYOVIEW LTD  02/20/2012   infarct/scar w/mild perinfarct ischemia, EF 38%, abnormal nuc, no sign. ischemia  . REPAIR THORACIC AORTA     Ascending thoracic aorto repair    Social History:  reports that he quit smoking about 6 years ago. His smoking use included cigarettes. He smoked 1.00 pack per day. He has never used smokeless tobacco. He reports that he does not drink  alcohol or use drugs.  Allergies: No Known Allergies  Medications Prior to Admission  Medication Sig Dispense Refill  . acetaminophen (TYLENOL) 500 MG tablet Take 1,000 mg by mouth every 6 (six) hours as needed for pain.     Marland Kitchen aspirin EC 81 MG tablet Take 81 mg by mouth daily.    . diphenhydramine-acetaminophen (TYLENOL PM EXTRA STRENGTH) 25-500 MG TABS tablet Take 1-2 tablets by mouth at bedtime as needed (sleep).    .  Maltodextrin-Xanthan Gum (RESOURCE THICKENUP CLEAR) POWD Take 360 g by mouth as needed. 3 Can 0  . nitroGLYCERIN (NITROSTAT) 0.4 MG SL tablet DISSOLVE ONE TABLET UNDER THE TONGUE EVERY 5 MINUTES AS NEEDED FOR CHEST PAIN.  DO NOT EXCEED A TOTAL OF 3 DOSES IN 15 MINUTES NOW 25 tablet 2  . nicotine (NICODERM CQ - DOSED IN MG/24 HR) 7 mg/24hr patch 7 mg patch daily 3 weeks and stop 28 patch 0    Prior to Admission medications   Medication Sig Start Date End Date Taking? Authorizing Provider  acetaminophen (TYLENOL) 500 MG tablet Take 1,000 mg by mouth every 6 (six) hours as needed for pain.    Yes [provider]  aspirin EC 81 MG tablet Take 81 mg by mouth daily.   Yes [provider]  diphenhydramine-acetaminophen (TYLENOL PM EXTRA STRENGTH) 25-500 MG TABS tablet Take 1-2 tablets by mouth at bedtime as needed (sleep).   Yes [provider]  Maltodextrin-Xanthan Gum (RESOURCE THICKENUP CLEAR) POWD Take 360 g by mouth as needed. 07/13/17  Yes Angiulli, Mcarthur Rossetti, PA-C  nitroGLYCERIN (NITROSTAT) 0.4 MG SL tablet DISSOLVE ONE TABLET UNDER THE TONGUE EVERY 5 MINUTES AS NEEDED FOR CHEST PAIN.  DO NOT EXCEED A TOTAL OF 3 DOSES IN 15 MINUTES NOW 07/13/17  Yes Angiulli, Mcarthur Rossetti, PA-C  nicotine (NICODERM CQ - DOSED IN MG/24 HR) 7 mg/24hr patch 7 mg patch daily 3 weeks and stop 07/13/17   Angiulli, Mcarthur Rossetti, PA-C    Blood pressure (!) 104/59, pulse 79, temperature 100.2 F (37.9 C), resp. rate 19, height 5\' 7"  (1.702 m), weight 60.7 kg, SpO2 99 %. Physical Exam: General: frail white male who is laying in bed in NAD HEENT: head is normocephalic, atraumatic.  Sclera are noninjected.  Pupils equal round reactive to light.  Ears and nose without any masses or lesions.  Mouth is pink and dry. Dentition poor. ETT in place Heart: regular, rate, and rhythm Lungs: diffuse rhonchi, no wheezes. On full vent support Abd: well healed midline incision, soft, NT/ND, +BS, no masses, hernias, or  organomegaly MS: moderate edema BUE. Calves soft and nontender bilaterally.  Skin: warm and dry with no masses, lesions, or rashes Psych: opens eyes but does not consistently follow commands  Results for orders placed or performed during the hospital encounter of 05/21/18 (from the past 48 hour(s))  Glucose, capillary     Status: Abnormal   Collection Time: 05/27/18  3:24 PM  Result Value Ref Range   Glucose-Capillary 103 (H) 70 - 99 mg/dL  Glucose, capillary     Status: Abnormal   Collection Time: 05/27/18  8:03 PM  Result Value Ref Range   Glucose-Capillary 113 (H) 70 - 99 mg/dL  Glucose, capillary     Status: Abnormal   Collection Time: 05/28/18 12:10 AM  Result Value Ref Range   Glucose-Capillary 112 (H) 70 - 99 mg/dL  Glucose, capillary     Status: Abnormal   Collection Time: 05/28/18  4:21 AM  Result Value Ref Range   Glucose-Capillary 102 (H) 70 - 99 mg/dL  Glucose, capillary     Status: Abnormal   Collection Time: 05/28/18  7:50 AM  Result Value Ref Range   Glucose-Capillary 120 (H) 70 - 99 mg/dL  Glucose, capillary     Status: None   Collection Time: 05/28/18 11:19 AM  Result Value Ref Range   Glucose-Capillary 96 70 - 99 mg/dL  CBC     Status: Abnormal   Collection Time: 05/28/18  3:14 PM  Result Value Ref Range   WBC 11.7 (H) 4.0 - 10.5 K/uL   RBC 2.67 (L) 4.22 - 5.81 MIL/uL   Hemoglobin 8.7 (L) 13.0 - 17.0 g/dL   HCT 09.8 (L) 11.9 - 14.7 %   MCV 102.6 (H) 80.0 - 100.0 fL   MCH 32.6 26.0 - 34.0 pg   MCHC 31.8 30.0 - 36.0 g/dL   RDW 82.9 56.2 - 13.0 %   Platelets 221 150 - 400 K/uL   nRBC 0.0 0.0 - 0.2 %    Comment: Performed at [facility] Lab, [address], [place], [place] [address]  Comprehensive metabolic panel     Status: Abnormal   Collection Time: 05/28/18  3:14 PM  Result Value Ref Range   Sodium 138 135 - 145 mmol/L   Potassium 3.6 3.5 - 5.1 mmol/L   Chloride 101 98 - 111 mmol/L   CO2 26 22 - 32 mmol/L   Glucose, Bld 100 (H) 70 - 99 mg/dL    BUN 22 8 - 23 mg/dL   Creatinine, Ser 4.69 (H) 0.61 - 1.24 mg/dL   Calcium 7.5 (L) 8.9 - 10.3 mg/dL   Total Protein 4.6 (L) 6.5 - 8.1 g/dL   Albumin 1.6 (L) 3.5 - 5.0 g/dL   AST 86 (H) 15 - 41 U/L   ALT 58 (H) 0 - 44 U/L   Alkaline Phosphatase 59 38 - 126 U/L   Total Bilirubin 0.6 0.3 - 1.2 mg/dL   GFR calc non Af Amer 54 (L) >60 mL/min   GFR calc Af Amer >60 >60 mL/min   Anion gap 11 5 - 15    Comment: Performed at [facility], [address], [place], [place] [address]  Magnesium     Status: None   Collection Time: 05/28/18  3:14 PM  Result Value Ref Range   Magnesium 1.9 1.7 - 2.4 mg/dL    Comment: Performed at [facility], [address], [place], [place] [address]  Phosphorus     Status: None   Collection Time: 05/28/18  3:14 PM  Result Value Ref Range   Phosphorus 3.8 2.5 - 4.6 mg/dL    Comment: Performed at [facility], [address] [place], [place] [address]  Glucose, capillary     Status: None   Collection Time: 05/28/18  3:24 PM  Result Value Ref Range   Glucose-Capillary 90 70 - 99 mg/dL  Glucose, capillary     Status: Abnormal   Collection Time: 05/28/18  7:23 PM  Result Value Ref Range   Glucose-Capillary 107 (H) 70 - 99 mg/dL   Comment 1 Notify RN   Glucose, capillary     Status: Abnormal   Collection Time: 05/28/18 11:46 PM  Result Value Ref Range   Glucose-Capillary 114 (H) 70 - 99 mg/dL  Glucose, capillary     Status: Abnormal   Collection Time: 05/29/18  4:14 AM  Result Value Ref  Range   Glucose-Capillary 122 (H) 70 - 99 mg/dL   Comment 1 Notify RN   CBC     Status: Abnormal   Collection Time: 05/29/18  5:01 AM  Result Value Ref Range   WBC 8.7 4.0 - 10.5 K/uL   RBC 2.32 (L) 4.22 - 5.81 MIL/uL   Hemoglobin 7.8 (L) 13.0 - 17.0 g/dL   HCT 16.1 (L) 09.6 - 04.5 %   MCV 103.0 (H) 80.0 - 100.0 fL   MCH 33.6 26.0 - 34.0 pg   MCHC 32.6 30.0 - 36.0 g/dL   RDW 40.9 81.1 - 91.4 %   Platelets 226 150 - 400 K/uL   nRBC 0.0 0.0 - 0.2 %     Comment: Performed at [facility], [address] [place], [place] [address]  Basic metabolic panel     Status: Abnormal   Collection Time: 05/29/18  5:01 AM  Result Value Ref Range   Sodium 141 135 - 145 mmol/L   Potassium 3.2 (L) 3.5 - 5.1 mmol/L   Chloride 103 98 - 111 mmol/L   CO2 26 22 - 32 mmol/L   Glucose, Bld 121 (H) 70 - 99 mg/dL   BUN 21 8 - 23 mg/dL   Creatinine, Ser 6.21 0.61 - 1.24 mg/dL   Calcium 7.4 (L) 8.9 - 10.3 mg/dL   GFR calc non Af Amer 57 (L) >60 mL/min   GFR calc Af Amer >60 >60 mL/min   Anion gap 12 5 - 15    Comment: Performed at [facility], [address], [place], [place] [address]  Magnesium     Status: None   Collection Time: 05/29/18  5:01 AM  Result Value Ref Range   Magnesium 2.0 1.7 - 2.4 mg/dL    Comment: Performed at [facility], [address], [place], [place] [address]  Phosphorus     Status: None   Collection Time: 05/29/18  5:01 AM  Result Value Ref Range   Phosphorus 3.8 2.5 - 4.6 mg/dL    Comment: Performed at [facility], [address], [place], [place] [address]  I-STAT 3, arterial blood gas (G3+)     Status: Abnormal   Collection Time: 05/29/18  5:22 AM  Result Value Ref Range   pH, Arterial 7.546 (H) 7.350 - 7.450   pCO2 arterial 31.7 (L) 32.0 - 48.0 mmHg   pO2, Arterial 101.0 83.0 - 108.0 mmHg   Bicarbonate 27.3 20.0 - 28.0 mmol/L   TCO2 28 22 - 32 mmol/L   O2 Saturation 98.0 %   Acid-Base Excess 5.0 (H) 0.0 - 2.0 mmol/L   Patient temperature 100.2 F    Collection site RADIAL, ALLEN'S TEST ACCEPTABLE    Drawn by Operator    Sample type ARTERIAL   Glucose, capillary     Status: Abnormal   Collection Time: 05/29/18  7:57 AM  Result Value Ref Range   Glucose-Capillary 109 (H) 70 - 99 mg/dL  CBC     Status: Abnormal   Collection Time: 05/29/18  8:59 AM  Result Value Ref Range   WBC 8.2 4.0 - 10.5 K/uL   RBC 2.18 (L) 4.22 - 5.81 MIL/uL   Hemoglobin 7.3 (L) 13.0 - 17.0 g/dL   HCT 84.1 (L)  32.4 - 52.0 %   MCV 102.8 (H) 80.0 - 100.0 fL   MCH 33.5 26.0 - 34.0 pg   MCHC 32.6 30.0 - 36.0 g/dL   RDW 40.1  11.5 - 15.5 %   Platelets 224 150 - 400 K/uL   nRBC 0.0 0.0 - 0.2 %    Comment: Performed at [facility], [address] [place], [place] [address]  Basic metabolic panel     Status: Abnormal   Collection Time: 05/29/18  8:59 AM  Result Value Ref Range   Sodium 141 135 - 145 mmol/L   Potassium 3.2 (L) 3.5 - 5.1 mmol/L   Chloride 105 98 - 111 mmol/L   CO2 26 22 - 32 mmol/L   Glucose, Bld 116 (H) 70 - 99 mg/dL   BUN 20 8 - 23 mg/dL   Creatinine, Ser 6.21 (H) 0.61 - 1.24 mg/dL   Calcium 7.4 (L) 8.9 - 10.3 mg/dL   GFR calc non Af Amer 55 (L) >60 mL/min   GFR calc Af Amer >60 >60 mL/min   Anion gap 10 5 - 15    Comment: Performed at [facility], [address] [place], [place] [address]  Protime-INR     Status: Abnormal   Collection Time: 05/29/18 11:09 AM  Result Value Ref Range   Prothrombin Time 16.6 (H) 11.4 - 15.2 seconds   INR 1.36     Comment: Performed at [facility], [address], [place], [place] [address]  Glucose, capillary     Status: Abnormal   Collection Time: 05/29/18 11:40 AM  Result Value Ref Range   Glucose-Capillary 101 (H) 70 - 99 mg/dL   Dg Chest Port 1 View  Result Date: 05/29/2018 CLINICAL DATA:  Intubated. EXAM: PORTABLE CHEST 1 VIEW COMPARISON:  05/27/2018 FINDINGS: Endotracheal tube terminates 5.5 cm above the carina. Right jugular catheter terminates over the mid SVC. Enteric tube courses into the upper abdomen with tip not imaged. Sequelae of CABG are again identified. Patchy bibasilar airspace opacities are unchanged. The left hemidiaphragm remains partially obscured. No sizable pleural effusion or pneumothorax is identified. IMPRESSION: Unchanged bibasilar airspace disease. Electronically Signed   By: Sebastian Ache M.D.   On: 05/29/2018 07:13   Anti-infectives (From admission, onward)   Start     Dose/Rate Route  Frequency Ordered Stop   05/25/18 1700  Ampicillin-Sulbactam (UNASYN) 3 g in sodium chloride 0.9 % 100 mL IVPB     3 g 200 mL/hr over 30 Minutes Intravenous Every 8 hours 05/25/18 1001 05/30/18 0059   05/24/18 2000  ampicillin-sulbactam (UNASYN) 1.5 g in sodium chloride 0.9 % 100 mL IVPB  Status:  Discontinued     1.5 g 200 mL/hr over 30 Minutes Intravenous Every 12 hours 05/24/18 1042 05/25/18 1001   05/23/18 1600  vancomycin (VANCOCIN) IVPB 750 mg/150 ml premix  Status:  Discontinued     750 mg 150 mL/hr over 60 Minutes Intravenous Every 48 hours 05/21/18 1524 05/21/18 1525   05/23/18 1000  vancomycin (VANCOCIN) IVPB 750 mg/150 ml premix  Status:  Discontinued     750 mg 150 mL/hr over 60 Minutes Intravenous Every 24 hours 05/22/18 0557 05/22/18 1757   05/22/18 2000  ampicillin-sulbactam (UNASYN) 1.5 g in sodium chloride 0.9 % 100 mL IVPB  Status:  Discontinued     1.5 g 200 mL/hr over 30 Minutes Intravenous Every 12 hours 05/22/18 1218 05/24/18 1042   05/22/18 1500  ceFEPIme (MAXIPIME) 1 g in sodium chloride 0.9 % 100 mL IVPB  Status:  Discontinued     1 g 200 mL/hr over 30 Minutes Intravenous Every 24 hours 05/21/18 1524 05/21/18 1525  05/22/18 0800  piperacillin-tazobactam (ZOSYN) IVPB 3.375 g  Status:  Discontinued     3.375 g 12.5 mL/hr over 240 Minutes Intravenous Every 8 hours 05/22/18 0549 05/22/18 1218   05/22/18 0630  vancomycin (VANCOCIN) IVPB 1000 mg/200 mL premix     1,000 mg 200 mL/hr over 60 Minutes Intravenous  Once 05/22/18 0557 05/22/18 0728   05/21/18 1800  piperacillin-tazobactam (ZOSYN) IVPB 2.25 g  Status:  Discontinued     2.25 g 100 mL/hr over 30 Minutes Intravenous Every 8 hours 05/21/18 1535 05/22/18 0547   05/21/18 1445  ceFEPIme (MAXIPIME) 2 g in sodium chloride 0.9 % 100 mL IVPB  Status:  Discontinued     2 g 200 mL/hr over 30 Minutes Intravenous  Once 05/21/18 1444 05/21/18 1540   05/21/18 1445  metroNIDAZOLE (FLAGYL) IVPB 500 mg  Status:  Discontinued      500 mg 100 mL/hr over 60 Minutes Intravenous Every 8 hours 05/21/18 1444 05/21/18 1525   05/21/18 1445  vancomycin (VANCOCIN) IVPB 1000 mg/200 mL premix  Status:  Discontinued     1,000 mg 200 mL/hr over 60 Minutes Intravenous  Once 05/21/18 1444 05/21/18 1532        Assessment/Plan CAD s/p CABG LV mural thrombus Ischemic CM DM HLD COPD H/o CVA Aspiration pneumonia E coli UTI AKI Acute respiratory failure with hypoxia and failure to wean from the ventilator - undergoing tracheostomy today  Dysphagia - Plan for PEG placement today after trach. Continue to hold tube feeds. - I spoke with the patient's son, Maxamilian Amadon, on the phone and obtained verbal consent for the procedure.  ID - currently unasyn 11/25>> VTE - SCDs FEN - IVF, TF on hold for procedure Foley - in place  Franne Forts, [facility] 05/29/2018, 12:19 PM Pager: [phone] Mon 7:00 am -11:30 AM Tues-Fri 7:00 am-4:30 pm Sat-Sun 7:00 am-11:30 am

## 2018-05-29 NOTE — Progress Notes (Signed)
eLink Physician-Brief Progress Note Patient Name: Charles Hatfield DOB: 26-Jul-1940 MRN: [id number]   Date of Service  05/29/2018  HPI/Events of Note  BP = 97/38 with MAP = 56 - CVP = 7. Albumin = 1.6.   eICU Interventions  Will order: 1. 5% Albumin 12.5 gm IV now. 2. 0.9 NaCl 500 mL IV over 30 minutes now.  3. Hold 6 AM Lasix dose until patient re-evaluated by rounding team.      Intervention Category Major Interventions: Hypotension - evaluation and management  Sommer,Steven Eugene 05/29/2018, 12:59 AM

## 2018-05-29 NOTE — Progress Notes (Signed)
Nutrition Follow-up  DOCUMENTATION CODES:   Severe malnutrition in context of acute illness/injury(likely superimposed on chronic malnutrition)  INTERVENTION:   Resume TF after trach and PEG placed:  Vital AF 1.2 at 65 ml/h (1560 ml per day)  Provides 1872 kcal, 117 gm protein, 1265 ml free water daily  NUTRITION DIAGNOSIS:   Severe Malnutrition related to acute illness(likely superimposed on chronic malnutrition) as evidenced by moderate muscle depletion, moderate fat depletion, energy intake < or equal to 50% for > or equal to 5 days.  Ongoing   GOAL:   Patient will meet greater than or equal to 90% of their needs  Progressing   MONITOR:   TF tolerance, Vent status, Labs, Weight trends, Skin  ASSESSMENT:   77 yo male with PMH of HTN, DM, stroke, COPD, CAD, HLD, ischemic cardiomyopathy who was admitted on 11/26 with AMS, vomiting black material, hypotension, hypoxia. Intubated in the ED.  Patient's family has decided to continue full aggressive medical care. Trach and PEG being placed today.   Patient remains intubated on ventilator support MV: 10 L/min Temp (24hrs), Avg:100.5 F (38.1 C), Min:99 F (37.2 C), Max:101.5 F (38.6 C)  Labs reviewed. Potassium 3.2 (L), magnesium 2 WNL, phosphorus 3.8 WNL. Medications reviewed and include novolog, MVI, KCl, thiamine.  I/O + 1.5 L and weight up 4.5 kg since admission  Free water flushes 200 ml every 4 hours TF currently on hold for trach and PEG placement.   Diet Order:   Diet Order            Diet NPO time specified  Diet effective midnight        Diet NPO time specified  Diet effective now              EDUCATION NEEDS:   No education needs have been identified at this time  Skin:  Skin Assessment: Skin Integrity Issues: Skin Integrity Issues:: DTI DTI: R heel  Last BM:  12/1 (type 7)  Height:   Ht Readings from Last 1 Encounters:  05/21/18 5\' 7"  (1.702 m)    Weight:   Wt Readings from Last  1 Encounters:  05/29/18 60.7 kg    Ideal Body Weight:  67.3 kg  BMI:  Body mass index is 20.96 kg/m.  Estimated Nutritional Needs:   Kcal:  1800  Protein:  90-115 g  Fluid:  1.8 L    Joaquin Courts, RD, LDN, CNSC Pager [phone] After Hours Pager [phone]

## 2018-05-29 NOTE — Clinical Social Work Note (Signed)
Clinical Social Work Assessment  Patient Details  Name: Charles Hatfield MRN: [id number] Date of Birth: 1940-08-07  Date of referral:  05/29/18               Reason for consult:  Facility Placement, Discharge Planning                Permission sought to share information with:  Family Supports Permission granted to share information::  Yes, Release of Information Signed  Name::     Colbey Knopp   Agency::  family and vent/ snf facility   Relationship::  son  Contact Information:  Shariff Corneau [phone]  Housing/Transportation Living arrangements for the past 2 months:  Single Family Home(with son and other people. ) Source of Information:  Adult Children Patient Interpreter Needed:  None Criminal Activity/Legal Involvement Pertinent to Current Situation/Hospitalization:  No - Comment as needed Significant Relationships:  Adult Children Lives with:  Adult Children Do you feel safe going back to the place where you live?  Yes Need for family participation in patient care:  Yes (Comment)  Care giving concerns:  CSW consulted verbally for vent/snf placement once pt is medically stable for discharge.    Social Worker assessment / plan:  CSW spoke with pt's son Kathlene November via phone as pt is intubated at this time. CSW was informed that pt is from home with son and some other people. CSW spoke with Kathlene November about the possibility of pt being placed out of state if vent/snf placement is needed at the time of discharge. Kathlene November expressed understanding of this this at this time.   Employment status:  Retired Health and safety inspector:  Medicare PT Recommendations:  Not assessed at this time Information / Referral to community resources:  Skilled Nursing Facility  Patient/Family's Response to care:  Pt's son appeared to be understanding and agreeable to plan of care at this time.  Patient/Family's Understanding of and Emotional Response to Diagnosis, Current Treatment, and Prognosis:  No further questions  or concerns have been presented to CSW. CSW will continue to follow at this time.   Emotional Assessment Appearance:  Appears stated age Attitude/Demeanor/Rapport:  Unable to Assess Affect (typically observed):  Unable to Assess Orientation:  (CSW spoke with son. ) Alcohol / Substance use:  Not Applicable Psych involvement (Current and /or in the community):  No (Comment)  Discharge Needs  Concerns to be addressed:  Lack of Support, Care Coordination Readmission within the last 30 days:  No Current discharge risk:  Dependent with Mobility, Lack of support system Barriers to Discharge:  Continued Medical Work up(pt gettign trach and peg placed. )   Robb Matar, LCSWA 05/29/2018, 10:35 AM

## 2018-05-30 ENCOUNTER — Inpatient Hospital Stay (HOSPITAL_COMMUNITY): Payer: Medicare Other

## 2018-05-30 DIAGNOSIS — Z930 Tracheostomy status: Secondary | ICD-10-CM

## 2018-05-30 DIAGNOSIS — I5022 Chronic systolic (congestive) heart failure: Secondary | ICD-10-CM

## 2018-05-30 DIAGNOSIS — Z9911 Dependence on respirator [ventilator] status: Secondary | ICD-10-CM

## 2018-05-30 DIAGNOSIS — R531 Weakness: Secondary | ICD-10-CM

## 2018-05-30 DIAGNOSIS — R579 Shock, unspecified: Secondary | ICD-10-CM

## 2018-05-30 LAB — GLUCOSE, CAPILLARY
GLUCOSE-CAPILLARY: 159 mg/dL — AB (ref 70–99)
Glucose-Capillary: 154 mg/dL — ABNORMAL HIGH (ref 70–99)
Glucose-Capillary: 156 mg/dL — ABNORMAL HIGH (ref 70–99)
Glucose-Capillary: 161 mg/dL — ABNORMAL HIGH (ref 70–99)
Glucose-Capillary: 177 mg/dL — ABNORMAL HIGH (ref 70–99)
Glucose-Capillary: 177 mg/dL — ABNORMAL HIGH (ref 70–99)

## 2018-05-30 LAB — POCT I-STAT 3, ART BLOOD GAS (G3+)
Bicarbonate: 21.8 mmol/L (ref 20.0–28.0)
O2 Saturation: 91 %
Patient temperature: 98
TCO2: 23 mmol/L (ref 22–32)
pCO2 arterial: 25.7 mmHg — ABNORMAL LOW (ref 32.0–48.0)
pH, Arterial: 7.535 — ABNORMAL HIGH (ref 7.350–7.450)
pO2, Arterial: 50 mmHg — ABNORMAL LOW (ref 83.0–108.0)

## 2018-05-30 LAB — BASIC METABOLIC PANEL
Anion gap: 11 (ref 5–15)
BUN: 21 mg/dL (ref 8–23)
CO2: 23 mmol/L (ref 22–32)
CREATININE: 1.02 mg/dL (ref 0.61–1.24)
Calcium: 7.5 mg/dL — ABNORMAL LOW (ref 8.9–10.3)
Chloride: 106 mmol/L (ref 98–111)
GFR calc Af Amer: >60 mL/min (ref >60)
GFR calc non Af Amer: >60 mL/min (ref >60)
Glucose, Bld: 202 mg/dL — ABNORMAL HIGH (ref 70–99)
Potassium: 4 mmol/L (ref 3.5–5.1)
Sodium: 140 mmol/L (ref 135–145)

## 2018-05-30 LAB — CBC
HEMATOCRIT: 26.6 % — AB (ref 39.0–52.0)
Hemoglobin: 8.3 g/dL — ABNORMAL LOW (ref 13.0–17.0)
MCH: 32.8 pg (ref 26.0–34.0)
MCHC: 31.2 g/dL (ref 30.0–36.0)
MCV: 105.1 fL — ABNORMAL HIGH (ref 80.0–100.0)
Platelets: 334 10*3/uL (ref 150–400)
RBC: 2.53 MIL/uL — ABNORMAL LOW (ref 4.22–5.81)
RDW: 14.4 % (ref 11.5–15.5)
WBC: 13.2 10*3/uL — AB (ref 4.0–10.5)
nRBC: 0 % (ref 0.0–0.2)

## 2018-05-30 LAB — PHOSPHORUS: Phosphorus: 2.4 mg/dL — ABNORMAL LOW (ref 2.5–4.6)

## 2018-05-30 LAB — MAGNESIUM: Magnesium: 1.9 mg/dL (ref 1.7–2.4)

## 2018-05-30 MED ORDER — FUROSEMIDE 10 MG/ML PO SOLN
20.0000 mg | Freq: Every day | ORAL | Status: DC
  Administered 2018-05-30 – 2018-06-01 (×3): 20 mg
  Filled 2018-05-30 (×3): qty 2

## 2018-05-30 MED ORDER — MIDODRINE HCL 5 MG PO TABS
5.0000 mg | ORAL_TABLET | Freq: Three times a day (TID) | ORAL | Status: DC
  Administered 2018-05-30 – 2018-05-31 (×3): 5 mg via ORAL
  Filled 2018-05-30 (×4): qty 1

## 2018-05-30 MED ORDER — FENTANYL CITRATE (PF) 100 MCG/2ML IJ SOLN
50.0000 ug | INTRAMUSCULAR | Status: DC | PRN
  Administered 2018-05-31 (×3): 50 ug via INTRAVENOUS
  Filled 2018-05-30 (×3): qty 2

## 2018-05-30 MED ORDER — POTASSIUM CHLORIDE 20 MEQ PO PACK
40.0000 meq | PACK | Freq: Two times a day (BID) | ORAL | Status: AC
  Administered 2018-05-30 – 2018-05-31 (×3): 40 meq via ORAL
  Filled 2018-05-30 (×3): qty 2

## 2018-05-30 MED ORDER — CHLORHEXIDINE GLUCONATE CLOTH 2 % EX PADS
6.0000 | MEDICATED_PAD | Freq: Every day | CUTANEOUS | Status: DC
  Administered 2018-05-31 – 2018-06-01 (×2): 6 via TOPICAL

## 2018-05-30 NOTE — Care Management Note (Addendum)
Case Management Note  Patient Details  Name: Charles Hatfield MRN: [id number] Date of Birth: 1940-10-07  Subjective/Objective:     Pt admitted with AMS               Action/Plan:  PTA from home - son is the support person.  Pt trached/peg on 12/4 - pt remains on ventilator and on pressors.  CM received LTACH referral during LOS meeting - attending in agreement.  CM spoke with pts son on the phone and provided [name] choice as in alignment with medicare.gov guidelines.  Son is in agreement for both facilities to reach out to him   Expected Discharge Date:                  Expected Discharge Plan:  Long Term Acute Care (LTAC)  In-House Referral:  Clinical Social Work  Discharge planning Services  CM Consult  Post Acute Care Choice:    Choice offered to:     DME Arranged:    DME Agency:     HH Arranged:    HH Agency:     Status of Service:     If discussed at [employer], dates discussed:    Additional Comments: 05/31/2018 PTs son toured [facility] today and plans to tour [name] this evening.  Pts Son to call to unit to inform of choice - CM left handoff for weekend CM to follow up with choice selection.  Both facilities can offer bed - simply waiting on pts choice.  Cherylann Parr, RN 05/30/2018, 4:20 PM

## 2018-05-30 NOTE — Progress Notes (Addendum)
NAME:  Charles Hatfield, MRN:  [id number], DOB:  Oct 01, 1940, LOS: 9 ADMISSION DATE:  05/21/2018, CONSULTATION DATE:  05/21/2018 REFERRING MD:  Donnald Garre, CHIEF COMPLAINT:  Black "coffee ground like" emesis   Brief History   77 yo male brought to ER with altered mental status, vomiting black material, hypotension, hypoxia.  Intubated in ER.  Concern for aspiration pneumonia and upper GI bleeding.  Significant Hospital Events   11/26 - admission and intubation 11/28: Off pressors.  Blood growing gram-positive cocci 2/4 coag neg staph  11/29 cultures neg for MRSA. Vanc stopped.  11/30: passing SBT. Discussed high risk for re-intubation w/ son.  Cont abx. Will need NG small bore tube, failed SBT after prolonged trial  12/1: Failed spontaneous breathing trial profoundly weak long discussion with son.  Deciding now on one-way extubation in the next 24-48 hrs., with plan to likely transition to comfort if indicated. 12/2: continues to fail weaning trials due to profound weakness and deconditioning  Consults:  11/27 GI consult  Procedures:  ETT 11/26>>> Upper Endoscopy 11/27>>> Tracheostomy by Jefm Miles 05/29/2018 PEG tube by Lindie Spruce 05/29/2018  Significant Diagnostic Tests:  CT abd/pelvis 11/26 >> nephrolithiasis. CT chest 11.26 >> bilateral airspace disease R>L Echo 11/26 >> EF is 40 to 45%, inferior and lateral hypokinesis.  No vegetation. CT head 11/26 >> atrophy and old strokes  Micro Data:  Blood 11/26 >> S epidermidis, S. Hominis Urine 11/26 >> E. coli Sputum 11/26 >> pending  Antimicrobials:  Zosyn 11/26 >> 11/27 Vancomycin 11/27 >>11/29 Unasyn 11/27 >>12/4   Interim history/subjective:  Status post tracheostomy tube and PEG tube placement yesterday by critical care and trauma surgery.  No issues overnight.  Discussed care with case management this morning about potential transition to LTAC.  Blood pressure requiring low-dose Levophed overnight.  Objective   Blood pressure  (!) 104/38, pulse 63, temperature (!) 100.8 F (38.2 C), resp. rate 19, height 5\' 7"  (1.702 m), weight 59.9 kg, SpO2 100 %. CVP:  [5 mmHg-8 mmHg] 5 mmHg  Vent Mode: PRVC FiO2 (%):  [30 %] 30 % Set Rate:  [14 bmp] 14 bmp Vt Set:  [520 mL] 520 mL PEEP:  [5 cmH20] 5 cmH20 Plateau Pressure:  [17 cmH20-24 cmH20] 17 cmH20   Intake/Output Summary (Last 24 hours) at 05/30/2018 1059 Last data filed at 05/30/2018 0900 Gross per 24 hour  Intake 2045.45 ml  Output 875 ml  Net 1170.45 ml   Filed Weights   05/29/18 0447 05/29/18 1033 05/30/18 0410  Weight: 58.2 kg 60.7 kg 59.9 kg    Examination: General appearance: 77 y.o., male, alert to voice, appears chronically debilitated Eyes: anicteric sclerae,  dry tracking appropriately HENT: NCAT; oropharynx, mucous membranes Neck: Trachea midline tracheostomy tube in place, no evidence of bleeding, CDI, right IJ central line Lungs: Bilateral ventilated breath sounds CV: RRR, S1, S2, no MRGs  Abdomen: Soft, non-tender; non-distended, BS present, abdominal binder in place after PEG placement Extremities: Dependent edema , radial and DP pulses present bilaterally  Skin: Weeping edema in the upper extremities Psych: Appropriate affect Neuro: Left grip strength weak   Resolved Hospital Problem list    Assessment & Plan:   Acute respiratory failure with hypoxia with failure to wean in the setting of profound deconditioning and malnutrition Failed weaning trials, decision made for tracheostomy and PEG tube placement per son's wishes Plan At this point will likely need prolonged ventilator support. Holding all sedating medications Likely good candidate for LTAC  Encephalopathy of  unclear etiology, acute illness on background of microvascular disease -He is profoundly deconditioned Plan Holding all mood altering and sedating medications  Aspiration pneumonia. Mixed culture of CoNS possible contaminant E. coli urinary tract infection, on and off  dose vasopressor overnight Increasing leukocytosis will need to continue to observe Plan Stop date on antibiotics completed yesterday for Unasyn No additional need for antimicrobials Will add midodrine 5 mg 3 times daily  Upper GI bleeding. Plan Continue Protonix 40 mg twice daily  Acute renal failure from ATN ->improving Continue to observe with BMET  Positive cumulative fluid balance with evidence of third spacing and interstitial edema Hx of CAD, h/o of LV mural thrombus, ischemic CM.  -f/u echo w/ EF 40-45%, no thrombus at this point.  Plan Holding anticoagulation at this time due to UGI bleeding on admission  We will start daily oral Lasix regimen Holding beta-blocker due to current low-dose vasopressor need  Hypotension, likely multifactorial with no obvious source of sepsis We will start midodrine 5 mg 3 times daily Will attempt to titrate Levophed off Try to maintain map greater than 60  Severe protein calorie malnutrition. Plan Continue tube feeds at goal  Palliative encounter: Plan: This was discussed with patient prior to tracheostomy tube placement by team yesterday.  History of CVA, left-sided weakness.  Laboratory results reviewed in epic results reviewed tab.  This patient is critically ill with multiple organ system failure; which, requires frequent high complexity decision making, assessment, support, evaluation, and titration of therapies. This was completed through the application of advanced monitoring technologies and extensive interpretation of multiple databases. During this encounter critical care time was devoted to patient care services described in this note for 36 minutes.   Josephine Igo, DO [facility] 05/30/2018 11:00 AM  Personal pager: [phone] If unanswered, please page CCM On-call: [phone]

## 2018-05-30 NOTE — Progress Notes (Signed)
SLP Cancellation Note  Patient Details Name: Charles Hatfield MRN: [id number] DOB: 05/30/1941   Cancelled treatment:     Attempted to see pt for swallowing and PMV evaluation.  Pt on vent at time of attempt. Per RN, pt failed SBT this morning, with no plans for further TCT today.  SLP will continue to follow and will assess pt when medically appropriate  Cayla Wiegand E Soyla Bainter 05/30/2018, 10:30 AM

## 2018-05-30 NOTE — Op Note (Signed)
[facility] Patient Name: Charles Hatfield Procedure Date : 05/29/2018 MRN: [id number] Attending MD: Kathrin Ruddy, MD Date of Birth: 06-Jul-1940 CSN: [id number] Age: 77 Admit Type: Inpatient Procedure:                Upper GI endoscopy Indications:              Place PEG because patient is unable to eat, Place                            PEG due to impaired swallowing, Place PEG because                            patient requires ventilator support, Place PEG to                            improve nutrition in patient with prolonged severe                            illness Providers:                Jimmye Norman, MD, Arlee Muslim, Technician Referring MD:              Medicines:                None Complications:            No immediate complications. Estimated Blood Loss:      Procedure:                Pre-Anesthesia Assessment:                           - Prior to the procedure, a History and Physical                            was performed, and patient medications and                            allergies were reviewed. The patient is unable to                            give consent secondary to the patient's altered                            mental status. The risks and benefits of the                            procedure and the sedation options and risks were                            discussed with the patient's sibling and sister.                            All questions were answered and informed consent  was obtained. Patient identification and proposed                            procedure were verified by the physician and the                            nurse Patient's Room. Mental Status Examination:                            sedated. Airway Examination: normal oropharyngeal                            airway and neck mobility, tracheostomy via                            ventilator and status post tracheostomy.           Respiratory Examination: clear to auscultation. CV                            Examination: RRR, no murmurs, no S3 or S4. ASA                            Grade Assessment: IV - A patient with severe                            systemic disease that is a constant threat to life.                            After reviewing the risks and benefits, the patient                            was deemed in satisfactory condition to undergo the                            procedure. The anesthesia plan was to use no                            sedation or anesthesia. Immediately prior to                            administration of medications, the patient was                            re-assessed for adequacy to receive sedatives. The                            heart rate, respiratory rate, oxygen saturations,                            blood pressure, adequacy of pulmonary ventilation,                            and response to care were monitored throughout the  procedure. The physical status of the patient was                            re-assessed after the procedure.                           After obtaining informed consent, the endoscope was                            passed under direct vision. Throughout the                            procedure, the patient's blood pressure, pulse, and                            oxygen saturations were monitored continuously. The                            GIF-H190 ([id number]) Olympus Adult EGD was introduced                            through the mouth, and advanced to the pylorus. The                            upper GI endoscopy was accomplished without                            difficulty. The patient tolerated the procedure                            well. The total duration of the procedure was 13                            minutes. Scope In: Scope Out: Findings:      One benign-appearing, intrinsic moderate stenosis was  found 24 cm from       the incisors. This stenosis measured 8 mm (inner diameter) x 3 cm (in       length). The stenosis was traversed.      The entire examined stomach was normal. The patient was placed in the       supine position for PEG placement. The stomach was insufflated to appose       gastric and abdominal walls. A site was located in the body of the       stomach with excellent manual external pressure for placement. The       abdominal wall was marked and prepped in a sterile manner. The area was       anesthetized with 4 mL of 1%/1:100,000 lidocaine/epinephrine. The trocar       needle was introduced through the abdominal wall and into the stomach       under direct endoscopic view. A snare was introduced through the       endoscope and opened in the gastric lumen. The guide wire was passed       through the trocar and into the open snare. The snare was closed around  the guide wire. The endoscope and snare were removed, pulling the wire       out through the mouth. A skin incision was made at the site of needle       insertion. The externally removable 24 Fr EndoVive Safety gastrostomy       tube was lubricated. The G-tube was tied to the guide wire and pulled       through the mouth and into the stomach. The trocar needle was removed,       and the gastrostomy tube was pulled out from the stomach through the       skin. The external bumper was attached to the gastrostomy tube, and the       tube was cut to remove the guide wire. The final position of the       gastrostomy tube was confirmed by relook endoscopy, and skin marking       noted to be 4 cm at the external bumper. The final tension and       compression of the abdominal wall by the PEG tube and external bumper       were checked and revealed that the bumper was moderately tight and       mildly deforming the skin. The feeding tube was capped, and the tube       site cleaned and dressed.      The examined  duodenum was normal. Impression:               - Benign-appearing esophageal stenosis.                           - Normal stomach.                           - Normal examined duodenum.                           - An externally removable PEG placement was                            successfully completed.                           - No specimens collected. Recommendation:           - Please follow the post-PEG recommendations                            including: may use PEG today for meds and water,                            use PEG today after checked by physician and start                            using PEG today. Procedure Code(s):        --- Professional ---                           [phone], 52, Esophagogastroduodenoscopy, flexible,  transoral; with directed placement of percutaneous                            gastrostomy tube Diagnosis Code(s):        --- Professional ---                           R63.3, Feeding difficulties                           R13.10, Dysphagia, unspecified                           Z99.11, Dependence on respirator [ventilator] status                           Z43.1, Encounter for attention to gastrostomy                           E63.9, Nutritional deficiency, unspecified CPT copyright 2018 [employer]. All rights reserved. The codes documented in this report are preliminary and upon coder review may  be revised to meet current compliance requirements. Kathrin Ruddy, MD Jimmye Norman III, MD 05/29/2018 3:17:26 PM This report has been signed electronically. Number of Addenda: 0

## 2018-05-30 NOTE — Progress Notes (Signed)
RT note: attempted daily SBT on patient of CPAP/PSV of 15/5.  Once placed on CPAP/PSV, patient's respiratory rate began to increase to 40s.  Placed patient back on full support ventilation.  Will continue to monitor.

## 2018-05-30 NOTE — Evaluation (Signed)
Physical Therapy Evaluation Patient Details Name: Charles Hatfield MRN: [id number] DOB: 09-22-1940 Today's Date: 05/30/2018   History of Present Illness  77 yo male brought to ER with altered mental status, vomiting black material, hypotension, hypoxia.  Intubated in ER.  Concern for aspiration pneumonia and upper GI bleeding. PMH includes but not limited: Stroke w/ L sided weakness, COPD, DM, HTN, A Fib CAD, CABGx3, NSTEMI, peg placement 12/4, endarterectomy.      Clinical Impression  Pt admitted with above diagnosis. Pt currently with functional limitations due to the deficits listed below (see PT Problem List). PTA, pt living at home with son, unclear level baseline at this time as no family present. Pt total A for all mobility tonight, sitting EOB for 5 minutes before fatigue. BP soft with MAP of 63 start of session RN aware. SpO2 HR and RR all remained WNL during session. Patient will likely need LTACH and we will cont to follow to progress as able.  Pt will benefit from skilled PT to increase their independence and safety with mobility to allow discharge to the venue listed below.       Follow Up Recommendations LTACH    Equipment Recommendations  None recommended by PT    Recommendations for Other Services       Precautions / Restrictions Precautions Precautions: Fall Restrictions Weight Bearing Restrictions: No      Mobility  Bed Mobility Overal bed mobility: Needs Assistance Bed Mobility: Supine to Sit;Sit to Supine     Supine to sit: Total assist;+2 for physical assistance Sit to supine: Total assist;+2 for physical assistance   General bed mobility comments: tolerating sitting EOB for 5 minutes before fatigue and discomfort.   Transfers                 General transfer comment: unable  Ambulation/Gait                Stairs            Wheelchair Mobility    Modified Rankin (Stroke Patients Only)       Balance                                             Pertinent Vitals/Pain Pain Assessment: Faces Faces Pain Scale: Hurts little more Pain Location: trach Pain Descriptors / Indicators: Discomfort    Home Living Family/patient expects to be discharged to:: Skilled nursing facility Living Arrangements: Children                    Prior Function           Comments: pt unable to provide history, living with son per chart.      Hand Dominance        Extremity/Trunk Assessment   Upper Extremity Assessment Upper Extremity Assessment: Generalized weakness(L sided weakness hx of stroke. )    Lower Extremity Assessment Lower Extremity Assessment: (L sided weakness hx of stroke, R side 1/5)       Communication      Cognition Arousal/Alertness: Awake/alert   Overall Cognitive Status: Difficult to assess                                        General Comments General comments (skin integrity,  edema, etc.): Vent: PRVC RR 23 30% PEEP 5 HR 86 98^^ bp 113/43 (63)    Exercises     Assessment/Plan    PT Assessment Patient needs continued PT services  PT Problem List Decreased strength;Cardiopulmonary status limiting activity;Decreased activity tolerance;Decreased range of motion;Decreased balance       PT Treatment Interventions DME instruction;Gait training;Stair training;Functional mobility training;Therapeutic activities;Therapeutic exercise    PT Goals (Current goals can be found in the Care Plan section)  Acute Rehab PT Goals Patient Stated Goal: non stated PT Goal Formulation: With patient Time For Goal Achievement: 06/13/18 Potential to Achieve Goals: Poor    Frequency Min 1X/week   Barriers to discharge        Co-evaluation               AM-PAC PT "6 Clicks" Mobility  Outcome Measure Help needed turning from your back to your side while in a flat bed without using bedrails?: Total Help needed moving from lying on your back to sitting  on the side of a flat bed without using bedrails?: Total Help needed moving to and from a bed to a chair (including a wheelchair)?: Total Help needed standing up from a chair using your arms (e.g., wheelchair or bedside chair)?: Total Help needed to walk in hospital room?: Total Help needed climbing 3-5 steps with a railing? : Total 6 Click Score: 6    End of Session Equipment Utilized During Treatment: Oxygen(vent) Activity Tolerance: Patient limited by fatigue Patient left: in bed;with call bell/phone within reach;with nursing/sitter in room Nurse Communication: Mobility status;Need for lift equipment PT Visit Diagnosis: Unsteadiness on feet (R26.81)    Time: 0454-0981 PT Time Calculation (min) (ACUTE ONLY): 25 min   Charges:   PT Evaluation $PT Eval High Complexity: 1 High PT Treatments $Therapeutic Activity: 8-22 mins        Etta Grandchild, PT, DPT Acute Rehabilitation Services Pager: [phone] Office: [phone]    Etta Grandchild 05/30/2018, 6:51 PM

## 2018-05-30 NOTE — Progress Notes (Signed)
[facility] Progress Note  1 Day Post-Op  Subjective: CC:  Trach on the vent. Not communicating.  Low grade temp this AM  Objective: Vital signs in last 24 hours: Temp:  [99 F (37.2 C)-100.8 F (38.2 C)] 100.4 F (38 C) (12/05 0630) Pulse Rate:  [57-88] 79 (12/05 0630) Resp:  [14-37] 17 (12/05 0630) BP: (86-139)/(35-65) 117/45 (12/05 0630) SpO2:  [87 %-100 %] 97 % (12/05 0630) FiO2 (%):  [30 %] 30 % (12/05 0400) Weight:  [59.9 kg-60.7 kg] 59.9 kg (12/05 0410) Last BM Date: 05/26/18  Intake/Output from previous day: 12/04 0701 - 12/05 0700 In: 1776.8 [I.V.:351.7; NG/GT:925; IV Piggyback:500.1] Out: 875 [Urine:875] Intake/Output this shift: No intake/output data recorded.  PE: Gen:  Alert, NAD, chronically ill appearing Pulm:  Ventilated respirations Abd: abd binder in place, Soft, non-tender, PEG site clean and dry, TF tunning at goal  Lab Results:  Recent Labs    05/29/18 0859 05/30/18 0405  WBC 8.2 13.2*  HGB 7.3* 8.3*  HCT 22.4* 26.6*  PLT 224 334   BMET Recent Labs    05/29/18 0859 05/30/18 0405  NA 141 140  K 3.2* 4.0  CL 105 106  CO2 26 23  GLUCOSE 116* 202*  BUN 20 21  CREATININE 1.26* 1.02  CALCIUM 7.4* 7.5*   PT/INR Recent Labs    05/29/18 1109 05/29/18 1235  LABPROT 16.6* 16.1*  INR 1.36 1.31   CMP     Component Value Date/Time   NA 140 05/30/2018 0405   K 4.0 05/30/2018 0405   CL 106 05/30/2018 0405   CO2 23 05/30/2018 0405   GLUCOSE 202 (H) 05/30/2018 0405   BUN 21 05/30/2018 0405   CREATININE 1.02 05/30/2018 0405   CREATININE 1.23 06/10/2014 1518   CALCIUM 7.5 (L) 05/30/2018 0405   PROT 4.6 (L) 05/28/2018 1514   ALBUMIN 1.6 (L) 05/28/2018 1514   AST 86 (H) 05/28/2018 1514   ALT 58 (H) 05/28/2018 1514   ALKPHOS 59 05/28/2018 1514   BILITOT 0.6 05/28/2018 1514   GFRNONAA >60 05/30/2018 0405   GFRAA >60 05/30/2018 0405   Lipase     Component Value Date/Time   LIPASE 18 05/21/2018 1316        Studies/Results: Dg Chest Port 1 View  Result Date: 05/29/2018 CLINICAL DATA:  Status post tracheostomy. EXAM: PORTABLE CHEST 1 VIEW COMPARISON:  05/29/2018 at 0508 hours FINDINGS: Endotracheal and enteric tubes have been removed. A new tracheostomy tube overlies the airway. A right jugular catheter terminates over the mid SVC. Sequelae of CABG are again identified. The cardiac silhouette is normal in size. Aortic atherosclerosis is noted. There are persistent patchy bibasilar airspace opacities with increased obscuration of the left and now right hemidiaphragms. No pneumothorax is identified. IMPRESSION: 1. Interval tracheostomy. 2. Persistent bibasilar airspace disease with possible small pleural effusions bilaterally. Electronically Signed   By: Sebastian Ache M.D.   On: 05/29/2018 16:04   Dg Chest Port 1 View  Result Date: 05/29/2018 CLINICAL DATA:  Intubated. EXAM: PORTABLE CHEST 1 VIEW COMPARISON:  05/27/2018 FINDINGS: Endotracheal tube terminates 5.5 cm above the carina. Right jugular catheter terminates over the mid SVC. Enteric tube courses into the upper abdomen with tip not imaged. Sequelae of CABG are again identified. Patchy bibasilar airspace opacities are unchanged. The left hemidiaphragm remains partially obscured. No sizable pleural effusion or pneumothorax is identified. IMPRESSION: Unchanged bibasilar airspace disease. Electronically Signed   By: Sebastian Ache M.D.   On: 05/29/2018 07:13  Anti-infectives: Anti-infectives (From admission, onward)   Start     Dose/Rate Route Frequency Ordered Stop   05/25/18 1700  Ampicillin-Sulbactam (UNASYN) 3 g in sodium chloride 0.9 % 100 mL IVPB     3 g 200 mL/hr over 30 Minutes Intravenous Every 8 hours 05/25/18 1001 05/29/18 1842   05/24/18 2000  ampicillin-sulbactam (UNASYN) 1.5 g in sodium chloride 0.9 % 100 mL IVPB  Status:  Discontinued     1.5 g 200 mL/hr over 30 Minutes Intravenous Every 12 hours 05/24/18 1042 05/25/18 1001    05/23/18 1600  vancomycin (VANCOCIN) IVPB 750 mg/150 ml premix  Status:  Discontinued     750 mg 150 mL/hr over 60 Minutes Intravenous Every 48 hours 05/21/18 1524 05/21/18 1525   05/23/18 1000  vancomycin (VANCOCIN) IVPB 750 mg/150 ml premix  Status:  Discontinued     750 mg 150 mL/hr over 60 Minutes Intravenous Every 24 hours 05/22/18 0557 05/22/18 1757   05/22/18 2000  ampicillin-sulbactam (UNASYN) 1.5 g in sodium chloride 0.9 % 100 mL IVPB  Status:  Discontinued     1.5 g 200 mL/hr over 30 Minutes Intravenous Every 12 hours 05/22/18 1218 05/24/18 1042   05/22/18 1500  ceFEPIme (MAXIPIME) 1 g in sodium chloride 0.9 % 100 mL IVPB  Status:  Discontinued     1 g 200 mL/hr over 30 Minutes Intravenous Every 24 hours 05/21/18 1524 05/21/18 1525   05/22/18 0800  piperacillin-tazobactam (ZOSYN) IVPB 3.375 g  Status:  Discontinued     3.375 g 12.5 mL/hr over 240 Minutes Intravenous Every 8 hours 05/22/18 0549 05/22/18 1218   05/22/18 0630  vancomycin (VANCOCIN) IVPB 1000 mg/200 mL premix     1,000 mg 200 mL/hr over 60 Minutes Intravenous  Once 05/22/18 0557 05/22/18 0728   05/21/18 1800  piperacillin-tazobactam (ZOSYN) IVPB 2.25 g  Status:  Discontinued     2.25 g 100 mL/hr over 30 Minutes Intravenous Every 8 hours 05/21/18 1535 05/22/18 0547   05/21/18 1445  ceFEPIme (MAXIPIME) 2 g in sodium chloride 0.9 % 100 mL IVPB  Status:  Discontinued     2 g 200 mL/hr over 30 Minutes Intravenous  Once 05/21/18 1444 05/21/18 1540   05/21/18 1445  metroNIDAZOLE (FLAGYL) IVPB 500 mg  Status:  Discontinued     500 mg 100 mL/hr over 60 Minutes Intravenous Every 8 hours 05/21/18 1444 05/21/18 1525   05/21/18 1445  vancomycin (VANCOCIN) IVPB 1000 mg/200 mL premix  Status:  Discontinued     1,000 mg 200 mL/hr over 60 Minutes Intravenous  Once 05/21/18 1444 05/21/18 1532      Assessment/Plan CAD s/p CABG LV mural thrombus Ischemic CM DM HLD COPD H/o CVA Aspiration pneumonia E coli  UTI AKI Acute respiratory failure with hypoxia and failure to wean from the ventilator - trach placed 12/3  Dysphagia S/P PEG 12/4 - tolerating TF at goal, trauma will sign off, call as needed  ID - unasyn 11/25>> VTE - SCDs FEN - free water, TF @ 65 cc/hr Foley - in place    LOS: 9 days    Hosie Spangle, [facility] Surgery Pager: [phone]

## 2018-05-31 DIAGNOSIS — J9621 Acute and chronic respiratory failure with hypoxia: Secondary | ICD-10-CM

## 2018-05-31 DIAGNOSIS — D72829 Elevated white blood cell count, unspecified: Secondary | ICD-10-CM

## 2018-05-31 LAB — CBC WITH DIFFERENTIAL/PLATELET
ABS IMMATURE GRANULOCYTES: 0.27 10*3/uL — AB (ref 0.00–0.07)
Basophils Absolute: 0 10*3/uL (ref 0.0–0.1)
Basophils Relative: 0 %
Eosinophils Absolute: 0.2 10*3/uL (ref 0.0–0.5)
Eosinophils Relative: 2 %
HCT: 26.3 % — ABNORMAL LOW (ref 39.0–52.0)
HEMOGLOBIN: 8.4 g/dL — AB (ref 13.0–17.0)
Immature Granulocytes: 2 %
LYMPHS PCT: 9 %
Lymphs Abs: 1.3 10*3/uL (ref 0.7–4.0)
MCH: 33.5 pg (ref 26.0–34.0)
MCHC: 31.9 g/dL (ref 30.0–36.0)
MCV: 104.8 fL — ABNORMAL HIGH (ref 80.0–100.0)
Monocytes Absolute: 0.6 10*3/uL (ref 0.1–1.0)
Monocytes Relative: 4 %
NEUTROS ABS: 12.5 10*3/uL — AB (ref 1.7–7.7)
Neutrophils Relative %: 83 %
Platelets: 404 10*3/uL — ABNORMAL HIGH (ref 150–400)
RBC: 2.51 MIL/uL — ABNORMAL LOW (ref 4.22–5.81)
RDW: 14.5 % (ref 11.5–15.5)
WBC: 15 10*3/uL — ABNORMAL HIGH (ref 4.0–10.5)
nRBC: 0 % (ref 0.0–0.2)

## 2018-05-31 LAB — COMPREHENSIVE METABOLIC PANEL
ALT: 46 U/L — ABNORMAL HIGH (ref 0–44)
AST: 59 U/L — ABNORMAL HIGH (ref 15–41)
Albumin: 1.7 g/dL — ABNORMAL LOW (ref 3.5–5.0)
Alkaline Phosphatase: 63 U/L (ref 38–126)
Anion gap: 10 (ref 5–15)
BUN: 20 mg/dL (ref 8–23)
CO2: 23 mmol/L (ref 22–32)
Calcium: 7.6 mg/dL — ABNORMAL LOW (ref 8.9–10.3)
Chloride: 104 mmol/L (ref 98–111)
Creatinine, Ser: 1.02 mg/dL (ref 0.61–1.24)
GFR calc Af Amer: >60 mL/min (ref >60)
GFR calc non Af Amer: >60 mL/min (ref >60)
Glucose, Bld: 209 mg/dL — ABNORMAL HIGH (ref 70–99)
Potassium: 4.6 mmol/L (ref 3.5–5.1)
Sodium: 137 mmol/L (ref 135–145)
Total Bilirubin: 0.5 mg/dL (ref 0.3–1.2)
Total Protein: 5 g/dL — ABNORMAL LOW (ref 6.5–8.1)

## 2018-05-31 LAB — GLUCOSE, CAPILLARY
Glucose-Capillary: 151 mg/dL — ABNORMAL HIGH (ref 70–99)
Glucose-Capillary: 214 mg/dL — ABNORMAL HIGH (ref 70–99)
Glucose-Capillary: 114 mg/dL — ABNORMAL HIGH (ref 70–99)
Glucose-Capillary: 130 mg/dL — ABNORMAL HIGH (ref 70–99)
Glucose-Capillary: 136 mg/dL — ABNORMAL HIGH (ref 70–99)

## 2018-05-31 LAB — MAGNESIUM: Magnesium: 1.9 mg/dL (ref 1.7–2.4)

## 2018-05-31 MED ORDER — MIDODRINE HCL 5 MG PO TABS
10.0000 mg | ORAL_TABLET | Freq: Three times a day (TID) | ORAL | Status: DC
  Administered 2018-05-31 – 2018-06-01 (×4): 10 mg via ORAL
  Filled 2018-05-31 (×4): qty 2

## 2018-05-31 MED ORDER — SODIUM CHLORIDE 0.9 % IV SOLN
2.0000 g | Freq: Two times a day (BID) | INTRAVENOUS | Status: DC
  Administered 2018-05-31 – 2018-06-01 (×3): 2 g via INTRAVENOUS
  Filled 2018-05-31 (×4): qty 2

## 2018-05-31 NOTE — Progress Notes (Signed)
SLP Cancellation Note  Patient Details Name: Charles Hatfield MRN: [id number] DOB: 1940-11-06   Cancelled treatment:       Reason Eval/Treat Not Completed: Medical issues which prohibited therapy   Ahmod Gillespie, Riley Nearing 05/31/2018, 8:23 AM

## 2018-05-31 NOTE — Progress Notes (Signed)
eLink Physician-Brief Progress Note Patient Name: Charles Hatfield DOB: Nov 23, 1940 MRN: [id number]   Date of Service  05/31/2018  HPI/Events of Note  No AM labs ordered  eICU Interventions  CBC with Diff, CMET, Mg ++ ordered        Migdalia Dk 05/31/2018, 5:30 AM

## 2018-05-31 NOTE — Care Management Important Message (Signed)
Important Message  Patient Details  Name: Charles Hatfield MRN: [id number] Date of Birth: 23-Apr-1941   Medicare Important Message Given:  Yes Due to illness, patient unable to sign. Unsigned copy left in patient room at bedside.   Kairen Hallinan P Matina Rodier 05/31/2018, 3:49 PM

## 2018-05-31 NOTE — Progress Notes (Addendum)
CSW continues to follow pt for further placement needs. CSW aware that pt is now being recommended for LTACH. Per RNCM note pt's son has already been contacted regarding this. CSW will continue to  follow in the event that pt disposition changes and pt becomes in need of vent/snf placement.    Claude Manges Kahli Mayon, MSW, LCSW-A Emergency Department Clinical Social Worker [phone]

## 2018-05-31 NOTE — Progress Notes (Signed)
Pt placed back on full vent support due to increased RR 

## 2018-05-31 NOTE — Progress Notes (Signed)
NAME:  Charles Hatfield, MRN:  [id number], DOB:  08/03/1940, LOS: 10 ADMISSION DATE:  05/21/2018, CONSULTATION DATE:  05/21/2018 REFERRING MD:  Donnald Garre, CHIEF COMPLAINT:  Black "coffee ground like" emesis   Brief History   77 yo male brought to ER with altered mental status, vomiting black material, hypotension, hypoxia.  Intubated in ER.  Concern for aspiration pneumonia and upper GI bleeding.  Significant Hospital Events   11/26 - admission and intubation 11/28: Off pressors.  Blood growing gram-positive cocci 2/4 coag neg staph  11/29 cultures neg for MRSA. Vanc stopped.  11/30: passing SBT. Discussed high risk for re-intubation w/ son.  Cont abx. Will need NG small bore tube, failed SBT after prolonged trial  12/1: Failed spontaneous breathing trial profoundly weak long discussion with son.  Deciding now on one-way extubation in the next 24-48 hrs., with plan to likely transition to comfort if indicated. 12/2: continues to fail weaning trials due to profound weakness and deconditioning  Consults:  11/27 GI consult  Procedures:  ETT 11/26>>> Upper Endoscopy 11/27>>> Tracheostomy by Jefm Miles 05/29/2018 PEG tube by Lindie Spruce 05/29/2018  Significant Diagnostic Tests:  CT abd/pelvis 11/26 >> nephrolithiasis. CT chest 11.26 >> bilateral airspace disease R>L Echo 11/26 >> EF is 40 to 45%, inferior and lateral hypokinesis.  No vegetation. CT head 11/26 >> atrophy and old strokes  Micro Data:  Blood 11/26 >> S epidermidis, S. Hominis Urine 11/26 >> E. coli Sputum 11/26 >> pending  Antimicrobials:  Zosyn 11/26 >> 11/27 Vancomycin 11/27 >>11/29 Unasyn 11/27 >>12/4   Interim history/subjective:  Few increases in blood pressure overnight.  Still low-grade temperature.  Central venous line has been in 20+ days as well as Foley.  This was discussed with nursing staff.  Unsure if blood pressure readings are very accurate on cuff pressures.  Objective   Blood pressure (!) 110/37,  pulse 64, temperature (!) 100.8 F (38.2 C), resp. rate (!) 25, height 5\' 7"  (1.702 m), weight 63.1 kg, SpO2 100 %. CVP:  [4 mmHg-6 mmHg] 6 mmHg  Vent Mode: PRVC FiO2 (%):  [30 %] 30 % Set Rate:  [14 bmp] 14 bmp Vt Set:  [520 mL] 520 mL PEEP:  [5 cmH20] 5 cmH20 Pressure Support:  [16 cmH20] 16 cmH20 Plateau Pressure:  [12 cmH20-18 cmH20] 12 cmH20   Intake/Output Summary (Last 24 hours) at 05/31/2018 1005 Last data filed at 05/31/2018 0800 Gross per 24 hour  Intake 2162.01 ml  Output 1338 ml  Net 824.01 ml   Filed Weights   05/29/18 1033 05/30/18 0410 05/31/18 0500  Weight: 60.7 kg 59.9 kg 63.1 kg    Examination: General appearance: 77 y.o., male, chronically ill-appearing Eyes: anicteric sclerae, tracking appropriately HENT: NCAT; oropharynx, because membranes dry Neck: Trachea midline, tracheostomy in place Lungs: CTAB, no crackles, no wheeze CV: RRR, S1, S2 Abdomen: Soft, non-tender; non-distended Extremities: No peripheral edema, radial and DP pulses present bilaterally  Neuro: Alert voice, will follow some basic commands, likes to retract his left leg up   Resolved Hospital Problem list    Assessment & Plan:   Acute respiratory failure with hypoxia with failure to wean in the setting of profound deconditioning and malnutrition Failed weaning trials, decision made for tracheostomy and PEG tube placement per son's wishes Plan Will likely need prolonged mechanical ventilation. We will continue weaning trials daily Once able will transition to TCT  Encephalopathy of unclear etiology, acute illness on background of microvascular disease -He is profoundly deconditioned Plan Continue  holding psychotropic medications  Aspiration pneumonia. Mixed culture of CoNS possible contaminant E. coli urinary tract infection, on and off dose vasopressor overnight Increasing leukocytosis will need to continue to observe Plan Stop date instituted for Unasyn. Still having slight  increase in leukocytosis and low-grade temperatures. We will remove all potential sources to include removal of the Foley as well as central venous catheter. Continue midodrin, increase to 10 mg 3 times daily Goal mean arterial pressure greater than 60  Upper GI bleeding. Plan Protonix 40 twice daily  Acute renal failure from ATN ->improving Continue to follow with renal function panel  Positive cumulative fluid balance with evidence of third spacing and interstitial edema Hx of CAD, h/o of LV mural thrombus, ischemic CM.  -f/u echo w/ EF 40-45%, no thrombus at this point.  Plan Holding anticoagulation due to upper GI bleeding. Low-dose Lasix Holding beta-blockade at this point as he just came off vasopressor.  Severe protein calorie malnutrition. Plan Continue tube feeds at goal  History of CVA, left-sided weakness.  Laboratory results reviewed in epic results reviewed tab.  This patient is critically ill with multiple organ system failure; which, requires frequent high complexity decision making, assessment, support, evaluation, and titration of therapies. This was completed through the application of advanced monitoring technologies and extensive interpretation of multiple databases. During this encounter critical care time was devoted to patient care services described in this note for 34 minutes.   Josephine Igo, DO [facility] 05/31/2018 11:43 AM  Personal pager: [phone] If unanswered, please page CCM On-call: #[phone]

## 2018-06-01 DIAGNOSIS — Z720 Tobacco use: Secondary | ICD-10-CM

## 2018-06-01 DIAGNOSIS — Z9889 Other specified postprocedural states: Secondary | ICD-10-CM

## 2018-06-01 DIAGNOSIS — N183 Chronic kidney disease, stage 3 (moderate): Secondary | ICD-10-CM

## 2018-06-01 DIAGNOSIS — F0150 Vascular dementia without behavioral disturbance: Secondary | ICD-10-CM

## 2018-06-01 DIAGNOSIS — Z8673 Personal history of transient ischemic attack (TIA), and cerebral infarction without residual deficits: Secondary | ICD-10-CM

## 2018-06-01 DIAGNOSIS — I255 Ischemic cardiomyopathy: Secondary | ICD-10-CM

## 2018-06-01 DIAGNOSIS — I4819 Other persistent atrial fibrillation: Secondary | ICD-10-CM

## 2018-06-01 DIAGNOSIS — Z951 Presence of aortocoronary bypass graft: Secondary | ICD-10-CM

## 2018-06-01 LAB — CBC WITH DIFFERENTIAL/PLATELET
Abs Immature Granulocytes: 0.13 10*3/uL — ABNORMAL HIGH (ref 0.00–0.07)
Basophils Absolute: 0 10*3/uL (ref 0.0–0.1)
Basophils Relative: 0 %
Eosinophils Absolute: 0.1 10*3/uL (ref 0.0–0.5)
Eosinophils Relative: 1 %
HCT: 24.1 % — ABNORMAL LOW (ref 39.0–52.0)
Hemoglobin: 7.7 g/dL — ABNORMAL LOW (ref 13.0–17.0)
Immature Granulocytes: 1 %
Lymphocytes Relative: 7 %
Lymphs Abs: 0.8 10*3/uL (ref 0.7–4.0)
MCH: 32.6 pg (ref 26.0–34.0)
MCHC: 32 g/dL (ref 30.0–36.0)
MCV: 102.1 fL — ABNORMAL HIGH (ref 80.0–100.0)
MONO ABS: 0.3 10*3/uL (ref 0.1–1.0)
Monocytes Relative: 3 %
Neutro Abs: 9.4 10*3/uL — ABNORMAL HIGH (ref 1.7–7.7)
Neutrophils Relative %: 88 %
Platelets: 345 10*3/uL (ref 150–400)
RBC: 2.36 MIL/uL — ABNORMAL LOW (ref 4.22–5.81)
RDW: 14.6 % (ref 11.5–15.5)
WBC: 10.8 10*3/uL — ABNORMAL HIGH (ref 4.0–10.5)
nRBC: 0 % (ref 0.0–0.2)

## 2018-06-01 LAB — COMPREHENSIVE METABOLIC PANEL
ALT: 60 U/L — ABNORMAL HIGH (ref 0–44)
AST: 75 U/L — ABNORMAL HIGH (ref 15–41)
Albumin: 1.7 g/dL — ABNORMAL LOW (ref 3.5–5.0)
Alkaline Phosphatase: 61 U/L (ref 38–126)
Anion gap: 11 (ref 5–15)
BUN: 23 mg/dL (ref 8–23)
CO2: 21 mmol/L — ABNORMAL LOW (ref 22–32)
Calcium: 7.8 mg/dL — ABNORMAL LOW (ref 8.9–10.3)
Chloride: 102 mmol/L (ref 98–111)
Creatinine, Ser: 0.98 mg/dL (ref 0.61–1.24)
GFR calc non Af Amer: >60 mL/min (ref >60)
Glucose, Bld: 133 mg/dL — ABNORMAL HIGH (ref 70–99)
Potassium: 4.5 mmol/L (ref 3.5–5.1)
Sodium: 134 mmol/L — ABNORMAL LOW (ref 135–145)
Total Bilirubin: 0.5 mg/dL (ref 0.3–1.2)
Total Protein: 5.2 g/dL — ABNORMAL LOW (ref 6.5–8.1)

## 2018-06-01 LAB — MAGNESIUM: Magnesium: 1.7 mg/dL (ref 1.7–2.4)

## 2018-06-01 LAB — PHOSPHORUS: Phosphorus: 3.1 mg/dL (ref 2.5–4.6)

## 2018-06-01 LAB — GLUCOSE, CAPILLARY
GLUCOSE-CAPILLARY: 138 mg/dL — AB (ref 70–99)
Glucose-Capillary: 118 mg/dL — ABNORMAL HIGH (ref 70–99)
Glucose-Capillary: 142 mg/dL — ABNORMAL HIGH (ref 70–99)
Glucose-Capillary: 152 mg/dL — ABNORMAL HIGH (ref 70–99)

## 2018-06-01 MED ORDER — ADULT MULTIVITAMIN LIQUID CH: 15.0000 mL | Freq: Every day | ORAL | 0 refills | Status: AC

## 2018-06-01 MED ORDER — FREE WATER: 200.0000 mL | Status: AC

## 2018-06-01 MED ORDER — FUROSEMIDE 10 MG/ML PO SOLN: 20.0000 mg | Freq: Every day | ORAL | 12 refills | Status: AC

## 2018-06-01 MED ORDER — MIDODRINE HCL 10 MG PO TABS: 10.0000 mg | ORAL_TABLET | Freq: Three times a day (TID) | ORAL | Status: AC

## 2018-06-01 MED ORDER — PANTOPRAZOLE SODIUM 40 MG PO PACK: 40.0000 mg | PACK | Freq: Two times a day (BID) | ORAL | 0 refills | Status: AC

## 2018-06-01 MED ORDER — INSULIN ASPART 100 UNIT/ML ~~LOC~~ SOLN: 1.0000 [IU] | SUBCUTANEOUS | 11 refills | Status: AC

## 2018-06-01 MED ORDER — VITAL AF 1.2 CAL PO LIQD: 1000.0000 mL | ORAL | Status: AC

## 2018-06-01 MED ORDER — THIAMINE HCL 100 MG PO TABS: 100.0000 mg | ORAL_TABLET | Freq: Every day | ORAL | 0 refills | Status: AC

## 2018-06-01 MED ORDER — SODIUM CHLORIDE 0.9 % IV SOLN: 2.0000 g | Freq: Two times a day (BID) | INTRAVENOUS | Status: AC

## 2018-06-01 NOTE — Progress Notes (Signed)
Pt transferred to [name] via [facility]. Report called previously to RN. Casimiro Needle, pt's son, called and informed of pt's transfer,

## 2018-06-01 NOTE — Care Management Note (Addendum)
Case Management Note  Patient Details  Name: Charles Hatfield MRN: [id number] Date of Birth: 12-29-1940  Subjective/Objective:                    Action/Plan:  Spoke w patient's son, he would like to use [name]. Elayne Snare, rep, states there is a bed available today. Accepting MD will be Yehuda Mao MD to call report to Dr Julio Sicks at [phone]. Requested Dr Tonia Brooms to fill out provider portion of Emtala.  Room 417 RN to RN report to be called to [phone] [facility] RN provided with number and will verify Emtala complete and printed and chart copied) [facility] to provide transport for Ventilated patient. Unit clerk please copy chart including any images from radiology onto disc and sent w patient.  Expected Discharge Date:                  Expected Discharge Plan:  Long Term Acute Care (LTAC)  In-House Referral:  Clinical Social Work  Discharge planning Services  CM Consult  Post Acute Care Choice:  Long Term Acute Care (LTAC) Choice offered to:  Adult Children  DME Arranged:    DME Agency:     HH Arranged:    HH Agency:     Status of Service:  Completed, signed off  If discussed at Long Length of Stay Meetings, dates discussed:    Additional Comments:  Lawerance Sabal, RN 06/01/2018, 9:35 AM

## 2018-06-01 NOTE — Progress Notes (Signed)
Spoke to pt's son about LTACH placement, and he has chosen [name] for his father to go to. Called CSW to inform of son's decision.

## 2018-06-01 NOTE — Discharge Summary (Signed)
Physician Discharge Summary   Patient ID: JADEIN THALMAN MRN: [id number] DOB/AGE: April 07, 1941 77 y.o.  Admit date: 05/21/2018 Discharge date: 06/01/2018  Admission Diagnoses: Upper GI bleeding Aspiration pneumonia Acute hypoxemic respiratory failure requiring intubation mechanical ventilation Hypotension, shock  Discharge Diagnoses:  Active Problems:   H/O ascending aorta repair   HTN (hypertension)   Tobacco abuse   DM (diabetes mellitus) (HCC)   Ischemic cardiomyopathy, EF 40-45% 2D 01/15/12   History of stroke   S/P CABG x 3     01/23/12   S/P carotid endarterectomy   Lt, 01/23/12   CKD (chronic kidney disease) stage 3, GFR 30-59 ml/min (HCC)   Hemiparesis affecting right side as late effect of cerebrovascular accident (CVA) (HCC)   Chronic systolic congestive heart failure (HCC)   Vascular dementia without behavioral disturbance (HCC)   Atrial fibrillation (HCC)   Acute respiratory failure (HCC)   Pressure injury of skin   Protein-calorie malnutrition, severe   Discharged Condition: stable, chronically debilitated on dependent mechanical ventilation with tracheostomy tube  Hospital Course:   77 yo male brought to ER with altered mental status, vomiting black material, hypotension, hypoxia. Intubated in ER. Concern for aspiration pneumonia and upper GI bleeding.  11/26 - admission and intubation 11/28: Off pressors.  Blood growing gram-positive cocci 2/4 coag neg staph  11/29 cultures neg for MRSA. Vanc stopped.  11/30: passing SBT. Discussed high risk for re-intubation w/ son.  Cont abx. Will need NG small bore tube, failed SBT after prolonged trial  12/1: Failed spontaneous breathing trial profoundly weak long discussion with son.  Deciding now on one-way extubation in the next 24-48 hrs., with plan to likely transition to comfort if indicated. 12/2: continues to fail weaning trials due to profound weakness and deconditioning 12/6: All tubes lines removed off all  pressors  Hospital Problems:   Acute respiratory failure with hypoxia with failure to wean in the setting of profound deconditioning and malnutrition Failed weaning trials, decision made for tracheostomy and PEG tube placement per son's wishes Recommend: Prolonged weaning trials and attempts at TCT as tolerated  Encephalopathy of unclear etiology, acute illness on background of microvascular disease -He is profoundly deconditioned Plan All home psychotropic medications and sedating medications were held  Aspiration pneumonia. Mixed culture of coag negative staph possible contaminant E. coli urinary tract infection Required low-dose vasopressor for some time.  Has been off vasopressors for greater than 36 hours. Plan Has required midodrine dosing. Was on low-dose vasopressors for several days with difficulty weaning. Would complete additional 5 days of cefepime All ongoing potential sources of infection to include central line and Foley were DC'd.  Repeat cultures all negative.  Upper GI bleeding. Commend continuation of Protonix 40 mg twice daily   Acute renal failure from ATN, proved  Positive cumulative fluid balance with evidence of third spacing and interstitial edema Hx of CAD, h/o of LV mural thrombus, ischemic CM.  -f/u echo w/ EF 40-45%, no thrombus at this point.  Recommend readdressing need for anticoagulation.  However this was held due to upper GI bleeding. We did restart low-dose baby aspirin.   Currently on daily low-dose Lasix. Should reinstitute heart failure regimen as tolerated  Severe protein calorie malnutrition. Continue tube feeds at goal  History of CVA, left-sided weakness.  Consults: GI  Significant Diagnostic Studies: CT abd/pelvis 11/26 >> nephrolithiasis. CT chest 11.26 >> bilateral airspace disease R>L Echo 11/26 >> EF is 40 to 45%, inferior and lateral hypokinesis.  No vegetation. CT  head 11/26 >> atrophy and old strokes  Blood  11/26 >> S epidermidis, S. Hominis Urine 11/26 >> E. coli  Discharge Exam: Blood pressure (!) 100/47, pulse 75, temperature 99.3 F (37.4 C), temperature source Axillary, resp. rate 19, height 5\' 7"  (1.702 m), weight 61.9 kg, SpO2 99 %.  General appearance: 77 y.o., male, elderly male chronically ill-appearing Eyes: anicteric sclerae, pupils reactive, tracking appropriately HENT: NCAT; oropharynx, membranes dry Neck: Trachea midline; calcium tube in place Lungs: Lateral ventilated breath sounds CV: RRR, S1, S2, no MRGs  Abdomen: Soft, non-tender; nondistended bowel sounds present Extremities: No peripheral edema, radial and DP pulses present bilaterally  Neuro: Alert to voice, will open eyes.  Disposition: Transfer to [facility], [facility]  Accepting physician: Dr. Julio Sicks [phone]  Allergies as of 06/01/2018   No Known Allergies     Medication List    STOP taking these medications   acetaminophen 500 MG tablet Commonly known as:  TYLENOL   nicotine 7 mg/24hr patch Commonly known as:  NICODERM CQ - dosed in mg/24 hr   nitroGLYCERIN 0.4 MG SL tablet Commonly known as:  NITROSTAT   RESOURCE THICKENUP CLEAR Powd   TYLENOL PM EXTRA STRENGTH 25-500 MG Tabs tablet Generic drug:  diphenhydramine-acetaminophen     TAKE these medications   aspirin EC 81 MG tablet Take 81 mg by mouth daily.   ceFEPIme 2 g in sodium chloride 0.9 % 100 mL Inject 2 g into the vein every 12 (twelve) hours. 2gm Q12H for the next 4 days, stop date 06/05/2018   feeding supplement (VITAL AF 1.2 CAL) Liqd Place 1,000 mLs into feeding tube continuous.   free water Soln Place 200 mLs into feeding tube every 4 (four) hours.   furosemide 10 MG/ML solution Commonly known as:  LASIX Place 2 mLs (20 mg total) into feeding tube daily.   insulin aspart 100 UNIT/ML injection Commonly known as:  novoLOG Inject 1-3 Units into the skin every 4 (four) hours.   midodrine 10 MG tablet Commonly  known as:  PROAMATINE Take 1 tablet (10 mg total) by mouth every 8 (eight) hours.   multivitamin Liqd Take 15 mLs by mouth daily.   pantoprazole sodium 40 mg/20 mL Pack Commonly known as:  PROTONIX Place 20 mLs (40 mg total) into feeding tube 2 (two) times daily.   thiamine 100 MG tablet Take 1 tablet (100 mg total) by mouth daily.      36 minutes was spent arranging and coordinating discharge for this patient.  Signed: Rachel Bo Deolinda Frid 06/01/2018, 9:01 AM

## 2018-06-01 NOTE — Discharge Instructions (Signed)
How to Clean a Tracheostomy and Replace Tracheostomy Ties, Adult A tracheostomy is a surgically created opening in the trachea that is made in the front of the neck. It is important to keep a tracheostomy clean. Doing this helps you:  Keep your skin healthy.  Reduce your risk of infection.  Keep your airway secure.  Make sure to follow any specific instructions from the person's health care provider when you clean a tracheostomy and replace tracheostomy ties or a holder. Tips  If available, have a second person helping you (helper).  If tracheostomy ties are used, it is important to replace them regularly to keep them from getting damaged or dirty. How to get ready 1. Have all supplies ready and available. These include: ? Clean gloves. ? Twill tape (ties) or a manufactured tracheostomy holder. ? Scissors. ? Container to hold liquid. ? Sterile water, tap water, or 0.9% saline solution. ? Rolled-up blanket or towel. ? Sponge. ? Cotton swabs. ? 4x4 inch (10x10 cm) gauze pads. ? Pre-cut pads or gauze for under the faceplate. ? Clamp or tweezers. 2. Wash your hands, and have your helper wash his or her hands. 3. Put on clean gloves, and have your helper put on clean gloves. 4. If using tracheostomy ties: ? Measure a length of twill tie that can go around the neck twice. ? Cut the end of the tie on a diagonal to a point. This will make it easier to thread through the opening of the faceplate. 5. If using a tracheostomy holder, open the packaging. How to clean the tracheostomy 1. Fill a container with the water or 0.9% saline solution. 2. Have the person tip his or her head back a little. This will make it easier to see and clean the opening in his or her neck. 3. Place a rolled-up blanket or towel under or behind the person's shoulders. 4. If there are any pads under the faceplate, remove them. 5. Wet the sponge and cotton swabs with the water or 0.9% saline solution. 6. Clean the  tracheostomy site, surrounding skin, and neck. 7. Allow the skin to dry. You may pat it dry with a dry 4x4 inch (10x10 cm) gauze. 8. Replace the tracheostomy pads with a pre-cut pad or gauze. How to replace the ties or holder If Tracheostomy Ties Are Used Follow these steps to replace them: 1. While you hold the tracheostomy tube--also called a trach (rhymes with take)--have your helper cut or loosen the ties that are in place. Make sure the tube (trach) stays in place until the new ties are secured. (If working alone, do not remove the old ties until the new ties are secured.) 2. Use a clamp or tweezers, if needed, to thread one end of the new tie through the faceplate or trach flange. Pull through until the ends are even. 3. Bring ties around the back of the neck, then thread one tie through the opposite hole in the faceplate. 4. Gently pull the ties to secure the trach. 5. Secure the tie with a double square knot. For comfort, make sure you can fit 1-2 finger widths between the neck and the tie.  If a Tracheostomy Holder Is Used Have one person hold the trach while the other person changes the holder. Make sure the trach stays in place until the new ties are secured. (If working alone, apply and secure the new holder before you remove the dirty holder.) Follow these steps to replace the holder: 1. Position the  strap around the back of the neck. 2. Slide the narrow ends under and through the faceplate. 3. Gently pull the ends of the strap so they are tight and secured. For comfort, make sure you can fit 1-2 finger widths between the neck and the strap.  How to finish the process 1. Throw away any used supplies. 2. Remove your gloves, and have your helper remove his or her gloves. 3. Wash your hands, and have your helper wash his or her hands. This information is not intended to replace advice given to you by your health care provider. Make sure you discuss any questions you have with your  health care provider. Document Released: 03/06/2012 Document Revised: 10/14/2015 Document Reviewed: 04/02/2015 Elsevier Interactive Patient Education  2018 [employer].    Tracheostomy and Tracheostomy Tube Safety and Care, Adult A tracheostomy, or trach (rhymes with "take"), is a surgically created opening in the trachea. A tracheostomy tube, or trach tube, is a tube that is placed in this opening to help with breathing. Taking good care of your trach and trach tube will help you stay safe and free from infection. What safety actions should I take? General Instructions   Follow instructions from your health care provider about: ? Cleaning your trach and the area around it. ? Suctioning your trach. ? Cleaning your trach tube. ? Changing your trach tube.  Keep your trach and trach tube dry. When you take a shower: ? Cover your trach with a shower shield or other protective covering. ? Try not to point the shower spray at your trach.  Keep clothing away from your trach tube. Clothing can block the tube. When dressing: ? Avoid wearing crew-neck shirts and turtlenecks. ? Wear V -neck shirts and open-collar shirts or blouses. ? You may wear a scarf over the trach tube. ? Do not wear clothes that shed fibers or lint.  Do not put anything in your trach tube that should not be there.  Use a humidifier to keep some moisture in the air. This will keep your airway and lungs from getting dry. Clean the humidifier regularly to prevent mold and mildew from building up.  Avoid dust, mold, smoke, and fumes from cleaning solutions, such as ammonia or bleach.  When you use any kind of spray or powder, cover your trach tube. Do not inhale the spray or powder. Before You Leave Home:  (If it is cold outside) Wear a filter to keep cold air from entering your trach tube and irritating your trachea and lungs. You can also loosely cover your trach tube with a protective scarf, handkerchief, or gauze.  Wearing a scarf helps to warm the air as you breathe.  (If it is windy) Consider covering your trach tube with a scarf. This will help to keep dust or dirt from getting into your tube.  Always carry your emergency travel kit with you when you leave the house. Your kit should include: ? A portable suction machine. ? Suction catheters. ? A mucus trap. ? A bulb syringe. ? Two trach tubes. One should be the same size as your current tube, and the other should be smaller. ? [place]. ? A heat and moisture exchanger. ? Emergency phone numbers. ? Sterile water. ? 0.9% saline solution. ? Sterile gloves or hand sanitizer. ? Sterile gauze pads. If You Get Sick:  Suction your trach more often.  Do these things if you vomit: ? Cover your trach tube with a towel to keep vomit  out of your airway. ? Suction right away if you think vomit may have gotten into your trach tube. If You Use a Ventilator:  Check the ventilator safety and sound alarms every day to be sure they work.  Make sure the ventilator tubes are in the right place and do not pull on the trach tube.  Do not twist or pull on the trach connector more than needed. Too much pulling or twisting can cause the ventilator tubes to disconnect.  Hold the trach tube in place while you hook up or disconnect the ventilator or humidification tubing.  Always use an inner cannula without side openings (non-fenestrated cannula) with the correct connector if you are using a trach tube that has one or more side openings (fenestrated trach tube). Contact a health care provider if:  You cannot remove your trach tube or inner cannula.  You have redness, swelling, or tenderness around your trach.  You have fluid or blood coming from your trach.  Your trach feels warm to the touch.  You have pus or a bad smell coming from your trach.  You have a fever or chills.  Your muscles ache.  You have a cough that gets worse. Get help right away  if:  You have chest pain or trouble breathing, even after you suction and clean your trach tube.  You have a fever for more than 2-3 days.  You have nausea and vomiting.  You feel dizzy or you faint.  You have trouble swallowing.  You have unusual sounds coming from the airway.  You continue to cough or have shortness of breath after suctioning.  There is bright red blood in your mucus.  Your tube gets plugged and you cannot clear it.  Your tube falls out and cannot be put back in. This information is not intended to replace advice given to you by your health care provider. Make sure you discuss any questions you have with your health care provider. Document Released: 03/06/2012 Document Revised: 02/08/2016 Document Reviewed: 03/29/2015 Elsevier Interactive Patient Education  [employer].

## 2018-06-02 DIAGNOSIS — I5022 Chronic systolic (congestive) heart failure: Secondary | ICD-10-CM | POA: Diagnosis not present

## 2018-06-02 DIAGNOSIS — J9621 Acute and chronic respiratory failure with hypoxia: Secondary | ICD-10-CM | POA: Diagnosis not present

## 2018-06-02 DIAGNOSIS — N189 Chronic kidney disease, unspecified: Secondary | ICD-10-CM | POA: Diagnosis not present

## 2018-06-02 DIAGNOSIS — J690 Pneumonitis due to inhalation of food and vomit: Secondary | ICD-10-CM

## 2018-06-02 DIAGNOSIS — G9340 Encephalopathy, unspecified: Secondary | ICD-10-CM | POA: Diagnosis not present

## 2018-06-03 DIAGNOSIS — J9620 Acute and chronic respiratory failure, unspecified whether with hypoxia or hypercapnia: Secondary | ICD-10-CM | POA: Diagnosis not present

## 2018-06-03 DIAGNOSIS — Z9911 Dependence on respirator [ventilator] status: Secondary | ICD-10-CM | POA: Diagnosis not present

## 2018-06-03 DIAGNOSIS — I959 Hypotension, unspecified: Secondary | ICD-10-CM | POA: Diagnosis not present

## 2018-06-03 DIAGNOSIS — J9621 Acute and chronic respiratory failure with hypoxia: Secondary | ICD-10-CM | POA: Diagnosis not present

## 2018-06-03 DIAGNOSIS — R6521 Severe sepsis with septic shock: Secondary | ICD-10-CM | POA: Diagnosis not present

## 2018-06-03 DIAGNOSIS — J189 Pneumonia, unspecified organism: Secondary | ICD-10-CM | POA: Diagnosis not present

## 2018-06-03 DIAGNOSIS — J159 Unspecified bacterial pneumonia: Secondary | ICD-10-CM | POA: Diagnosis not present

## 2018-06-04 DIAGNOSIS — I4891 Unspecified atrial fibrillation: Secondary | ICD-10-CM | POA: Diagnosis not present

## 2018-06-04 DIAGNOSIS — R4189 Other symptoms and signs involving cognitive functions and awareness: Secondary | ICD-10-CM | POA: Diagnosis not present

## 2018-06-04 DIAGNOSIS — I959 Hypotension, unspecified: Secondary | ICD-10-CM | POA: Diagnosis not present

## 2018-06-04 DIAGNOSIS — J159 Unspecified bacterial pneumonia: Secondary | ICD-10-CM | POA: Diagnosis not present

## 2018-06-04 DIAGNOSIS — I999 Unspecified disorder of circulatory system: Secondary | ICD-10-CM | POA: Diagnosis not present

## 2018-06-04 DIAGNOSIS — M6281 Muscle weakness (generalized): Secondary | ICD-10-CM | POA: Diagnosis not present

## 2018-06-04 DIAGNOSIS — J9620 Acute and chronic respiratory failure, unspecified whether with hypoxia or hypercapnia: Secondary | ICD-10-CM | POA: Diagnosis not present

## 2018-06-04 DIAGNOSIS — J189 Pneumonia, unspecified organism: Secondary | ICD-10-CM | POA: Diagnosis not present

## 2018-06-04 DIAGNOSIS — R2689 Other abnormalities of gait and mobility: Secondary | ICD-10-CM | POA: Diagnosis not present

## 2018-06-04 DIAGNOSIS — I5033 Acute on chronic diastolic (congestive) heart failure: Secondary | ICD-10-CM | POA: Diagnosis not present

## 2018-06-04 DIAGNOSIS — J9621 Acute and chronic respiratory failure with hypoxia: Secondary | ICD-10-CM | POA: Diagnosis not present

## 2018-06-04 DIAGNOSIS — R6521 Severe sepsis with septic shock: Secondary | ICD-10-CM | POA: Diagnosis not present

## 2018-06-04 DIAGNOSIS — Z9911 Dependence on respirator [ventilator] status: Secondary | ICD-10-CM | POA: Diagnosis not present

## 2018-06-04 DIAGNOSIS — E119 Type 2 diabetes mellitus without complications: Secondary | ICD-10-CM | POA: Diagnosis not present

## 2018-06-04 DIAGNOSIS — I69398 Other sequelae of cerebral infarction: Secondary | ICD-10-CM | POA: Diagnosis not present

## 2018-06-05 DIAGNOSIS — J9621 Acute and chronic respiratory failure with hypoxia: Secondary | ICD-10-CM | POA: Diagnosis not present

## 2018-06-05 DIAGNOSIS — Z9911 Dependence on respirator [ventilator] status: Secondary | ICD-10-CM | POA: Diagnosis not present

## 2018-06-05 DIAGNOSIS — J189 Pneumonia, unspecified organism: Secondary | ICD-10-CM | POA: Diagnosis not present

## 2018-06-05 DIAGNOSIS — J9620 Acute and chronic respiratory failure, unspecified whether with hypoxia or hypercapnia: Secondary | ICD-10-CM | POA: Diagnosis not present

## 2018-06-05 DIAGNOSIS — J159 Unspecified bacterial pneumonia: Secondary | ICD-10-CM | POA: Diagnosis not present

## 2018-06-05 DIAGNOSIS — R6521 Severe sepsis with septic shock: Secondary | ICD-10-CM | POA: Diagnosis not present

## 2018-06-05 DIAGNOSIS — I959 Hypotension, unspecified: Secondary | ICD-10-CM | POA: Diagnosis not present

## 2018-06-06 DIAGNOSIS — R4189 Other symptoms and signs involving cognitive functions and awareness: Secondary | ICD-10-CM | POA: Diagnosis not present

## 2018-06-06 DIAGNOSIS — I959 Hypotension, unspecified: Secondary | ICD-10-CM | POA: Diagnosis not present

## 2018-06-06 DIAGNOSIS — R2689 Other abnormalities of gait and mobility: Secondary | ICD-10-CM | POA: Diagnosis not present

## 2018-06-06 DIAGNOSIS — J9621 Acute and chronic respiratory failure with hypoxia: Secondary | ICD-10-CM | POA: Diagnosis not present

## 2018-06-06 DIAGNOSIS — J189 Pneumonia, unspecified organism: Secondary | ICD-10-CM | POA: Diagnosis not present

## 2018-06-06 DIAGNOSIS — I69398 Other sequelae of cerebral infarction: Secondary | ICD-10-CM | POA: Diagnosis not present

## 2018-06-06 DIAGNOSIS — J9620 Acute and chronic respiratory failure, unspecified whether with hypoxia or hypercapnia: Secondary | ICD-10-CM | POA: Diagnosis not present

## 2018-06-06 DIAGNOSIS — R6521 Severe sepsis with septic shock: Secondary | ICD-10-CM | POA: Diagnosis not present

## 2018-06-06 DIAGNOSIS — J159 Unspecified bacterial pneumonia: Secondary | ICD-10-CM | POA: Diagnosis not present

## 2018-06-06 DIAGNOSIS — M6281 Muscle weakness (generalized): Secondary | ICD-10-CM | POA: Diagnosis not present

## 2018-06-06 DIAGNOSIS — Z9911 Dependence on respirator [ventilator] status: Secondary | ICD-10-CM | POA: Diagnosis not present

## 2018-06-07 DIAGNOSIS — R6521 Severe sepsis with septic shock: Secondary | ICD-10-CM | POA: Diagnosis not present

## 2018-06-07 DIAGNOSIS — Z9911 Dependence on respirator [ventilator] status: Secondary | ICD-10-CM | POA: Diagnosis not present

## 2018-06-07 DIAGNOSIS — J189 Pneumonia, unspecified organism: Secondary | ICD-10-CM | POA: Diagnosis not present

## 2018-06-07 DIAGNOSIS — J9621 Acute and chronic respiratory failure with hypoxia: Secondary | ICD-10-CM | POA: Diagnosis not present

## 2018-06-07 DIAGNOSIS — J159 Unspecified bacterial pneumonia: Secondary | ICD-10-CM | POA: Diagnosis not present

## 2018-06-07 DIAGNOSIS — I959 Hypotension, unspecified: Secondary | ICD-10-CM | POA: Diagnosis not present

## 2018-06-07 DIAGNOSIS — J9620 Acute and chronic respiratory failure, unspecified whether with hypoxia or hypercapnia: Secondary | ICD-10-CM | POA: Diagnosis not present

## 2018-06-08 DIAGNOSIS — J189 Pneumonia, unspecified organism: Secondary | ICD-10-CM | POA: Diagnosis not present

## 2018-06-08 DIAGNOSIS — Z9911 Dependence on respirator [ventilator] status: Secondary | ICD-10-CM | POA: Diagnosis not present

## 2018-06-08 DIAGNOSIS — R6521 Severe sepsis with septic shock: Secondary | ICD-10-CM | POA: Diagnosis not present

## 2018-06-08 DIAGNOSIS — K56600 Partial intestinal obstruction, unspecified as to cause: Secondary | ICD-10-CM | POA: Diagnosis not present

## 2018-06-08 DIAGNOSIS — J9621 Acute and chronic respiratory failure with hypoxia: Secondary | ICD-10-CM | POA: Diagnosis not present

## 2018-06-09 DIAGNOSIS — J9621 Acute and chronic respiratory failure with hypoxia: Secondary | ICD-10-CM | POA: Diagnosis not present

## 2018-06-09 DIAGNOSIS — J189 Pneumonia, unspecified organism: Secondary | ICD-10-CM | POA: Diagnosis not present

## 2018-06-09 DIAGNOSIS — K56600 Partial intestinal obstruction, unspecified as to cause: Secondary | ICD-10-CM | POA: Diagnosis not present

## 2018-06-09 DIAGNOSIS — Z9911 Dependence on respirator [ventilator] status: Secondary | ICD-10-CM | POA: Diagnosis not present

## 2018-06-09 DIAGNOSIS — R6521 Severe sepsis with septic shock: Secondary | ICD-10-CM | POA: Diagnosis not present

## 2018-06-10 DIAGNOSIS — G9340 Encephalopathy, unspecified: Secondary | ICD-10-CM

## 2018-06-10 DIAGNOSIS — J690 Pneumonitis due to inhalation of food and vomit: Secondary | ICD-10-CM | POA: Diagnosis not present

## 2018-06-10 DIAGNOSIS — I959 Hypotension, unspecified: Secondary | ICD-10-CM | POA: Diagnosis not present

## 2018-06-10 DIAGNOSIS — R4189 Other symptoms and signs involving cognitive functions and awareness: Secondary | ICD-10-CM | POA: Diagnosis not present

## 2018-06-10 DIAGNOSIS — I69398 Other sequelae of cerebral infarction: Secondary | ICD-10-CM | POA: Diagnosis not present

## 2018-06-10 DIAGNOSIS — R2689 Other abnormalities of gait and mobility: Secondary | ICD-10-CM | POA: Diagnosis not present

## 2018-06-10 DIAGNOSIS — N189 Chronic kidney disease, unspecified: Secondary | ICD-10-CM

## 2018-06-10 DIAGNOSIS — J9620 Acute and chronic respiratory failure, unspecified whether with hypoxia or hypercapnia: Secondary | ICD-10-CM | POA: Diagnosis not present

## 2018-06-10 DIAGNOSIS — I5022 Chronic systolic (congestive) heart failure: Secondary | ICD-10-CM

## 2018-06-10 DIAGNOSIS — M6281 Muscle weakness (generalized): Secondary | ICD-10-CM | POA: Diagnosis not present

## 2018-06-10 DIAGNOSIS — J159 Unspecified bacterial pneumonia: Secondary | ICD-10-CM | POA: Diagnosis not present

## 2018-06-10 DIAGNOSIS — J9621 Acute and chronic respiratory failure with hypoxia: Secondary | ICD-10-CM

## 2018-06-11 DIAGNOSIS — N189 Chronic kidney disease, unspecified: Secondary | ICD-10-CM | POA: Diagnosis not present

## 2018-06-11 DIAGNOSIS — J9621 Acute and chronic respiratory failure with hypoxia: Secondary | ICD-10-CM

## 2018-06-11 DIAGNOSIS — I959 Hypotension, unspecified: Secondary | ICD-10-CM | POA: Diagnosis not present

## 2018-06-11 DIAGNOSIS — I5022 Chronic systolic (congestive) heart failure: Secondary | ICD-10-CM | POA: Diagnosis not present

## 2018-06-11 DIAGNOSIS — J9620 Acute and chronic respiratory failure, unspecified whether with hypoxia or hypercapnia: Secondary | ICD-10-CM | POA: Diagnosis not present

## 2018-06-11 DIAGNOSIS — G9340 Encephalopathy, unspecified: Secondary | ICD-10-CM | POA: Diagnosis not present

## 2018-06-11 DIAGNOSIS — J159 Unspecified bacterial pneumonia: Secondary | ICD-10-CM | POA: Diagnosis not present

## 2018-06-11 DIAGNOSIS — J690 Pneumonitis due to inhalation of food and vomit: Secondary | ICD-10-CM | POA: Diagnosis not present

## 2018-06-12 DIAGNOSIS — J159 Unspecified bacterial pneumonia: Secondary | ICD-10-CM | POA: Diagnosis not present

## 2018-06-12 DIAGNOSIS — I959 Hypotension, unspecified: Secondary | ICD-10-CM | POA: Diagnosis not present

## 2018-06-12 DIAGNOSIS — J690 Pneumonitis due to inhalation of food and vomit: Secondary | ICD-10-CM

## 2018-06-12 DIAGNOSIS — R2689 Other abnormalities of gait and mobility: Secondary | ICD-10-CM | POA: Diagnosis not present

## 2018-06-12 DIAGNOSIS — I5022 Chronic systolic (congestive) heart failure: Secondary | ICD-10-CM

## 2018-06-12 DIAGNOSIS — R4189 Other symptoms and signs involving cognitive functions and awareness: Secondary | ICD-10-CM | POA: Diagnosis not present

## 2018-06-12 DIAGNOSIS — J9620 Acute and chronic respiratory failure, unspecified whether with hypoxia or hypercapnia: Secondary | ICD-10-CM | POA: Diagnosis not present

## 2018-06-12 DIAGNOSIS — G9340 Encephalopathy, unspecified: Secondary | ICD-10-CM | POA: Diagnosis not present

## 2018-06-12 DIAGNOSIS — N189 Chronic kidney disease, unspecified: Secondary | ICD-10-CM

## 2018-06-12 DIAGNOSIS — J9621 Acute and chronic respiratory failure with hypoxia: Secondary | ICD-10-CM

## 2018-06-12 DIAGNOSIS — I69398 Other sequelae of cerebral infarction: Secondary | ICD-10-CM | POA: Diagnosis not present

## 2018-06-12 DIAGNOSIS — M6281 Muscle weakness (generalized): Secondary | ICD-10-CM | POA: Diagnosis not present

## 2018-06-13 DIAGNOSIS — I5022 Chronic systolic (congestive) heart failure: Secondary | ICD-10-CM | POA: Diagnosis not present

## 2018-06-13 DIAGNOSIS — J9621 Acute and chronic respiratory failure with hypoxia: Secondary | ICD-10-CM | POA: Diagnosis not present

## 2018-06-13 DIAGNOSIS — J9620 Acute and chronic respiratory failure, unspecified whether with hypoxia or hypercapnia: Secondary | ICD-10-CM | POA: Diagnosis not present

## 2018-06-13 DIAGNOSIS — I959 Hypotension, unspecified: Secondary | ICD-10-CM | POA: Diagnosis not present

## 2018-06-13 DIAGNOSIS — G9340 Encephalopathy, unspecified: Secondary | ICD-10-CM | POA: Diagnosis not present

## 2018-06-13 DIAGNOSIS — J690 Pneumonitis due to inhalation of food and vomit: Secondary | ICD-10-CM | POA: Diagnosis not present

## 2018-06-13 DIAGNOSIS — J159 Unspecified bacterial pneumonia: Secondary | ICD-10-CM | POA: Diagnosis not present

## 2018-06-13 DIAGNOSIS — N189 Chronic kidney disease, unspecified: Secondary | ICD-10-CM

## 2018-06-14 DIAGNOSIS — I69398 Other sequelae of cerebral infarction: Secondary | ICD-10-CM | POA: Diagnosis not present

## 2018-06-14 DIAGNOSIS — R2689 Other abnormalities of gait and mobility: Secondary | ICD-10-CM | POA: Diagnosis not present

## 2018-06-14 DIAGNOSIS — G9340 Encephalopathy, unspecified: Secondary | ICD-10-CM

## 2018-06-14 DIAGNOSIS — N189 Chronic kidney disease, unspecified: Secondary | ICD-10-CM | POA: Diagnosis not present

## 2018-06-14 DIAGNOSIS — J690 Pneumonitis due to inhalation of food and vomit: Secondary | ICD-10-CM | POA: Diagnosis not present

## 2018-06-14 DIAGNOSIS — I5022 Chronic systolic (congestive) heart failure: Secondary | ICD-10-CM | POA: Diagnosis not present

## 2018-06-14 DIAGNOSIS — J9620 Acute and chronic respiratory failure, unspecified whether with hypoxia or hypercapnia: Secondary | ICD-10-CM | POA: Diagnosis not present

## 2018-06-14 DIAGNOSIS — M6281 Muscle weakness (generalized): Secondary | ICD-10-CM | POA: Diagnosis not present

## 2018-06-14 DIAGNOSIS — R0989 Other specified symptoms and signs involving the circulatory and respiratory systems: Secondary | ICD-10-CM | POA: Diagnosis not present

## 2018-06-14 DIAGNOSIS — R4189 Other symptoms and signs involving cognitive functions and awareness: Secondary | ICD-10-CM | POA: Diagnosis not present

## 2018-06-14 DIAGNOSIS — J159 Unspecified bacterial pneumonia: Secondary | ICD-10-CM | POA: Diagnosis not present

## 2018-06-14 DIAGNOSIS — I959 Hypotension, unspecified: Secondary | ICD-10-CM | POA: Diagnosis not present

## 2018-06-14 DIAGNOSIS — J9621 Acute and chronic respiratory failure with hypoxia: Secondary | ICD-10-CM

## 2018-06-15 DIAGNOSIS — I5022 Chronic systolic (congestive) heart failure: Secondary | ICD-10-CM | POA: Diagnosis not present

## 2018-06-15 DIAGNOSIS — K9289 Other specified diseases of the digestive system: Secondary | ICD-10-CM | POA: Diagnosis not present

## 2018-06-15 DIAGNOSIS — J9621 Acute and chronic respiratory failure with hypoxia: Secondary | ICD-10-CM | POA: Diagnosis not present

## 2018-06-15 DIAGNOSIS — N189 Chronic kidney disease, unspecified: Secondary | ICD-10-CM | POA: Diagnosis not present

## 2018-06-15 DIAGNOSIS — G9340 Encephalopathy, unspecified: Secondary | ICD-10-CM

## 2018-06-15 DIAGNOSIS — J690 Pneumonitis due to inhalation of food and vomit: Secondary | ICD-10-CM

## 2018-06-15 DIAGNOSIS — E43 Unspecified severe protein-calorie malnutrition: Secondary | ICD-10-CM | POA: Diagnosis not present

## 2018-06-16 DIAGNOSIS — G9340 Encephalopathy, unspecified: Secondary | ICD-10-CM | POA: Diagnosis not present

## 2018-06-16 DIAGNOSIS — E43 Unspecified severe protein-calorie malnutrition: Secondary | ICD-10-CM | POA: Diagnosis not present

## 2018-06-16 DIAGNOSIS — I5022 Chronic systolic (congestive) heart failure: Secondary | ICD-10-CM | POA: Diagnosis not present

## 2018-06-16 DIAGNOSIS — J690 Pneumonitis due to inhalation of food and vomit: Secondary | ICD-10-CM | POA: Diagnosis not present

## 2018-06-16 DIAGNOSIS — J9621 Acute and chronic respiratory failure with hypoxia: Secondary | ICD-10-CM

## 2018-06-16 DIAGNOSIS — N189 Chronic kidney disease, unspecified: Secondary | ICD-10-CM

## 2018-06-16 DIAGNOSIS — K9289 Other specified diseases of the digestive system: Secondary | ICD-10-CM | POA: Diagnosis not present

## 2018-06-17 DIAGNOSIS — N189 Chronic kidney disease, unspecified: Secondary | ICD-10-CM | POA: Diagnosis not present

## 2018-06-17 DIAGNOSIS — J9620 Acute and chronic respiratory failure, unspecified whether with hypoxia or hypercapnia: Secondary | ICD-10-CM | POA: Diagnosis not present

## 2018-06-17 DIAGNOSIS — G9340 Encephalopathy, unspecified: Secondary | ICD-10-CM | POA: Diagnosis not present

## 2018-06-17 DIAGNOSIS — J159 Unspecified bacterial pneumonia: Secondary | ICD-10-CM | POA: Diagnosis not present

## 2018-06-17 DIAGNOSIS — J690 Pneumonitis due to inhalation of food and vomit: Secondary | ICD-10-CM | POA: Diagnosis not present

## 2018-06-17 DIAGNOSIS — I959 Hypotension, unspecified: Secondary | ICD-10-CM | POA: Diagnosis not present

## 2018-06-17 DIAGNOSIS — J9621 Acute and chronic respiratory failure with hypoxia: Secondary | ICD-10-CM | POA: Diagnosis not present

## 2018-06-17 DIAGNOSIS — I5022 Chronic systolic (congestive) heart failure: Secondary | ICD-10-CM

## 2018-06-18 DIAGNOSIS — G9340 Encephalopathy, unspecified: Secondary | ICD-10-CM

## 2018-06-18 DIAGNOSIS — I69398 Other sequelae of cerebral infarction: Secondary | ICD-10-CM | POA: Diagnosis not present

## 2018-06-18 DIAGNOSIS — I959 Hypotension, unspecified: Secondary | ICD-10-CM | POA: Diagnosis not present

## 2018-06-18 DIAGNOSIS — R4189 Other symptoms and signs involving cognitive functions and awareness: Secondary | ICD-10-CM | POA: Diagnosis not present

## 2018-06-18 DIAGNOSIS — N189 Chronic kidney disease, unspecified: Secondary | ICD-10-CM

## 2018-06-18 DIAGNOSIS — J690 Pneumonitis due to inhalation of food and vomit: Secondary | ICD-10-CM | POA: Diagnosis not present

## 2018-06-18 DIAGNOSIS — J159 Unspecified bacterial pneumonia: Secondary | ICD-10-CM | POA: Diagnosis not present

## 2018-06-18 DIAGNOSIS — R2689 Other abnormalities of gait and mobility: Secondary | ICD-10-CM | POA: Diagnosis not present

## 2018-06-18 DIAGNOSIS — I5022 Chronic systolic (congestive) heart failure: Secondary | ICD-10-CM | POA: Diagnosis not present

## 2018-06-18 DIAGNOSIS — J9621 Acute and chronic respiratory failure with hypoxia: Secondary | ICD-10-CM

## 2018-06-18 DIAGNOSIS — J9620 Acute and chronic respiratory failure, unspecified whether with hypoxia or hypercapnia: Secondary | ICD-10-CM | POA: Diagnosis not present

## 2018-06-18 DIAGNOSIS — M6281 Muscle weakness (generalized): Secondary | ICD-10-CM | POA: Diagnosis not present

## 2018-06-19 DIAGNOSIS — J9621 Acute and chronic respiratory failure with hypoxia: Secondary | ICD-10-CM

## 2018-06-19 DIAGNOSIS — J9620 Acute and chronic respiratory failure, unspecified whether with hypoxia or hypercapnia: Secondary | ICD-10-CM | POA: Diagnosis not present

## 2018-06-19 DIAGNOSIS — I959 Hypotension, unspecified: Secondary | ICD-10-CM | POA: Diagnosis not present

## 2018-06-19 DIAGNOSIS — J690 Pneumonitis due to inhalation of food and vomit: Secondary | ICD-10-CM

## 2018-06-19 DIAGNOSIS — N189 Chronic kidney disease, unspecified: Secondary | ICD-10-CM | POA: Diagnosis not present

## 2018-06-19 DIAGNOSIS — J159 Unspecified bacterial pneumonia: Secondary | ICD-10-CM | POA: Diagnosis not present

## 2018-06-19 DIAGNOSIS — G9340 Encephalopathy, unspecified: Secondary | ICD-10-CM

## 2018-06-19 DIAGNOSIS — I5022 Chronic systolic (congestive) heart failure: Secondary | ICD-10-CM

## 2018-06-20 DIAGNOSIS — J9621 Acute and chronic respiratory failure with hypoxia: Secondary | ICD-10-CM | POA: Diagnosis not present

## 2018-06-20 DIAGNOSIS — J159 Unspecified bacterial pneumonia: Secondary | ICD-10-CM | POA: Diagnosis not present

## 2018-06-20 DIAGNOSIS — I509 Heart failure, unspecified: Secondary | ICD-10-CM | POA: Diagnosis not present

## 2018-06-20 DIAGNOSIS — I959 Hypotension, unspecified: Secondary | ICD-10-CM | POA: Diagnosis not present

## 2018-06-20 DIAGNOSIS — J90 Pleural effusion, not elsewhere classified: Secondary | ICD-10-CM | POA: Diagnosis not present

## 2018-06-20 DIAGNOSIS — Z9911 Dependence on respirator [ventilator] status: Secondary | ICD-10-CM | POA: Diagnosis not present

## 2018-06-20 DIAGNOSIS — J9620 Acute and chronic respiratory failure, unspecified whether with hypoxia or hypercapnia: Secondary | ICD-10-CM | POA: Diagnosis not present

## 2018-06-21 DIAGNOSIS — J9621 Acute and chronic respiratory failure with hypoxia: Secondary | ICD-10-CM | POA: Diagnosis not present

## 2018-06-21 DIAGNOSIS — Z9911 Dependence on respirator [ventilator] status: Secondary | ICD-10-CM | POA: Diagnosis not present

## 2018-06-21 DIAGNOSIS — J159 Unspecified bacterial pneumonia: Secondary | ICD-10-CM | POA: Diagnosis not present

## 2018-06-21 DIAGNOSIS — I959 Hypotension, unspecified: Secondary | ICD-10-CM | POA: Diagnosis not present

## 2018-06-21 DIAGNOSIS — J9620 Acute and chronic respiratory failure, unspecified whether with hypoxia or hypercapnia: Secondary | ICD-10-CM | POA: Diagnosis not present

## 2018-06-21 DIAGNOSIS — J90 Pleural effusion, not elsewhere classified: Secondary | ICD-10-CM | POA: Diagnosis not present

## 2018-06-22 DIAGNOSIS — Z9911 Dependence on respirator [ventilator] status: Secondary | ICD-10-CM | POA: Diagnosis not present

## 2018-06-22 DIAGNOSIS — J90 Pleural effusion, not elsewhere classified: Secondary | ICD-10-CM | POA: Diagnosis not present

## 2018-06-22 DIAGNOSIS — J9621 Acute and chronic respiratory failure with hypoxia: Secondary | ICD-10-CM | POA: Diagnosis not present

## 2018-06-23 DIAGNOSIS — Z9911 Dependence on respirator [ventilator] status: Secondary | ICD-10-CM | POA: Diagnosis not present

## 2018-06-23 DIAGNOSIS — J90 Pleural effusion, not elsewhere classified: Secondary | ICD-10-CM | POA: Diagnosis not present

## 2018-06-23 DIAGNOSIS — R509 Fever, unspecified: Secondary | ICD-10-CM | POA: Diagnosis not present

## 2018-06-23 DIAGNOSIS — J9621 Acute and chronic respiratory failure with hypoxia: Secondary | ICD-10-CM | POA: Diagnosis not present

## 2018-06-24 DIAGNOSIS — M6281 Muscle weakness (generalized): Secondary | ICD-10-CM | POA: Diagnosis not present

## 2018-06-24 DIAGNOSIS — J90 Pleural effusion, not elsewhere classified: Secondary | ICD-10-CM | POA: Diagnosis not present

## 2018-06-24 DIAGNOSIS — I69398 Other sequelae of cerebral infarction: Secondary | ICD-10-CM | POA: Diagnosis not present

## 2018-06-24 DIAGNOSIS — J918 Pleural effusion in other conditions classified elsewhere: Secondary | ICD-10-CM | POA: Diagnosis not present

## 2018-06-24 DIAGNOSIS — I4891 Unspecified atrial fibrillation: Secondary | ICD-10-CM | POA: Diagnosis not present

## 2018-06-24 DIAGNOSIS — R4189 Other symptoms and signs involving cognitive functions and awareness: Secondary | ICD-10-CM | POA: Diagnosis not present

## 2018-06-24 DIAGNOSIS — R2689 Other abnormalities of gait and mobility: Secondary | ICD-10-CM | POA: Diagnosis not present

## 2018-06-24 DIAGNOSIS — Z9911 Dependence on respirator [ventilator] status: Secondary | ICD-10-CM | POA: Diagnosis not present

## 2018-06-24 DIAGNOSIS — K56600 Partial intestinal obstruction, unspecified as to cause: Secondary | ICD-10-CM | POA: Diagnosis not present

## 2018-06-24 DIAGNOSIS — I509 Heart failure, unspecified: Secondary | ICD-10-CM | POA: Diagnosis not present

## 2018-06-24 DIAGNOSIS — J9621 Acute and chronic respiratory failure with hypoxia: Secondary | ICD-10-CM | POA: Diagnosis not present

## 2018-06-25 DIAGNOSIS — J9621 Acute and chronic respiratory failure with hypoxia: Secondary | ICD-10-CM | POA: Diagnosis not present

## 2018-06-25 DIAGNOSIS — K56600 Partial intestinal obstruction, unspecified as to cause: Secondary | ICD-10-CM | POA: Diagnosis not present

## 2018-06-25 DIAGNOSIS — Z9911 Dependence on respirator [ventilator] status: Secondary | ICD-10-CM | POA: Diagnosis not present

## 2018-06-25 DIAGNOSIS — J90 Pleural effusion, not elsewhere classified: Secondary | ICD-10-CM | POA: Diagnosis not present

## 2018-06-27 DIAGNOSIS — J9621 Acute and chronic respiratory failure with hypoxia: Secondary | ICD-10-CM

## 2018-06-27 DIAGNOSIS — J690 Pneumonitis due to inhalation of food and vomit: Secondary | ICD-10-CM

## 2018-06-27 DIAGNOSIS — I5022 Chronic systolic (congestive) heart failure: Secondary | ICD-10-CM

## 2018-06-27 DIAGNOSIS — G9340 Encephalopathy, unspecified: Secondary | ICD-10-CM

## 2018-06-27 DIAGNOSIS — N189 Chronic kidney disease, unspecified: Secondary | ICD-10-CM

## 2018-06-28 DIAGNOSIS — J690 Pneumonitis due to inhalation of food and vomit: Secondary | ICD-10-CM

## 2018-06-28 DIAGNOSIS — I5022 Chronic systolic (congestive) heart failure: Secondary | ICD-10-CM

## 2018-06-28 DIAGNOSIS — G9340 Encephalopathy, unspecified: Secondary | ICD-10-CM

## 2018-06-28 DIAGNOSIS — N189 Chronic kidney disease, unspecified: Secondary | ICD-10-CM

## 2018-06-28 DIAGNOSIS — J9621 Acute and chronic respiratory failure with hypoxia: Secondary | ICD-10-CM

## 2018-06-29 DIAGNOSIS — I5022 Chronic systolic (congestive) heart failure: Secondary | ICD-10-CM

## 2018-06-29 DIAGNOSIS — G9340 Encephalopathy, unspecified: Secondary | ICD-10-CM

## 2018-06-29 DIAGNOSIS — J9621 Acute and chronic respiratory failure with hypoxia: Secondary | ICD-10-CM

## 2018-06-29 DIAGNOSIS — J690 Pneumonitis due to inhalation of food and vomit: Secondary | ICD-10-CM

## 2018-06-29 DIAGNOSIS — N189 Chronic kidney disease, unspecified: Secondary | ICD-10-CM

## 2018-06-30 DIAGNOSIS — J9621 Acute and chronic respiratory failure with hypoxia: Secondary | ICD-10-CM

## 2018-06-30 DIAGNOSIS — J690 Pneumonitis due to inhalation of food and vomit: Secondary | ICD-10-CM

## 2018-06-30 DIAGNOSIS — G9340 Encephalopathy, unspecified: Secondary | ICD-10-CM

## 2018-06-30 DIAGNOSIS — N189 Chronic kidney disease, unspecified: Secondary | ICD-10-CM

## 2018-06-30 DIAGNOSIS — I5022 Chronic systolic (congestive) heart failure: Secondary | ICD-10-CM

## 2018-07-08 DIAGNOSIS — N189 Chronic kidney disease, unspecified: Secondary | ICD-10-CM

## 2018-07-08 DIAGNOSIS — J690 Pneumonitis due to inhalation of food and vomit: Secondary | ICD-10-CM

## 2018-07-08 DIAGNOSIS — G9340 Encephalopathy, unspecified: Secondary | ICD-10-CM

## 2018-07-08 DIAGNOSIS — J9621 Acute and chronic respiratory failure with hypoxia: Secondary | ICD-10-CM

## 2018-07-08 DIAGNOSIS — I5022 Chronic systolic (congestive) heart failure: Secondary | ICD-10-CM

## 2018-07-09 DIAGNOSIS — J9621 Acute and chronic respiratory failure with hypoxia: Secondary | ICD-10-CM

## 2018-07-09 DIAGNOSIS — N189 Chronic kidney disease, unspecified: Secondary | ICD-10-CM

## 2018-07-09 DIAGNOSIS — J690 Pneumonitis due to inhalation of food and vomit: Secondary | ICD-10-CM

## 2018-07-09 DIAGNOSIS — G9340 Encephalopathy, unspecified: Secondary | ICD-10-CM

## 2018-07-09 DIAGNOSIS — I5022 Chronic systolic (congestive) heart failure: Secondary | ICD-10-CM

## 2018-07-10 DIAGNOSIS — G9340 Encephalopathy, unspecified: Secondary | ICD-10-CM

## 2018-07-10 DIAGNOSIS — I5022 Chronic systolic (congestive) heart failure: Secondary | ICD-10-CM

## 2018-07-10 DIAGNOSIS — J9621 Acute and chronic respiratory failure with hypoxia: Secondary | ICD-10-CM

## 2018-07-10 DIAGNOSIS — J690 Pneumonitis due to inhalation of food and vomit: Secondary | ICD-10-CM

## 2018-07-10 DIAGNOSIS — N189 Chronic kidney disease, unspecified: Secondary | ICD-10-CM

## 2018-07-11 DIAGNOSIS — J690 Pneumonitis due to inhalation of food and vomit: Secondary | ICD-10-CM

## 2018-07-11 DIAGNOSIS — J9621 Acute and chronic respiratory failure with hypoxia: Secondary | ICD-10-CM

## 2018-07-11 DIAGNOSIS — G9340 Encephalopathy, unspecified: Secondary | ICD-10-CM

## 2018-07-11 DIAGNOSIS — N189 Chronic kidney disease, unspecified: Secondary | ICD-10-CM

## 2018-07-11 DIAGNOSIS — I5022 Chronic systolic (congestive) heart failure: Secondary | ICD-10-CM

## 2018-07-12 DIAGNOSIS — J9621 Acute and chronic respiratory failure with hypoxia: Secondary | ICD-10-CM

## 2018-07-12 DIAGNOSIS — J690 Pneumonitis due to inhalation of food and vomit: Secondary | ICD-10-CM

## 2018-07-12 DIAGNOSIS — I5022 Chronic systolic (congestive) heart failure: Secondary | ICD-10-CM

## 2018-07-12 DIAGNOSIS — N189 Chronic kidney disease, unspecified: Secondary | ICD-10-CM

## 2018-07-12 DIAGNOSIS — G9340 Encephalopathy, unspecified: Secondary | ICD-10-CM

## 2018-07-13 DIAGNOSIS — J690 Pneumonitis due to inhalation of food and vomit: Secondary | ICD-10-CM

## 2018-07-13 DIAGNOSIS — N189 Chronic kidney disease, unspecified: Secondary | ICD-10-CM

## 2018-07-13 DIAGNOSIS — J9621 Acute and chronic respiratory failure with hypoxia: Secondary | ICD-10-CM

## 2018-07-13 DIAGNOSIS — G9340 Encephalopathy, unspecified: Secondary | ICD-10-CM

## 2018-07-13 DIAGNOSIS — I5022 Chronic systolic (congestive) heart failure: Secondary | ICD-10-CM

## 2018-07-14 DIAGNOSIS — N189 Chronic kidney disease, unspecified: Secondary | ICD-10-CM

## 2018-07-14 DIAGNOSIS — J690 Pneumonitis due to inhalation of food and vomit: Secondary | ICD-10-CM

## 2018-07-14 DIAGNOSIS — I5022 Chronic systolic (congestive) heart failure: Secondary | ICD-10-CM

## 2018-07-14 DIAGNOSIS — G9340 Encephalopathy, unspecified: Secondary | ICD-10-CM

## 2018-07-14 DIAGNOSIS — J9621 Acute and chronic respiratory failure with hypoxia: Secondary | ICD-10-CM

## 2018-07-22 DIAGNOSIS — G9340 Encephalopathy, unspecified: Secondary | ICD-10-CM

## 2018-07-22 DIAGNOSIS — J9621 Acute and chronic respiratory failure with hypoxia: Secondary | ICD-10-CM

## 2018-07-22 DIAGNOSIS — N189 Chronic kidney disease, unspecified: Secondary | ICD-10-CM

## 2018-07-22 DIAGNOSIS — I5022 Chronic systolic (congestive) heart failure: Secondary | ICD-10-CM

## 2018-07-22 DIAGNOSIS — J690 Pneumonitis due to inhalation of food and vomit: Secondary | ICD-10-CM

## 2018-07-23 DIAGNOSIS — J690 Pneumonitis due to inhalation of food and vomit: Secondary | ICD-10-CM

## 2018-07-23 DIAGNOSIS — N189 Chronic kidney disease, unspecified: Secondary | ICD-10-CM

## 2018-07-23 DIAGNOSIS — J9621 Acute and chronic respiratory failure with hypoxia: Secondary | ICD-10-CM

## 2018-07-23 DIAGNOSIS — G9340 Encephalopathy, unspecified: Secondary | ICD-10-CM

## 2018-07-23 DIAGNOSIS — I5022 Chronic systolic (congestive) heart failure: Secondary | ICD-10-CM

## 2018-07-24 DIAGNOSIS — I5022 Chronic systolic (congestive) heart failure: Secondary | ICD-10-CM

## 2018-07-24 DIAGNOSIS — G9340 Encephalopathy, unspecified: Secondary | ICD-10-CM

## 2018-07-24 DIAGNOSIS — J9621 Acute and chronic respiratory failure with hypoxia: Secondary | ICD-10-CM

## 2018-07-24 DIAGNOSIS — N189 Chronic kidney disease, unspecified: Secondary | ICD-10-CM

## 2018-07-24 DIAGNOSIS — J690 Pneumonitis due to inhalation of food and vomit: Secondary | ICD-10-CM

## 2018-07-25 DIAGNOSIS — J690 Pneumonitis due to inhalation of food and vomit: Secondary | ICD-10-CM

## 2018-07-25 DIAGNOSIS — I5022 Chronic systolic (congestive) heart failure: Secondary | ICD-10-CM

## 2018-07-25 DIAGNOSIS — N189 Chronic kidney disease, unspecified: Secondary | ICD-10-CM

## 2018-07-25 DIAGNOSIS — J9621 Acute and chronic respiratory failure with hypoxia: Secondary | ICD-10-CM

## 2018-07-25 DIAGNOSIS — G9340 Encephalopathy, unspecified: Secondary | ICD-10-CM

## 2018-07-26 DIAGNOSIS — G9340 Encephalopathy, unspecified: Secondary | ICD-10-CM

## 2018-07-26 DIAGNOSIS — J9621 Acute and chronic respiratory failure with hypoxia: Secondary | ICD-10-CM

## 2018-07-26 DIAGNOSIS — J690 Pneumonitis due to inhalation of food and vomit: Secondary | ICD-10-CM

## 2018-07-26 DIAGNOSIS — I5022 Chronic systolic (congestive) heart failure: Secondary | ICD-10-CM

## 2018-07-26 DIAGNOSIS — N189 Chronic kidney disease, unspecified: Secondary | ICD-10-CM

## 2018-07-27 DIAGNOSIS — J9621 Acute and chronic respiratory failure with hypoxia: Secondary | ICD-10-CM

## 2018-07-27 DIAGNOSIS — J690 Pneumonitis due to inhalation of food and vomit: Secondary | ICD-10-CM

## 2018-07-27 DIAGNOSIS — G9340 Encephalopathy, unspecified: Secondary | ICD-10-CM

## 2018-07-27 DIAGNOSIS — I5022 Chronic systolic (congestive) heart failure: Secondary | ICD-10-CM

## 2018-07-27 DIAGNOSIS — N189 Chronic kidney disease, unspecified: Secondary | ICD-10-CM

## 2018-07-28 DIAGNOSIS — J9621 Acute and chronic respiratory failure with hypoxia: Secondary | ICD-10-CM

## 2018-07-28 DIAGNOSIS — J690 Pneumonitis due to inhalation of food and vomit: Secondary | ICD-10-CM

## 2018-07-28 DIAGNOSIS — N189 Chronic kidney disease, unspecified: Secondary | ICD-10-CM

## 2018-07-28 DIAGNOSIS — G9340 Encephalopathy, unspecified: Secondary | ICD-10-CM

## 2018-07-28 DIAGNOSIS — I5022 Chronic systolic (congestive) heart failure: Secondary | ICD-10-CM

## 2018-08-05 DIAGNOSIS — J690 Pneumonitis due to inhalation of food and vomit: Secondary | ICD-10-CM

## 2018-08-05 DIAGNOSIS — J9621 Acute and chronic respiratory failure with hypoxia: Secondary | ICD-10-CM

## 2018-08-05 DIAGNOSIS — I5022 Chronic systolic (congestive) heart failure: Secondary | ICD-10-CM

## 2018-08-05 DIAGNOSIS — N189 Chronic kidney disease, unspecified: Secondary | ICD-10-CM

## 2018-08-05 DIAGNOSIS — G9340 Encephalopathy, unspecified: Secondary | ICD-10-CM

## 2018-08-06 DIAGNOSIS — J9621 Acute and chronic respiratory failure with hypoxia: Secondary | ICD-10-CM

## 2018-08-06 DIAGNOSIS — J690 Pneumonitis due to inhalation of food and vomit: Secondary | ICD-10-CM

## 2018-08-06 DIAGNOSIS — I5022 Chronic systolic (congestive) heart failure: Secondary | ICD-10-CM

## 2018-08-06 DIAGNOSIS — G9340 Encephalopathy, unspecified: Secondary | ICD-10-CM

## 2018-08-06 DIAGNOSIS — N189 Chronic kidney disease, unspecified: Secondary | ICD-10-CM

## 2018-08-07 DIAGNOSIS — N189 Chronic kidney disease, unspecified: Secondary | ICD-10-CM

## 2018-08-07 DIAGNOSIS — J9621 Acute and chronic respiratory failure with hypoxia: Secondary | ICD-10-CM

## 2018-08-07 DIAGNOSIS — I5022 Chronic systolic (congestive) heart failure: Secondary | ICD-10-CM

## 2018-08-07 DIAGNOSIS — G9340 Encephalopathy, unspecified: Secondary | ICD-10-CM

## 2018-08-07 DIAGNOSIS — J690 Pneumonitis due to inhalation of food and vomit: Secondary | ICD-10-CM

## 2018-08-08 DIAGNOSIS — G9340 Encephalopathy, unspecified: Secondary | ICD-10-CM

## 2018-08-08 DIAGNOSIS — J9621 Acute and chronic respiratory failure with hypoxia: Secondary | ICD-10-CM

## 2018-08-08 DIAGNOSIS — I5022 Chronic systolic (congestive) heart failure: Secondary | ICD-10-CM

## 2018-08-08 DIAGNOSIS — J690 Pneumonitis due to inhalation of food and vomit: Secondary | ICD-10-CM

## 2018-08-08 DIAGNOSIS — N189 Chronic kidney disease, unspecified: Secondary | ICD-10-CM

## 2018-08-09 DIAGNOSIS — J690 Pneumonitis due to inhalation of food and vomit: Secondary | ICD-10-CM

## 2018-08-09 DIAGNOSIS — G9340 Encephalopathy, unspecified: Secondary | ICD-10-CM

## 2018-08-09 DIAGNOSIS — N189 Chronic kidney disease, unspecified: Secondary | ICD-10-CM

## 2018-08-09 DIAGNOSIS — J9621 Acute and chronic respiratory failure with hypoxia: Secondary | ICD-10-CM

## 2018-08-09 DIAGNOSIS — I5022 Chronic systolic (congestive) heart failure: Secondary | ICD-10-CM

## 2018-08-10 DIAGNOSIS — I5022 Chronic systolic (congestive) heart failure: Secondary | ICD-10-CM

## 2018-08-10 DIAGNOSIS — N189 Chronic kidney disease, unspecified: Secondary | ICD-10-CM

## 2018-08-10 DIAGNOSIS — J690 Pneumonitis due to inhalation of food and vomit: Secondary | ICD-10-CM

## 2018-08-10 DIAGNOSIS — G9340 Encephalopathy, unspecified: Secondary | ICD-10-CM

## 2018-08-10 DIAGNOSIS — J9621 Acute and chronic respiratory failure with hypoxia: Secondary | ICD-10-CM

## 2018-08-11 DIAGNOSIS — N189 Chronic kidney disease, unspecified: Secondary | ICD-10-CM

## 2018-08-11 DIAGNOSIS — I5022 Chronic systolic (congestive) heart failure: Secondary | ICD-10-CM

## 2018-08-11 DIAGNOSIS — J690 Pneumonitis due to inhalation of food and vomit: Secondary | ICD-10-CM

## 2018-08-11 DIAGNOSIS — G9340 Encephalopathy, unspecified: Secondary | ICD-10-CM

## 2018-08-11 DIAGNOSIS — J9621 Acute and chronic respiratory failure with hypoxia: Secondary | ICD-10-CM

## 2018-08-19 DIAGNOSIS — G9340 Encephalopathy, unspecified: Secondary | ICD-10-CM

## 2018-08-19 DIAGNOSIS — N189 Chronic kidney disease, unspecified: Secondary | ICD-10-CM

## 2018-08-19 DIAGNOSIS — I5022 Chronic systolic (congestive) heart failure: Secondary | ICD-10-CM

## 2018-08-19 DIAGNOSIS — J690 Pneumonitis due to inhalation of food and vomit: Secondary | ICD-10-CM

## 2018-08-19 DIAGNOSIS — J9621 Acute and chronic respiratory failure with hypoxia: Secondary | ICD-10-CM

## 2018-08-20 DIAGNOSIS — J9621 Acute and chronic respiratory failure with hypoxia: Secondary | ICD-10-CM

## 2018-08-20 DIAGNOSIS — J690 Pneumonitis due to inhalation of food and vomit: Secondary | ICD-10-CM

## 2018-08-20 DIAGNOSIS — I5022 Chronic systolic (congestive) heart failure: Secondary | ICD-10-CM

## 2018-08-20 DIAGNOSIS — G9340 Encephalopathy, unspecified: Secondary | ICD-10-CM

## 2018-08-20 DIAGNOSIS — N189 Chronic kidney disease, unspecified: Secondary | ICD-10-CM

## 2018-08-21 DIAGNOSIS — N189 Chronic kidney disease, unspecified: Secondary | ICD-10-CM

## 2018-08-21 DIAGNOSIS — I5022 Chronic systolic (congestive) heart failure: Secondary | ICD-10-CM

## 2018-08-21 DIAGNOSIS — J690 Pneumonitis due to inhalation of food and vomit: Secondary | ICD-10-CM

## 2018-08-21 DIAGNOSIS — J9621 Acute and chronic respiratory failure with hypoxia: Secondary | ICD-10-CM

## 2018-08-21 DIAGNOSIS — G9340 Encephalopathy, unspecified: Secondary | ICD-10-CM

## 2018-08-22 DIAGNOSIS — I5022 Chronic systolic (congestive) heart failure: Secondary | ICD-10-CM

## 2018-08-22 DIAGNOSIS — J690 Pneumonitis due to inhalation of food and vomit: Secondary | ICD-10-CM

## 2018-08-22 DIAGNOSIS — J9621 Acute and chronic respiratory failure with hypoxia: Secondary | ICD-10-CM

## 2018-08-22 DIAGNOSIS — G9340 Encephalopathy, unspecified: Secondary | ICD-10-CM

## 2018-08-22 DIAGNOSIS — N189 Chronic kidney disease, unspecified: Secondary | ICD-10-CM

## 2018-08-23 DIAGNOSIS — G9340 Encephalopathy, unspecified: Secondary | ICD-10-CM

## 2018-08-23 DIAGNOSIS — I5022 Chronic systolic (congestive) heart failure: Secondary | ICD-10-CM

## 2018-08-23 DIAGNOSIS — N189 Chronic kidney disease, unspecified: Secondary | ICD-10-CM

## 2018-08-23 DIAGNOSIS — J690 Pneumonitis due to inhalation of food and vomit: Secondary | ICD-10-CM

## 2018-08-23 DIAGNOSIS — J9621 Acute and chronic respiratory failure with hypoxia: Secondary | ICD-10-CM

## 2018-08-24 DIAGNOSIS — J9621 Acute and chronic respiratory failure with hypoxia: Secondary | ICD-10-CM

## 2018-08-24 DIAGNOSIS — N189 Chronic kidney disease, unspecified: Secondary | ICD-10-CM

## 2018-08-24 DIAGNOSIS — G9340 Encephalopathy, unspecified: Secondary | ICD-10-CM

## 2018-08-24 DIAGNOSIS — J690 Pneumonitis due to inhalation of food and vomit: Secondary | ICD-10-CM

## 2018-08-24 DIAGNOSIS — I5022 Chronic systolic (congestive) heart failure: Secondary | ICD-10-CM

## 2018-08-25 DIAGNOSIS — I5022 Chronic systolic (congestive) heart failure: Secondary | ICD-10-CM

## 2018-08-25 DIAGNOSIS — J690 Pneumonitis due to inhalation of food and vomit: Secondary | ICD-10-CM

## 2018-08-25 DIAGNOSIS — N189 Chronic kidney disease, unspecified: Secondary | ICD-10-CM

## 2018-08-25 DIAGNOSIS — G9340 Encephalopathy, unspecified: Secondary | ICD-10-CM

## 2018-08-25 DIAGNOSIS — J9621 Acute and chronic respiratory failure with hypoxia: Secondary | ICD-10-CM

## 2018-08-30 DIAGNOSIS — I5022 Chronic systolic (congestive) heart failure: Secondary | ICD-10-CM

## 2018-08-30 DIAGNOSIS — N189 Chronic kidney disease, unspecified: Secondary | ICD-10-CM

## 2018-08-30 DIAGNOSIS — J690 Pneumonitis due to inhalation of food and vomit: Secondary | ICD-10-CM

## 2018-08-30 DIAGNOSIS — G9340 Encephalopathy, unspecified: Secondary | ICD-10-CM

## 2018-08-30 DIAGNOSIS — J9621 Acute and chronic respiratory failure with hypoxia: Secondary | ICD-10-CM

## 2018-08-31 DIAGNOSIS — N189 Chronic kidney disease, unspecified: Secondary | ICD-10-CM

## 2018-08-31 DIAGNOSIS — J9621 Acute and chronic respiratory failure with hypoxia: Secondary | ICD-10-CM

## 2018-08-31 DIAGNOSIS — I5022 Chronic systolic (congestive) heart failure: Secondary | ICD-10-CM

## 2018-08-31 DIAGNOSIS — G9340 Encephalopathy, unspecified: Secondary | ICD-10-CM

## 2018-08-31 DIAGNOSIS — J690 Pneumonitis due to inhalation of food and vomit: Secondary | ICD-10-CM

## 2018-09-01 DIAGNOSIS — N189 Chronic kidney disease, unspecified: Secondary | ICD-10-CM

## 2018-09-01 DIAGNOSIS — J9621 Acute and chronic respiratory failure with hypoxia: Secondary | ICD-10-CM

## 2018-09-01 DIAGNOSIS — G9340 Encephalopathy, unspecified: Secondary | ICD-10-CM

## 2018-09-01 DIAGNOSIS — J690 Pneumonitis due to inhalation of food and vomit: Secondary | ICD-10-CM

## 2018-09-01 DIAGNOSIS — I5022 Chronic systolic (congestive) heart failure: Secondary | ICD-10-CM

## 2018-09-02 DIAGNOSIS — J690 Pneumonitis due to inhalation of food and vomit: Secondary | ICD-10-CM

## 2018-09-02 DIAGNOSIS — N189 Chronic kidney disease, unspecified: Secondary | ICD-10-CM

## 2018-09-02 DIAGNOSIS — J9621 Acute and chronic respiratory failure with hypoxia: Secondary | ICD-10-CM

## 2018-09-02 DIAGNOSIS — G9340 Encephalopathy, unspecified: Secondary | ICD-10-CM

## 2018-09-02 DIAGNOSIS — I5022 Chronic systolic (congestive) heart failure: Secondary | ICD-10-CM

## 2018-09-03 DIAGNOSIS — J9621 Acute and chronic respiratory failure with hypoxia: Secondary | ICD-10-CM

## 2018-09-03 DIAGNOSIS — I5022 Chronic systolic (congestive) heart failure: Secondary | ICD-10-CM

## 2018-09-03 DIAGNOSIS — N189 Chronic kidney disease, unspecified: Secondary | ICD-10-CM

## 2018-09-03 DIAGNOSIS — J690 Pneumonitis due to inhalation of food and vomit: Secondary | ICD-10-CM

## 2018-09-03 DIAGNOSIS — G9340 Encephalopathy, unspecified: Secondary | ICD-10-CM

## 2018-09-04 DIAGNOSIS — G9340 Encephalopathy, unspecified: Secondary | ICD-10-CM

## 2018-09-04 DIAGNOSIS — N189 Chronic kidney disease, unspecified: Secondary | ICD-10-CM

## 2018-09-04 DIAGNOSIS — J690 Pneumonitis due to inhalation of food and vomit: Secondary | ICD-10-CM

## 2018-09-04 DIAGNOSIS — I5022 Chronic systolic (congestive) heart failure: Secondary | ICD-10-CM

## 2018-09-04 DIAGNOSIS — J9621 Acute and chronic respiratory failure with hypoxia: Secondary | ICD-10-CM

## 2018-09-05 DIAGNOSIS — J9621 Acute and chronic respiratory failure with hypoxia: Secondary | ICD-10-CM

## 2018-09-05 DIAGNOSIS — J690 Pneumonitis due to inhalation of food and vomit: Secondary | ICD-10-CM

## 2018-09-05 DIAGNOSIS — I5022 Chronic systolic (congestive) heart failure: Secondary | ICD-10-CM

## 2018-09-05 DIAGNOSIS — G9340 Encephalopathy, unspecified: Secondary | ICD-10-CM

## 2018-09-05 DIAGNOSIS — N189 Chronic kidney disease, unspecified: Secondary | ICD-10-CM

## 2018-10-25 DEATH — deceased

## 2020-03-06 IMAGING — DX DG CHEST 1V PORT
1 series · 2 of 2 positions shown · non-contrast
Comparison: Prior radiograph and CT from 05/21/2018

CLINICAL DATA: Initial evaluation for acute respiratory ule
failure, central line placement.

EXAM:
PORTABLE CHEST 1 VIEW

[Series 1: chest ap · 0.14mm/px · 2 of 2 slices shown]
[im 1/2]
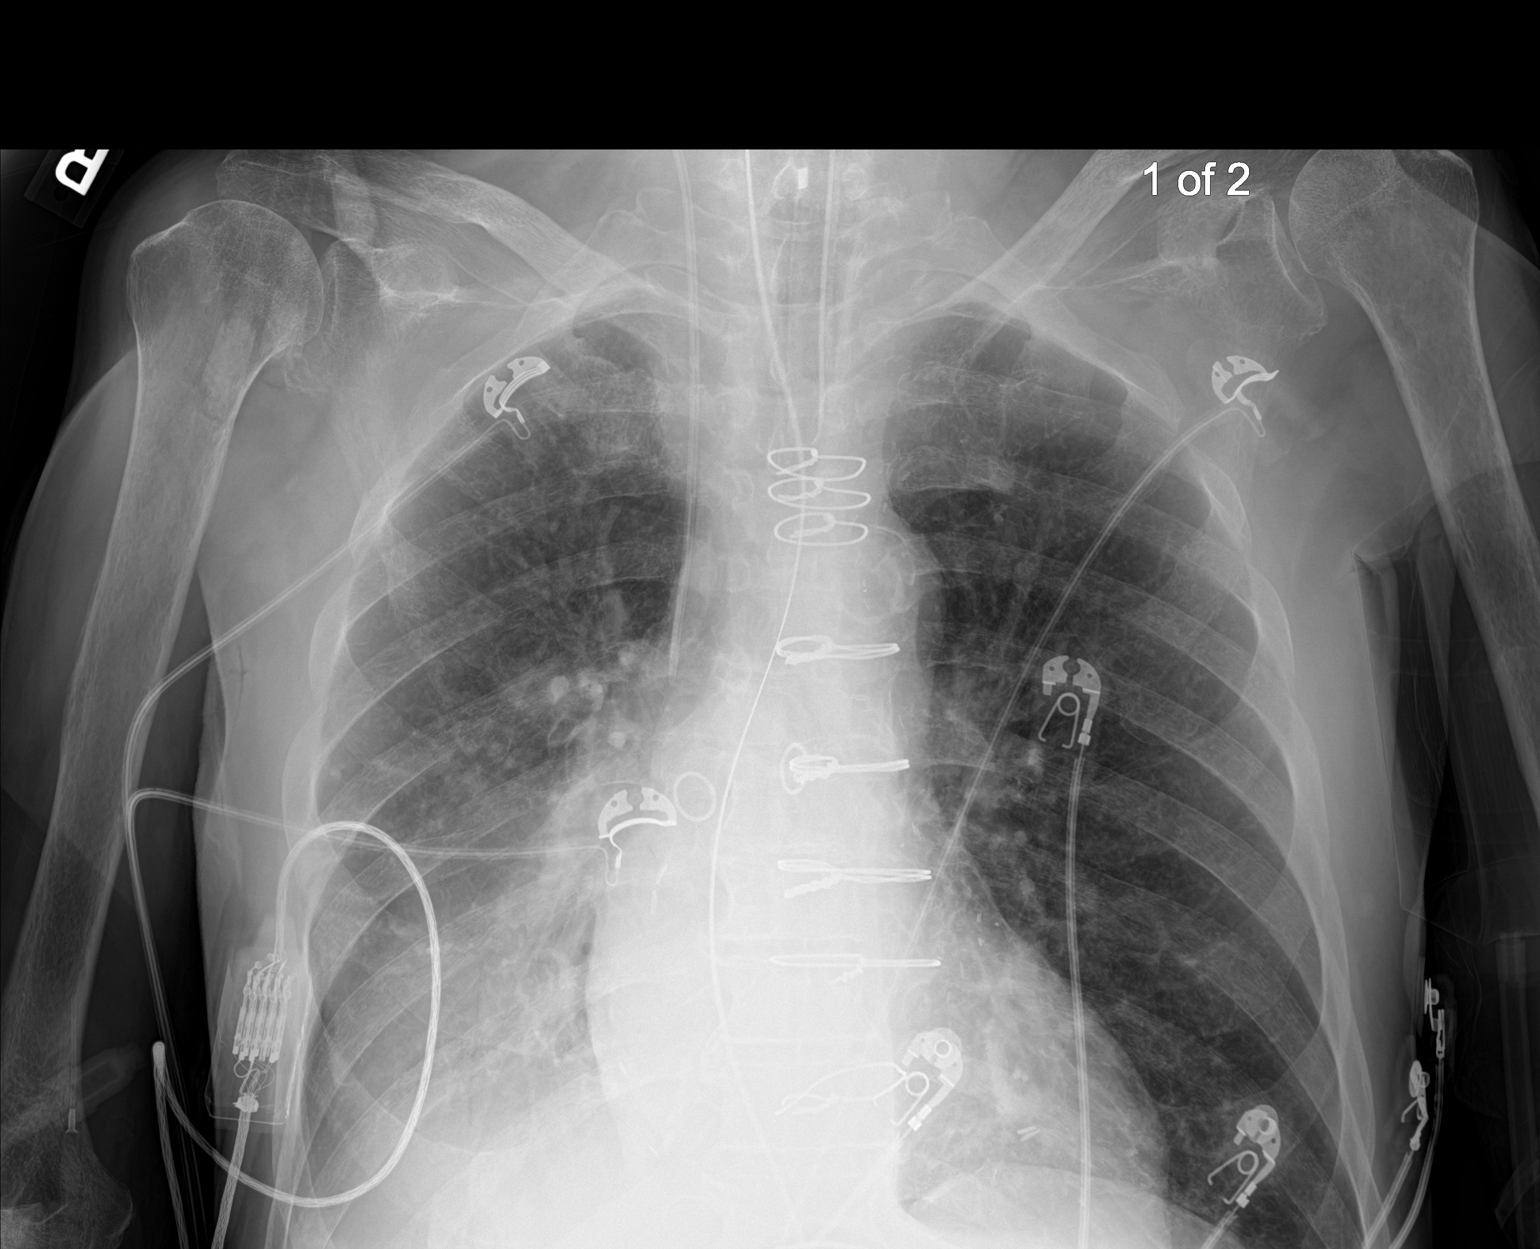
[im 2/2]
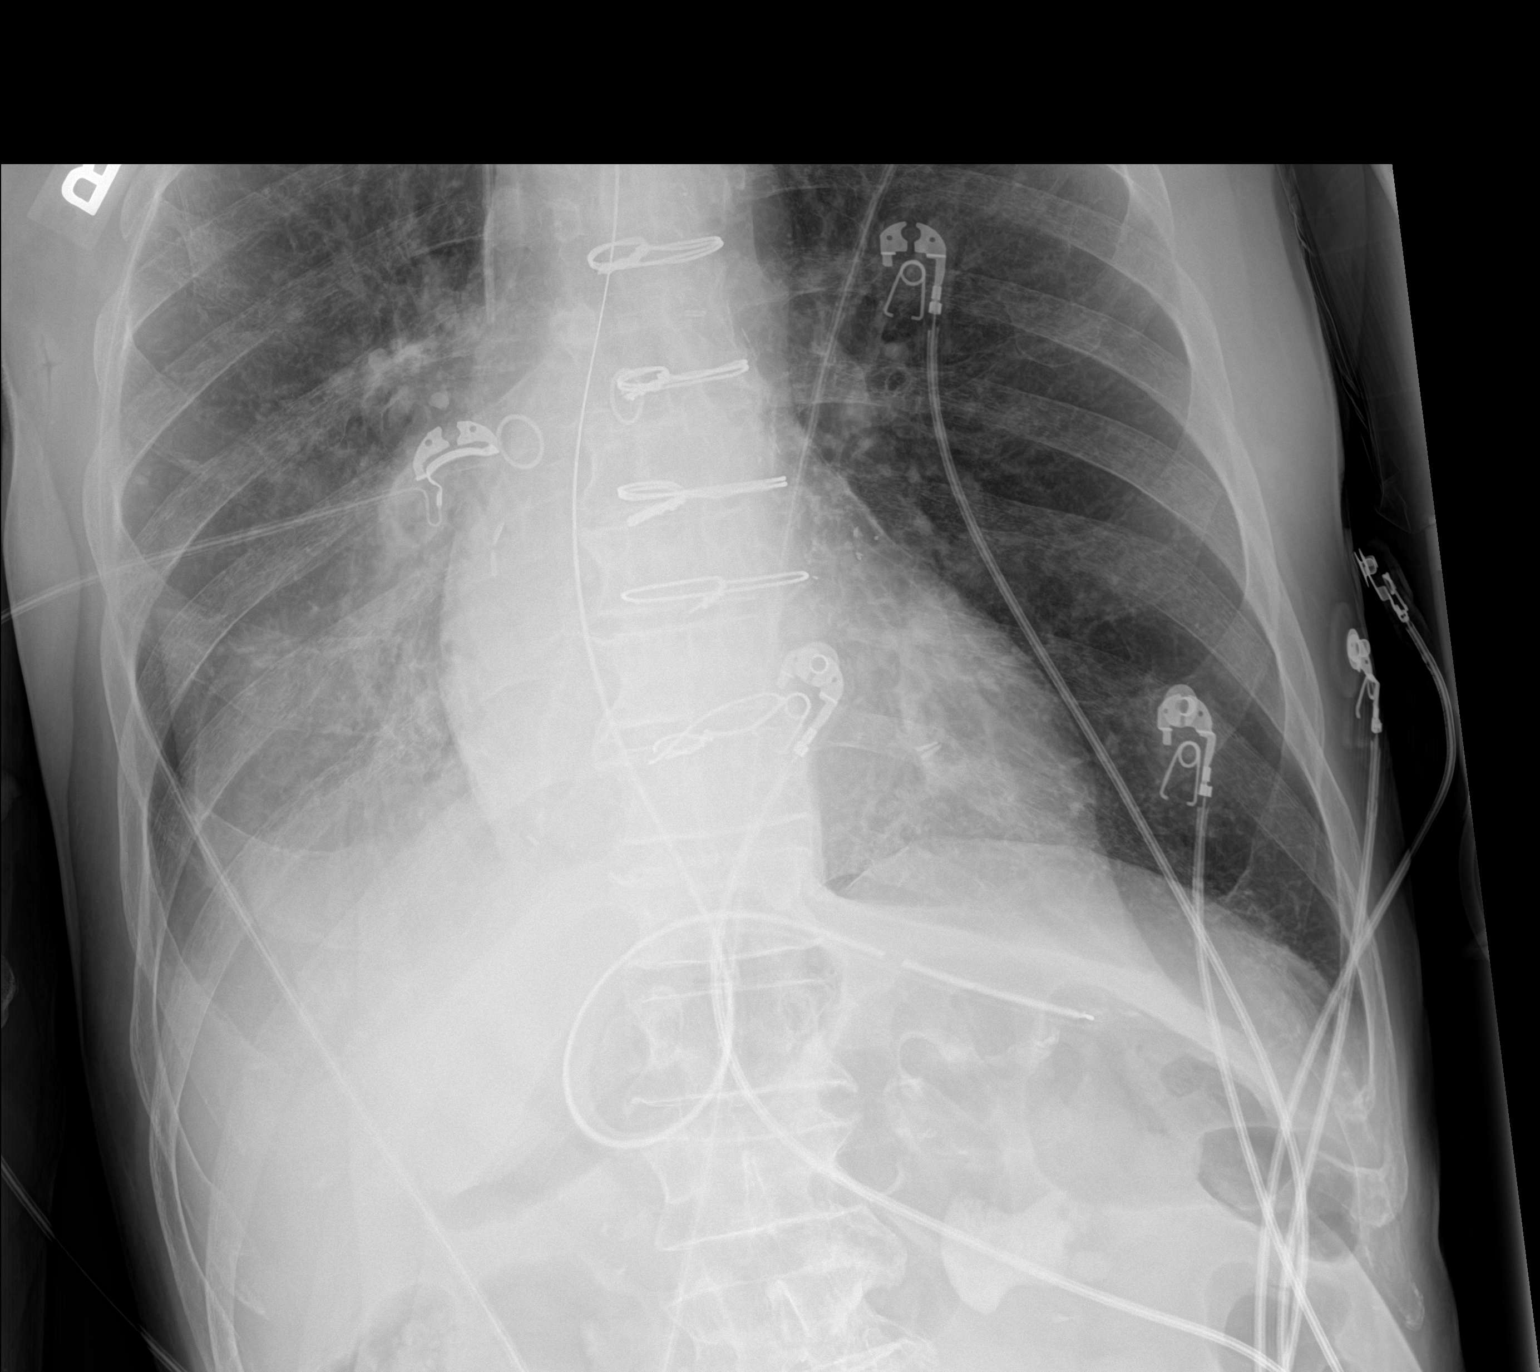

[2 of 2 positions shown; findings below may reference images not displayed]

FINDINGS: Endotracheal tube in place with tip positioned 6.7 cm above the
carina. Enteric tube courses into the abdomen, tip in side hole
overlying the stomach, beyond the GE junction.. Right IJ approach
centra venous catheter tip overlies the mid SVC. Median sternotomy
wires underlying CABG markers noted. Transverse heart size stable,
and remains within normal limits. Mediastinal silhouette normal.
Aortic atherosclerosis.

Failing opacity overlying the right hemidiaphragm consistent with
layering right pleural effusion. Associated right basilar opacity
could reflect atelectasis or infiltrate. Left lung remains largely
clear. No overt pulmonary edema. No pneumothorax.

No acute osseous abnormality.
IMPRESSION: 1. Support apparatus in satisfactory position as above.
2. Moderate layering right pleural effusion with associated right
basilar opacity, could reflect atelectasis or infiltrate.
